# Patient Record
Sex: Male | Born: 1935 | Race: White | Hispanic: No | Marital: Married | Smoking: Former smoker
Health system: Southern US, Community
[De-identification: ages and names within clinical notes are randomized; demographics above are authoritative.]

## PROBLEM LIST (undated history)

## (undated) DIAGNOSIS — M1990 Unspecified osteoarthritis, unspecified site: Secondary | ICD-10-CM

## (undated) DIAGNOSIS — R0602 Shortness of breath: Secondary | ICD-10-CM

## (undated) DIAGNOSIS — I4510 Unspecified right bundle-branch block: Secondary | ICD-10-CM

## (undated) DIAGNOSIS — T7840XA Allergy, unspecified, initial encounter: Secondary | ICD-10-CM

## (undated) DIAGNOSIS — K589 Irritable bowel syndrome without diarrhea: Secondary | ICD-10-CM

## (undated) DIAGNOSIS — N400 Enlarged prostate without lower urinary tract symptoms: Secondary | ICD-10-CM

## (undated) DIAGNOSIS — J45909 Unspecified asthma, uncomplicated: Secondary | ICD-10-CM

## (undated) DIAGNOSIS — N509 Disorder of male genital organs, unspecified: Secondary | ICD-10-CM

## (undated) DIAGNOSIS — K219 Gastro-esophageal reflux disease without esophagitis: Secondary | ICD-10-CM

## (undated) DIAGNOSIS — J449 Chronic obstructive pulmonary disease, unspecified: Secondary | ICD-10-CM

## (undated) DIAGNOSIS — I10 Essential (primary) hypertension: Secondary | ICD-10-CM

## (undated) DIAGNOSIS — M069 Rheumatoid arthritis, unspecified: Secondary | ICD-10-CM

## (undated) DIAGNOSIS — Z87898 Personal history of other specified conditions: Secondary | ICD-10-CM

## (undated) HISTORY — DX: Allergy, unspecified, initial encounter: T78.40XA

## (undated) HISTORY — DX: Chronic obstructive pulmonary disease, unspecified: J44.9

## (undated) HISTORY — PX: TRANSURETHRAL RESECTION OF PROSTATE: SHX73

## (undated) HISTORY — DX: Irritable bowel syndrome, unspecified: K58.9

## (undated) HISTORY — PX: LUMBAR FUSION: SHX111

## (undated) HISTORY — DX: Essential (primary) hypertension: I10

## (undated) HISTORY — DX: Unspecified osteoarthritis, unspecified site: M19.90

## (undated) HISTORY — DX: Benign prostatic hyperplasia without lower urinary tract symptoms: N40.0

## (undated) HISTORY — PX: CARDIOVASCULAR STRESS TEST: SHX262

## (undated) HISTORY — DX: Unspecified asthma, uncomplicated: J45.909

## (undated) HISTORY — DX: Rheumatoid arthritis, unspecified: M06.9

## (undated) HISTORY — DX: Gastro-esophageal reflux disease without esophagitis: K21.9

---

## 2000-03-26 ENCOUNTER — Ambulatory Visit (HOSPITAL_COMMUNITY): Admission: RE | Admit: 2000-03-26 | Discharge: 2000-03-26 | Payer: Self-pay | Admitting: Internal Medicine

## 2000-03-26 ENCOUNTER — Encounter: Payer: Self-pay | Admitting: Internal Medicine

## 2004-08-09 ENCOUNTER — Ambulatory Visit (HOSPITAL_COMMUNITY): Admission: RE | Admit: 2004-08-09 | Discharge: 2004-08-09 | Payer: Self-pay | Admitting: *Deleted

## 2005-11-13 ENCOUNTER — Ambulatory Visit (HOSPITAL_COMMUNITY): Admission: RE | Admit: 2005-11-13 | Discharge: 2005-11-13 | Payer: Self-pay | Admitting: Internal Medicine

## 2008-07-08 ENCOUNTER — Ambulatory Visit (HOSPITAL_COMMUNITY): Admission: RE | Admit: 2008-07-08 | Discharge: 2008-07-08 | Payer: Self-pay | Admitting: Internal Medicine

## 2008-12-24 ENCOUNTER — Inpatient Hospital Stay (HOSPITAL_COMMUNITY): Admission: EM | Admit: 2008-12-24 | Discharge: 2008-12-27 | Payer: Self-pay | Admitting: Emergency Medicine

## 2009-09-28 LAB — HM COLONOSCOPY

## 2009-10-08 ENCOUNTER — Encounter: Admission: RE | Admit: 2009-10-08 | Discharge: 2009-10-08 | Payer: Self-pay | Admitting: Internal Medicine

## 2010-05-13 ENCOUNTER — Encounter
Admission: RE | Admit: 2010-05-13 | Discharge: 2010-05-13 | Payer: Self-pay | Source: Home / Self Care | Attending: Internal Medicine | Admitting: Internal Medicine

## 2010-08-21 LAB — BASIC METABOLIC PANEL
BUN: 15 mg/dL (ref 6–23)
CO2: 28 mEq/L (ref 19–32)
CO2: 29 mEq/L (ref 19–32)
Calcium: 8.5 mg/dL (ref 8.4–10.5)
Chloride: 105 mEq/L (ref 96–112)
Chloride: 107 mEq/L (ref 96–112)
Creatinine, Ser: 1.26 mg/dL (ref 0.4–1.5)
GFR calc Af Amer: >60 mL/min (ref >60)
GFR calc non Af Amer: 56 mL/min — ABNORMAL LOW (ref >60)
GFR calc non Af Amer: >60 mL/min (ref >60)
Glucose, Bld: 112 mg/dL — ABNORMAL HIGH (ref 70–99)
Glucose, Bld: 130 mg/dL — ABNORMAL HIGH (ref 70–99)
Glucose, Bld: 91 mg/dL (ref 70–99)
Potassium: 3.5 mEq/L (ref 3.5–5.1)
Potassium: 4.3 mEq/L (ref 3.5–5.1)
Sodium: 137 mEq/L (ref 135–145)

## 2010-08-21 LAB — URINALYSIS, ROUTINE W REFLEX MICROSCOPIC
Bilirubin Urine: NEGATIVE
Ketones, ur: NEGATIVE mg/dL
Nitrite: NEGATIVE
Nitrite: POSITIVE — AB
Protein, ur: >300 mg/dL — AB
Specific Gravity, Urine: 1.027 (ref 1.005–1.030)
Urobilinogen, UA: 0.2 mg/dL (ref 0.0–1.0)
Urobilinogen, UA: 1 mg/dL (ref 0.0–1.0)

## 2010-08-21 LAB — CBC
HCT: 32.2 % — ABNORMAL LOW (ref 39.0–52.0)
HCT: 33.2 % — ABNORMAL LOW (ref 39.0–52.0)
Hemoglobin: 11.5 g/dL — ABNORMAL LOW (ref 13.0–17.0)
MCHC: 34.4 g/dL (ref 30.0–36.0)
MCHC: 34.8 g/dL (ref 30.0–36.0)
MCV: 92.7 fL (ref 78.0–100.0)
MCV: 93.5 fL (ref 78.0–100.0)
Platelets: 197 10*3/uL (ref 150–400)
RDW: 14.6 % (ref 11.5–15.5)
RDW: 14.6 % (ref 11.5–15.5)
RDW: 14.8 % (ref 11.5–15.5)

## 2010-08-21 LAB — DIFFERENTIAL
Basophils Absolute: 0 10*3/uL (ref 0.0–0.1)
Basophils Absolute: 0.1 10*3/uL (ref 0.0–0.1)
Basophils Relative: 1 % (ref 0–1)
Eosinophils Relative: 0 % (ref 0–5)
Lymphocytes Relative: 13 % (ref 12–46)
Lymphocytes Relative: 7 % — ABNORMAL LOW (ref 12–46)
Monocytes Absolute: 0.8 10*3/uL (ref 0.1–1.0)
Neutro Abs: 15.1 10*3/uL — ABNORMAL HIGH (ref 1.7–7.7)
Neutrophils Relative %: 85 % — ABNORMAL HIGH (ref 43–77)

## 2010-08-21 LAB — IRON AND TIBC
Iron: 33 ug/dL — ABNORMAL LOW (ref 42–135)
TIBC: 221 ug/dL (ref 215–435)
UIBC: 188 ug/dL

## 2010-08-21 LAB — URINE CULTURE: Colony Count: >=100000

## 2010-08-21 LAB — CULTURE, BLOOD (ROUTINE X 2): Culture: NO GROWTH

## 2010-08-21 LAB — FERRITIN: Ferritin: 299 ng/mL (ref 22–322)

## 2010-08-21 LAB — URINE MICROSCOPIC-ADD ON

## 2010-08-21 LAB — RETICULOCYTES: RBC.: 3.37 MIL/uL — ABNORMAL LOW (ref 4.22–5.81)

## 2010-09-27 NOTE — Discharge Summary (Signed)
[name], [name]                 ACCOUNT NO.:  [id number]   MEDICAL RECORD NO.:  [id number]          PATIENT TYPE:  INP   LOCATION:  1512                         FACILITY:  [facility]   PHYSICIAN:  Ladell Pier, M.D.   DATE OF BIRTH:  1931-02-02   DATE OF ADMISSION:  12/24/2008  DATE OF DISCHARGE:  12/27/2008                               DISCHARGE SUMMARY   DISCHARGE DIAGNOSES:  1. Urinary tract infection.  2. Hypokalemia.  3. Hematuria.  4. Immunocompromised state on immunosuppressive therapy.  5. Hypertension.  6. Question of pneumonia.  7. Leukocytosis.  8. Anemia of chronic disease.  9. Rheumatoid arthritis.  10.Liver cysts.   DISCHARGE MEDICATIONS:  1. Levaquin 500 mg daily x6 days.  2. Methotrexate 10 mg every Monday.  3. Prednisone 10 mg daily.  4. Losartan 100 mg daily.  5. Terazosin 10 mg daily.  6. Folic acid 1 mg daily.  7. Vitamin D 50,000 international units Monday, Wednesday and Friday.  8. Finasteride 5 mg daily.   FOLLOWUP APPOINTMENTS:  The patient is to follow up with Dr. Oneta Rack in  1 week.   PROCEDURE:  None.   CONSULTANTS:  Dr. Aldean Ast, curbside consultant.   DIAGNOSTIC STUDIES:  CT scan of the abdomen and pelvis showed a region  of heterogenicity in the right lobe of the liver by ultrasound, showed  no abnormality on CT scan, findings presumably related to geographic  fatty infiltration, multiple cysts are present in the lateral segment of  the left lobe of the liver.  These are generally benign.  Given the  multitude of the cysts present as well as the size of the dominant cyst  it would be helpful to obtain at least a followup imaging study in 6-12  months.  Ultrasound of the kidneys showed normal renal ultrasound, no  hydronephrosis, nonspecific echogenic liver lesion could reflect focal  fat or hemangioma, neoplasm is not excluded.  Followup CT scan  recommended.  Chest x-ray done on August 12 showed interval development  of atelectasis or  infiltrate in the right middle lobe and the lingula.   HISTORY OF PRESENT ILLNESS:  The patient is a 77 year old white male  with a longstanding history of BPH who presented to the hospital with  fevers with temperatures up to 103 and chills for the past 2 days.  He  was noted to have some hematuria that morning.  The patient complains of  urinary frequency and decreased urine output for several days.  Please  see the admission note for the remainder of history done by Dr.  Ashley Royalty.  Past medical history, family history, social history, meds,  allergies, review of systems per admission H and P.   DISCHARGE PHYSICAL EXAMINATION:  VITAL SIGNS:  Temperature is 97.7,  pulse of 56, respirations 18, blood pressure 121/73, pulse ox 98% on  room air.  GENERAL:  The patient is sitting up in chair and does not seem to be in  any acute distress.  HEENT:  Head is normocephalic, atraumatic.  Pupils reactive to light.  Throat without erythema.  HEART:  Regular  rate and rhythm.  LUNGS:  Lungs are clear bilaterally.  No wheezes, rhonchi or rales.  ABDOMEN:  Soft, nontender and nondistended.  Positive bowel sounds.  EXTREMITIES:  Extremities without edema.   HOSPITAL COURSE:  1. Urinary tract infection.  The patient was admitted to the hospital.      He was started on IV Rocephin and Zithromax to cover both bladder      infection and pneumonia.  His fever resolved and he is at present      doing well.  He will follow up with Dr. Oneta Rack in the office at      some time after the clearing of the infection to repeat the      urinalysis.  His urine culture grew out Proteus mirabilis that is      sensitive to Levaquin and also sensitive to ceftriaxone with which      he was treated with.  2. Leukocytosis.  He did have leukocytosis on presentation that could      have been secondary to the prednisone and also the infection.      Leukocytosis is now resolved.  3. Anemia.  He does have anemia.  His lab  work showed that he does      have anemia of chronic disease.  4. Liver cysts.  He had some liver cysts that showed up on the      ultrasound and recommendation was for followup CT.  CT showed      multiple cysts and the recommendation is to follow up repeat CAT      scan in 6-12 months.  5. Hypertension.  He was continued on his home medications and his      blood pressure fairly well-controlled.  6. Hematuria.  He states that he does have a history of hematuria for      which he sees Dr. Aldean Ast.  He will follow up outpatient for the      hematuria at this time.  Since he does have the urinary tract      infection it is hard to tell whether the blood in the urine is      really because of bladder infection or from his chronic hematuria.  7. Pneumonia.  It does show possible pneumonia on his chest x-ray but      he does not presently have any respiratory symptoms but the      Levaquin also covers pneumonia and bladder infection.   DISCHARGE LABS:  UA too numerous to count white and red blood cells, a  large amount of blood and moderate leukocytes, sodium 141, potassium  3.5, chloride 107, CO2 28, glucose 91, BUN 15, creatinine 1.09, calcium  8.3, WBC 10.3, hemoglobin 11, platelets 197, MCV 93.5.  Anemia panel  retic count 30.3, iron 33, TIBC 221, percent sat 15.  Vitamin B12 level  462, folate greater than 20 and ferritin 299, blood cultures are  negative x2.      Ladell Pier, M.D.  Electronically Signed     NJ/MEDQ  D:  12/27/2008  T:  12/27/2008  Job:  [id number]   cc:   Lucky Cowboy, M.D.  Fax: [phone]

## 2010-09-27 NOTE — H&P (Signed)
NAMEJULES, [name]                 ACCOUNT NO.:  [id number]   MEDICAL RECORD NO.:  [id number]          PATIENT TYPE:  INP   LOCATION:  1512                         FACILITY:  [facility]   PHYSICIAN:  Altha Harm, MDDATE OF BIRTH:  26-Feb-1931   DATE OF ADMISSION:  12/24/2008  DATE OF DISCHARGE:                              HISTORY & PHYSICAL   CHIEF COMPLAINT:  Fever and chills x2 days.   HISTORY OF PRESENT ILLNESS:  This is a 77 year old gentleman with a  longstanding history of benign prostatic hypertrophy, who presents to  the hospital with fever, T-max of 103, and chills x2 days.  According to  the patient's family, he was also noted to have some hematuria this  morning.  The patient reports that he has been having frequency and  urgency and decreased urine output for several days.  He does not give a  history of decreased oral intake; however, his urine output has  decreased.  The patient denies any cough.  He denies any abdominal pain.  He denies any diarrhea, any constipation.  He denies any dizziness,  seizure activity, loss of consciousness, chest pain.  Of note, the  patient was recently started on methotrexate approximately 4 weeks ago  for treatment of his rheumatoid arthritis.  The patient has never had a  urinary tract infection in the past and this has been confirmed by his  urologist, Dr. Aldean Ast.   PAST MEDICAL HISTORY:  1. Significant for rheumatoid arthritis.  2. Benign prostatic hypertrophy, status post TURP.  3. Hypertension.   FAMILY HISTORY:  Significant for colon cancer in the father.   SOCIAL HISTORY:  The patient resides with his wife.  He is retired.  He  denies any tobacco, alcohol or drug use.   CURRENT MEDICATIONS:  1. Methotrexate 10 mg p.o. weekly on Mondays.  2. Prednisone 10 mg p.o. daily.  3. Losartan 100 mg p.o. daily.  4. Terazosin 10 mg p.o. daily.  5. Folic acid 1 mg p.o. daily.  6. Vitamin D 50,000 units Monday, Wednesday and  Friday.  7. Finasteride 5 mg p.o. daily.   ALLERGIES:  PENICILLIN.   PRIMARY CARE PHYSICIAN:  Dr. Oneta Rack.   UROLOGIST:  Dr. Aldean Ast.   RHEUMATOLOGIST:  Dr. Nickola Major.   REVIEW OF SYSTEMS:  Ten systems were reviewed.  All systems are negative  except as noted in the HPI.   Review of records:  I spoke with Dr. Aldean Ast, who reports that the  patient was seen in his office 2 weeks ago and had cytology done at that  time, which was negative.  He states that his hematuria is a  longstanding problem and has always been present.  He usually has about  10-15 rbc's in the urine.  He has not had a recent renal ultrasound  performed in the office.   Studies in the emergency room show the following:  White blood cell  count of 17.7, hemoglobin of 12.6, hematocrit of 36.6, platelet count of  219.  Sodium 137, potassium 3.4, chloride 104, bicarb 28, BUN 21,  creatinine 1.26.  Urinalysis shows positive nitrites, leukocytes, wbc's  too numerous to count, rbc's too numerous to count.   A chest x-ray 2-view shows interval development of atelectasis or  infiltrate in the right middle lobe and lingula.   PHYSICAL EXAMINATION:  On presentation to the emergency room, the  patient had a temperature of 99.2, a blood pressure of 98/61, a  respiratory rate of 18 and a heart rate of 68, O2 saturations of 99% on  room air.  Presently the vital signs are as follows.  Temperature of  98.5, heart rate 88, blood pressure 135/75, respiratory rate of 20, O2  saturations of 97% on room air.  GENERAL:  The patient is lying in bed.  He is a well-nourished, well-  developed gentleman in no acute distress.  HEENT EXAMINATION:  He is normocephalic, atraumatic.  Pupils are equally  round and reactive to light and accommodation.  Extraocular movements  are intact.  Oropharynx is moist.  No exudate, erythema or lesions are  noted.  NECK EXAMINATION:  Trachea is midline.  No masses, no thyromegaly, no  JVD, no  carotid bruit.  RESPIRATORY EXAMINATION:  The patient has a normal respiratory effort.  He has got equal excursion bilaterally.  No wheezing or rhonchi are  noted.  He is clear to auscultation.  No increased vocal fremitus.  He  is resonant to percussion.  CARDIOVASCULAR:  He has got a normal S1 and S2.  No murmurs, rubs or  gallops are noted.  PMI is nondisplaced.  No heaves or thrills on  palpation.  ABDOMINAL EXAMINATION:  The abdomen is obese, protuberant.  He has  normoactive bowel sounds.  Abdomen is nontender, nondistended.  No  masses, no hepatosplenomegaly noted.  He has got no CVA tenderness noted  LYMPH NODE SURGERY:  He has got no cervical, axillary or inguinal  lymphadenopathy noted.  MUSCULOSKELETAL:  He has got no warmth, swelling or erythema around the  joints.  NEUROLOGICAL:  He has got cranial nerves II-XII grossly intact.  No  focal neurological deficits noted.  PSYCHIATRIC:  He is alert and oriented x3.  He has got normal cognition,  good insight.  He has got good remote recall.  He has got very mild  impairment of recent recall.   ASSESSMENT/PLAN:  This is a patient who presents with:  1. A urinary tract infection with sepsis associated with the urinary      tract infection.  There is a questionable pneumonia versus      atelectasis.  Clinically the patient does not present as a      pneumonia; however, that will be evaluated as the patient continues      his current therapy.  2. Hypokalemia.  This will be replaced.  3. Hematuria.  As noted before in consultation with Dr. Aldean Ast, the      patient does have microscopic hematuria.  However, in the setting      of a urinary tract infection, it can certainly increase the amount      of red blood cells seen in the urine.  The patient has had a recent      cytology less than 2 weeks ago which is negative.  The plan in      light of the urinary tract infection and decreasing output and the      hematuria, I will go  ahead and get a renal ultrasound for this      patient.  We will evaluate for any  evidence of hydronephrosis or      if, in fact, his decreased urine output is secondary to decreased      volume status.  In terms of his urinary tract infection, the      patient is presently on antibiotics, Rocephin and azithromycin, to      also cover pneumonia.  The patient does have an allergy to      PENICILLIN, and there is some cross-reactivity between PENICILLIN      and Rocephin.  I will go ahead leave the patient on Rocephin as he      appears to be tolerating it without any sequelae.  If he does start      to develop any symptoms, it will be prudent to change him to an      alternate antibiotic, possibly ciprofloxacin.  In the meantime, we      will await the results of the cultures and proceed with tapering      antibiotics as appropriate.  Based upon the findings of the renal      ultrasound and based upon the patient's urine output, we will make      a determination as to whether or not a Foley needs to be placed.      However, at this time a Foley is not in place and I do not intend      to do one unless there is an indication to do so.  In terms of his      hypokalemia, his potassium will be replaced.  4. Immunocompromised state.  The patient was recently started on      methotrexate and likely put him in an immunocompromised state, thus      to predispose him to infection.  He has already received his      methotrexate for this week.  However, if he is in the hospital      beyond next Monday, it would be prudent to hold the methotrexate      until he is recovered from his illness and then restart it once he      is in a convalescent state.  In terms of the prednisone, that will      be continued while hospitalized.  5. Hypertension.  The patient be continued on his Cozaar.  His blood      pressure is normotensive at present.  I have discussed with Dr.      Aldean Ast and I have also  discussed it with the patient's daughter,      Dulcy Fanny, a nurse in the hospital.   TOTAL TIME FOR THIS PROCESS:  1 hour.      Altha Harm, MD  Electronically Signed     MAM/MEDQ  D:  12/24/2008  T:  12/24/2008  Job:  [id number]

## 2010-09-30 NOTE — Op Note (Signed)
[name], [name]                 ACCOUNT NO.:  [id number]   MEDICAL RECORD NO.:  [id number]          PATIENT TYPE:  AMB   LOCATION:  ENDO                         FACILITY:  [facility]   PHYSICIAN:  John C. Madilyn Fireman, M.D.    DATE OF BIRTH:  [birth year]   DATE OF PROCEDURE:  08/09/2004  DATE OF DISCHARGE:                                 OPERATIVE REPORT   PROCEDURE:  Esophagogastroduodenoscopy with esophageal dilatation.   INDICATIONS FOR PROCEDURE:  Intermittent solid food dysphagia in a patient  who is also undergoing colonoscopy due to the family history of colon cancer  in a first-degree relative.   PROCEDURE:  The patient was placed in the left lateral decubitus position  and placed on the pulse monitor with continuous low-flow oxygen delivered by  nasal cannula.  He was sedated with 50 mcg IV fentanyl and 7.5 mg IV Versed.  The Olympus video endoscope was advanced under direct vision into the  oropharynx and esophagus.  The esophagus was straight and of normal caliber  with the squamocolumnar line at 38 cm.  There was no visible hiatal hernia,  ring stricture or other abnormality of the GE junction.  The stomach was  entered without resistance and a small amount of liquid secretions were  suctioned from the fundus.  Retroflexed view of the cardia was unremarkable.  The fundus, body, antrum and pylorus all appeared normal.  The duodenum was  entered and both the bulb and second portion were well-inspected and  appeared be within normal limits.  The Savary guidewire was passed through  the endoscope channel and the scope withdrawn.  Under fluoroscopic  visualization an 18 mm Savary dilator was passed with minimal resistance and  no blood seen on withdrawal.  The dilator was removed together with wire and  the patient prepared for colonoscopy.  He tolerated the procedure well, and  there were no immediate complications.   IMPRESSION:  Normal endoscopy with no visible esophageal ring or  stricture,  status post empiric dilatation due to his complaints of dysphagia.   PLAN:  1.  Advance diet and observe response to dilatation.  2.  A proceed colonoscopy.      JCH/MEDQ  D:  08/09/2004  T:  08/09/2004  Job:  [id number]   cc:   Lucky Cowboy, M.D.  [address]  [place], [place] [address]  Fax: [phone]

## 2010-09-30 NOTE — Op Note (Signed)
NAMERANVIR, RENOVATO                 ACCOUNT NO.:  [id number]   MEDICAL RECORD NO.:  [id number]          PATIENT TYPE:  AMB   LOCATION:  ENDO                         FACILITY:  [facility]   PHYSICIAN:  John C. Madilyn Fireman, M.D.    DATE OF BIRTH:  May 28, 1930   DATE OF PROCEDURE:  08/09/2004  DATE OF DISCHARGE:                                 OPERATIVE REPORT   PROCEDURE:  Colonoscopy with polypectomy   INDICATIONS FOR PROCEDURE:  Family history of colon cancer in a first-degree  relative.   DESCRIPTION OF PROCEDURE:  The patient was placed in the left lateral  decubitus position and placed on the pulse monitor with continuous low-flow  oxygen delivered by nasal cannula. He was sedated with 50 mcg IV Fentanyl  and 7.5 mg IV Versed for previous EGD; and no further sedation was required  for this procedure.   The Olympus video colonoscope was inserted into the rectum and advanced to  cecum, confirmed by transillumination of McBurney's point and visualization  of ileocecal valve and appendiceal orifice. The prep was fairly good, but  suboptimal in some locations particularly the transverse and descending  colon. I could not rule out small lesions less than 1 cm in all areas;  otherwise, there was a 8-mm, pedunculated polyp in the base of the cecum  which was removed by snare.  I lost position during snaring the polyp and  the polyp was lost to visualization and subsequent retrieval, despite  vigorous lavage and suctioning of the fluid in the surrounding areas of the  lumen.  The polypectomy site was well inspected and appeared to be free of  any residual polyp.   The remainder of the cecum, ascending, transverse, descending sigmoid, and  rectum appeared normal with no further polyps, masses, diverticula, or other  mucosal abnormalities. The rectum likewise appeared normal.  On retroflexed  view the anus revealed no obvious internal hemorrhoids. The scope was then  withdrawn and the patient  returned to the recovery room in stable condition.  He tolerated the procedure well; and there were no immediate complications.   IMPRESSION:  Cecal polyp, otherwise normal study.   PLAN:  Will repeat colonoscopy in 5 years.      JCH/MEDQ  D:  08/09/2004  T:  08/09/2004  Job:  [id number]   cc:   Lucky Cowboy, M.D.  [address]  [place], [place] [address]  Fax: [phone]

## 2011-03-30 ENCOUNTER — Ambulatory Visit (HOSPITAL_COMMUNITY)
Admission: RE | Admit: 2011-03-30 | Discharge: 2011-03-30 | Disposition: A | Discharge status: Home / Self Care | Payer: Medicare Other | Source: Ambulatory Visit | Attending: Internal Medicine | Admitting: Internal Medicine

## 2011-03-30 ENCOUNTER — Other Ambulatory Visit (HOSPITAL_COMMUNITY): Payer: Self-pay | Admitting: Internal Medicine

## 2011-03-30 DIAGNOSIS — I10 Essential (primary) hypertension: Secondary | ICD-10-CM

## 2011-03-30 DIAGNOSIS — J449 Chronic obstructive pulmonary disease, unspecified: Secondary | ICD-10-CM

## 2011-03-30 DIAGNOSIS — J4489 Other specified chronic obstructive pulmonary disease: Secondary | ICD-10-CM | POA: Insufficient documentation

## 2011-03-30 DIAGNOSIS — R0602 Shortness of breath: Secondary | ICD-10-CM | POA: Insufficient documentation

## 2011-04-20 ENCOUNTER — Ambulatory Visit (INDEPENDENT_AMBULATORY_CARE_PROVIDER_SITE_OTHER): Payer: Medicare Other | Admitting: Cardiology

## 2011-04-20 ENCOUNTER — Encounter: Payer: Self-pay | Admitting: Cardiology

## 2011-04-20 VITALS — BP 118/68 | HR 88 | Ht 76.0 in | Wt 262.0 lb

## 2011-04-20 DIAGNOSIS — R002 Palpitations: Secondary | ICD-10-CM

## 2011-04-20 DIAGNOSIS — R0600 Dyspnea, unspecified: Secondary | ICD-10-CM

## 2011-04-20 DIAGNOSIS — R0609 Other forms of dyspnea: Secondary | ICD-10-CM

## 2011-04-20 DIAGNOSIS — R0989 Other specified symptoms and signs involving the circulatory and respiratory systems: Secondary | ICD-10-CM

## 2011-04-20 DIAGNOSIS — M069 Rheumatoid arthritis, unspecified: Secondary | ICD-10-CM

## 2011-04-20 DIAGNOSIS — I119 Hypertensive heart disease without heart failure: Secondary | ICD-10-CM

## 2011-04-20 NOTE — Assessment & Plan Note (Signed)
The blood pressure has been adequately controlled on his current regimen.

## 2011-04-20 NOTE — Assessment & Plan Note (Signed)
The patient has exertional dyspnea relieved by rest.  He is not having any significant cough or sputum production.  We checked his oxygen saturation at rest today and it was normal at 93% on room air.  He had a recent chest x-ray on 03/30/11 which showed stable changes of COPD but no new focal findings and the heart size was at the upper limits of normal.

## 2011-04-20 NOTE — Assessment & Plan Note (Signed)
Patient is aware of rapid forceful heart action which accompanies his exertional dyspnea and subsides with rest.

## 2011-04-20 NOTE — Patient Instructions (Signed)
Your physician recommends that you continue on your current medications as directed. Please refer to the Current Medication list given to you today.  Your physician has requested that you have an echocardiogram. Echocardiography is a painless test that uses sound waves to create images of your heart. It provides your doctor with information about the size and shape of your heart and how well your heart's chambers and valves are working. This procedure takes approximately one hour. There are no restrictions for this procedure.  Your physician has requested that you have en exercise stress myoview. For further information please visit [email or web address]. Please follow instruction sheet, as given.   

## 2011-04-20 NOTE — Progress Notes (Signed)
[name] Date of Birth:  [birth year] [facility] / [facility] [address]   [address] [place], [place]  [address] [phone]           Fax   [phone]  History of Present Illness: This pleasant 77 year old gentleman is seen at the request of Dr. Oneta Rack for evaluation of dyspnea.  He had seen the patient in more than 10 years ago in our office but those records are not able to be found today.  We both recalled that he had a treadmill stress test and he recalls being told that it was normal at that time.  Recently the patient has been more short of breath and has become concerned.  He has also been experiencing palpitations.  He's had lightheadedness at times.  He denies any chest pain.  His dyspnea has gradually worsened since his retirement.  Over that period of time he has gained about 15 pounds.  He is a nonsmoker having quit 30 years ago.  He does not consume any alcohol.  He has not been experiencing any symptoms of overt congestive heart failure such as orthopnea or paroxysmal nocturnal dyspnea.  The symptoms are mainly with exertion.  He estimates that he is able to climb 2 flights of stairs but would be quite short of breath at the top and his heart would be racing.  Likewise he would be able to walk a city block but would be tired at the end of one block.  His past medical history reveals that he had a history of high blood pressure and a history of rheumatoid arthritis.  Dr. Lendon Colonel is his rheumatologist. His past surgical history reveals that he has a past history of BPH and several years ago had a TURP by Dr. Aldean Ast.  His family history reveals that his father died at age 78 of heart trouble and his mother lived to be 44  Current Outpatient Prescriptions  Medication Sig Dispense Refill  . Cholecalciferol ([employer] VITAMIN D PO) Take by mouth. Taking 50,000 units 4 times a week       . finasteride (PROSCAR) 5 MG tablet Take 5 mg by mouth daily.        . folic acid  (FOLVITE) 1 MG tablet Take 1 mg by mouth daily.        Marland Kitchen losartan (COZAAR) 100 MG tablet Take 100 mg by mouth daily.        . methotrexate (RHEUMATREX) 2.5 MG tablet Take 2.5 mg by mouth once a week. Taking 6 tab weekly       . Multiple Vitamin (MULTIVITAMIN) tablet Take 1 tablet by mouth daily.        . predniSONE (DELTASONE) 5 MG tablet Take 5 mg by mouth daily.        Marland Kitchen terazosin (HYTRIN) 10 MG capsule Take 10 mg by mouth at bedtime.          Allergies  Allergen Reactions  . Penicillins     Patient Active Problem List  Diagnoses  . Dyspnea  . Benign hypertensive heart disease without heart failure  . Palpitation  . Rheumatoid arthritis    History  Smoking status  . Former Smoker  Smokeless tobacco  . Not on file    History  Alcohol Use: Not on file    No family history on file.  Review of Systems: Constitutional: no fever chills diaphoresis or fatigue or change in weight.  Head and neck: no hearing loss, no epistaxis,  no photophobia or visual disturbance. Respiratory: No cough, shortness of breath or wheezing. Cardiovascular: No chest pain peripheral edema, palpitations. Gastrointestinal: No abdominal distention, no abdominal pain, no change in bowel habits hematochezia or melena. Genitourinary: No dysuria, no frequency, no urgency, no nocturia. Musculoskeletal:No arthralgias, no back pain, no gait disturbance or myalgias. Neurological: No dizziness, no headaches, no numbness, no seizures, no syncope, no weakness, no tremors. Hematologic: No lymphadenopathy, no easy bruising. Psychiatric: No confusion, no hallucinations, no sleep disturbance.    Physical Exam: Filed Vitals:   04/20/11 1457  BP: 118/68  Pulse: 88   the general appearance reveals a large gentleman in no distress.Pupils equal and reactive.   Extraocular Movements are full.  There is no scleral icterus.  The mouth and pharynx are normal.  The neck is supple.  The carotids reveal no bruits.  The  jugular venous pressure is normal.  The thyroid is not enlarged.  There is no lymphadenopathy.  The chest is clear to percussion and auscultation. There are no rales or rhonchi. Expansion of the chest is symmetrical.  The precordium is quiet.  The first heart sound is normal.  The second heart sound is physiologically split.  There is no murmur gallop rub or click.  There is no abnormal lift or heave.  The abdomen is soft and nontender. Bowel sounds are normal. The liver and spleen are not enlarged. There Are no abdominal masses. There are no bruits.  The pedal pulses are good.  There is no phlebitis or edema.  There is no cyanosis or clubbing. Strength is normal and symmetrical in all extremities.  There is no lateralizing weakness.  There are no sensory deficits.  The skin is warm and dry.  There is no rash.    Assessment / Plan: We reviewed his recent electrocardiogram which shows normal sinus rhythm and a right bundle branch block pattern but no ischemic changes at rest. The etiology of his exertional dyspnea is not presently clear.  He does have rheumatoid arthritis and may have some cardiac involvement from that.  We will have him return for a two-dimensional echocardiogram.  We'll also have him return for a treadmill Myoview stress test to evaluate his exertional dyspnea further.  Many thanks for the opportunity to see this pleasant gentleman with you again  [name]

## 2011-04-21 ENCOUNTER — Encounter: Payer: Self-pay | Admitting: Cardiology

## 2011-04-21 ENCOUNTER — Encounter: Payer: Self-pay | Admitting: *Deleted

## 2011-04-26 ENCOUNTER — Ambulatory Visit (HOSPITAL_BASED_OUTPATIENT_CLINIC_OR_DEPARTMENT_OTHER): Payer: Medicare Other | Admitting: Radiology

## 2011-04-26 ENCOUNTER — Ambulatory Visit (HOSPITAL_COMMUNITY): Payer: Medicare Other | Attending: Internal Medicine

## 2011-04-26 DIAGNOSIS — I079 Rheumatic tricuspid valve disease, unspecified: Secondary | ICD-10-CM | POA: Insufficient documentation

## 2011-04-26 DIAGNOSIS — R0989 Other specified symptoms and signs involving the circulatory and respiratory systems: Secondary | ICD-10-CM | POA: Insufficient documentation

## 2011-04-26 DIAGNOSIS — M069 Rheumatoid arthritis, unspecified: Secondary | ICD-10-CM

## 2011-04-26 DIAGNOSIS — I119 Hypertensive heart disease without heart failure: Secondary | ICD-10-CM

## 2011-04-26 DIAGNOSIS — I4949 Other premature depolarization: Secondary | ICD-10-CM

## 2011-04-26 DIAGNOSIS — I4510 Unspecified right bundle-branch block: Secondary | ICD-10-CM

## 2011-04-26 DIAGNOSIS — I059 Rheumatic mitral valve disease, unspecified: Secondary | ICD-10-CM | POA: Insufficient documentation

## 2011-04-26 DIAGNOSIS — R002 Palpitations: Secondary | ICD-10-CM

## 2011-04-26 DIAGNOSIS — R0600 Dyspnea, unspecified: Secondary | ICD-10-CM

## 2011-04-26 DIAGNOSIS — R0609 Other forms of dyspnea: Secondary | ICD-10-CM | POA: Insufficient documentation

## 2011-04-26 HISTORY — PX: TRANSTHORACIC ECHOCARDIOGRAM: SHX275

## 2011-04-26 MED ORDER — TECHNETIUM TC 99M TETROFOSMIN IV KIT
10.0000 | PACK | Freq: Once | INTRAVENOUS | Status: AC | PRN
  Administered 2011-04-26: 10 via INTRAVENOUS

## 2011-04-26 MED ORDER — TECHNETIUM TC 99M TETROFOSMIN IV KIT
30.0000 | PACK | Freq: Once | INTRAVENOUS | Status: AC | PRN
  Administered 2011-04-26: 30 via INTRAVENOUS

## 2011-04-26 NOTE — Progress Notes (Signed)
[facility] SITE 3 NUCLEAR MED [address] [place] [address] [phone]  Cardiology Nuclear Med Study  [name] is a 77 y.o. male [id number] June 20, 1930   Nuclear Med Background Indication for Stress Test:  Evaluation for Ischemia History:  COPD and GXT -NL Cardiac Risk Factors: History of Smoking, Hypertension and RBBB  Symptoms:  Dizziness, DOE, Fatigue, Light-Headedness and Palpitations   Nuclear Pre-Procedure Caffeine/Decaff Intake:  None NPO After: 9:00pm   Lungs:  clear IV 0.9% NS with Angio Cath:  20g  IV Site: R Antecubital  IV Started by:  Bonnita Levan, RN  Chest Size (in):  48 Cup Size: n/a  Height: 6\' 4"  (1.93 m)  Weight:  264 lb (119.75 kg)  BMI:  Body mass index is 32.14 kg/(m^2). Tech Comments:  N/A    Nuclear Med Study 1 or 2 day study: 1 day  Stress Test Type:  Stress  Reading MD: Kristeen Miss, MD  Order Authorizing Provider:  [name], MD  Resting Radionuclide: Technetium 67m Tetrofosmin  Resting Radionuclide Dose: 11 mCi   Stress Radionuclide:  Technetium 60m Tetrofosmin  Stress Radionuclide Dose: 33 mCi           Stress Protocol Rest HR: 54 Stress HR: 133  Rest BP: 112/60 Stress BP: 161/56  Exercise Time (min): 5:45 mins METS: 7.00   Predicted Max HR: 142 bpm % Max HR: 93.66 bpm Rate Pressure Product: 21308   Dose of Adenosine (mg):  n/a Dose of Lexiscan: n/a mg  Dose of Atropine (mg): n/a Dose of Dobutamine: n/a mcg/kg/min (at max HR)  Stress Test Technologist: Frederick Peers, EMT-P  Nuclear Technologist:  Domenic Polite, CNMT     Rest Procedure:  Myocardial perfusion imaging was performed at rest 45 minutes following the intravenous administration of Technetium 87m Tetrofosmin. Rest ECG: NSR-RBBB  Stress Procedure:  The patient exercised for 5:24mins.  The patient stopped due to SOB and denied any chest pain.  There were non specific ST-T wave changes/PVCs.  Technetium 38m Tetrofosmin was injected at  peak exercise and myocardial perfusion imaging was performed after a brief delay. Stress ECG: No significant change from baseline ECG  QPS Raw Data Images:  Normal; no motion artifact; normal heart/lung ratio. Stress Images:  Mildly decreased uptake in the base of the inferior wall Rest Images:  Moderately decreased uptake in the base to mid inferior wall Subtraction (SDS):  There is decreased uptake in the inferior wall which is worse on the rest images and improves with stress. Suspect this is soft-tissue attenuation. No evidence ischemia.  Transient Ischemic Dilatation (Normal <1.22):  .87 Lung/Heart Ratio (Normal <0.45):  43  Quantitative Gated Spect Images QGS EDV:  105 ml QGS ESV:  40 ml QGS cine images:  NL LV Function; NL Wall Motion QGS EF: 62%  Impression Exercise Capacity:  Fair exercise capacity. BP Response:  Normal blood pressure response. Clinical Symptoms:  There is dyspnea. ECG Impression:  No significant ST segment change suggestive of ischemia. Comparison with Prior Nuclear Study: No images to compare  Overall Impression:  Low risk stress nuclear study. There is decreased uptake in the inferior wall which is worse on the rest images and improves with stress. Suspect this is soft-tissue attenuation. No evidence ischemia.   Truman Hayward 6:13 PM

## 2011-04-27 ENCOUNTER — Telehealth: Payer: Self-pay | Admitting: *Deleted

## 2011-04-27 NOTE — Telephone Encounter (Signed)
Advised of results

## 2011-04-27 NOTE — Telephone Encounter (Signed)
Message copied by Burnell Blanks on Thu Apr 27, 2011 10:16 AM ------      Message from: [name]      Created: Wed Apr 26, 2011  7:14 PM       Please report.  The echocardiogram was satisfactory.  The left ventricular function is good.  There is mild diastolic dysfunction and mild mitral regurgitation.  Continue same medication.

## 2011-04-27 NOTE — Telephone Encounter (Signed)
Message copied by Burnell Blanks on Thu Apr 27, 2011 10:15 AM ------      Message from: [name]      Created: Thu Apr 27, 2011  9:22 AM       Please report.  The stress test was normal.  No evidence of ischemia.  Left ventricular function is good.

## 2011-07-04 ENCOUNTER — Other Ambulatory Visit (HOSPITAL_COMMUNITY): Payer: Self-pay | Admitting: Internal Medicine

## 2011-07-04 DIAGNOSIS — J449 Chronic obstructive pulmonary disease, unspecified: Secondary | ICD-10-CM

## 2011-07-13 ENCOUNTER — Inpatient Hospital Stay (HOSPITAL_COMMUNITY)
Admission: RE | Admit: 2011-07-13 | Discharge: 2011-07-13 | Disposition: A | Discharge status: Home / Self Care | Payer: Medicare Other | Source: Ambulatory Visit | Attending: Internal Medicine | Admitting: Internal Medicine

## 2011-07-13 DIAGNOSIS — M069 Rheumatoid arthritis, unspecified: Secondary | ICD-10-CM | POA: Insufficient documentation

## 2011-07-13 DIAGNOSIS — Z880 Allergy status to penicillin: Secondary | ICD-10-CM | POA: Insufficient documentation

## 2011-07-13 DIAGNOSIS — R0989 Other specified symptoms and signs involving the circulatory and respiratory systems: Secondary | ICD-10-CM | POA: Insufficient documentation

## 2011-07-13 DIAGNOSIS — Z87891 Personal history of nicotine dependence: Secondary | ICD-10-CM | POA: Insufficient documentation

## 2011-07-13 DIAGNOSIS — R0609 Other forms of dyspnea: Secondary | ICD-10-CM | POA: Insufficient documentation

## 2011-07-13 DIAGNOSIS — R002 Palpitations: Secondary | ICD-10-CM | POA: Insufficient documentation

## 2011-07-13 DIAGNOSIS — J449 Chronic obstructive pulmonary disease, unspecified: Secondary | ICD-10-CM | POA: Insufficient documentation

## 2011-07-13 DIAGNOSIS — I119 Hypertensive heart disease without heart failure: Secondary | ICD-10-CM | POA: Insufficient documentation

## 2011-07-13 DIAGNOSIS — J4489 Other specified chronic obstructive pulmonary disease: Secondary | ICD-10-CM | POA: Insufficient documentation

## 2012-04-04 ENCOUNTER — Emergency Department (HOSPITAL_COMMUNITY): Payer: Medicare Other

## 2012-04-04 ENCOUNTER — Emergency Department (HOSPITAL_COMMUNITY)
Admission: EM | Admit: 2012-04-04 | Discharge: 2012-04-04 | Disposition: A | Discharge status: Home / Self Care | Payer: Medicare Other | Attending: Emergency Medicine | Admitting: Emergency Medicine

## 2012-04-04 ENCOUNTER — Encounter (HOSPITAL_COMMUNITY): Payer: Self-pay | Admitting: *Deleted

## 2012-04-04 DIAGNOSIS — S46909A Unspecified injury of unspecified muscle, fascia and tendon at shoulder and upper arm level, unspecified arm, initial encounter: Secondary | ICD-10-CM | POA: Insufficient documentation

## 2012-04-04 DIAGNOSIS — S0081XA Abrasion of other part of head, initial encounter: Secondary | ICD-10-CM

## 2012-04-04 DIAGNOSIS — Z79899 Other long term (current) drug therapy: Secondary | ICD-10-CM | POA: Insufficient documentation

## 2012-04-04 DIAGNOSIS — Y929 Unspecified place or not applicable: Secondary | ICD-10-CM | POA: Insufficient documentation

## 2012-04-04 DIAGNOSIS — S0990XA Unspecified injury of head, initial encounter: Secondary | ICD-10-CM

## 2012-04-04 DIAGNOSIS — Z87448 Personal history of other diseases of urinary system: Secondary | ICD-10-CM | POA: Insufficient documentation

## 2012-04-04 DIAGNOSIS — I10 Essential (primary) hypertension: Secondary | ICD-10-CM | POA: Insufficient documentation

## 2012-04-04 DIAGNOSIS — Z87891 Personal history of nicotine dependence: Secondary | ICD-10-CM | POA: Insufficient documentation

## 2012-04-04 DIAGNOSIS — S060X1A Concussion with loss of consciousness of 30 minutes or less, initial encounter: Secondary | ICD-10-CM | POA: Insufficient documentation

## 2012-04-04 DIAGNOSIS — IMO0002 Reserved for concepts with insufficient information to code with codable children: Secondary | ICD-10-CM | POA: Insufficient documentation

## 2012-04-04 DIAGNOSIS — Y9389 Activity, other specified: Secondary | ICD-10-CM | POA: Insufficient documentation

## 2012-04-04 DIAGNOSIS — S4980XA Other specified injuries of shoulder and upper arm, unspecified arm, initial encounter: Secondary | ICD-10-CM | POA: Insufficient documentation

## 2012-04-04 DIAGNOSIS — M069 Rheumatoid arthritis, unspecified: Secondary | ICD-10-CM | POA: Insufficient documentation

## 2012-04-04 DIAGNOSIS — W1801XA Striking against sports equipment with subsequent fall, initial encounter: Secondary | ICD-10-CM | POA: Insufficient documentation
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
## 2012-04-04 NOTE — ED Notes (Signed)
Patient ambulatory to CT

## 2012-04-04 NOTE — ED Notes (Signed)
Patient transported returned from  CT

## 2012-07-02 ENCOUNTER — Other Ambulatory Visit: Payer: Self-pay | Admitting: Urology

## 2012-07-03 ENCOUNTER — Other Ambulatory Visit (HOSPITAL_COMMUNITY): Payer: Self-pay | Admitting: Urology

## 2012-07-03 ENCOUNTER — Other Ambulatory Visit: Payer: Self-pay | Admitting: Urology

## 2012-07-03 DIAGNOSIS — L729 Follicular cyst of the skin and subcutaneous tissue, unspecified: Secondary | ICD-10-CM

## 2012-07-03 DIAGNOSIS — D4959 Neoplasm of unspecified behavior of other genitourinary organ: Secondary | ICD-10-CM

## 2012-07-05 ENCOUNTER — Ambulatory Visit (HOSPITAL_COMMUNITY)
Admission: RE | Admit: 2012-07-05 | Discharge: 2012-07-05 | Disposition: A | Discharge status: Home / Self Care | Payer: Medicare Other | Source: Ambulatory Visit | Attending: Urology | Admitting: Urology

## 2012-07-05 DIAGNOSIS — N433 Hydrocele, unspecified: Secondary | ICD-10-CM | POA: Insufficient documentation

## 2012-07-05 DIAGNOSIS — D4959 Neoplasm of unspecified behavior of other genitourinary organ: Secondary | ICD-10-CM

## 2012-07-05 DIAGNOSIS — N508 Other specified disorders of male genital organs: Secondary | ICD-10-CM | POA: Insufficient documentation

## 2012-07-05 DIAGNOSIS — L729 Follicular cyst of the skin and subcutaneous tissue, unspecified: Secondary | ICD-10-CM

## 2012-07-11 ENCOUNTER — Encounter (HOSPITAL_BASED_OUTPATIENT_CLINIC_OR_DEPARTMENT_OTHER): Payer: Self-pay | Admitting: *Deleted

## 2012-07-11 NOTE — Progress Notes (Signed)
NPO AFTER MN. ARRIVES AT 0830. NEEDS ISTAT AND EKG. WILL TAKE PREDNISONE AM OF SURG W/ SIP OF WATER.

## 2012-07-17 ENCOUNTER — Ambulatory Visit (HOSPITAL_BASED_OUTPATIENT_CLINIC_OR_DEPARTMENT_OTHER)
Admission: RE | Admit: 2012-07-17 | Discharge: 2012-07-17 | Disposition: A | Discharge status: Home / Self Care | Payer: Medicare Other | Source: Ambulatory Visit | Attending: Urology | Admitting: Urology

## 2012-07-17 ENCOUNTER — Encounter (HOSPITAL_BASED_OUTPATIENT_CLINIC_OR_DEPARTMENT_OTHER): Payer: Self-pay | Admitting: Certified Registered"

## 2012-07-17 ENCOUNTER — Encounter (HOSPITAL_BASED_OUTPATIENT_CLINIC_OR_DEPARTMENT_OTHER): Payer: Self-pay | Admitting: Anesthesiology

## 2012-07-17 ENCOUNTER — Encounter (HOSPITAL_BASED_OUTPATIENT_CLINIC_OR_DEPARTMENT_OTHER)
Admission: RE | Disposition: A | Discharge status: Home / Self Care | Payer: Self-pay | Source: Ambulatory Visit | Attending: Urology

## 2012-07-17 ENCOUNTER — Ambulatory Visit (HOSPITAL_BASED_OUTPATIENT_CLINIC_OR_DEPARTMENT_OTHER): Payer: Medicare Other | Admitting: Anesthesiology

## 2012-07-17 DIAGNOSIS — I10 Essential (primary) hypertension: Secondary | ICD-10-CM | POA: Insufficient documentation

## 2012-07-17 DIAGNOSIS — I4510 Unspecified right bundle-branch block: Secondary | ICD-10-CM | POA: Insufficient documentation

## 2012-07-17 DIAGNOSIS — N509 Disorder of male genital organs, unspecified: Secondary | ICD-10-CM

## 2012-07-17 DIAGNOSIS — M069 Rheumatoid arthritis, unspecified: Secondary | ICD-10-CM | POA: Insufficient documentation

## 2012-07-17 DIAGNOSIS — N400 Enlarged prostate without lower urinary tract symptoms: Secondary | ICD-10-CM | POA: Insufficient documentation

## 2012-07-17 DIAGNOSIS — C632 Malignant neoplasm of scrotum: Secondary | ICD-10-CM | POA: Insufficient documentation

## 2012-07-17 HISTORY — DX: Personal history of other specified conditions: Z87.898

## 2012-07-17 HISTORY — DX: Shortness of breath: R06.02

## 2012-07-17 HISTORY — DX: Unspecified right bundle-branch block: I45.10

## 2012-07-17 HISTORY — PX: SCROTAL EXPLORATION: SHX2386

## 2012-07-17 HISTORY — DX: Disorder of male genital organs, unspecified: N50.9

## 2012-07-17 LAB — POCT I-STAT 4, (NA,K, GLUC, HGB,HCT)
HCT: 39 % (ref 39.0–52.0)
Hemoglobin: 13.3 g/dL (ref 13.0–17.0)

## 2012-07-17 SURGERY — EXPLORATION, SCROTUM
Anesthesia: Monitor Anesthesia Care | Site: Scrotum | Laterality: Left | Wound class: Clean Contaminated

## 2012-07-17 MED ORDER — PROPOFOL 10 MG/ML IV EMUL
INTRAVENOUS | Status: DC | PRN
  Administered 2012-07-17: 75 ug/kg/min via INTRAVENOUS

## 2012-07-17 MED ORDER — BACITRACIN-NEOMYCIN-POLYMYXIN 400-5-5000 EX OINT: TOPICAL_OINTMENT | Freq: Three times a day (TID) | CUTANEOUS | Status: DC

## 2012-07-17 MED ORDER — BACITRACIN-NEOMYCIN-POLYMYXIN OINTMENT TUBE
TOPICAL_OINTMENT | CUTANEOUS | Status: DC | PRN
  Administered 2012-07-17: 1 via TOPICAL

## 2012-07-17 MED ORDER — HYDROCODONE-ACETAMINOPHEN 5-325 MG PO TABS: 1.0000 | ORAL_TABLET | ORAL | Status: DC | PRN

## 2012-07-17 MED ORDER — LACTATED RINGERS IV SOLN
INTRAVENOUS | Status: DC
  Administered 2012-07-17: 09:00:00 via INTRAVENOUS
  Filled 2012-07-17: qty 1000

## 2012-07-17 MED ORDER — SENNOSIDES-DOCUSATE SODIUM 8.6-50 MG PO TABS: 1.0000 | ORAL_TABLET | Freq: Two times a day (BID) | ORAL | Status: DC

## 2012-07-17 MED ORDER — CIPROFLOXACIN IN D5W 400 MG/200ML IV SOLN
400.0000 mg | Freq: Two times a day (BID) | INTRAVENOUS | Status: DC
Start: 2012-07-17 — End: 2012-07-17
  Administered 2012-07-17: 400 mg via INTRAVENOUS
  Filled 2012-07-17: qty 200

## 2012-07-17 MED ORDER — FENTANYL CITRATE 0.05 MG/ML IJ SOLN
INTRAMUSCULAR | Status: DC | PRN
  Administered 2012-07-17: 25 ug via INTRAVENOUS
  Administered 2012-07-17: 50 ug via INTRAVENOUS
  Administered 2012-07-17: 25 ug via INTRAVENOUS

## 2012-07-17 MED ORDER — BUPIVACAINE HCL (PF) 0.25 % IJ SOLN
INTRAMUSCULAR | Status: DC | PRN
  Administered 2012-07-17: 30 mL

## 2012-07-17 SURGICAL SUPPLY — 47 items
ADH SKN CLS APL DERMABOND .7 (GAUZE/BANDAGES/DRESSINGS) ×1
BANDAGE GAUZE ELAST BULKY 4 IN (GAUZE/BANDAGES/DRESSINGS) ×2 IMPLANT
BLADE HEX COATED 2.75 (ELECTRODE) ×2 IMPLANT
BLADE SURG 10 STRL SS (BLADE) IMPLANT
BLADE SURG 15 STRL LF DISP TIS (BLADE) ×1 IMPLANT
BLADE SURG 15 STRL SS (BLADE) ×2
CLOTH BEACON ORANGE TIMEOUT ST (SAFETY) ×2 IMPLANT
COVER MAYO STAND STRL (DRAPES) ×2 IMPLANT
COVER TABLE BACK 60X90 (DRAPES) ×2 IMPLANT
DERMABOND ADVANCED (GAUZE/BANDAGES/DRESSINGS) ×1
DERMABOND ADVANCED .7 DNX12 (GAUZE/BANDAGES/DRESSINGS) ×1 IMPLANT
DRAIN PENROSE 18X1/4 LTX STRL (WOUND CARE) IMPLANT
DRAPE PED LAPAROTOMY (DRAPES) ×2 IMPLANT
ELECT REM PT RETURN 9FT ADLT (ELECTROSURGICAL) ×2
ELECTRODE REM PT RTRN 9FT ADLT (ELECTROSURGICAL) ×1 IMPLANT
GAUZE SPONGE 4X4 12PLY STRL LF (GAUZE/BANDAGES/DRESSINGS) ×1 IMPLANT
GLOVE BIO SURGEON STRL SZ7.5 (GLOVE) ×1 IMPLANT
GLOVE BIOGEL PI IND STRL 7.0 (GLOVE) IMPLANT
GLOVE BIOGEL PI IND STRL 7.5 (GLOVE) ×1 IMPLANT
GLOVE BIOGEL PI INDICATOR 7.0 (GLOVE) ×1
GLOVE BIOGEL PI INDICATOR 7.5 (GLOVE) ×1
GLOVE ECLIPSE 7.0 STRL STRAW (GLOVE) ×1 IMPLANT
GLOVE ECLIPSE 7.5 STRL STRAW (GLOVE) ×2 IMPLANT
GOWN PREVENTION PLUS LG XLONG (DISPOSABLE) ×2 IMPLANT
GOWN PREVENTION PLUS XLARGE (GOWN DISPOSABLE) ×2 IMPLANT
NEEDLE HYPO 22GX1.5 SAFETY (NEEDLE) ×2 IMPLANT
NS IRRIG 500ML POUR BTL (IV SOLUTION) ×2 IMPLANT
PACK BASIN DAY SURGERY FS (CUSTOM PROCEDURE TRAY) ×2 IMPLANT
PENCIL BUTTON HOLSTER BLD 10FT (ELECTRODE) ×2 IMPLANT
SPONGE LAP 4X18 X RAY DECT (DISPOSABLE) IMPLANT
SUPPORT SCROTAL LG STRP (MISCELLANEOUS) ×2 IMPLANT
SUT CHROMIC 2 0 SH (SUTURE) IMPLANT
SUT CHROMIC 3 0 SH 27 (SUTURE) ×4 IMPLANT
SUT CHROMIC 4 0 SH 27 (SUTURE) ×2 IMPLANT
SUT MNCRL AB 4-0 PS2 18 (SUTURE) IMPLANT
SUT SILK 0 SH 30 (SUTURE) IMPLANT
SUT SILK 0 TIES 10X30 (SUTURE) IMPLANT
SUT VIC AB 2-0 SH 27 (SUTURE)
SUT VIC AB 2-0 SH 27XBRD (SUTURE) IMPLANT
SUT VIC AB 2-0 UR5 27 (SUTURE) IMPLANT
SUT VIC AB 3-0 SH 27 (SUTURE) ×2
SUT VIC AB 3-0 SH 27X BRD (SUTURE) IMPLANT
SUT VICRYL 0 TIES 12 18 (SUTURE) IMPLANT
SYR CONTROL 10ML LL (SYRINGE) IMPLANT
TOWEL OR 17X24 6PK STRL BLUE (TOWEL DISPOSABLE) ×4 IMPLANT
TRAY DSU PREP LF (CUSTOM PROCEDURE TRAY) ×2 IMPLANT
WATER STERILE IRR 500ML POUR (IV SOLUTION) IMPLANT

## 2012-07-17 NOTE — Transfer of Care (Signed)
Immediate Anesthesia Transfer of Care Note  Patient: [name]  Procedure(s) Performed: Procedure(s) (LRB):  EXCISION  OF LEFT SCROTAL SKIN LESION (Left)  Patient Location: PACU  Anesthesia Type: MAC  Level of Consciousness: awake, alert , oriented and patient cooperative  Airway & Oxygen Therapy: Patient Spontanous Breathing and Patient connected to face mask oxygen  Post-op Assessment: Report given to PACU RN and Post -op Vital signs reviewed and stable  Post vital signs: Reviewed and stable  Complications: No apparent anesthesia complications

## 2012-07-17 NOTE — H&P (Signed)
Urology History and Physical Exam  CC: Left scrotal neoplasm  HPI: 79 year old male presents today for excision of scrotal neoplasm. Ths has been present for 6-12 months. It is not associated with drainage. It is 1 cm in size. It is essentially unchanged from our office visit. It is located on the left lateral posterior scrotum. It is erythematous with heaped up edges. It is not tender to palpation. He was seen by her dermatologist, Dr. Londell Moh, who was concerned it appears possibly cancerous. It has not had any drainage.  We have discuss excision and her prefers to have sedation for the excision rather than do this in the clinic under local anesthetic. We have discussed the risks, benefits, alternatives, and likelihood of achieving goals.  PMH: Past Medical History  Diagnosis Date  . Rheumatoid arthritis     DR. HAWKES  . BPH (benign prostatic hypertrophy)   . Hypertension   . Scrotal lesion   . Short of breath on exertion   . RBBB   . History of urinary retention     PSH: Past Surgical History  Procedure Laterality Date  . Transurethral resection of prostate  SEVERAL YRS AGO  . Transthoracic echocardiogram  04-26-2011      MILD LVH/ LVSF NORMAL/ EF 60-65%/ GRADE I DIASTOLIC DYSFUNCTION/ MILD MITRAL REGURG.  . Cardiovascular stress test  04-26-2011  DR BRACKBILL    LOW RISK STRESS NUCLEAR STUDY/ NO EVIDENCE ISCHEMIA  . Lumbar fusion  X3  LAST ONE 1996    Allergies: Allergies  Allergen Reactions  . Penicillins Hives    Medications: No prescriptions prior to admission     Social History: History   Social History  . Marital Status: Married    Spouse Name: N/A    Number of Children: N/A  . Years of Education: N/A   Occupational History  . RETIRED    Social History Main Topics  . Smoking status: Former Smoker    Quit date: 07/11/1980  . Smokeless tobacco: Never Used  . Alcohol Use: No  . Drug Use: No  . Sexually Active: Not on file   Other Topics Concern  .  Not on file   Social History Narrative  . No narrative on file    Family History: Family History  Problem Relation Age of Onset  . Heart disease Father   . Colon cancer Father     Review of Systems: Positive: None. Negative: Fever, chest pain, or SOB.  A further 10 point review of systems was negative except what is listed in the HPI.  Physical Exam: Filed Vitals:   07/17/12 0806  BP: 140/82  Pulse: 55  Temp: 96.8 F (36 C)  Resp: 20  . General: No acute distress.  Awake. Head:  Normocephalic.  Atraumatic. ENT:  EOMI.  Mucous membranes moist Neck:  Supple.  No lymphadenopathy. CV:  S1 present. S2 present. Regular rate. Pulmonary: Equal effort bilaterally.  Clear to auscultation bilaterally. Abdomen: Soft.  Non- tender to palpation. Skin:  Normal turgor.  No visible rash. Extremity: No gross deformity of bilateral upper extremities.  No gross deformity of    bilateral lower extremities. Neurologic: Alert. Appropriate mood.  Genitourinary: Penis negative for lesions. Scrotum with 10-40mm erythematous lesion on posterior lateral left scrotum which is soft and mobile. Negative lymphadenopathy of the inguinal & femoral lymph nodes. .  Studies:  No results found for this basename: HGB, WBC, PLT,  in the last 72 hours  No results found for  this basename: NA, K, CL, CO2, BUN, CREATININE, CALCIUM, MAGNESIUM, GFRNONAA, GFRAA,  in the last 72 hours   No results found for this basename: PT, INR, APTT,  in the last 72 hours   No components found with this basename: ABG,     Assessment:  Left Scrotal neoplasm  Plan: Excision of scrotal neoplasm

## 2012-07-17 NOTE — Anesthesia Preprocedure Evaluation (Signed)
Anesthesia Evaluation  Patient identified by MRN, date of birth, ID band Patient awake    Reviewed: Allergy & Precautions, H&P , NPO status , Patient's Chart, lab work & pertinent test results  Airway Mallampati: II TM Distance: >3 FB Neck ROM: Full    Dental no notable dental hx.    Pulmonary shortness of breath,  breath sounds clear to auscultation  Pulmonary exam normal       Cardiovascular hypertension, Pt. on medications + dysrhythmias Rhythm:Regular Rate:Normal  Low risk stress test 2012.  RBBB   Neuro/Psych negative neurological ROS  negative psych ROS   GI/Hepatic negative GI ROS, Neg liver ROS,   Endo/Other  negative endocrine ROS  Renal/GU negative Renal ROS  negative genitourinary   Musculoskeletal  (+) Arthritis -,   Abdominal   Peds negative pediatric ROS (+)  Hematology negative hematology ROS (+)   Anesthesia Other Findings   Reproductive/Obstetrics negative OB ROS                           Anesthesia Physical Anesthesia Plan  ASA: III  Anesthesia Plan: MAC   Post-op Pain Management:    Induction: Intravenous  Airway Management Planned:   Additional Equipment:   Intra-op Plan:   Post-operative Plan:   Informed Consent: I have reviewed the patients History and Physical, chart, labs and discussed the procedure including the risks, benefits and alternatives for the proposed anesthesia with the patient or authorized representative who has indicated his/her understanding and acceptance.   Dental advisory given  Plan Discussed with: CRNA  Anesthesia Plan Comments:         Anesthesia Quick Evaluation

## 2012-07-17 NOTE — Op Note (Signed)
Urology Operative Report  Date of Procedure: 07/17/12  Surgeon: Natalia Leatherwood, MD Assistant: None  Preoperative Diagnosis: Left scrotal lesion Postoperative Diagnosis:  Same  Procedure(s): Excision of left scrotal lesion (1.1 cm)  Estimated blood loss: Minimal  Specimen: Left scrotal lesion with margin sent for pathology  Drains: None  Complications: None  Findings: Left scrotal lesion: 1.1 cm in size. Not fixed to deep tissue.  History of present illness: 79 year old male presents today with a left scrotal lesion. This was noted by his dermatologist who was concerned that it could possibly be cancerous. I am also concerned. Patient presents today for excision of this lesion.   Procedure in detail: After informed consent was obtained, the patient was taken to the operating room. They were placed in the supine position. SCDs were turned on and in place. IV antibiotics were infused, and IV sedation anesthesia was induced. A timeout was performed in which the correct patient, surgical site, and procedure were identified and agreed upon by the team.  The patient was placed in a supine position, making sure to pad all pertinent neurovascular pressure points. The hair was removed from his genitals which were prepped and draped in the usual sterile fashion.  I identified the lesion which was located on the posterior lateral portion of the scrotum. It does not feel to be adherent to deeper tissue. I measured this lesion with a ruler and measured 1.1 cm in size. I then marked margins around the lesion of 1 cm surrounding the lesion. I injected the skin around the lesion and along the planned incision sites with quarter percent Marcaine. I then made an elliptical incision with a 15 blade scalpel. A needle point Bovie electrocautery was then used to dissect out the lesion. Once underneath the lesion, it did not feel that it was adherent to any tissue. I did remove some dartos tissue with the  excision. After this, meticulous hemostasis was carried out with Bovie electrocautery. The specimen was sent to pathology. I irrigated the incision with copious amounts of sterile normal saline. I then closed the chart does in 2 layers. The first layer I used a running locking 3-0 Vicryl suture. The second layer of dartos I used an interrupted figure-of-eight 3-0 Vicryl suture.  The skin was closed with interrupted horizontal mattress sutures of 4-0 Monocryl. I then injected the remainder of the quarter percent Marcaine for total of 30 cc injected. Bacitracin ointment was placed over the incision and then sterile 4 x 4's and fluff gauze were placed. I again inspected his femoral and inguinal lymph nodes under sedation and these were negative to palpation for enlargement or firmness. The sedation was reversed and the patient was taken to the PACU in a stable condition.   I discussed the surgical findings and the course of surgery with the patient and his family.  All counts were correct.

## 2012-07-18 ENCOUNTER — Encounter (HOSPITAL_BASED_OUTPATIENT_CLINIC_OR_DEPARTMENT_OTHER): Payer: Self-pay | Admitting: Urology

## 2012-07-18 NOTE — Anesthesia Postprocedure Evaluation (Signed)
  Anesthesia Post-op Note  Patient: [name]  Procedure(s) Performed: Procedure(s) (LRB):  EXCISION  OF LEFT SCROTAL SKIN LESION (Left)  Patient Location: PACU  Anesthesia Type: MAC  Level of Consciousness: awake and alert   Airway and Oxygen Therapy: Patient Spontanous Breathing  Post-op Pain: mild  Post-op Assessment: Post-op Vital signs reviewed, Patient's Cardiovascular Status Stable, Respiratory Function Stable, Patent Airway and No signs of Nausea or vomiting  Last Vitals:  Filed Vitals:   07/17/12 1148  BP: 149/80  Pulse: 49  Temp: 36.1 C  Resp: 16    Post-op Vital Signs: stable   Complications: No apparent anesthesia complications

## 2012-09-10 ENCOUNTER — Other Ambulatory Visit: Payer: Self-pay | Admitting: Dermatology

## 2012-12-25 ENCOUNTER — Other Ambulatory Visit (HOSPITAL_COMMUNITY): Payer: Self-pay | Admitting: Urology

## 2012-12-25 DIAGNOSIS — D4959 Neoplasm of unspecified behavior of other genitourinary organ: Secondary | ICD-10-CM

## 2013-01-07 ENCOUNTER — Other Ambulatory Visit: Payer: Self-pay | Admitting: Dermatology

## 2013-01-23 ENCOUNTER — Ambulatory Visit (HOSPITAL_COMMUNITY)
Admission: RE | Admit: 2013-01-23 | Discharge: 2013-01-23 | Disposition: A | Discharge status: Home / Self Care | Payer: Medicare Other | Source: Ambulatory Visit | Attending: Urology | Admitting: Urology

## 2013-01-23 DIAGNOSIS — D4959 Neoplasm of unspecified behavior of other genitourinary organ: Secondary | ICD-10-CM

## 2013-01-23 DIAGNOSIS — N433 Hydrocele, unspecified: Secondary | ICD-10-CM | POA: Insufficient documentation

## 2013-01-23 DIAGNOSIS — N508 Other specified disorders of male genital organs: Secondary | ICD-10-CM | POA: Insufficient documentation

## 2013-01-23 DIAGNOSIS — I861 Scrotal varices: Secondary | ICD-10-CM | POA: Insufficient documentation

## 2013-04-04 ENCOUNTER — Encounter: Payer: Self-pay | Admitting: Internal Medicine

## 2013-04-04 DIAGNOSIS — J449 Chronic obstructive pulmonary disease, unspecified: Secondary | ICD-10-CM | POA: Insufficient documentation

## 2013-04-04 DIAGNOSIS — K219 Gastro-esophageal reflux disease without esophagitis: Secondary | ICD-10-CM | POA: Insufficient documentation

## 2013-04-04 DIAGNOSIS — J45909 Unspecified asthma, uncomplicated: Secondary | ICD-10-CM | POA: Insufficient documentation

## 2013-04-04 DIAGNOSIS — M1990 Unspecified osteoarthritis, unspecified site: Secondary | ICD-10-CM | POA: Insufficient documentation

## 2013-04-04 DIAGNOSIS — N400 Enlarged prostate without lower urinary tract symptoms: Secondary | ICD-10-CM | POA: Insufficient documentation

## 2013-04-04 DIAGNOSIS — K589 Irritable bowel syndrome without diarrhea: Secondary | ICD-10-CM | POA: Insufficient documentation

## 2013-04-07 ENCOUNTER — Encounter: Payer: Self-pay | Admitting: Internal Medicine

## 2013-04-07 NOTE — Progress Notes (Signed)
Patient ID: [name], male   DOB: 09-14-1930, 79 y.o.   MRN: [id number]  Annual Screening Comprehensive Examination  This very nice 79 yo MWM presents for complete physical.  Patient has been followed for HTN,  Prediabetes, Hyperlipidemia, COPD/asthma and vitamin D Deficiency.   Patient's BP has been controlled at home. Today's BP is 140/64.  He had neg. Stress Myoview in January 2013 (Dr Patty Sermons) and PFT's showed mild COPD consistant with his c/o chronic dyspnea. Patient denies any cardiac symptoms as chest pain, palpitations,  dizziness or ankle swelling. He does report some chronic DOE which has been felt in the past due to age, weight and deconditioning.   Patient's hyperlipidemia is controlled with diet. Cholesterol last visit was  144, elevated triglycerides 238 , HDL 37 and LDL 59 at goal. Patient alleges low fat/carb diet, but admittedly does little exercise.     Patient has prediabetes with last A1c 5.7 %  and elevated insulin 44  in October 2013 . Patient denies reactive hypoglycemic symptoms, visual blurring, diabetic polys, or paresthesias.    Finally, patient has history of Vitamin D Deficiency with last vitamin D 55 low in October 2013.     Medication Sig Dispense Refill  . finasteride (PROSCAR) 5 MG tablet Take 5 mg by mouth daily.       Marland Kitchen HYDROcodone-acetaminophen (NORCO/VICODIN) 5-325 MG per tablet Take 1-2 tablets by mouth every 4 (four) hours as needed for pain.  30 tablet  0  . losartan (COZAAR) 100 MG tablet Take 100 mg by mouth every morning.       . Multiple Vitamin (MULTIVITAMIN) tablet Take 1 tablet by mouth daily.       Marland Kitchen neomycin-bacitracin-polymyxin (NEOSPORIN) ointment Apply topically 3 (three) times daily. apply to scrotal incision.  15 g  3  . predniSONE (DELTASONE) 5 MG tablet Take 5 mg by mouth every morning. Mondays, wednesdays, fridays, and saturdays      . senna-docusate (SENOKOT S) 8.6-50 MG per tablet Take 1 tablet by mouth 2 (two) times daily.  60  tablet  0  . terazosin (HYTRIN) 10 MG capsule Take 10 mg by mouth every morning.       . Vitamin D, Ergocalciferol, (DRISDOL) 50000 UNITS CAPS Take 50,000 Units by mouth 4 (four) times a week.       No current facility-administered medications on file prior to visit.    Allergies  Allergen Reactions  . Ace Inhibitors   . Asa [Aspirin]     High Dose asprin  . Penicillins Hives  . Vasotec [Enalapril]     Cough    Past Medical History  Diagnosis Date  . Rheumatoid arthritis(714.0)     DR. HAWKES  . BPH (benign prostatic hypertrophy)   . Scrotal lesion   . Short of breath on exertion   . RBBB   . History of urinary retention   . Hypertension   . GERD (gastroesophageal reflux disease)   . DJD (degenerative joint disease)   . IBS (irritable bowel syndrome)   . Allergy   . COPD (chronic obstructive pulmonary disease)   . BPH (benign prostatic hypertrophy)   . Asthma     Past Surgical History  Procedure Laterality Date  . Transurethral resection of prostate  SEVERAL YRS AGO  . Transthoracic echocardiogram  04-26-2011      MILD LVH/ LVSF NORMAL/ EF 60-65%/ GRADE I DIASTOLIC DYSFUNCTION/ MILD MITRAL REGURG.  . Cardiovascular stress test  04-26-2011  DR [facility]  LOW RISK STRESS NUCLEAR STUDY/ NO EVIDENCE ISCHEMIA  . Lumbar fusion  X3  LAST ONE 1996  . Scrotal exploration Left 07/17/2012    Procedure:  EXCISION  OF LEFT SCROTAL SKIN LESION;  Surgeon: Milford Cage, MD;  Location: [facility];  Service: Urology;  Laterality: Left;    Family History  Problem Relation Age of Onset  . Heart disease Father   . Colon cancer Father   . Hypertension Father   . Diabetes Sister   . Heart disease Brother     History   Social History  . Marital Status: Married    Spouse Name: N/A    Number of Children: N/A  . Years of Education: N/A   Occupational History  . RETIRED    Social History Main Topics  . Smoking status: Former Smoker    Quit date:  07/11/1980  . Smokeless tobacco: Never Used  . Alcohol Use: No  . Drug Use: No  . Sexual Activity: Not on file   Other Topics Concern  . Not on file   Social History Narrative  . No narrative on file    ROS Constitutional: Denies fever, chills, weight loss/gain, headaches, insomnia, fatigue, night sweats, and change in appetite. Eyes: Denies redness, blurred vision, diplopia, discharge, itchy, watery eyes.  ENT: Denies discharge, congestion, post nasal drip, epistaxis, sore throat, earache, hearing loss, dental pain, Tinnitus, Vertigo, Sinus pain, snoring.  Cardio: Denies chest pain, palpitations, irregular heartbeat, syncope, dyspnea, diaphoresis, orthopnea, PND, claudication, edema Respiratory: denies cough, dyspnea, DOE, pleurisy, hoarseness, laryngitis, wheezing.  Gastrointestinal: Denies dysphagia, heartburn, reflux, water brash, pain, cramps, nausea, vomiting, bloating, diarrhea, constipation, hematemesis, melena, hematochezia, jaundice, hemorrhoids Genitourinary: Denies dysuria, frequency, urgency, nocturia, hesitancy, discharge, hematuria, flank pain Musculoskeletal: Denies arthralgia, myalgia, stiffness, Jt. Swelling, pain, limp, and strain/sprain. Skin: Denies puritis, rash, hives, warts, acne, eczema, changing in skin lesion Neuro: Weakness, tremor, incoordination, spasms, paresthesia, pain Psychiatric: Denies confusion, memory loss, sensory loss Endocrine: Denies change in weight, skin, hair change, nocturia, and paresthesia, diabetic polys, visual blurring, hyper /hypo glycemic episodes.  Heme/Lymph: No excessive bleeding, bruising, enlarged lymph nodes.  There were no vitals filed for this visit.  Estimated body mass index is 32.55 kg/(m^2) as calculated from the following:   Height as of 07/11/12: 6\' 4"  (1.93 m).   Weight as of 07/17/12: 267 lb 5 oz (121.252 kg).  Physical Exam General Appearance: Well nourished, in no apparent distress. Eyes: PERRLA, EOMs, conjunctiva  no swelling or erythema, normal fundi and vessels. Sinuses: No frontal/maxillary tenderness ENT/Mouth: EACs patent / TMs  nl. Nares clear without erythema, swelling, mucoid exudates. Oral hygiene is good. No erythema, swelling, or exudate. Tongue normal, non-obstructing. Tonsils not swollen or erythematous. Hearing normal.  Neck: Supple, thyroid normal. No bruits, nodes or JVD. Respiratory: Respiratory effort normal.  BS equal and clear bilateral without rales, rhonci, wheezing or stridor. Cardio: Heart sounds are normal with regular rate and rhythm and no murmurs, rubs or gallops. Peripheral pulses are normal and equal bilaterally without edema. No aortic or femoral bruits. Chest: symmetric with normal excursions and percussion.  Abdomen: Flat, soft, with bowl sounds. Nontender, no guarding, rebound, hernias, masses, or organomegaly.  Lymphatics: Non tender without lymphadenopathy.  Genitourinary: No hernias.Testes nl. DRE - deferred done recently by Dr Margarita Grizzle. Musculoskeletal: Full ROM all peripheral extremities, joint stability, 5/5 strength, and normal gait. Skin: Warm and dry without rashes, lesions, cyanosis, clubbing or  ecchymosis.  Neuro: Cranial nerves intact, reflexes equal  bilaterally. Normal muscle tone, no cerebellar symptoms. Sensation intact.  Pysch: Awake and oriented X 3, normal affect, Insight and Judgment appropriate.   Assessment and Plan  1. Annual Screening Examination 2. Hypertension  3. Hyperlipidemia 4. Pre Diabetes 5. Vitamin D Deficiency  Continue prudent diet as discussed, weight control, regular exercise, and medications. Routine screening labs and tests as requested with regular follow-up as recommended.

## 2013-04-08 ENCOUNTER — Ambulatory Visit: Payer: Medicare Other | Admitting: Internal Medicine

## 2013-04-08 ENCOUNTER — Encounter: Payer: Self-pay | Admitting: Internal Medicine

## 2013-04-08 VITALS — BP 140/64 | HR 72 | Temp 98.2°F | Resp 18 | Ht 76.5 in | Wt 273.2 lb

## 2013-04-08 DIAGNOSIS — I10 Essential (primary) hypertension: Secondary | ICD-10-CM

## 2013-04-08 DIAGNOSIS — Z1212 Encounter for screening for malignant neoplasm of rectum: Secondary | ICD-10-CM

## 2013-04-08 DIAGNOSIS — R7309 Other abnormal glucose: Secondary | ICD-10-CM | POA: Insufficient documentation

## 2013-04-08 DIAGNOSIS — E782 Mixed hyperlipidemia: Secondary | ICD-10-CM | POA: Insufficient documentation

## 2013-04-08 DIAGNOSIS — E559 Vitamin D deficiency, unspecified: Secondary | ICD-10-CM

## 2013-04-08 DIAGNOSIS — Z79899 Other long term (current) drug therapy: Secondary | ICD-10-CM

## 2013-04-08 DIAGNOSIS — Z125 Encounter for screening for malignant neoplasm of prostate: Secondary | ICD-10-CM

## 2013-04-08 DIAGNOSIS — Z0000 Encounter for general adult medical examination without abnormal findings: Secondary | ICD-10-CM

## 2013-04-08 LAB — CBC WITH DIFFERENTIAL/PLATELET
Basophils Absolute: 0 10*3/uL (ref 0.0–0.1)
Eosinophils Absolute: 0.2 10*3/uL (ref 0.0–0.7)
Lymphocytes Relative: 26 % (ref 12–46)
Lymphs Abs: 2.1 10*3/uL (ref 0.7–4.0)
MCH: 32.2 pg (ref 26.0–34.0)
Neutrophils Relative %: 62 % (ref 43–77)
Platelets: 244 10*3/uL (ref 150–400)
RBC: 4.38 MIL/uL (ref 4.22–5.81)
RDW: 13.8 % (ref 11.5–15.5)
WBC: 8.2 10*3/uL (ref 4.0–10.5)

## 2013-04-08 LAB — HEMOGLOBIN A1C: Hgb A1c MFr Bld: 5.6 % (ref <5.7)

## 2013-04-08 LAB — LIPID PANEL
Cholesterol: 170 mg/dL (ref 0–200)
LDL Cholesterol: 75 mg/dL (ref 0–99)
Total CHOL/HDL Ratio: 5 Ratio
VLDL: 61 mg/dL — ABNORMAL HIGH (ref 0–40)

## 2013-04-08 LAB — BASIC METABOLIC PANEL WITH GFR
CO2: 28 mEq/L (ref 19–32)
Calcium: 9.1 mg/dL (ref 8.4–10.5)
GFR, Est Non African American: 52 mL/min — ABNORMAL LOW
Sodium: 139 mEq/L (ref 135–145)

## 2013-04-08 LAB — HEPATIC FUNCTION PANEL
ALT: 26 U/L (ref 0–53)
AST: 22 U/L (ref 0–37)
Alkaline Phosphatase: 83 U/L (ref 39–117)
Bilirubin, Direct: 0.1 mg/dL (ref 0.0–0.3)
Indirect Bilirubin: 0.7 mg/dL (ref 0.0–0.9)
Total Bilirubin: 0.8 mg/dL (ref 0.3–1.2)
Total Protein: 6.9 g/dL (ref 6.0–8.3)

## 2013-04-08 LAB — PSA: PSA: 0.39 ng/mL (ref <=4.00)

## 2013-04-08 LAB — TSH: TSH: 1.471 u[IU]/mL (ref 0.350–4.500)

## 2013-04-08 NOTE — Patient Instructions (Signed)

## 2013-04-09 LAB — URINALYSIS, MICROSCOPIC ONLY: Squamous Epithelial / LPF: NONE SEEN

## 2013-04-09 LAB — MICROALBUMIN / CREATININE URINE RATIO
Creatinine, Urine: 279.8 mg/dL
Microalb, Ur: 12.11 mg/dL — ABNORMAL HIGH (ref 0.00–1.89)

## 2013-04-23 ENCOUNTER — Other Ambulatory Visit: Payer: Medicare Other | Admitting: Internal Medicine

## 2013-04-23 DIAGNOSIS — Z1212 Encounter for screening for malignant neoplasm of rectum: Secondary | ICD-10-CM

## 2013-04-23 LAB — POC HEMOCCULT BLD/STL (HOME/3-CARD/SCREEN)
Card #3 Fecal Occult Blood, POC: NEGATIVE
Fecal Occult Blood, POC: NEGATIVE

## 2013-05-13 ENCOUNTER — Telehealth: Payer: Self-pay | Admitting: *Deleted

## 2013-05-13 MED ORDER — AZITHROMYCIN 250 MG PO TABS: ORAL_TABLET | ORAL | Status: AC

## 2013-05-13 NOTE — Telephone Encounter (Signed)
Patient called. Has chest congestion with wheezing and tightness in chest but no fever. Per DR Oneta Rack, call in rx for Z-pak and advised use Dayquil and Nyquil.

## 2013-05-19 ENCOUNTER — Encounter: Payer: Self-pay | Admitting: Physician Assistant

## 2013-05-19 ENCOUNTER — Ambulatory Visit (INDEPENDENT_AMBULATORY_CARE_PROVIDER_SITE_OTHER): Payer: Medicare Other | Admitting: Physician Assistant

## 2013-05-19 ENCOUNTER — Ambulatory Visit (HOSPITAL_COMMUNITY)
Admission: RE | Admit: 2013-05-19 | Discharge: 2013-05-19 | Disposition: A | Discharge status: Home / Self Care | Payer: Medicare Other | Source: Ambulatory Visit | Attending: Physician Assistant | Admitting: Physician Assistant

## 2013-05-19 VITALS — BP 138/68 | HR 88 | Temp 98.1°F | Resp 16 | Ht 76.5 in | Wt 274.0 lb

## 2013-05-19 DIAGNOSIS — R059 Cough, unspecified: Secondary | ICD-10-CM | POA: Insufficient documentation

## 2013-05-19 DIAGNOSIS — J209 Acute bronchitis, unspecified: Secondary | ICD-10-CM | POA: Insufficient documentation

## 2013-05-19 DIAGNOSIS — R062 Wheezing: Secondary | ICD-10-CM | POA: Insufficient documentation

## 2013-05-19 DIAGNOSIS — I10 Essential (primary) hypertension: Secondary | ICD-10-CM | POA: Insufficient documentation

## 2013-05-19 DIAGNOSIS — R0602 Shortness of breath: Secondary | ICD-10-CM | POA: Insufficient documentation

## 2013-05-19 DIAGNOSIS — J440 Chronic obstructive pulmonary disease with acute lower respiratory infection: Secondary | ICD-10-CM | POA: Insufficient documentation

## 2013-05-19 DIAGNOSIS — R05 Cough: Secondary | ICD-10-CM | POA: Insufficient documentation

## 2013-05-19 MED ORDER — BENZONATATE 100 MG PO CAPS: 100.0000 mg | ORAL_CAPSULE | Freq: Four times a day (QID) | ORAL | Status: DC | PRN

## 2013-05-19 MED ORDER — LEVOFLOXACIN 500 MG PO TABS: 500.0000 mg | ORAL_TABLET | Freq: Every day | ORAL | Status: AC

## 2013-05-19 MED ORDER — PREDNISONE 20 MG PO TABS: ORAL_TABLET | ORAL | Status: DC

## 2013-05-19 NOTE — Progress Notes (Signed)
Subjective:    Patient ID: [name], male    DOB: [birth year], 80 y.o.   MRN: [id number]  Cough This is a new problem. Episode onset: 2 weeks. The cough is productive of purulent sputum. Associated symptoms include shortness of breath and wheezing. Pertinent negatives include no chest pain, chills, ear congestion, ear pain, fever, headaches, heartburn, hemoptysis, myalgias, nasal congestion, postnasal drip, rash, rhinorrhea, sore throat, sweats or weight loss. Nothing aggravates the symptoms. Treatments tried: zpak. The treatment provided no relief. His past medical history is significant for COPD.   Current Outpatient Prescriptions on File Prior to Visit  Medication Sig Dispense Refill  . finasteride (PROSCAR) 5 MG tablet Take 5 mg by mouth daily.       Marland Kitchen HYDROcodone-acetaminophen (NORCO/VICODIN) 5-325 MG per tablet Take 1-2 tablets by mouth every 4 (four) hours as needed for pain.  30 tablet  0  . losartan (COZAAR) 100 MG tablet Take 100 mg by mouth every morning.       . Multiple Vitamin (MULTIVITAMIN) tablet Take 1 tablet by mouth daily.       . predniSONE (DELTASONE) 5 MG tablet Take 5 mg by mouth every morning. Mondays, wednesdays, fridays, and saturdays      . terazosin (HYTRIN) 10 MG capsule Take 10 mg by mouth every morning.       . Vitamin D, Ergocalciferol, (DRISDOL) 50000 UNITS CAPS Take 50,000 Units by mouth 4 (four) times a week.       No current facility-administered medications on file prior to visit.   Past Medical History  Diagnosis Date  . Rheumatoid arthritis(714.0)     DR. HAWKES  . BPH (benign prostatic hypertrophy)   . Scrotal lesion   . Short of breath on exertion   . RBBB   . History of urinary retention   . Hypertension   . GERD (gastroesophageal reflux disease)   . DJD (degenerative joint disease)   . IBS (irritable bowel syndrome)   . Allergy   . COPD (chronic obstructive pulmonary disease)   . BPH (benign prostatic hypertrophy)   . Asthma       Review of Systems  Constitutional: Positive for fatigue. Negative for fever, chills and weight loss.  HENT: Negative for congestion, ear pain, postnasal drip, rhinorrhea and sore throat.   Eyes: Negative.   Respiratory: Positive for cough, shortness of breath and wheezing. Negative for hemoptysis and chest tightness.   Cardiovascular: Negative.  Negative for chest pain.  Gastrointestinal: Negative.  Negative for heartburn.  Genitourinary: Negative.   Musculoskeletal: Negative.  Negative for myalgias.  Skin: Negative for rash.  Neurological: Negative.  Negative for headaches.       Objective:   Physical Exam  Constitutional: He is oriented to person, place, and time. He appears well-developed and well-nourished.  HENT:  Head: Normocephalic and atraumatic.  Right Ear: External ear normal.  Left Ear: External ear normal.  Nose: Nose normal.  Mouth/Throat: Oropharynx is clear and moist.  Eyes: Conjunctivae are normal. Pupils are equal, round, and reactive to light.  Neck: Normal range of motion. Neck supple.  Cardiovascular: Normal rate, regular rhythm and normal heart sounds.   No murmur heard. Pulmonary/Chest: Effort normal. No respiratory distress. He has wheezes. He has no rales. He exhibits no tenderness.  Abdominal: Soft. Bowel sounds are normal.  Lymphadenopathy:    He has no cervical adenopathy.  Neurological: He is alert and oriented to person, place, and time.  Skin: Skin is warm  and dry.       Assessment & Plan:  Acute bronchitis - Plan: levofloxacin (LEVAQUIN) 500 MG tablet, predniSONE (DELTASONE) 20 MG tablet, benzonatate (TESSALON PERLES) 100 MG capsule, DG Chest 2 View  He has not had a chest xray since 2012, will send for CXR.

## 2013-05-19 NOTE — Patient Instructions (Signed)
Oxygen saturation is 98% RA, normal vitals, lungs sounds are diffuse wheezing no focal breath sounds. No need for CXR at this time, still treating the same.  Please take the antibiotic that is good for pneumonia and bronchitis and the prednisone given.   The majority of colds are caused by viruses and do not require antibiotics. Please read the rest of this hand out to learn more about the common cold and what you can do to help yourself as well as help prevent the over use of antibiotics.   COMMON COLD SIGNS AND SYMPTOMS - The common cold usually causes nasal congestion, runny nose, and sneezing. A sore throat may be present on the first day but usually resolves quickly. If a cough occurs, it generally develops on about the fourth or fifth day of symptoms, typically when congestion and runny nose are resolving  COMMON COLD COMPLICATIONS - In most cases, colds do not cause serious illness or complications. Most colds last for three to seven days, although many people continue to have symptoms (coughing, sneezing, congestion) for up to two weeks.  One of the more common complications is sinusitis, which is usually caused by viruses and rarely (about 2 percent of the time) by bacteria. Having thick or yellow to green-colored nasal discharge does not mean that bacterial sinusitis has developed; discolored nasal discharge is a normal phase of the common cold.  Lower respiratory infections, such as pneumonia or bronchitis, may develop following a cold.  Infection of the middle ear, or otitis media, can accompany or follow a cold.  COMMON COLD TREATMENT - There is no specific treatment for the viruses that cause the common cold. Most treatments are aimed at relieving some of the symptoms of the cold, but do not shorten or cure the cold. Antibiotics are not useful for treating the common cold; antibiotics are only used to treat illnesses caused by bacteria, not viruses. Unnecessary use of antibiotics for the  treatment of the common cold can cause allergic reactions, diarrhea, or other gastrointestinal symptoms in some patients.  The symptoms of a cold will resolve over time, even without any treatment. People with underlying medical conditions and those who use other over-the-counter or prescription medications should speak with their healthcare provider or pharmacist to ensure that it is safe to use these treatments. The following are treatments that may reduce the symptoms caused by the common cold.  Nasal congestion - Decongestants are good for nasal congestion- if you feel very stuffy but no mucus is coming out, this is the medication that will help you the most.  Pseudoephedrine is a decongestant that can improve nasal congestion. Although a prescription is not required, drugstores in the [place] keep pseudoephedrine behind the counter, so it must be requested from a pharmacist. If you have a heart condition or high blood pressure please use Coricidin BPH instead.   Runny nose - Antihistamines such as diphenhydramine (Benadryl), certazine (Zyrtec) which are best taking at night because they can make you tired OR loratadine (Claritin),  fexafinadine (Allegra) help with a runny nose.   Nasal sprays such an oxymetazoline (Afrin and others) may also give temporary relief of nasal congestion. However, these sprays should never be used for more than two to three days; use for more than three days use can worsen congestion.  Nasocort is now over the counter and can help decrease a runny nose. Please stop the medication if you have blurry vision or nose bleeds.   Sore throat and  headache - Sore throat and headache are best treated with a mild pain reliever such as acetaminophen (Tylenol) or a non-steroidal anti-inflammatory agent such as ibuprofen or naproxen (Motrin or Aleve). These medications should be taken with food to prevent stomach problems. As well as gargling with warm water and salt.   Cough -  Common cough medicine ingredients include guaifenesin and dextromethorphan; these are often combined with other medications in over-the-counter cold formulas. Often a cough is worse at night or first in the morning due to post nasal drip from you nose. You can try to sleep at an angle to decrease a cough.   Alternative treatments - Heated, humidified air can improve symptoms of nasal congestion and runny nose, and causes few to no side effects. A number of alternative products, including vitamin C, doubling up on your vitamin D and herbal products such as echinacea, may help. Certain products, such as nasal gels that contain zinc (eg, Zicam), have been associated with a permanent loss of smell.  Antibiotics - Antibiotics should not be used to treat an uncomplicated common cold. As noted above, colds are caused by viruses. Antibiotics treat bacterial, not viral infections. Some viruses that cause the common cold can also depress the immune system or cause swelling in the lining of the nose or airways; this can, in turn, lead to a bacterial infection. Often you need to give your body 7 days to fight off a common cold while treating the symptoms with the medications listed above. If after 7 days your symptoms are not improving, you are getting worse, you have shortness of breath, chest pain, a fever of over 103 you should seek medical help immediately.   PREVENTION IS THE BEST MEDICINE - Hand washing is an essential and highly effective way to prevent the spread of infection.  Alcohol-based hand rubs are a good alternative for disinfecting hands if a sink is not available.  Hands should be washed before preparing food and eating and after coughing, blowing the nose, or sneezing. While it is not always possible to limit contact with people who may be infected with a cold, touching the eyes, nose, or mouth after direct contact should be avoided when possible. Sneezing/coughing into the sleeve of one's clothing  (at the inner elbow) is another means of containing sprays of saliva and secretions and does not contaminate the hands.

## 2013-07-05 ENCOUNTER — Other Ambulatory Visit: Payer: Self-pay | Admitting: Internal Medicine

## 2013-07-08 ENCOUNTER — Other Ambulatory Visit: Payer: Self-pay | Admitting: Dermatology

## 2013-08-05 ENCOUNTER — Ambulatory Visit (INDEPENDENT_AMBULATORY_CARE_PROVIDER_SITE_OTHER): Payer: Medicare Other | Admitting: Internal Medicine

## 2013-08-05 ENCOUNTER — Encounter: Payer: Self-pay | Admitting: Internal Medicine

## 2013-08-05 VITALS — BP 146/72 | HR 76 | Temp 98.1°F | Resp 18 | Ht 76.0 in | Wt 276.2 lb

## 2013-08-05 DIAGNOSIS — L500 Allergic urticaria: Secondary | ICD-10-CM

## 2013-08-05 DIAGNOSIS — T50905A Adverse effect of unspecified drugs, medicaments and biological substances, initial encounter: Secondary | ICD-10-CM

## 2013-08-05 MED ORDER — PREDNISONE 20 MG PO TABS: 20.0000 mg | ORAL_TABLET | ORAL | Status: DC

## 2013-08-05 NOTE — Patient Instructions (Signed)
  Allergic to Cleocin  (Clindamycin)   Hives Hives are itchy, red, swollen areas of the skin. They can vary in size and location on your body. Hives can come and go for hours or several days (acute hives) or for several weeks (chronic hives). Hives do not spread from person to person (noncontagious). They may get worse with scratching, exercise, and emotional stress. CAUSES   Allergic reaction to food, additives, or drugs.  Infections, including the common cold.  Illness, such as vasculitis, lupus, or thyroid disease.  Exposure to sunlight, heat, or cold.  Exercise.  Stress.  Contact with chemicals. SYMPTOMS   Red or white swollen patches on the skin. The patches may change size, shape, and location quickly and repeatedly.  Itching.  Swelling of the hands, feet, and face. This may occur if hives develop deeper in the skin. DIAGNOSIS  Your caregiver can usually tell what is wrong by performing a physical exam. Skin or blood tests may also be done to determine the cause of your hives. In some cases, the cause cannot be determined. TREATMENT  Mild cases usually get better with medicines such as antihistamines. Severe cases may require an emergency epinephrine injection. If the cause of your hives is known, treatment includes avoiding that trigger.  HOME CARE INSTRUCTIONS   Avoid causes that trigger your hives.  Take antihistamines as directed by your caregiver to reduce the severity of your hives. Non-sedating or low-sedating antihistamines are usually recommended. Do not drive while taking an antihistamine.  Take any other medicines prescribed for itching as directed by your caregiver.  Wear loose-fitting clothing.  Keep all follow-up appointments as directed by your caregiver. SEEK MEDICAL CARE IF:   You have persistent or severe itching that is not relieved with medicine.  You have painful or swollen joints. SEEK IMMEDIATE MEDICAL CARE IF:   You have a fever.  Your  tongue or lips are swollen.  You have trouble breathing or swallowing.  You feel tightness in the throat or chest.  You have abdominal pain. These problems may be the first sign of a life-threatening allergic reaction. Call your local emergency services (911 in [place] MAKE SURE YOU:   Understand these instructions.  Will watch your condition.  Will get help right away if you are not doing well or get worse. Document Released: 05/01/2005 Document Revised: 10/31/2011 Document Reviewed: 07/25/2011 [facility] Patient Information 2014 [place].

## 2013-08-05 NOTE — Progress Notes (Signed)
   Subjective:    Patient ID: [name], male    DOB: January 31, 1931, 80 y.o.   MRN: [id number]  HPI Patient was recently prescribed Cleocin after oral surgery and after completion developed a migratory hive like rash over the trunk and extremities. Current Outpatient Prescriptions on File Prior to Visit  Medication Sig Dispense Refill  . finasteride (PROSCAR) 5 MG tablet Take 5 mg by mouth daily.       Marland Kitchen losartan (COZAAR) 100 MG tablet Take 100 mg by mouth every morning.       . Multiple Vitamin (MULTIVITAMIN) tablet Take 1 tablet by mouth daily.       . predniSONE (DELTASONE) 5 MG tablet Take 5 mg by mouth every morning. Mondays, wednesdays, fridays, and saturdays      . terazosin (HYTRIN) 10 MG capsule Take 10 mg by mouth every morning.       . Vitamin D, Ergocalciferol, (DRISDOL) 50000 UNITS CAPS capsule TAKE 1 CAPSULE BY MOUTH DAILY OR AS DIRECTED  30 capsule  3   No current facility-administered medications on file prior to visit.   Allergies  Allergen Reactions  . Ace Inhibitors   . Asa [Aspirin]     High Dose asprin  . Penicillins Hives  . Vasotec [Enalapril]     Cough  . Clindamycin/Lincomycin     Hives    Past Medical History  Diagnosis Date  . Rheumatoid arthritis(714.0)     DR. HAWKES  . BPH (benign prostatic hypertrophy)   . Scrotal lesion   . Short of breath on exertion   . RBBB   . History of urinary retention   . Hypertension   . GERD (gastroesophageal reflux disease)   . DJD (degenerative joint disease)   . IBS (irritable bowel syndrome)   . Allergy   . COPD (chronic obstructive pulmonary disease)   . BPH (benign prostatic hypertrophy)   . Asthma        Review of Systems Negative except as above      Objective:   Physical Exam   BP 146/72  Pulse 76  Temp(Src) 98.1 F (36.7 C) (Temporal)  Resp 18  Ht 6\' 4"  (1.93 m)  Wt 276 lb 3.2 oz (125.283 kg)  BMI 33.63 kg/m2  Exam focused on skin shows a generalized urticarial type rash over the  trunk and extremities  Assessment & Plan:   1. Allergic urticaria  2. Adverse drug reaction (Clindamycin)  Rx Prednisone 20 mg # 20 pulse taper over 10 days (hold maintenance 5 mg)

## 2013-10-10 ENCOUNTER — Encounter: Payer: Self-pay | Admitting: Internal Medicine

## 2013-10-10 NOTE — Progress Notes (Signed)
Patient ID: [name], male   DOB: [birth year], 80 y.o.   MRN: [id number]          E R R O R

## 2013-11-11 ENCOUNTER — Other Ambulatory Visit: Payer: Self-pay | Admitting: Dermatology

## 2013-12-18 ENCOUNTER — Other Ambulatory Visit: Payer: Self-pay | Admitting: *Deleted

## 2013-12-18 MED ORDER — LOSARTAN POTASSIUM 100 MG PO TABS
100.0000 mg | ORAL_TABLET | Freq: Every morning | ORAL | Status: DC
Start: 2013-12-18 — End: 2014-05-12

## 2013-12-23 ENCOUNTER — Other Ambulatory Visit: Payer: Self-pay | Admitting: Internal Medicine

## 2013-12-23 MED ORDER — TERAZOSIN HCL 10 MG PO CAPS: 10.0000 mg | ORAL_CAPSULE | Freq: Every morning | ORAL | Status: DC

## 2014-02-11 ENCOUNTER — Other Ambulatory Visit: Payer: Self-pay | Admitting: Dermatology

## 2014-03-02 ENCOUNTER — Other Ambulatory Visit: Payer: Self-pay | Admitting: Emergency Medicine

## 2014-04-15 ENCOUNTER — Encounter: Payer: Self-pay | Admitting: Internal Medicine

## 2014-04-15 ENCOUNTER — Ambulatory Visit (INDEPENDENT_AMBULATORY_CARE_PROVIDER_SITE_OTHER): Payer: Medicare Other | Admitting: Internal Medicine

## 2014-04-15 VITALS — BP 136/74 | HR 80 | Temp 97.9°F | Resp 18 | Ht 76.5 in | Wt 276.0 lb

## 2014-04-15 DIAGNOSIS — E559 Vitamin D deficiency, unspecified: Secondary | ICD-10-CM | POA: Insufficient documentation

## 2014-04-15 DIAGNOSIS — R7303 Prediabetes: Secondary | ICD-10-CM

## 2014-04-15 DIAGNOSIS — Z125 Encounter for screening for malignant neoplasm of prostate: Secondary | ICD-10-CM

## 2014-04-15 DIAGNOSIS — I10 Essential (primary) hypertension: Secondary | ICD-10-CM

## 2014-04-15 DIAGNOSIS — Z79899 Other long term (current) drug therapy: Secondary | ICD-10-CM

## 2014-04-15 DIAGNOSIS — R6889 Other general symptoms and signs: Secondary | ICD-10-CM

## 2014-04-15 DIAGNOSIS — Z23 Encounter for immunization: Secondary | ICD-10-CM

## 2014-04-15 DIAGNOSIS — J42 Unspecified chronic bronchitis: Secondary | ICD-10-CM

## 2014-04-15 DIAGNOSIS — Z1212 Encounter for screening for malignant neoplasm of rectum: Secondary | ICD-10-CM

## 2014-04-15 DIAGNOSIS — E782 Mixed hyperlipidemia: Secondary | ICD-10-CM

## 2014-04-15 DIAGNOSIS — E538 Deficiency of other specified B group vitamins: Secondary | ICD-10-CM

## 2014-04-15 DIAGNOSIS — Z0001 Encounter for general adult medical examination with abnormal findings: Secondary | ICD-10-CM

## 2014-04-15 LAB — CBC WITH DIFFERENTIAL/PLATELET
BASOS PCT: 0 % (ref 0–1)
Basophils Absolute: 0 10*3/uL (ref 0.0–0.1)
Eosinophils Absolute: 0.2 10*3/uL (ref 0.0–0.7)
Eosinophils Relative: 2 % (ref 0–5)
HEMATOCRIT: 40.7 % (ref 39.0–52.0)
Hemoglobin: 13.8 g/dL (ref 13.0–17.0)
LYMPHS ABS: 2.4 10*3/uL (ref 0.7–4.0)
Lymphocytes Relative: 26 % (ref 12–46)
MCH: 30.8 pg (ref 26.0–34.0)
MCHC: 33.9 g/dL (ref 30.0–36.0)
MCV: 90.8 fL (ref 78.0–100.0)
MONOS PCT: 8 % (ref 3–12)
MPV: 9.6 fL (ref 9.4–12.4)
Monocytes Absolute: 0.7 10*3/uL (ref 0.1–1.0)
NEUTROS ABS: 5.9 10*3/uL (ref 1.7–7.7)
Neutrophils Relative %: 64 % (ref 43–77)
Platelets: 243 10*3/uL (ref 150–400)
RBC: 4.48 MIL/uL (ref 4.22–5.81)
RDW: 13.6 % (ref 11.5–15.5)
WBC: 9.2 10*3/uL (ref 4.0–10.5)

## 2014-04-15 NOTE — Progress Notes (Signed)
Patient ID: [name], male   DOB: Mar 10, 1931, 80 y.o.   MRN: [id number]  [facility] VISIT AND CPE  Assessment:   1. Encounter for general adult medical examination with abnormal findings   2. Essential hypertension  - Microalbumin / creatinine urine ratio - EKG 12-Lead - [place], RETROPERITNL ABD,  LTD - TSH  3. Mixed hyperlipidemia  - Lipid panel  4. Prediabetes   5. Vitamin D deficiency  - Hemoglobin A1c - Insulin, fasting  6. Chronic bronchitis, unspecified chronic bronchitis type  - Vit D  25 hydroxy (rtn osteoporosis monitoring)  7. Prostate cancer screening  - PSA  8. Screening for rectal cancer  - POC Hemoccult Bld/Stl (3-Cd Home Screen); Future  9. Medication management  - Urine Microscopic - CBC with Differential - BASIC METABOLIC PANEL WITH GFR - Hepatic function panel - Magnesium  10. B12 deficiency  - Vitamin B12  11. Need for prophylactic vaccination against Streptococcus pneumoniae (pneumococcus)  - Pneumococcal conjugate vaccine 13-valent  12. Need for prophylactic vaccination and inoculation against influenza  - Flu vaccine HIGH DOSE PF (Fluzone Tri High dose)  13. Need for prophylactic vaccination with tetanus-diphtheria (TD)  - DT Vaccine   Plan:   During the course of the visit the patient was educated and counseled about appropriate screening and preventive services including:    Pneumococcal vaccine   Influenza vaccine  Td vaccine  Screening electrocardiogram  Bone densitometry screening  Colorectal cancer screening  Diabetes screening  Glaucoma screening  Nutrition counseling   Advanced directives: requested  Screening recommendations, referrals: Vaccinations: DT vaccine 2002 & 04/15/2014 Influenza vaccine HH 04/15/2014 Pneumococcal vaccine 03/30/2011 Prevnar vaccine 04/15/2014 Shingles vaccine 04/01/2012 Hep B vaccine not indicated  Nutrition assessed and recommended  Colonoscopy  09/2009 Recommended yearly ophthalmology/optometry visit for glaucoma screening and checkup Recommended yearly dental visit for hygiene and checkup Advanced directives - no - offered forms  Conditions/risks identified: BMI: Discussed weight loss, diet, and increase physical activity.  Increase physical activity: AHA recommends 150 minutes of physical activity a week.  Medications reviewed PreDiabetes is at goal, ACE/ARB therapy: Not Indicated RA(May 2010) - and controlled with low dose alternate day prednisone & followed by Dr Trudie Reed  Urinary Incontinence is not an issue: discussed non pharmacology and pharmacology options.  Fall risk: low- discussed PT, home fall assessment, medications.   Subjective:  [name] is a 80 y.o. MWM who presents for Medicare Annual Wellness Visit and complete physical.  Date of last medicare wellness visit is unknown.  He has had elevated blood pressure since 1999. His blood pressure has been controlled at home, today their BP is BP: 136/74 mmHg. Neg Stress Myoview Jan 2013.  He does have COPD and admits DOE with exertion as walking briskly or up stairs. Denies chronic cough or sputum. PFT's 07/2011 showed mild COPD.    He does not workout. He denies chest pain, shortness of breath, dizziness.  He is not on cholesterol medication and denies myalgias. His cholesterol is at goal.  His Trig are elevated. The cholesterol last visit was:  Lab Results  Component Value Date   CHOL 170 04/08/2013   HDL 34* 04/08/2013   LDLCALC 75 04/08/2013   TRIG 303* 04/08/2013   CHOLHDL 5.0 04/08/2013   He has had pre diabetes for 2 years since Oct 2013 with A1c 5.7% and elevated insulin 42.   He has not been working on diet and exercise for prediabetes, and denies foot ulcerations, hyperglycemia,  paresthesia of the feet, polydipsia, polyuria and visual disturbances. Last A1C in the office was:  Lab Results  Component Value Date   HGBA1C 5.6 04/08/2013   Patient is on  Vitamin D supplement.   Lab Results  Component Value Date   VD25OH 79 04/08/2013     Names of Other Physician/Practitioners you currently use: 1. [facility] here for primary care 2. Dr Katy Fitch, eye doctor, last visit 2014 3. Dr Olena Heckle, dentist, last visit Sept 2014 4. Dr Page Spiro, Rheumatology 5. Dr Jasmine December, Urology 6. Dr Pablo Ledger, Dermatology  Patient Care Team: Unk Pinto, MD as PCP - General (Internal Medicine)  Medication Review: Medication Sig  . finasteride (PROSCAR) 5 MG tablet Take 5 mg by mouth daily.   Marland Kitchen losartan (COZAAR) 100 MG tablet Take 1 tablet (100 mg total) by mouth every morning.  . Multiple Vitamin (MULTIVITAMIN) tablet Take 1 tablet by mouth daily.   . predniSONE (DELTASONE) 20 MG tablet Take 1 tablet (20 mg total) by mouth See admin instructions. 1 tab 3 x day for 3 days, then 1 tab 2 x day for 3 days, then 1 tab 1 x day for 5 days  . predniSONE (DELTASONE) 5 MG tablet Take 5 mg by mouth every morning. Mondays, wednesdays, fridays, and saturdays  . terazosin (HYTRIN) 10 MG capsule Take 1 capsule (10 mg total) by mouth every morning.  . Vitamin D, Ergocalciferol, (DRISDOL) 50000 UNITS CAPS capsule TAKE 1 CAPSULE BY MOUTH DAILY OR AS DIRECTED   Current Problems (verified) Patient Active Problem List   Diagnosis Date Noted  . Vitamin D deficiency 04/15/2014  . Medication management 04/15/2014  . Prostate cancer screening 04/15/2014  . Screening for rectal cancer 04/15/2014  . Essential hypertension 04/08/2013  . Mixed hyperlipidemia 04/08/2013  . Prediabetes 04/08/2013  . GERD (gastroesophageal reflux disease)   . DJD (degenerative joint disease)   . IBS (irritable bowel syndrome)   . COPD (chronic obstructive pulmonary disease)   . BPH (benign prostatic hypertrophy)   . Asthma   . Rheumatoid arthritis 04/20/2011    Screening Tests Health Maintenance  Topic Date Due  . COLONOSCOPY  03/24/1983  .  TETANUS/TDAP  05/15/2012  . INFLUENZA VACCINE  12/13/2013  . PNEUMOCOCCAL POLYSACCHARIDE VACCINE AGE 27 AND OVER  Completed  . ZOSTAVAX  Completed    Immunization History  Administered Date(s) Administered  . DT 04/15/2014  . Influenza, High Dose Seasonal PF 04/15/2014  . Influenza-Unspecified 03/10/2013  . Pneumococcal Conjugate-13 04/15/2014  . Pneumococcal Polysaccharide-23 05/15/2001, 03/30/2011  . Td 05/15/2002  . Zoster 04/01/2012    Preventative care: Last colonoscopy: May 2011  Prior vaccinations: TD : 04/15/2014  Influenza: HD 04/15/2014  Pneumococcal: 03/30/2011 Prevnar: 04/15/2014 Shingles/Zostavax: 04/01/2012  History reviewed: allergies, current medications, past family history, past medical history, past social history, past surgical history and problem list  Risk Factors: Tobacco History  Substance Use Topics  . Smoking status: Former Smoker    Quit date: 07/11/1980  . Smokeless tobacco: Former Systems developer    Types: Chew  . Alcohol Use: No   He does not smoke.  Patient is a former smoker. Are there smokers in your home (other than you)?  No  Alcohol Current alcohol use: none  Caffeine Current caffeine use: coffee 2 /day and caffeinated soft drinks 2 /day  Exercise Current exercise: walking and yard work  Nutrition/Diet Current diet: in general, an "unhealthy" diet  Cardiac risk factors: advanced age (older than 38 for  men, 62 for women), dyslipidemia, hypertension, male gender, obesity (BMI = 33.16) and sedentary lifestyle.  Depression Screen (Note: if answer to either of the following is "Yes", a more complete depression screening is indicated)   Q1: Over the past two weeks, have you felt down, depressed or hopeless? No  Q2: Over the past two weeks, have you felt little interest or pleasure in doing things? No  Have you lost interest or pleasure in daily life? No  Do you often feel hopeless? No  Do you cry easily over simple problems?  No  Activities of Daily Living In your present state of health, do you have any difficulty performing the following activities?:  Driving? No Managing money?  No Feeding yourself? No Getting from bed to chair? No Climbing a flight of stairs? No Preparing food and eating?: No Bathing or showering? No Getting dressed: No Getting to the toilet? No Using the toilet:No Moving around from place to place: No In the past year have you fallen or had a near fall?:No   Are you sexually active?  Yes  Do you have more than one partner?  No  Vision Difficulties: No  Hearing Difficulties: No Do you often ask people to speak up or repeat themselves? No Do you experience ringing or noises in your ears? No Do you have difficulty understanding soft or whispered voices? No  Cognition  Do you feel that you have a problem with memory?No  Do you often misplace items? No  Do you feel safe at home?  Yes  Advanced directives Does patient have a Declo? No - offered forms Does patient have a Living Will? No  In addition to the HPI above,  No Fever-chills,  No Headache, No changes with Vision or hearing,  No problems swallowing food or Liquids,  No Chest pain or productive Cough,  No Abdominal pain, No Nausea or Vomitting, Bowel movements are regular,  No Blood in stool or Urine,  No dysuria,  No new skin rashes or bruises,  No new joints pains-aches,  No new weakness, tingling, numbness in any extremity,  No recent weight loss,  No polyuria, polydypsia or polyphagia,  No significant Mental Stressors.  A full 10 point Review of Systems was done, except as stated above, all other Review of Systems were negative    Objective:   BP 136/74,      pulse 80,       temp 97.9 F ,       resp 18,        ht 6' 4.5" ,       wt 276 lb       BMI   33.16   General appearance: alert, no distress, WD/WN, male Cognitive Testing  Alert? Yes  Normal Appearance? Yes  Oriented to  person? Yes  Place? Yes   Time? Yes  Recall of three objects?  Yes  Can perform simple calculations? Yes  Displays appropriate judgment? Yes  Can read the correct time from a watch/clock? Yes  HEENT: normocephalic, sclerae anicteric, TMs pearly, nares patent, no discharge or erythema, pharynx normal Oral cavity: MMM, no lesions Neck: supple, no lymphadenopathy, no thyromegaly, no masses Heart: RRR, normal S1, S2, no murmurs Lungs: CTA bilaterally, no wheezes, rhonchi, or rales Abdomen: +bs, soft, non tender, non distended, no masses, no hepatomegaly, no splenomegaly GU: deferred to Dr Jasmine December -upcoming appt.  Musculoskeletal: nontender, no swelling, no obvious deformity Extremities: no edema, no cyanosis, no  clubbing Pulses: 2+ symmetric, upper and lower extremities, normal cap refill Neurological: alert, oriented x 3, CN2-12 intact, strength normal upper extremities and lower extremities, sensation normal throughout, DTRs 2+ throughout, no cerebellar signs, gait normal Psychiatric: normal affect, behavior normal, pleasant   Medicare Attestation I have personally reviewed: The patient's medical and social history Their use of alcohol, tobacco or illicit drugs Their current medications and supplements The patient's functional ability including ADLs,fall risks, home safety risks, cognitive, and hearing and visual impairment Diet and physical activities Evidence for depression or mood disorders  The patient's weight, height, BMI, and visual acuity have been recorded in the chart.  I have made referrals, counseling, and provided education to the patient based on review of the above and I have provided the patient with a written personalized care plan for preventive services.    Obadiah Dennard DAVID, MD   04/15/2014

## 2014-04-15 NOTE — Patient Instructions (Signed)

## 2014-04-16 LAB — LIPID PANEL
CHOL/HDL RATIO: 5.4 ratio
CHOLESTEROL: 152 mg/dL (ref 0–200)
HDL: 28 mg/dL — AB (ref >39)
LDL Cholesterol: 85 mg/dL (ref 0–99)
Triglycerides: 197 mg/dL — ABNORMAL HIGH (ref <150)
VLDL: 39 mg/dL (ref 0–40)

## 2014-04-16 LAB — BASIC METABOLIC PANEL WITH GFR
BUN: 18 mg/dL (ref 6–23)
CO2: 27 meq/L (ref 19–32)
Calcium: 8.9 mg/dL (ref 8.4–10.5)
Chloride: 107 mEq/L (ref 96–112)
Creat: 1.27 mg/dL (ref 0.50–1.35)
GFR, EST AFRICAN AMERICAN: 61 mL/min
GFR, Est Non African American: 53 mL/min — ABNORMAL LOW
GLUCOSE: 96 mg/dL (ref 70–99)
POTASSIUM: 4 meq/L (ref 3.5–5.3)
SODIUM: 142 meq/L (ref 135–145)

## 2014-04-16 LAB — VITAMIN B12: Vitamin B-12: 548 pg/mL (ref 211–911)

## 2014-04-16 LAB — URINALYSIS, MICROSCOPIC ONLY
Bacteria, UA: NONE SEEN
CASTS: NONE SEEN
SQUAMOUS EPITHELIAL / LPF: NONE SEEN

## 2014-04-16 LAB — MICROALBUMIN / CREATININE URINE RATIO
Creatinine, Urine: 378.1 mg/dL
Microalb Creat Ratio: 30.4 mg/g — ABNORMAL HIGH (ref 0.0–30.0)
Microalb, Ur: 11.5 mg/dL — ABNORMAL HIGH (ref <2.0)

## 2014-04-16 LAB — MAGNESIUM: MAGNESIUM: 1.8 mg/dL (ref 1.5–2.5)

## 2014-04-16 LAB — HEPATIC FUNCTION PANEL
ALT: 24 U/L (ref 0–53)
AST: 22 U/L (ref 0–37)
Albumin: 3.6 g/dL (ref 3.5–5.2)
Alkaline Phosphatase: 73 U/L (ref 39–117)
BILIRUBIN DIRECT: 0.1 mg/dL (ref 0.0–0.3)
Indirect Bilirubin: 0.6 mg/dL (ref 0.2–1.2)
Total Bilirubin: 0.7 mg/dL (ref 0.2–1.2)
Total Protein: 6.5 g/dL (ref 6.0–8.3)

## 2014-04-16 LAB — PSA: PSA: 0.38 ng/mL (ref <=4.00)

## 2014-04-16 LAB — INSULIN, FASTING: Insulin fasting, serum: 29 u[IU]/mL — ABNORMAL HIGH (ref 2.0–19.6)

## 2014-04-16 LAB — HEMOGLOBIN A1C
Hgb A1c MFr Bld: 5.9 % — ABNORMAL HIGH (ref <5.7)
Mean Plasma Glucose: 123 mg/dL — ABNORMAL HIGH (ref <117)

## 2014-04-16 LAB — VITAMIN D 25 HYDROXY (VIT D DEFICIENCY, FRACTURES): VIT D 25 HYDROXY: 70 ng/mL (ref 30–100)

## 2014-04-16 LAB — TSH: TSH: 1.69 u[IU]/mL (ref 0.350–4.500)

## 2014-05-11 ENCOUNTER — Other Ambulatory Visit (INDEPENDENT_AMBULATORY_CARE_PROVIDER_SITE_OTHER): Payer: Medicare Other

## 2014-05-11 DIAGNOSIS — Z1212 Encounter for screening for malignant neoplasm of rectum: Secondary | ICD-10-CM

## 2014-05-11 LAB — POC HEMOCCULT BLD/STL (HOME/3-CARD/SCREEN)
Card #2 Fecal Occult Blod, POC: NEGATIVE
Card #3 Fecal Occult Blood, POC: NEGATIVE
Fecal Occult Blood, POC: NEGATIVE

## 2014-05-12 ENCOUNTER — Other Ambulatory Visit: Payer: Self-pay | Admitting: Internal Medicine

## 2014-05-20 ENCOUNTER — Other Ambulatory Visit: Payer: Self-pay | Admitting: Dermatology

## 2014-07-15 ENCOUNTER — Ambulatory Visit: Payer: Self-pay | Admitting: Physician Assistant

## 2014-08-03 ENCOUNTER — Other Ambulatory Visit: Payer: Self-pay | Admitting: Internal Medicine

## 2014-08-06 ENCOUNTER — Other Ambulatory Visit: Payer: Self-pay | Admitting: Internal Medicine

## 2014-08-21 ENCOUNTER — Other Ambulatory Visit: Payer: Self-pay | Admitting: Dermatology

## 2014-09-15 ENCOUNTER — Other Ambulatory Visit: Payer: Self-pay | Admitting: Internal Medicine

## 2014-10-15 ENCOUNTER — Encounter: Payer: Self-pay | Admitting: Internal Medicine

## 2014-10-15 ENCOUNTER — Ambulatory Visit (INDEPENDENT_AMBULATORY_CARE_PROVIDER_SITE_OTHER): Payer: Medicare Other | Admitting: Internal Medicine

## 2014-10-15 VITALS — BP 136/68 | HR 72 | Temp 97.5°F | Resp 16 | Ht 76.0 in | Wt 278.4 lb

## 2014-10-15 DIAGNOSIS — R7309 Other abnormal glucose: Secondary | ICD-10-CM

## 2014-10-15 DIAGNOSIS — Z79899 Other long term (current) drug therapy: Secondary | ICD-10-CM

## 2014-10-15 DIAGNOSIS — R7303 Prediabetes: Secondary | ICD-10-CM

## 2014-10-15 DIAGNOSIS — I10 Essential (primary) hypertension: Secondary | ICD-10-CM

## 2014-10-15 DIAGNOSIS — E559 Vitamin D deficiency, unspecified: Secondary | ICD-10-CM

## 2014-10-15 DIAGNOSIS — J4520 Mild intermittent asthma, uncomplicated: Secondary | ICD-10-CM

## 2014-10-15 DIAGNOSIS — E782 Mixed hyperlipidemia: Secondary | ICD-10-CM

## 2014-10-15 DIAGNOSIS — J410 Simple chronic bronchitis: Secondary | ICD-10-CM

## 2014-10-15 LAB — HEPATIC FUNCTION PANEL
ALT: 25 U/L (ref 0–53)
AST: 24 U/L (ref 0–37)
Albumin: 4 g/dL (ref 3.5–5.2)
Alkaline Phosphatase: 77 U/L (ref 39–117)
BILIRUBIN INDIRECT: 0.6 mg/dL (ref 0.2–1.2)
Bilirubin, Direct: 0.1 mg/dL (ref 0.0–0.3)
Total Bilirubin: 0.7 mg/dL (ref 0.2–1.2)
Total Protein: 6.8 g/dL (ref 6.0–8.3)

## 2014-10-15 LAB — BASIC METABOLIC PANEL WITH GFR
BUN: 18 mg/dL (ref 6–23)
CHLORIDE: 104 meq/L (ref 96–112)
CO2: 29 mEq/L (ref 19–32)
CREATININE: 1.23 mg/dL (ref 0.50–1.35)
Calcium: 8.9 mg/dL (ref 8.4–10.5)
GFR, Est African American: 63 mL/min
GFR, Est Non African American: 55 mL/min — ABNORMAL LOW
GLUCOSE: 87 mg/dL (ref 70–99)
POTASSIUM: 4.2 meq/L (ref 3.5–5.3)
Sodium: 140 mEq/L (ref 135–145)

## 2014-10-15 LAB — LIPID PANEL
CHOLESTEROL: 168 mg/dL (ref 0–200)
HDL: 28 mg/dL — AB (ref >=40)
LDL Cholesterol: 73 mg/dL (ref 0–99)
TRIGLYCERIDES: 333 mg/dL — AB (ref <150)
Total CHOL/HDL Ratio: 6 Ratio
VLDL: 67 mg/dL — AB (ref 0–40)

## 2014-10-15 LAB — CBC WITH DIFFERENTIAL/PLATELET
BASOS PCT: 0 % (ref 0–1)
Basophils Absolute: 0 10*3/uL (ref 0.0–0.1)
EOS ABS: 0.2 10*3/uL (ref 0.0–0.7)
Eosinophils Relative: 2 % (ref 0–5)
HEMATOCRIT: 42.6 % (ref 39.0–52.0)
Hemoglobin: 14.4 g/dL (ref 13.0–17.0)
LYMPHS PCT: 29 % (ref 12–46)
Lymphs Abs: 2.4 10*3/uL (ref 0.7–4.0)
MCH: 30.9 pg (ref 26.0–34.0)
MCHC: 33.8 g/dL (ref 30.0–36.0)
MCV: 91.4 fL (ref 78.0–100.0)
MONO ABS: 0.9 10*3/uL (ref 0.1–1.0)
MONOS PCT: 11 % (ref 3–12)
MPV: 9.9 fL (ref 8.6–12.4)
NEUTROS ABS: 4.8 10*3/uL (ref 1.7–7.7)
NEUTROS PCT: 58 % (ref 43–77)
Platelets: 253 10*3/uL (ref 150–400)
RBC: 4.66 MIL/uL (ref 4.22–5.81)
RDW: 13.8 % (ref 11.5–15.5)
WBC: 8.2 10*3/uL (ref 4.0–10.5)

## 2014-10-15 LAB — MAGNESIUM: MAGNESIUM: 2 mg/dL (ref 1.5–2.5)

## 2014-10-15 LAB — TSH: TSH: 1.778 u[IU]/mL (ref 0.350–4.500)

## 2014-10-15 NOTE — Patient Instructions (Addendum)

## 2014-10-15 NOTE — Progress Notes (Signed)
Patient ID: [name], male   DOB: 1930/08/27, 81 y.o.   MRN: [id number]   Annual Comprehensive Examination  This very nice 81 y.o.  MWM presents for complete physical.  Patient has been followed for HTN, Prediabetes, Hyperlipidemia, and Vitamin D Deficiency. Patient was dx'd with Rh Arthritis  in May 2010 by Dr Gavin Pound & seems to be doing well on low dose alternate day prednisone.     HTN predates since 1999. Patient's BP has been controlled at home. Today's BP: 136/68 mmHg. Patient denies any cardiac symptoms as chest pain, palpitations, shortness of breath, dizziness or ankle swelling. Patient has mild COPD as demonstrated by CXR and continues to report DOE with minimal activities altho his resting O2 sat is 94-96% on Rm air today. He reports he has lost his rescue inhaler . He reports noting occasional mild wheezing at night and dry cough, but denies Orthopnea or PND type sx's.    Patient's hyperlipidemia is controlled with diet and medications. Patient denies myalgias or other medication SE's. Last lipids were at goal - Total  Chol152; HDL 28; LDL 85; Triglycerides 197 on 04/15/2014   Patient has prediabetes & Insulin Resistance since Oct 2013 with A1c 5.7% and elevated Insulin 42 (Nl<20)  and patient denies reactive hypoglycemic symptoms, visual blurring, diabetic polys or paresthesias. Last A1c was 5.9% on 04/15/2014.   Finally, patient has history of Vitamin D Deficiency of 25 in 2008 and last vitamin D was 70 on 04/15/2014.    Medication Sig  . finasteride (PROSCAR) 5 MG tablet Take 5 mg by mouth daily.   Marland Kitchen losartan (COZAAR) 100 MG tablet TAKE 1 BY MOUTH EVERY MORNING  . Multiple Vitamin (MULTIVITAMIN) tablet Take 1 tablet by mouth daily.   . predniSONE (DELTASONE) 5 MG tablet Take 5 mg by mouth every morning. Mondays, wednesdays, fridays, and saturdays  . terazosin (HYTRIN) 10 MG capsule TAKE 1 BY MOUTH EVERY MORNING  . predniSONE (DELTASONE) 20 MG tablet Take 1 tablet (20 mg  total) by mouth See admin instructions. 1 tab 3 x day for 3 days, then 1 tab 2 x day for 3 days, then 1 tab 1 x day for 5 days  . Vitamin D  50,000 UNITS CAPS TAKE ONE CAPSULE DAILY OR AS DIRECTED   Allergies  Allergen Reactions  . Ace Inhibitors   . Asa [Aspirin]     High Dose asprin  . Penicillins Hives  . Vasotec [Enalapril]     Cough  . Clindamycin/Lincomycin     Hives    Past Medical History  Diagnosis Date  . Rheumatoid arthritis(714.0)     DR. HAWKES  . BPH (benign prostatic hypertrophy)   . Scrotal lesion   . Short of breath on exertion   . RBBB   . History of urinary retention   . Hypertension   . GERD (gastroesophageal reflux disease)   . DJD (degenerative joint disease)   . IBS (irritable bowel syndrome)   . Allergy   . COPD (chronic obstructive pulmonary disease)   . BPH (benign prostatic hypertrophy)   . Asthma    Health Maintenance  Topic Date Due  . COLONOSCOPY  03/24/1983  . TETANUS/TDAP  05/15/2012  . INFLUENZA VACCINE  12/14/2014  . ZOSTAVAX  Completed  . PNA vac Low Risk Adult  Completed   Immunization History  Administered Date(s) Administered  . DT 04/15/2014  . Influenza, High Dose Seasonal PF 04/15/2014  . Influenza-Unspecified 03/10/2013  . Pneumococcal Conjugate-13  04/15/2014  . Pneumococcal Polysaccharide-23 05/15/2001, 03/30/2011  . Td 05/15/2002  . Zoster 04/01/2012   Past Surgical History  Procedure Laterality Date  . Transurethral resection of prostate  SEVERAL YRS AGO  . Transthoracic echocardiogram  04-26-2011      MILD LVH/ LVSF NORMAL/ EF 99-35%/ GRADE I DIASTOLIC DYSFUNCTION/ MILD MITRAL REGURG.  . Cardiovascular stress test  04-26-2011  DR BRACKBILL    LOW RISK STRESS NUCLEAR STUDY/ NO EVIDENCE ISCHEMIA  . Lumbar fusion  X3  LAST ONE 1996  . Scrotal exploration Left 07/17/2012    Procedure:  EXCISION  OF LEFT SCROTAL SKIN LESION;  Surgeon: Molli Hazard, MD;  Location[facility];  Service: Urology;   Laterality: Left;   Family History  Problem Relation Age of Onset  . Heart disease Father   . Colon cancer Father   . Hypertension Father   . Diabetes Sister   . Heart disease Brother    History   Social History  . Marital Status: Married    Spouse Name: N/A  . Number of Children: N/A  . Years of Education: N/A   Occupational History  . RETIRED    Social History Main Topics  . Smoking status: Former Smoker    Quit date: 07/11/1980  . Smokeless tobacco: Former Systems developer    Types: Chew  . Alcohol Use: No  . Drug Use: No  . Sexual Activity: Not on file    ROS Constitutional: Denies fever, chills, weight loss/gain, headaches, insomnia,  night sweats or change in appetite. Does c/o fatigue. Eyes: Denies redness, blurred vision, diplopia, discharge, itchy or watery eyes.  ENT: Denies discharge, congestion, post nasal drip, epistaxis, sore throat, earache, hearing loss, dental pain, Tinnitus, Vertigo, Sinus pain or snoring.  Cardio: Denies chest pain, palpitations, irregular heartbeat, syncope, dyspnea, diaphoresis, orthopnea, PND, claudication or edema Respiratory: das above.  Gastrointestinal: Denies dysphagia, heartburn, reflux, water brash, pain, cramps, nausea, vomiting, bloating, diarrhea, constipation, hematemesis, melena, hematochezia, jaundice or hemorrhoids Genitourinary: Denies dysuria, frequency, urgency, nocturia, hesitancy, discharge, hematuria or flank pain Musculoskeletal: Denies arthralgia, myalgia, stiffness, Jt. Swelling, pain, limp or strain/sprain. Denies Falls. Skin: Denies puritis, rash, hives, warts, acne, eczema or change in skin lesion Neuro: No weakness, tremor, incoordination, spasms, paresthesia or pain Psychiatric: Denies confusion, memory loss or sensory loss. Denies Depression. Endocrine: Denies change in weight, skin, hair change, nocturia, and paresthesia, diabetic polys, visual blurring or hyper / hypo glycemic episodes.  Heme/Lymph: No excessive  bleeding, bruising or enlarged lymph nodes.  Physical Exam  BP 136/68   Pulse 72  Temp 97.5 F   Resp 16  Ht 6\' 4"    Wt 278 lb 6.4 oz    BMI 33.90  General Appearance: Well nourished, in no apparent distress. Eyes: PERRLA, EOMs, conjunctiva no swelling or erythema, normal fundi and vessels. Sinuses: No frontal/maxillary tenderness ENT/Mouth: EACs patent / TMs  nl. Nares clear without erythema, swelling, mucoid exudates. Oral hygiene is good. No erythema, swelling, or exudate. Tongue normal, non-obstructing. Tonsils not swollen or erythematous. Hearing normal.  Neck: Supple, thyroid normal. No bruits, nodes or JVD. Respiratory: Respiratory effort normal.  BS equal and clear bilateral without rales, rhonci, wheezing or stridor. Cardio: Heart sounds are normal with regular rate and rhythm and no murmurs, rubs or gallops. Peripheral pulses are normal and equal bilaterally without edema. No aortic or femoral bruits. Chest: symmetric with normal excursions and percussion.  Abdomen: Protuberant &  soft with bowel sounds active. Nontender, no guarding,  rebound, hernias, masses, or organomegaly.  Lymphatics: Non tender without lymphadenopathy.  Genitourinary: No hernias.Testes nl. DRE - prostate nl for age - smooth & firm w/o nodules. Musculoskeletal: Full ROM all peripheral extremities, joint stability, 5/5 strength, and normal gait. Skin: Warm and dry without rashes, lesions, cyanosis, clubbing or  ecchymosis.  Neuro: Cranial nerves intact, reflexes equal bilaterally. Normal muscle tone, no cerebellar symptoms. Sensation intact.  Pysch: Awake and oriented X 3 with normal affect, insight and judgment appropriate.   Assessment and Plan  1. Essential hypertension  - Microalbumin / creatinine urine ratio - EKG 12-Lead - [place], RETROPERITNL ABD,  LTD - TSH  2. Mixed hyperlipidemia  - Lipid panel  3. Prediabetes  - Hemoglobin A1c - Insulin, random  4. Vitamin D deficiency  - Vit D  25  hydroxy (rtn osteoporosis monitoring)  5. Screening for rectal cancer  - POC Hemoccult Bld/Stl (3-Cd Home Screen); Future  6. Prostate cancer screening  - PSA  7. BPH (benign prostatic hypertrophy)   8. Primary osteoarthritis involving multiple joints   9. IBS (irritable bowel syndrome)   10. Simple chronic bronchitis   11. Medication management  - Urine Microscopic - CBC with Differential/Platelet - BASIC METABOLIC PANEL WITH GFR - Hepatic function panel - Magnesium  12. Asthma   - Instruction in use of Spacer w/ MDI  - Given # 2 sx Dulera-200 with #60 doses each and to begin 1 inhalation bid (=2 month supply)    Continue prudent diet as discussed, weight control, BP monitoring, regular exercise, and medications as discussed.  Discussed med effects and SE's. Routine screening labs and tests as requested with regular follow-up as recommended.  Over 40 minutes of exam, counseling &  chart review was performed

## 2014-10-16 LAB — INSULIN, RANDOM: Insulin: 21.3 u[IU]/mL — ABNORMAL HIGH (ref 2.0–19.6)

## 2014-10-16 LAB — HEMOGLOBIN A1C
Hgb A1c MFr Bld: 5.8 % — ABNORMAL HIGH (ref <5.7)
MEAN PLASMA GLUCOSE: 120 mg/dL — AB (ref <117)

## 2014-10-16 LAB — VITAMIN D 25 HYDROXY (VIT D DEFICIENCY, FRACTURES): VIT D 25 HYDROXY: 61 ng/mL (ref 30–100)

## 2015-01-20 ENCOUNTER — Ambulatory Visit: Payer: Self-pay | Admitting: Internal Medicine

## 2015-01-26 ENCOUNTER — Other Ambulatory Visit: Payer: Self-pay | Admitting: Internal Medicine

## 2015-01-28 ENCOUNTER — Other Ambulatory Visit: Payer: Self-pay | Admitting: *Deleted

## 2015-01-28 MED ORDER — VITAMIN D (ERGOCALCIFEROL) 1.25 MG (50000 UNIT) PO CAPS: ORAL_CAPSULE | ORAL | Status: DC

## 2015-02-16 ENCOUNTER — Ambulatory Visit (INDEPENDENT_AMBULATORY_CARE_PROVIDER_SITE_OTHER): Payer: Medicare Other | Admitting: Physician Assistant

## 2015-02-16 ENCOUNTER — Encounter: Payer: Self-pay | Admitting: Physician Assistant

## 2015-02-16 VITALS — BP 140/58 | HR 79 | Temp 97.5°F | Resp 14 | Ht 76.0 in | Wt 277.0 lb

## 2015-02-16 DIAGNOSIS — J209 Acute bronchitis, unspecified: Secondary | ICD-10-CM | POA: Diagnosis not present

## 2015-02-16 DIAGNOSIS — J410 Simple chronic bronchitis: Secondary | ICD-10-CM | POA: Diagnosis not present

## 2015-02-16 DIAGNOSIS — Z23 Encounter for immunization: Secondary | ICD-10-CM

## 2015-02-16 MED ORDER — AZITHROMYCIN 250 MG PO TABS
ORAL_TABLET | ORAL | Status: AC
Start: 2015-02-16 — End: 2015-02-21

## 2015-02-16 MED ORDER — UMECLIDINIUM-VILANTEROL 62.5-25 MCG/INH IN AEPB: INHALATION_SPRAY | RESPIRATORY_TRACT | Status: DC

## 2015-02-16 MED ORDER — PREDNISONE 20 MG PO TABS: ORAL_TABLET | ORAL | Status: DC

## 2015-02-16 MED ORDER — PROMETHAZINE-CODEINE 6.25-10 MG/5ML PO SYRP: 5.0000 mL | ORAL_SOLUTION | Freq: Four times a day (QID) | ORAL | Status: DC | PRN

## 2015-02-16 NOTE — Progress Notes (Signed)
Subjective:    Patient ID: [name], male    DOB: 01/27/1931, 81 y.o.   MRN: [id number]  HPI 81 y.o. WM with history of COPD, HTN, chol presents with cough x 5 days. Has not tried anything over the counter.  Cough with yellow mucus, SOB, some chest discomfort with cough only.  Denies fever, chills, sinus symptoms.   Blood pressure 140/58, pulse 79, temperature 97.5 F (36.4 C), temperature source Temporal, resp. rate 14, height 6\' 4"  (1.93 m), weight 277 lb (125.646 kg), SpO2 96 %.   Wt Readings from Last 3 Encounters:  02/16/15 277 lb (125.646 kg)  10/15/14 278 lb 6.4 oz (126.281 kg)  04/15/14 276 lb (125.193 kg)    Current Outpatient Prescriptions on File Prior to Visit  Medication Sig Dispense Refill  . finasteride (PROSCAR) 5 MG tablet Take 5 mg by mouth daily.     Marland Kitchen losartan (COZAAR) 100 MG tablet TAKE 1 BY MOUTH EVERY MORNING 90 tablet 0  . Multiple Vitamin (MULTIVITAMIN) tablet Take 1 tablet by mouth daily.     . predniSONE (DELTASONE) 5 MG tablet Take 5 mg by mouth every morning. Mondays, wednesdays, fridays, and saturdays    . terazosin (HYTRIN) 10 MG capsule TAKE 1 BY MOUTH EVERY MORNING 90 capsule 0  . Vitamin D, Ergocalciferol, (DRISDOL) 50000 UNITS CAPS capsule TAKE ONE CAPSULE DAILY OR AS DIRECTED 90 capsule 3   No current facility-administered medications on file prior to visit.    Past Medical History  Diagnosis Date  . Rheumatoid arthritis(714.0)     DR. HAWKES  . BPH (benign prostatic hypertrophy)   . Scrotal lesion   . Short of breath on exertion   . RBBB   . History of urinary retention   . Hypertension   . GERD (gastroesophageal reflux disease)   . DJD (degenerative joint disease)   . IBS (irritable bowel syndrome)   . Allergy   . COPD (chronic obstructive pulmonary disease) ([place])   . BPH (benign prostatic hypertrophy)   . Asthma     Review of Systems  Constitutional: Negative for fever, chills and diaphoresis.  HENT: Negative for  congestion, ear pain, postnasal drip, rhinorrhea, sinus pressure, sneezing, sore throat, trouble swallowing and voice change.   Eyes: Negative.   Respiratory: Positive for cough and shortness of breath. Negative for chest tightness and wheezing.   Cardiovascular: Positive for chest pain (with coughing). Negative for palpitations and leg swelling.  Gastrointestinal: Negative.   Genitourinary: Negative.   Musculoskeletal: Negative.  Negative for neck pain.  Neurological: Negative.  Negative for headaches.       Objective:   Physical Exam  Constitutional: He is oriented to person, place, and time. He appears well-developed and well-nourished.  HENT:  Head: Normocephalic and atraumatic.  Right Ear: External ear normal.  Left Ear: External ear normal.  Nose: Nose normal.  Mouth/Throat: Oropharynx is clear and moist.  Eyes: Conjunctivae are normal. Pupils are equal, round, and reactive to light.  Neck: Normal range of motion. Neck supple.  Cardiovascular: Normal rate, regular rhythm and normal heart sounds.   No murmur heard. Pulmonary/Chest: Effort normal. No respiratory distress. He has wheezes (LLL). He has no rales. He exhibits no tenderness.  Abdominal: Soft. Bowel sounds are normal.  Lymphadenopathy:    He has no cervical adenopathy.  Neurological: He is alert and oriented to person, place, and time.  Skin: Skin is warm and dry.      Assessment & Plan:  1. Acute bronchitis, unspecified organism [J20.9] Go to the ER if any CP, SOB, nausea, dizziness, severe HA, changes vision/speech - azithromycin (ZITHROMAX) 250 MG tablet; Take 2 tablets (500 mg) on  Day 1,  followed by 1 tablet (250 mg) once daily on Days 2 through 5.  Dispense: 6 each; Refill: 1 - predniSONE (DELTASONE) 20 MG tablet; 2 tablets daily for 3 days, 1 tablet daily for 4 days.  Dispense: 10 tablet; Refill: 0 - promethazine-codeine (PHENERGAN WITH CODEINE) 6.25-10 MG/5ML syrup; Take 5 mLs by mouth every 6 (six) hours  as needed for cough. Max: 1mL per day  Dispense: 240 mL; Refill: 0 -Anoro samples given with 30 days supply  2. Need for prophylactic vaccination and inoculation against influenza - Flu vaccine HIGH DOSE PF (Fluzone High dose)    Future Appointments Date Time Provider [place]  05/19/2015 11:15 AM Unk Pinto, MD [facility] None

## 2015-02-16 NOTE — Patient Instructions (Signed)
I will give you a prescription for an antibiotic, but please only take it if you are not feeling better in 7-10 days.  Bronchitis is mostly caused by viruses and the antibiotic will do nothing.  PLEASE TRY TO DO OVER THE COUNTER TREATMENT AND/OR PREDNISONE FOR 5-7 DAYS AND IF YOU ARE NOT GETTING BETTER OR GETTING WORSE THEN YOU CAN START ON AN ANTIBIOTIC GIVEN.  Can take the prednisone AT NIGHT WITH DINNER, it take 8-12 hours to start working so it will NOT affect your sleeping if you take it at night with your food!! Take two pills the first night and 1 or two pill the second night and then 1 pill the other nights.    Rest and stay hydrated.  Make sure you drink plenty of fluids to make sure urine is clear when you urinate.  Water will help thin out mucous. - Take Mucinex DM- Maximum Strength over the counter to thin out and cough up the thick mucous.  Please follow directions on box. -Take Albuterol if prescribed.  Risk of antibiotic use: About 1 in 4 people who take antibiotics have side effects including stomach problems, dizziness, or rashes. Those problems clear up soon after stopping the drugs, but in rare cases antibiotics can cause severe allergic reaction. Over use of antibiotics also encourages the growth of bacteria that can't be controlled easily with drugs. That makes you more vunerable to antibiotic-resistant infections and undermines the benefits of antibiotics for others.   Waste of Money: Antibiotics often aren't very expensive, but any money spent on unnecessary drugs is money down the drain.   When are antibiotics needed? Only when symptoms last longer than a week.  Start to improve but then worsen again  Please call the office or message through My Chart if you have any questions.   Acute Bronchitis Bronchitis is when the airways that extend from the windpipe into the lungs get red, puffy, and painful (inflamed). Bronchitis often causes thick spit (mucus) to develop. This  leads to a cough. A cough is the most common symptom of bronchitis. In acute bronchitis, the condition usually begins suddenly and goes away over time (usually in 2 weeks). Smoking, allergies, and asthma can make bronchitis worse. Repeated episodes of bronchitis may cause more lung problems.  Most common cause of Bronchitis is viruses (rhinovirus, coronavirus, RSV).  Therefore, not requiring an antibiotic; as antibiotics only treat bacterial infections.  HOME CARE  Rest.  Drink enough fluids to keep your pee (urine) clear or pale yellow (unless you need to limit fluids as told by your doctor).  Only take over-the-counter or prescription medicines as told by your doctor.  Avoid smoking and secondhand smoke. These can make bronchitis worse. If you are a smoker, think about using nicotine gum or skin patches. Quitting smoking will help your lungs heal faster.  Reduce the chance of getting bronchitis again by:  Washing your hands often.  Avoiding people with cold symptoms.  Trying not to touch your hands to your mouth, nose, or eyes.  Follow up with your doctor as told. GET HELP IF: Your symptoms do not improve after 1 week of treatment. Symptoms include:  Cough.  Fever.  Coughing up thick spit.  Body aches.  Chest congestion.  Chills.  Shortness of breath.  Sore throat. GET HELP RIGHT AWAY IF:   You have an increased fever.  You have chills.  You have severe shortness of breath.  You have bloody thick spit (sputum).    You throw up (vomit) often.  You lose too much body fluid (dehydration).  You have a severe headache.  You faint. MAKE SURE YOU:   Understand these instructions.  Will watch your condition.  Will get help right away if you are not doing well or get worse. Document Released: 10/18/2007 Document Revised: 01/01/2013 Document Reviewed: 10/22/2012 [facility] Patient Information 2015 [place], [place]. This information is not intended to replace  advice given to you by your health care provider. Make sure you discuss any questions you have with your health care provider.   Chronic Obstructive Pulmonary Disease Chronic obstructive pulmonary disease (COPD) is a common lung condition in which airflow from the lungs is limited. COPD is a general term that can be used to describe many different lung problems that limit airflow, including both chronic bronchitis and emphysema. If you have COPD, your lung function will probably never return to normal, but there are measures you can take to improve lung function and make yourself feel better.  CAUSES   Smoking (common).   Exposure to secondhand smoke.   Genetic problems.  Chronic inflammatory lung diseases or recurrent infections. SYMPTOMS   Shortness of breath, especially with physical activity.   Deep, persistent (chronic) cough with a large amount of thick mucus.   Wheezing.   Rapid breaths (tachypnea).   Gray or bluish discoloration (cyanosis) of the skin, especially in fingers, toes, or lips.   Fatigue.   Weight loss.   Frequent infections or episodes when breathing symptoms become much worse (exacerbations).   Chest tightness. DIAGNOSIS  Your health care provider will take a medical history and perform a physical examination to make the initial diagnosis. Additional tests for COPD may include:   Lung (pulmonary) function tests.  Chest X-ray.  CT scan.  Blood tests. TREATMENT  Treatment available to help you feel better when you have COPD includes:   Inhaler and nebulizer medicines. These help manage the symptoms of COPD and make your breathing more comfortable.  Supplemental oxygen. Supplemental oxygen is only helpful if you have a low oxygen level in your blood.   Exercise and physical activity. These are beneficial for nearly all people with COPD. Some people may also benefit from a pulmonary rehabilitation program. HOME CARE INSTRUCTIONS    Take all medicines (inhaled or pills) as directed by your health care provider.  Avoid over-the-counter medicines or cough syrups that dry up your airway (such as antihistamines) and slow down the elimination of secretions unless instructed otherwise by your health care provider.   If you are a smoker, the most important thing that you can do is stop smoking. Continuing to smoke will cause further lung damage and breathing trouble. Ask your health care provider for help with quitting smoking. He or she can direct you to community resources or hospitals that provide support.  Avoid exposure to irritants such as smoke, chemicals, and fumes that aggravate your breathing.  Use oxygen therapy and pulmonary rehabilitation if directed by your health care provider. If you require home oxygen therapy, ask your health care provider whether you should purchase a pulse oximeter to measure your oxygen level at home.   Avoid contact with individuals who have a contagious illness.  Avoid extreme temperature and humidity changes.  Eat healthy foods. Eating smaller, more frequent meals and resting before meals may help you maintain your strength.  Stay active, but balance activity with periods of rest. Exercise and physical activity will help you maintain your ability to do  things you want to do.  Preventing infection and hospitalization is very important when you have COPD. Make sure to receive all the vaccines your health care provider recommends, especially the pneumococcal and influenza vaccines. Ask your health care provider whether you need a pneumonia vaccine.  Learn and use relaxation techniques to manage stress.  Learn and use controlled breathing techniques as directed by your health care provider. Controlled breathing techniques include:   Pursed lip breathing. Start by breathing in (inhaling) through your nose for 1 second. Then, purse your lips as if you were going to whistle and breathe  out (exhale) through the pursed lips for 2 seconds.   Diaphragmatic breathing. Start by putting one hand on your abdomen just above your waist. Inhale slowly through your nose. The hand on your abdomen should move out. Then purse your lips and exhale slowly. You should be able to feel the hand on your abdomen moving in as you exhale.   Learn and use controlled coughing to clear mucus from your lungs. Controlled coughing is a series of short, progressive coughs. The steps of controlled coughing are:  1. Lean your head slightly forward.  2. Breathe in deeply using diaphragmatic breathing.  3. Try to hold your breath for 3 seconds.  4. Keep your mouth slightly open while coughing twice.  5. Spit any mucus out into a tissue.  6. Rest and repeat the steps once or twice as needed. SEEK MEDICAL CARE IF:   You are coughing up more mucus than usual.   There is a change in the color or thickness of your mucus.   Your breathing is more labored than usual.   Your breathing is faster than usual.  SEEK IMMEDIATE MEDICAL CARE IF:   You have shortness of breath while you are resting.   You have shortness of breath that prevents you from:  Being able to talk.   Performing your usual physical activities.   You have chest pain lasting longer than 5 minutes.   Your skin color is more cyanotic than usual.  You measure low oxygen saturations for longer than 5 minutes with a pulse oximeter. MAKE SURE YOU:   Understand these instructions.  Will watch your condition.  Will get help right away if you are not doing well or get worse. Document Released: 02/08/2005 Document Revised: 09/15/2013 Document Reviewed: 12/26/2012 [facility] Patient Information 2015 [place], [place]. This information is not intended to replace advice given to you by your health care provider. Make sure you discuss any questions you have with your health care provider.

## 2015-02-23 ENCOUNTER — Encounter: Payer: Self-pay | Admitting: Physician Assistant

## 2015-02-23 ENCOUNTER — Other Ambulatory Visit (HOSPITAL_COMMUNITY): Payer: Self-pay

## 2015-02-23 ENCOUNTER — Ambulatory Visit (INDEPENDENT_AMBULATORY_CARE_PROVIDER_SITE_OTHER): Payer: Medicare Other | Admitting: Physician Assistant

## 2015-02-23 ENCOUNTER — Ambulatory Visit (HOSPITAL_COMMUNITY)
Admission: RE | Admit: 2015-02-23 | Discharge: 2015-02-23 | Disposition: A | Discharge status: Home / Self Care | Payer: Medicare Other | Source: Ambulatory Visit | Attending: Physician Assistant | Admitting: Physician Assistant

## 2015-02-23 VITALS — BP 126/58 | HR 73 | Temp 97.7°F | Resp 14 | Ht 76.0 in | Wt 278.0 lb

## 2015-02-23 DIAGNOSIS — E669 Obesity, unspecified: Secondary | ICD-10-CM

## 2015-02-23 DIAGNOSIS — Z1389 Encounter for screening for other disorder: Secondary | ICD-10-CM

## 2015-02-23 DIAGNOSIS — Z79899 Other long term (current) drug therapy: Secondary | ICD-10-CM

## 2015-02-23 DIAGNOSIS — E782 Mixed hyperlipidemia: Secondary | ICD-10-CM | POA: Diagnosis not present

## 2015-02-23 DIAGNOSIS — E559 Vitamin D deficiency, unspecified: Secondary | ICD-10-CM

## 2015-02-23 DIAGNOSIS — E8881 Metabolic syndrome: Secondary | ICD-10-CM

## 2015-02-23 DIAGNOSIS — R05 Cough: Secondary | ICD-10-CM | POA: Insufficient documentation

## 2015-02-23 DIAGNOSIS — I10 Essential (primary) hypertension: Secondary | ICD-10-CM | POA: Diagnosis not present

## 2015-02-23 DIAGNOSIS — R0989 Other specified symptoms and signs involving the circulatory and respiratory systems: Secondary | ICD-10-CM | POA: Insufficient documentation

## 2015-02-23 DIAGNOSIS — J410 Simple chronic bronchitis: Secondary | ICD-10-CM | POA: Diagnosis not present

## 2015-02-23 DIAGNOSIS — R7303 Prediabetes: Secondary | ICD-10-CM | POA: Diagnosis not present

## 2015-02-23 DIAGNOSIS — Z1331 Encounter for screening for depression: Secondary | ICD-10-CM

## 2015-02-23 LAB — LIPID PANEL
CHOL/HDL RATIO: 4.7 ratio (ref <=5.0)
Cholesterol: 145 mg/dL (ref 125–200)
HDL: 31 mg/dL — ABNORMAL LOW (ref >=40)
LDL CALC: 66 mg/dL (ref <130)
Triglycerides: 241 mg/dL — ABNORMAL HIGH (ref <150)
VLDL: 48 mg/dL — ABNORMAL HIGH (ref <30)

## 2015-02-23 LAB — TSH: TSH: 1.453 u[IU]/mL (ref 0.350–4.500)

## 2015-02-23 LAB — HEPATIC FUNCTION PANEL
ALBUMIN: 3.5 g/dL — AB (ref 3.6–5.1)
ALT: 22 U/L (ref 9–46)
AST: 20 U/L (ref 10–35)
Alkaline Phosphatase: 72 U/L (ref 40–115)
Bilirubin, Direct: 0.1 mg/dL (ref <=0.2)
Indirect Bilirubin: 0.5 mg/dL (ref 0.2–1.2)
Total Bilirubin: 0.6 mg/dL (ref 0.2–1.2)
Total Protein: 5.9 g/dL — ABNORMAL LOW (ref 6.1–8.1)

## 2015-02-23 LAB — BASIC METABOLIC PANEL WITH GFR
BUN: 18 mg/dL (ref 7–25)
CHLORIDE: 102 mmol/L (ref 98–110)
CO2: 29 mmol/L (ref 20–31)
CREATININE: 1.24 mg/dL — AB (ref 0.70–1.11)
Calcium: 8.7 mg/dL (ref 8.6–10.3)
GFR, Est African American: 63 mL/min (ref >=60)
GFR, Est Non African American: 54 mL/min — ABNORMAL LOW (ref >=60)
Glucose, Bld: 93 mg/dL (ref 65–99)
Potassium: 3.6 mmol/L (ref 3.5–5.3)
Sodium: 140 mmol/L (ref 135–146)

## 2015-02-23 LAB — MAGNESIUM: Magnesium: 1.9 mg/dL (ref 1.5–2.5)

## 2015-02-23 MED ORDER — BENZONATATE 100 MG PO CAPS: 100.0000 mg | ORAL_CAPSULE | Freq: Three times a day (TID) | ORAL | Status: DC | PRN

## 2015-02-23 MED ORDER — LEVOFLOXACIN 500 MG PO TABS: 500.0000 mg | ORAL_TABLET | Freq: Every day | ORAL | Status: DC

## 2015-02-23 NOTE — Progress Notes (Signed)
Assessment and Plan:  1. Hypertension -Continue medication, monitor blood pressure at home. Continue DASH diet.  Reminder to go to the ER if any CP, SOB, nausea, dizziness, severe HA, changes vision/speech, left arm numbness and tingling and jaw pain.  2. Cholesterol -Continue diet and exercise. Check cholesterol.   3. Prediabetes  -Continue diet and exercise. Check A1C  4. Vitamin D Def - check level and continue medications.   5. Obesity with co morbidities - long discussion about weight loss, diet, and exercise  6. COPD exacerbation Get CXR, will switch to levaquin  Continue diet and meds as discussed. Further disposition pending results of labs. Over 30 minutes of exam, counseling, chart review, and critical decision making was performed Future Appointments Date Time Provider [place]  05/19/2015 11:15 AM Unk Pinto, MD [facility] None    HPI 81 y.o. male  presents for 3 month follow up on hypertension, cholesterol, prediabetes, and vitamin D deficiency.   His blood pressure has been controlled at home, today their BP is BP: (!) 126/58 mmHg  He does not workout. He denies chest pain, shortness of breath, dizziness.  He is not on cholesterol medication and denies myalgias. His cholesterol is at goal. The cholesterol last visit was:   Lab Results  Component Value Date   CHOL 168 10/15/2014   HDL 28* 10/15/2014   LDLCALC 73 10/15/2014   TRIG 333* 10/15/2014   CHOLHDL 6.0 10/15/2014    He has been working on diet and exercise for prediabetes, and denies paresthesia of the feet, polydipsia, polyuria and visual disturbances. Last A1C in the office was:  Lab Results  Component Value Date   HGBA1C 5.8* 10/15/2014   Patient is on Vitamin D supplement.   Lab Results  Component Value Date   VD25OH 61 10/15/2014     BMI is Body mass index is 33.85 kg/(m^2)., he is working on diet and exercise. Wt Readings from Last 3 Encounters:  02/23/15 278 lb (126.1 kg)   02/16/15 277 lb (125.646 kg)  10/15/14 278 lb 6.4 oz (126.281 kg)   He has COPD, treated for bronchitis last visit, has been pack since Saturday, has one more. He continues to have cough. Last CXR 05/2013.   Current Medications:  Current Outpatient Prescriptions on File Prior to Visit  Medication Sig Dispense Refill  . finasteride (PROSCAR) 5 MG tablet Take 5 mg by mouth daily.     Marland Kitchen losartan (COZAAR) 100 MG tablet TAKE 1 BY MOUTH EVERY MORNING 90 tablet 0  . Multiple Vitamin (MULTIVITAMIN) tablet Take 1 tablet by mouth daily.     . predniSONE (DELTASONE) 20 MG tablet 2 tablets daily for 3 days, 1 tablet daily for 4 days. 10 tablet 0  . promethazine-codeine (PHENERGAN WITH CODEINE) 6.25-10 MG/5ML syrup Take 5 mLs by mouth every 6 (six) hours as needed for cough. Max: 23mL per day 240 mL 0  . terazosin (HYTRIN) 10 MG capsule TAKE 1 BY MOUTH EVERY MORNING 90 capsule 0  . Umeclidinium-Vilanterol (ANORO ELLIPTA) 62.5-25 MCG/INH AEPB 2 puffs daily 60 each 2  . Vitamin D, Ergocalciferol, (DRISDOL) 50000 UNITS CAPS capsule TAKE ONE CAPSULE DAILY OR AS DIRECTED 90 capsule 3   No current facility-administered medications on file prior to visit.   Medical History:  Past Medical History  Diagnosis Date  . Rheumatoid arthritis(714.0)     DR. HAWKES  . BPH (benign prostatic hypertrophy)   . Scrotal lesion   . Short of breath on exertion   .  RBBB   . History of urinary retention   . Hypertension   . GERD (gastroesophageal reflux disease)   . DJD (degenerative joint disease)   . IBS (irritable bowel syndrome)   . Allergy   . COPD (chronic obstructive pulmonary disease) ([place])   . BPH (benign prostatic hypertrophy)   . Asthma    Allergies:  Allergies  Allergen Reactions  . Ace Inhibitors   . Asa [Aspirin]     High Dose asprin  . Penicillins Hives  . Vasotec [Enalapril]     Cough  . Clindamycin/Lincomycin     Hives     Review of Systems:  Review of Systems  Constitutional:  Negative.  Negative for fever, chills and diaphoresis.  HENT: Negative.  Negative for congestion, ear pain and sore throat.   Eyes: Negative.   Respiratory: Positive for cough and shortness of breath. Negative for wheezing.   Cardiovascular: Positive for chest pain (with coughing). Negative for palpitations and leg swelling.  Gastrointestinal: Negative.   Genitourinary: Negative.   Musculoskeletal: Negative.  Negative for falls and neck pain.  Neurological: Negative.  Negative for headaches.  Psychiatric/Behavioral: Negative.  Negative for depression.    Family history- Review and unchanged Social history- Review and unchanged Physical Exam: BP 126/58 mmHg  Pulse 73  Temp(Src) 97.7 F (36.5 C) (Temporal)  Resp 14  Ht 6\' 4"  (1.93 m)  Wt 278 lb (126.1 kg)  BMI 33.85 kg/m2  SpO2 97% Wt Readings from Last 3 Encounters:  02/23/15 278 lb (126.1 kg)  02/16/15 277 lb (125.646 kg)  10/15/14 278 lb 6.4 oz (126.281 kg)   General Appearance: Well nourished, in no apparent distress. Eyes: PERRLA, EOMs, conjunctiva no swelling or erythema Sinuses: No Frontal/maxillary tenderness ENT/Mouth: Ext aud canals clear, TMs without erythema, bulging. No erythema, swelling, or exudate on post pharynx.  Tonsils not swollen or erythematous. Hearing normal.  Neck: Supple, thyroid normal.  Respiratory: Respiratory effort normal, BS equal bilaterally with wheezing, worse at bases, without rales, rhonchi, or stridor.  Cardio: RRR with no MRGs. Brisk peripheral pulses without edema.  Abdomen: Soft, + BS,  Non tender, no guarding, rebound, hernias, masses. Lymphatics: Non tender without lymphadenopathy.  Musculoskeletal: Full ROM, 5/5 strength, Normal gait Skin: Warm, dry without rashes, lesions, ecchymosis.  Neuro: Cranial nerves intact. Normal muscle tone, no cerebellar symptoms. Psych: Awake and oriented X 3, normal affect, Insight and Judgment appropriate.    Vicie Mutters, PA-C 11:37  AM [facility]

## 2015-02-24 LAB — CBC WITH DIFFERENTIAL/PLATELET
Basophils Absolute: 0 10*3/uL (ref 0.0–0.1)
Basophils Relative: 0 % (ref 0–1)
Eosinophils Absolute: 0.1 10*3/uL (ref 0.0–0.7)
Eosinophils Relative: 1 % (ref 0–5)
HEMATOCRIT: 37.7 % — AB (ref 39.0–52.0)
HEMOGLOBIN: 13 g/dL (ref 13.0–17.0)
LYMPHS ABS: 1.6 10*3/uL (ref 0.7–4.0)
Lymphocytes Relative: 15 % (ref 12–46)
MCH: 31.1 pg (ref 26.0–34.0)
MCHC: 34.5 g/dL (ref 30.0–36.0)
MCV: 90.2 fL (ref 78.0–100.0)
MONOS PCT: 8 % (ref 3–12)
MPV: 9.1 fL (ref 8.6–12.4)
Monocytes Absolute: 0.8 10*3/uL (ref 0.1–1.0)
NEUTROS ABS: 8.1 10*3/uL — AB (ref 1.7–7.7)
NEUTROS PCT: 76 % (ref 43–77)
Platelets: 254 10*3/uL (ref 150–400)
RBC: 4.18 MIL/uL — ABNORMAL LOW (ref 4.22–5.81)
RDW: 13.8 % (ref 11.5–15.5)
WBC: 10.6 10*3/uL — ABNORMAL HIGH (ref 4.0–10.5)

## 2015-02-24 LAB — VITAMIN D 25 HYDROXY (VIT D DEFICIENCY, FRACTURES): VIT D 25 HYDROXY: 78 ng/mL (ref 30–100)

## 2015-02-24 LAB — HEMOGLOBIN A1C
HEMOGLOBIN A1C: 5.9 % — AB (ref <5.7)
MEAN PLASMA GLUCOSE: 123 mg/dL — AB (ref <117)

## 2015-05-19 ENCOUNTER — Other Ambulatory Visit: Payer: Self-pay | Admitting: *Deleted

## 2015-05-19 ENCOUNTER — Encounter: Payer: Self-pay | Admitting: Internal Medicine

## 2015-05-19 ENCOUNTER — Ambulatory Visit (INDEPENDENT_AMBULATORY_CARE_PROVIDER_SITE_OTHER): Payer: PPO | Admitting: Internal Medicine

## 2015-05-19 VITALS — BP 140/82 | HR 80 | Temp 97.3°F | Resp 16 | Ht 76.75 in | Wt 281.0 lb

## 2015-05-19 DIAGNOSIS — Z0000 Encounter for general adult medical examination without abnormal findings: Secondary | ICD-10-CM

## 2015-05-19 DIAGNOSIS — E559 Vitamin D deficiency, unspecified: Secondary | ICD-10-CM

## 2015-05-19 DIAGNOSIS — J449 Chronic obstructive pulmonary disease, unspecified: Secondary | ICD-10-CM

## 2015-05-19 DIAGNOSIS — R7303 Prediabetes: Secondary | ICD-10-CM | POA: Diagnosis not present

## 2015-05-19 DIAGNOSIS — Z0001 Encounter for general adult medical examination with abnormal findings: Secondary | ICD-10-CM | POA: Diagnosis not present

## 2015-05-19 DIAGNOSIS — M0579 Rheumatoid arthritis with rheumatoid factor of multiple sites without organ or systems involvement: Secondary | ICD-10-CM

## 2015-05-19 DIAGNOSIS — R6889 Other general symptoms and signs: Secondary | ICD-10-CM | POA: Diagnosis not present

## 2015-05-19 DIAGNOSIS — I10 Essential (primary) hypertension: Secondary | ICD-10-CM | POA: Diagnosis not present

## 2015-05-19 DIAGNOSIS — Z79899 Other long term (current) drug therapy: Secondary | ICD-10-CM

## 2015-05-19 DIAGNOSIS — E782 Mixed hyperlipidemia: Secondary | ICD-10-CM

## 2015-05-19 DIAGNOSIS — Z125 Encounter for screening for malignant neoplasm of prostate: Secondary | ICD-10-CM

## 2015-05-19 DIAGNOSIS — E669 Obesity, unspecified: Secondary | ICD-10-CM

## 2015-05-19 DIAGNOSIS — J4520 Mild intermittent asthma, uncomplicated: Secondary | ICD-10-CM

## 2015-05-19 DIAGNOSIS — Z1212 Encounter for screening for malignant neoplasm of rectum: Secondary | ICD-10-CM

## 2015-05-19 DIAGNOSIS — K219 Gastro-esophageal reflux disease without esophagitis: Secondary | ICD-10-CM

## 2015-05-19 DIAGNOSIS — N400 Enlarged prostate without lower urinary tract symptoms: Secondary | ICD-10-CM

## 2015-05-19 LAB — BASIC METABOLIC PANEL WITH GFR
BUN: 14 mg/dL (ref 7–25)
CHLORIDE: 104 mmol/L (ref 98–110)
CO2: 25 mmol/L (ref 20–31)
Calcium: 8.9 mg/dL (ref 8.6–10.3)
Creat: 1.29 mg/dL — ABNORMAL HIGH (ref 0.70–1.11)
GFR, EST NON AFRICAN AMERICAN: 51 mL/min — AB (ref >=60)
GFR, Est African American: 59 mL/min — ABNORMAL LOW (ref >=60)
Glucose, Bld: 96 mg/dL (ref 65–99)
Potassium: 4.1 mmol/L (ref 3.5–5.3)
SODIUM: 140 mmol/L (ref 135–146)

## 2015-05-19 LAB — CBC WITH DIFFERENTIAL/PLATELET
Basophils Absolute: 0 10*3/uL (ref 0.0–0.1)
Basophils Relative: 0 % (ref 0–1)
Eosinophils Absolute: 0.1 10*3/uL (ref 0.0–0.7)
Eosinophils Relative: 1 % (ref 0–5)
HEMATOCRIT: 38.8 % — AB (ref 39.0–52.0)
HEMOGLOBIN: 13.4 g/dL (ref 13.0–17.0)
LYMPHS PCT: 13 % (ref 12–46)
Lymphs Abs: 1.2 10*3/uL (ref 0.7–4.0)
MCH: 31.3 pg (ref 26.0–34.0)
MCHC: 34.5 g/dL (ref 30.0–36.0)
MCV: 90.7 fL (ref 78.0–100.0)
MONO ABS: 0.8 10*3/uL (ref 0.1–1.0)
MONOS PCT: 8 % (ref 3–12)
MPV: 9.2 fL (ref 8.6–12.4)
NEUTROS ABS: 7.5 10*3/uL (ref 1.7–7.7)
Neutrophils Relative %: 78 % — ABNORMAL HIGH (ref 43–77)
PLATELETS: 248 10*3/uL (ref 150–400)
RBC: 4.28 MIL/uL (ref 4.22–5.81)
RDW: 14 % (ref 11.5–15.5)
WBC: 9.6 10*3/uL (ref 4.0–10.5)

## 2015-05-19 LAB — LIPID PANEL
CHOL/HDL RATIO: 5.9 ratio — AB (ref <=5.0)
CHOLESTEROL: 148 mg/dL (ref 125–200)
HDL: 25 mg/dL — AB (ref >=40)
LDL Cholesterol: 64 mg/dL (ref <130)
Triglycerides: 293 mg/dL — ABNORMAL HIGH (ref <150)
VLDL: 59 mg/dL — AB (ref <30)

## 2015-05-19 LAB — HEPATIC FUNCTION PANEL
ALT: 23 U/L (ref 9–46)
AST: 21 U/L (ref 10–35)
Albumin: 3.6 g/dL (ref 3.6–5.1)
Alkaline Phosphatase: 83 U/L (ref 40–115)
BILIRUBIN DIRECT: 0.1 mg/dL (ref <=0.2)
BILIRUBIN INDIRECT: 0.5 mg/dL (ref 0.2–1.2)
BILIRUBIN TOTAL: 0.6 mg/dL (ref 0.2–1.2)
Total Protein: 6.2 g/dL (ref 6.1–8.1)

## 2015-05-19 LAB — TSH: TSH: 1.727 u[IU]/mL (ref 0.350–4.500)

## 2015-05-19 LAB — MAGNESIUM: MAGNESIUM: 1.9 mg/dL (ref 1.5–2.5)

## 2015-05-19 MED ORDER — TERAZOSIN HCL 10 MG PO CAPS: ORAL_CAPSULE | ORAL | Status: DC

## 2015-05-19 MED ORDER — VITAMIN D (ERGOCALCIFEROL) 1.25 MG (50000 UNIT) PO CAPS: ORAL_CAPSULE | ORAL | Status: DC

## 2015-05-19 MED ORDER — FINASTERIDE 5 MG PO TABS: 5.0000 mg | ORAL_TABLET | Freq: Every day | ORAL | Status: DC

## 2015-05-19 MED ORDER — LOSARTAN POTASSIUM 100 MG PO TABS: ORAL_TABLET | ORAL | Status: DC

## 2015-05-19 NOTE — Patient Instructions (Signed)

## 2015-05-19 NOTE — Progress Notes (Signed)
Patient ID: [name], male   DOB: 01-31-1931, 82 y.o.   MRN: [id number]  Annual  Screening/Preventative Visit And Comprehensive Evaluation & Examination  This very nice 82 y.o. MWM presents for presents for a Wellness/Preventative Visit & comprehensive evaluation and management of multiple medical co-morbidities.  Patient has been followed for HTN, Prediabetes, Hyperlipidemia and Vitamin D Deficiency.   HTN predates since 1999. Patient's BP has been controlled at home.Today's BP: 140/82 mmHg. Patient denies any cardiac symptoms as chest pain, palpitations, shortness of breath, dizziness or ankle swelling.   Patient's hyperlipidemia is controlled with diet and medications. Patient denies myalgias or other medication SE's. Last lipids were at goal with Cholesterol 145; HDL 31*; LDL 66; and elevated Triglycerides 241 on 02/23/2015.   Patient has Morbid Obesity  (BMI 33+) and consequent prediabetes since 2013 with A1c 5.7% and elevated insulin 42 and patient denies reactive hypoglycemic symptoms, visual blurring, diabetic polys or paresthesias. Last A1c was 5.9% on 02/23/2015.   Finally, patient has history of Vitamin D Deficiency of 25 in 2008 and last vitamin D was 78 on 02/23/2015.   Medication Sig  . finasteride  5 MG tablet Take 1 tablet (5 mg total) by mouth daily.   Prednisone 5 mg Takes 1 tablet daily per Dr Trudie Reed.  Marland Kitchen losartan  100 MG tablet TAKE 1 BY MOUTH EVERY MORNING  . Multiple Vitamin  Take 1 tablet by mouth daily.   . Finasteride 5 MG Take 5 mg by mouth daily.   Marland Kitchen losartan 100 MG  TAKE 1 BY MOUTH EVERY MORNING  . terazosin  10 MG  TAKE 1 BY MOUTH EVERY MORNING  . Vitamin D 50000 UNITS  TAKE ONE CAPSULE DAILY OR AS DIRECTED   Allergies  Allergen Reactions  . Ace Inhibitors   . Asa [Aspirin]     High Dose asprin  . Penicillins Hives  . Vasotec [Enalapril]     Cough  . Clindamycin/Lincomycin Hives   Past Medical History  Diagnosis Date  . Rheumatoid arthritis(714.0)      DR. HAWKES  . BPH (benign prostatic hypertrophy)   . Scrotal lesion   . Short of breath on exertion   . RBBB   . History of urinary retention   . Hypertension   . GERD (gastroesophageal reflux disease)   . DJD (degenerative joint disease)   . IBS (irritable bowel syndrome)   . Allergy   . COPD (chronic obstructive pulmonary disease) ([place])   . BPH (benign prostatic hypertrophy)   . Asthma    Health Maintenance  Topic Date Due  . INFLUENZA VACCINE  12/14/2015  . TETANUS/TDAP  04/15/2024  . ZOSTAVAX  Completed  . PNA vac Low Risk Adult  Completed   Immunization History  Administered Date(s) Administered  . DT 04/15/2014  . Influenza, High Dose Seasonal PF 04/15/2014, 02/16/2015  . Influenza-Unspecified 03/10/2013  . Pneumococcal Conjugate-13 04/15/2014  . Pneumococcal Polysaccharide-23 05/15/2001, 03/30/2011  . Td 05/15/2002  . Zoster 04/01/2012   Past Surgical History  Procedure Laterality Date  . Transurethral resection of prostate  SEVERAL YRS AGO  . Transthoracic echocardiogram  04-26-2011      MILD LVH/ LVSF NORMAL/ EF [id number] GRADE I DIASTOLIC DYSFUNCTION/ MILD MITRAL REGURG.  . Cardiovascular stress test  04-26-2011  DR BRACKBILL    LOW RISK STRESS NUCLEAR STUDY/ NO EVIDENCE ISCHEMIA  . Lumbar fusion  X3  LAST ONE 1996  . Scrotal exploration Left 07/17/2012    Procedure:  EXCISION  OF LEFT SCROTAL SKIN LESION;  Surgeon: Molli Hazard, MD;  Location: [facility];  Service: Urology;  Laterality: Left;   Family History  Problem Relation Age of Onset  . Heart disease Father   . Colon cancer Father   . Hypertension Father   . Diabetes Sister   . Heart disease Brother    Social History   Social History  . Marital Status: Married    Spouse Name: N/A  . Number of Children: N/A  . Years of Education: N/A   Occupational History  . RETIRED    Social History Main Topics  . Smoking status: Former Smoker    Quit date: 07/11/1980  .  Smokeless tobacco: Former Systems developer    Types: Chew  . Alcohol Use: No  . Drug Use: No  . Sexual Activity: Not on file   ROS Constitutional: Denies fever, chills, weight loss/gain, headaches, insomnia,  night sweats or change in appetite. Does c/o fatigue. Eyes: Denies redness, blurred vision, diplopia, discharge, itchy or watery eyes.  ENT: Denies discharge, congestion, post nasal drip, epistaxis, sore throat, earache, hearing loss, dental pain, Tinnitus, Vertigo, Sinus pain or snoring.  Cardio: Denies chest pain, palpitations, irregular heartbeat, syncope, dyspnea, diaphoresis, orthopnea, PND, claudication or edema Respiratory: denies cough, dyspnea, DOE, pleurisy, hoarseness, laryngitis or wheezing.  Gastrointestinal: Denies dysphagia, heartburn, reflux, water brash, pain, cramps, nausea, vomiting, bloating, diarrhea, constipation, hematemesis, melena, hematochezia, jaundice or hemorrhoids Genitourinary: Denies dysuria, frequency, urgency, nocturia, hesitancy, discharge, hematuria or flank pain Musculoskeletal: Denies arthralgia, myalgia, stiffness, Jt. Swelling, pain, limp or strain/sprain. Denies Falls. Skin: Denies puritis, rash, hives, warts, acne, eczema or change in skin lesion Neuro: No weakness, tremor, incoordination, spasms, paresthesia or pain Psychiatric: Denies confusion, memory loss or sensory loss. Denies Depression. Endocrine: Denies change in weight, skin, hair change, nocturia, and paresthesia, diabetic polys, visual blurring or hyper / hypo glycemic episodes.  Heme/Lymph: No excessive bleeding, bruising or enlarged lymph nodes.  Physical Exam  BP 140/82  Pulse 80  Temp 97.3 F   Resp 16  Ht 6' 4.75"   Wt 281 lb    BMI 33.55   General Appearance: Well nourished, in no apparent distress. Eyes: PERRLA, EOMs, conjunctiva no swelling or erythema, normal fundi and vessels. Sinuses: No frontal/maxillary tenderness ENT/Mouth: EACs patent / TMs  nl. Nares clear without  erythema, swelling, mucoid exudates. Oral hygiene is good. No erythema, swelling, or exudate. Tongue normal, non-obstructing. Tonsils not swollen or erythematous. Hearing normal.  Neck: Supple, thyroid normal. No bruits, nodes or JVD. Respiratory: Respiratory effort normal.  BS equal and clear bilateral without rales, rhonci, wheezing or stridor. Cardio: Heart sounds are normal with regular rate and rhythm and no murmurs, rubs or gallops. Peripheral pulses are normal and equal bilaterally without edema. No aortic or femoral bruits. Chest: symmetric with normal excursions and percussion.  Abdomen: Soft, with Nl bowel sounds. Nontender, no guarding, rebound, hernias, masses, or organomegaly.  Lymphatics: Non tender without lymphadenopathy.  Genitourinary: No hernias.Testes nl. DRE - prostate nl for age - smooth & firm w/o nodules. Musculoskeletal: Full ROM all peripheral extremities, joint stability, 5/5 strength, and normal gait. Skin: Warm and dry without rashes, lesions, cyanosis, clubbing or  ecchymosis.  Neuro: Cranial nerves intact, reflexes equal bilaterally. Normal muscle tone, no cerebellar symptoms. Sensation intact.  Pysch: Alert and oriented X 3 with normal affect, insight and judgment appropriate.   Assessment and Plan  1. Annual Preventative/Screening Exam   - Microalbumin / creatinine urine  ratio - EKG 12-Lead - [place], RETROPERITNL ABD,  LTD - POC Hemoccult Bld/Stl  - Urinalysis, Routine w reflex microscopic  - PSA - CBC with Differential/Platelet - BASIC METABOLIC PANEL WITH GFR - Hepatic function panel - Magnesium - Lipid panel - TSH - Hemoglobin A1c - Insulin, random - VITAMIN D 25 Hydroxy   2. Essential hypertension  - Microalbumin / creatinine urine ratio - EKG 12-Lead - [place], RETROPERITNL ABD,  LTD - TSH  3. Mixed hyperlipidemia  - Lipid panel - TSH  4. Prediabetes  - Hemoglobin A1c - Insulin, random  5. Vitamin D deficiency  - VITAMIN D 25 Hydroxy    6. Chronic obstructive pulmonary disease ([place])   7. Asthma, mild intermittent, uncomplicated   8. Obesity   9. Gastroesophageal reflux disease   10. Rheumatoid arthritis involving multiple sites with positive rheumatoid factor ([place])   11. BPH    12. Screening for rectal cancer  - POC Hemoccult Bld/Stl   13. Prostate cancer screening  - PSA  14. Medication management  - Urinalysis, Routine w reflex microscopic  - CBC with Differential/Platelet - BASIC METABOLIC PANEL WITH GFR - Hepatic function panel - Magnesium   Continue prudent diet as discussed, weight control, BP monitoring, regular exercise, and medications as discussed.  Discussed med effects and SE's. Routine screening labs and tests as requested with regular follow-up as recommended. Over 40 minutes of exam, counseling, chart review and high complex critical decision making was performed

## 2015-05-20 DIAGNOSIS — L853 Xerosis cutis: Secondary | ICD-10-CM | POA: Diagnosis not present

## 2015-05-20 DIAGNOSIS — L820 Inflamed seborrheic keratosis: Secondary | ICD-10-CM | POA: Diagnosis not present

## 2015-05-20 DIAGNOSIS — Z85828 Personal history of other malignant neoplasm of skin: Secondary | ICD-10-CM | POA: Diagnosis not present

## 2015-05-20 DIAGNOSIS — L814 Other melanin hyperpigmentation: Secondary | ICD-10-CM | POA: Diagnosis not present

## 2015-05-20 DIAGNOSIS — D225 Melanocytic nevi of trunk: Secondary | ICD-10-CM | POA: Diagnosis not present

## 2015-05-20 DIAGNOSIS — C44319 Basal cell carcinoma of skin of other parts of face: Secondary | ICD-10-CM | POA: Diagnosis not present

## 2015-05-20 DIAGNOSIS — L821 Other seborrheic keratosis: Secondary | ICD-10-CM | POA: Diagnosis not present

## 2015-05-20 DIAGNOSIS — L720 Epidermal cyst: Secondary | ICD-10-CM | POA: Diagnosis not present

## 2015-05-20 DIAGNOSIS — L570 Actinic keratosis: Secondary | ICD-10-CM | POA: Diagnosis not present

## 2015-05-20 DIAGNOSIS — D485 Neoplasm of uncertain behavior of skin: Secondary | ICD-10-CM | POA: Diagnosis not present

## 2015-05-20 LAB — URINALYSIS, ROUTINE W REFLEX MICROSCOPIC
BILIRUBIN URINE: NEGATIVE
Glucose, UA: NEGATIVE
Hgb urine dipstick: NEGATIVE
KETONES UR: NEGATIVE
Leukocytes, UA: NEGATIVE
Nitrite: POSITIVE — AB
PH: 7.5 (ref 5.0–8.0)
SPECIFIC GRAVITY, URINE: 1.02 (ref 1.001–1.035)

## 2015-05-20 LAB — VITAMIN D 25 HYDROXY (VIT D DEFICIENCY, FRACTURES): VIT D 25 HYDROXY: 106 ng/mL — AB (ref 30–100)

## 2015-05-20 LAB — URINALYSIS, MICROSCOPIC ONLY
CASTS: NONE SEEN [LPF]
Squamous Epithelial / LPF: NONE SEEN [HPF] (ref <=5)
YEAST: NONE SEEN [HPF]

## 2015-05-20 LAB — MICROALBUMIN / CREATININE URINE RATIO
Creatinine, Urine: 273 mg/dL (ref 20–370)
Microalb Creat Ratio: 38 mcg/mg creat — ABNORMAL HIGH (ref <30)
Microalb, Ur: 10.5 mg/dL

## 2015-05-20 LAB — PSA: PSA: 0.13 ng/mL (ref <=4.00)

## 2015-05-20 LAB — HEMOGLOBIN A1C
HEMOGLOBIN A1C: 5.8 % — AB (ref <5.7)
Mean Plasma Glucose: 120 mg/dL — ABNORMAL HIGH (ref <117)

## 2015-05-20 LAB — INSULIN, RANDOM: Insulin: 27.8 u[IU]/mL — ABNORMAL HIGH (ref 2.0–19.6)

## 2015-06-16 DIAGNOSIS — C44319 Basal cell carcinoma of skin of other parts of face: Secondary | ICD-10-CM | POA: Diagnosis not present

## 2015-06-16 DIAGNOSIS — Z85828 Personal history of other malignant neoplasm of skin: Secondary | ICD-10-CM | POA: Diagnosis not present

## 2015-06-28 ENCOUNTER — Other Ambulatory Visit: Payer: Self-pay | Admitting: *Deleted

## 2015-06-28 DIAGNOSIS — Z1212 Encounter for screening for malignant neoplasm of rectum: Secondary | ICD-10-CM

## 2015-06-28 DIAGNOSIS — Z0001 Encounter for general adult medical examination with abnormal findings: Secondary | ICD-10-CM

## 2015-06-28 LAB — POC HEMOCCULT BLD/STL (HOME/3-CARD/SCREEN)
Card #2 Fecal Occult Blod, POC: NEGATIVE
FECAL OCCULT BLD: NEGATIVE
Fecal Occult Blood, POC: NEGATIVE

## 2015-08-10 DIAGNOSIS — M8580 Other specified disorders of bone density and structure, unspecified site: Secondary | ICD-10-CM | POA: Diagnosis not present

## 2015-08-10 DIAGNOSIS — Z7952 Long term (current) use of systemic steroids: Secondary | ICD-10-CM | POA: Diagnosis not present

## 2015-08-10 DIAGNOSIS — M0579 Rheumatoid arthritis with rheumatoid factor of multiple sites without organ or systems involvement: Secondary | ICD-10-CM | POA: Diagnosis not present

## 2015-08-11 ENCOUNTER — Other Ambulatory Visit: Payer: Self-pay | Admitting: Rheumatology

## 2015-08-11 DIAGNOSIS — R5381 Other malaise: Secondary | ICD-10-CM

## 2015-08-12 ENCOUNTER — Other Ambulatory Visit: Payer: Self-pay | Admitting: Rheumatology

## 2015-08-12 DIAGNOSIS — M8580 Other specified disorders of bone density and structure, unspecified site: Secondary | ICD-10-CM

## 2015-08-18 DIAGNOSIS — L821 Other seborrheic keratosis: Secondary | ICD-10-CM | POA: Diagnosis not present

## 2015-08-18 DIAGNOSIS — L570 Actinic keratosis: Secondary | ICD-10-CM | POA: Diagnosis not present

## 2015-08-18 DIAGNOSIS — Z85828 Personal history of other malignant neoplasm of skin: Secondary | ICD-10-CM | POA: Diagnosis not present

## 2015-08-18 DIAGNOSIS — L814 Other melanin hyperpigmentation: Secondary | ICD-10-CM | POA: Diagnosis not present

## 2015-08-18 DIAGNOSIS — L820 Inflamed seborrheic keratosis: Secondary | ICD-10-CM | POA: Diagnosis not present

## 2015-08-18 DIAGNOSIS — D1801 Hemangioma of skin and subcutaneous tissue: Secondary | ICD-10-CM | POA: Diagnosis not present

## 2015-08-20 ENCOUNTER — Ambulatory Visit: Payer: Self-pay | Admitting: Internal Medicine

## 2015-09-20 ENCOUNTER — Ambulatory Visit
Admission: RE | Admit: 2015-09-20 | Discharge: 2015-09-20 | Disposition: A | Discharge status: Home / Self Care | Payer: PPO | Source: Ambulatory Visit | Attending: Rheumatology | Admitting: Rheumatology

## 2015-09-20 DIAGNOSIS — M8580 Other specified disorders of bone density and structure, unspecified site: Secondary | ICD-10-CM

## 2015-09-20 DIAGNOSIS — M85851 Other specified disorders of bone density and structure, right thigh: Secondary | ICD-10-CM | POA: Diagnosis not present

## 2015-11-30 ENCOUNTER — Ambulatory Visit (INDEPENDENT_AMBULATORY_CARE_PROVIDER_SITE_OTHER): Payer: PPO | Admitting: Internal Medicine

## 2015-11-30 ENCOUNTER — Encounter: Payer: Self-pay | Admitting: Internal Medicine

## 2015-11-30 VITALS — BP 122/60 | HR 80 | Temp 97.3°F | Resp 16 | Ht 76.75 in | Wt 280.2 lb

## 2015-11-30 DIAGNOSIS — M0579 Rheumatoid arthritis with rheumatoid factor of multiple sites without organ or systems involvement: Secondary | ICD-10-CM

## 2015-11-30 DIAGNOSIS — I10 Essential (primary) hypertension: Secondary | ICD-10-CM

## 2015-11-30 DIAGNOSIS — E559 Vitamin D deficiency, unspecified: Secondary | ICD-10-CM | POA: Diagnosis not present

## 2015-11-30 DIAGNOSIS — E782 Mixed hyperlipidemia: Secondary | ICD-10-CM

## 2015-11-30 DIAGNOSIS — Z79899 Other long term (current) drug therapy: Secondary | ICD-10-CM | POA: Diagnosis not present

## 2015-11-30 DIAGNOSIS — R7303 Prediabetes: Secondary | ICD-10-CM | POA: Diagnosis not present

## 2015-11-30 DIAGNOSIS — J449 Chronic obstructive pulmonary disease, unspecified: Secondary | ICD-10-CM | POA: Diagnosis not present

## 2015-11-30 LAB — LIPID PANEL
CHOL/HDL RATIO: 5 ratio (ref <=5.0)
Cholesterol: 150 mg/dL (ref 125–200)
HDL: 30 mg/dL — AB (ref >=40)
LDL CALC: 72 mg/dL (ref <130)
TRIGLYCERIDES: 241 mg/dL — AB (ref <150)
VLDL: 48 mg/dL — AB (ref <30)

## 2015-11-30 LAB — HEPATIC FUNCTION PANEL
ALBUMIN: 3.6 g/dL (ref 3.6–5.1)
ALK PHOS: 75 U/L (ref 40–115)
ALT: 21 U/L (ref 9–46)
AST: 20 U/L (ref 10–35)
BILIRUBIN DIRECT: 0.1 mg/dL (ref <=0.2)
BILIRUBIN INDIRECT: 0.5 mg/dL (ref 0.2–1.2)
BILIRUBIN TOTAL: 0.6 mg/dL (ref 0.2–1.2)
Total Protein: 6.3 g/dL (ref 6.1–8.1)

## 2015-11-30 LAB — BASIC METABOLIC PANEL WITH GFR
BUN: 15 mg/dL (ref 7–25)
CO2: 27 mmol/L (ref 20–31)
Calcium: 8.6 mg/dL (ref 8.6–10.3)
Chloride: 105 mmol/L (ref 98–110)
Creat: 1.33 mg/dL — ABNORMAL HIGH (ref 0.70–1.11)
GFR, EST AFRICAN AMERICAN: 57 mL/min — AB (ref >=60)
GFR, EST NON AFRICAN AMERICAN: 49 mL/min — AB (ref >=60)
GLUCOSE: 106 mg/dL — AB (ref 65–99)
POTASSIUM: 3.9 mmol/L (ref 3.5–5.3)
SODIUM: 140 mmol/L (ref 135–146)

## 2015-11-30 LAB — CBC WITH DIFFERENTIAL/PLATELET
Basophils Absolute: 0 {cells}/uL (ref 0–200)
Basophils Relative: 0 %
Eosinophils Absolute: 82 {cells}/uL (ref 15–500)
Eosinophils Relative: 1 %
HCT: 39.8 % (ref 38.5–50.0)
Hemoglobin: 13.2 g/dL (ref 13.2–17.1)
Lymphocytes Relative: 19 %
Lymphs Abs: 1558 {cells}/uL (ref 850–3900)
MCH: 30.5 pg (ref 27.0–33.0)
MCHC: 33.2 g/dL (ref 32.0–36.0)
MCV: 91.9 fL (ref 80.0–100.0)
MPV: 9.3 fL (ref 7.5–12.5)
Monocytes Absolute: 738 {cells}/uL (ref 200–950)
Monocytes Relative: 9 %
Neutro Abs: 5822 {cells}/uL (ref 1500–7800)
Neutrophils Relative %: 71 %
Platelets: 225 10*3/uL (ref 140–400)
RBC: 4.33 MIL/uL (ref 4.20–5.80)
RDW: 13.8 % (ref 11.0–15.0)
WBC: 8.2 10*3/uL (ref 3.8–10.8)

## 2015-11-30 LAB — MAGNESIUM: Magnesium: 1.8 mg/dL (ref 1.5–2.5)

## 2015-11-30 LAB — TSH: TSH: 2 m[IU]/L (ref 0.40–4.50)

## 2015-11-30 NOTE — Patient Instructions (Signed)

## 2015-11-30 NOTE — Progress Notes (Signed)
Patient ID: [name], male   DOB: 1930/06/11, 82 y.o.   MRN: [id number]  [facility]                       Unk Pinto, M.D.        Uvaldo Bristle. Silverio Lay, P.A.-C       Starlyn Skeans, P.A.-C   [facility]                [address] [place], [place] [id number] Telephone [phone] Telefax [phone] ______________________________________________________________________     This very nice 82 y.o. MWM presents for 3 month follow up with Hypertension, Hyperlipidemia, Pre-Diabetes and Vitamin D Deficiency. Patient also has Rheumatoid Arthritis in 2010 and followed by Dr Berna Bue with good control of his arthralgias of he shoulders, knees & legs on low dose prednisone.     Patient is treated for HTN circa 1999 & BP has been controlled at home. Today's BP: 122/60 mmHg. Patient has had no complaints of any cardiac type chest pain, palpitations, dyspnea/orthopnea/PND, dizziness, claudication, or dependent edema.     Hyperlipidemia is controlled with diet & meds. Patient denies myalgias or other med SE's. Last Lipids were at goal with Cholesterol 148; HDL 25*; LDL 64; & elevated Triglycerides 293 on 05/19/2015.     Also, the patient has history of PreDiabetes since 02/2012 with A1c 5.7% and has had no symptoms of reactive hypoglycemia, diabetic polys, paresthesias or visual blurring.  Last A1c was 5.8% on 05/19/2015.      Further, the patient also has history of Vitamin D Deficiency of "25" in 2008 and supplements vitamin D without any suspected side-effects. Last vitamin D was 106 on 05/19/2015.  Medication Sig  . finasteride 5 MG tablet Take 1 tablet (5 mg total) by mouth daily.  Marland Kitchen losartan  100 MG tablet TAKE 1 BY MOUTH EVERY MORNING  . Multiple Vitamin  Take 1 tablet by mouth daily.   . predniSONE 5 MG tablet Take 5 mg by mouth daily with breakfast.  . terazosin  10 MG capsule TAKE 1 BY MOUTH EVERY  MORNING  . Vitamin D 50,000 units  TAKE ONE CAPSULE DAILY OR AS DIRECTED - takes 4 x/ week    Allergies  Allergen Reactions  . Ace Inhibitors   . Asa [Aspirin]     High Dose asprin  . Penicillins Hives  . Vasotec [Enalapril]     Cough  . Clindamycin/Lincomycin     Hives    PMHx:   Past Medical History  Diagnosis Date  . Rheumatoid arthritis(714.0)     DR. HAWKES  . BPH (benign prostatic hypertrophy)   . Scrotal lesion   . Short of breath on exertion   . RBBB   . History of urinary retention   . Hypertension   . GERD (gastroesophageal reflux disease)   . DJD (degenerative joint disease)   . IBS (irritable bowel syndrome)   . Allergy   . COPD (chronic obstructive pulmonary disease) ([place])   . BPH (benign prostatic hypertrophy)   . Asthma    Immunization History  Administered Date(s) Administered  . DT 04/15/2014  . Influenza, High Dose Seasonal PF 04/15/2014, 02/16/2015  . Influenza-Unspecified 03/10/2013  . Pneumococcal Conjugate-13 04/15/2014  . Pneumococcal Polysaccharide-23 05/15/2001, 03/30/2011  . Td 05/15/2002  . Zoster 04/01/2012   Past  Surgical History  Procedure Laterality Date  . Transurethral resection of prostate  SEVERAL YRS AGO  . Transthoracic echocardiogram  04-26-2011      MILD LVH/ LVSF NORMAL/ EF [id number] GRADE I DIASTOLIC DYSFUNCTION/ MILD MITRAL REGURG.  . Cardiovascular stress test  04-26-2011  DR BRACKBILL    LOW RISK STRESS NUCLEAR STUDY/ NO EVIDENCE ISCHEMIA  . Lumbar fusion  X3  LAST ONE 1996  . Scrotal exploration Left 07/17/2012    Procedure:  EXCISION  OF LEFT SCROTAL SKIN LESION;  Surgeon: Molli Hazard, MD;  Location: [facility];  Service: Urology;  Laterality: Left;   FHx:    Reviewed / unchanged  SHx:    Reviewed / unchanged  Systems Review:  Constitutional: Denies fever, chills, wt changes, headaches, insomnia, fatigue, night sweats, change in appetite. Eyes: Denies redness, blurred vision, diplopia,  discharge, itchy, watery eyes.  ENT: Denies discharge, congestion, post nasal drip, epistaxis, sore throat, earache, hearing loss, dental pain, tinnitus, vertigo, sinus pain, snoring.  CV: Denies chest pain, palpitations, irregular heartbeat, syncope, dyspnea, diaphoresis, orthopnea, PND, claudication or edema. Respiratory: denies cough, dyspnea, DOE, pleurisy, hoarseness, laryngitis, wheezing.  Gastrointestinal: Denies dysphagia, odynophagia, heartburn, reflux, water brash, abdominal pain or cramps, nausea, vomiting, bloating, diarrhea, constipation, hematemesis, melena, hematochezia  or hemorrhoids. Genitourinary: Denies dysuria, frequency, urgency, nocturia, hesitancy, discharge, hematuria or flank pain. Musculoskeletal: Denies arthralgias, myalgias, stiffness, jt. swelling, pain, limping or strain/sprain.  Skin: Denies pruritus, rash, hives, warts, acne, eczema or change in skin lesion(s). Neuro: No weakness, tremor, incoordination, spasms, paresthesia or pain. Psychiatric: Denies confusion, memory loss or sensory loss. Endo: Denies change in weight, skin or hair change.  Heme/Lymph: No excessive bleeding, bruising or enlarged lymph nodes.  Physical Exam  BP 122/60  Pulse 80  Temp 97.3 F   Resp 16  Ht 6' 4.75"   Wt 280 lb     BMI 33.46   Appears well nourished and in no distress. Eyes: PERRLA, EOMs, conjunctiva no swelling or erythema. Sinuses: No frontal/maxillary tenderness ENT/Mouth: EAC's clear, TM's nl w/o erythema, bulging. Nares clear w/o erythema, swelling, exudates. Oropharynx clear without erythema or exudates. Oral hygiene is good. Tongue normal, non obstructing. Hearing intact.  Neck: Supple. Thyroid nl. Car 2+/2+ without bruits, nodes or JVD. Chest: Respirations nl with BS clear & equal w/o rales, rhonchi, wheezing or stridor.  Cor: Heart sounds normal w/ regular rate and rhythm without sig. murmurs, gallops, clicks, or rubs. Peripheral pulses normal and equal  without  edema.  Abdomen: Soft & bowel sounds normal. Non-tender w/o guarding, rebound, hernias, masses, or organomegaly.  Lymphatics: Unremarkable.  Musculoskeletal: Full ROM all peripheral extremities, joint stability, 5/5 strength, and normal gait.  Skin: Warm, dry without exposed rashes, lesions or ecchymosis apparent.  Neuro: Cranial nerves intact, reflexes equal bilaterally. Sensory-motor testing grossly intact. Tendon reflexes grossly intact.  Pysch: Alert & oriented x 3.  Insight and judgement nl & appropriate. No ideations.  Assessment and Plan:   1. Essential hypertension  - Suggested change Terazosin dosing to HS due to some daytime  postural dizziness. - Continue medication, monitor blood pressure at home. Continue DASH diet. Reminder to go to the ER if any CP, SOB, nausea, dizziness, severe HA, changes vision/speech, left arm numbness and tingling and jaw pain. - TSH  2. Mixed hyperlipidemia  - Continue diet/meds, exercise,& lifestyle modifications. Continue monitor periodic cholesterol/liver & renal functions  - Lipid panel - TSH  3. Prediabetes  - Continue diet, exercise, lifestyle  modifications. Monitor appropriate labs. - Hemoglobin A1c - Insulin, random  4. Vitamin D deficiency  - Continue supplementation. - VITAMIN D 25 Hydroxy  5. Chronic obstructive pulmonary disease ([place])   6. Rheumatoid arthritis  ([place])   7. Medication management  - CBC with Differential/Platelet - BASIC METABOLIC PANEL WITH GFR - Hepatic function panel - Magnesium   Recommended regular exercise, BP monitoring, weight control, and discussed med and SE's. Recommended labs to assess and monitor clinical status. Further disposition pending results of labs. Over 30 minutes of exam, counseling, chart review was performed

## 2015-12-01 LAB — INSULIN, RANDOM: INSULIN: 42.1 u[IU]/mL — AB (ref 2.0–19.6)

## 2015-12-01 LAB — HEMOGLOBIN A1C
Hgb A1c MFr Bld: 5.6 % (ref <5.7)
Mean Plasma Glucose: 114 mg/dL

## 2015-12-01 LAB — VITAMIN D 25 HYDROXY (VIT D DEFICIENCY, FRACTURES): Vit D, 25-Hydroxy: 84 ng/mL (ref 30–100)

## 2015-12-21 ENCOUNTER — Encounter: Payer: Self-pay | Admitting: Internal Medicine

## 2015-12-21 ENCOUNTER — Ambulatory Visit (HOSPITAL_COMMUNITY)
Admission: RE | Admit: 2015-12-21 | Discharge: 2015-12-21 | Disposition: A | Discharge status: Home / Self Care | Payer: PPO | Source: Ambulatory Visit | Attending: Internal Medicine | Admitting: Internal Medicine

## 2015-12-21 ENCOUNTER — Ambulatory Visit (INDEPENDENT_AMBULATORY_CARE_PROVIDER_SITE_OTHER): Payer: PPO | Admitting: Internal Medicine

## 2015-12-21 VITALS — BP 126/60 | HR 84 | Temp 98.0°F | Resp 16 | Ht 76.5 in | Wt 276.0 lb

## 2015-12-21 DIAGNOSIS — J449 Chronic obstructive pulmonary disease, unspecified: Secondary | ICD-10-CM | POA: Insufficient documentation

## 2015-12-21 DIAGNOSIS — R0602 Shortness of breath: Secondary | ICD-10-CM | POA: Diagnosis not present

## 2015-12-21 LAB — BASIC METABOLIC PANEL
BUN: 18 mg/dL (ref 7–25)
CALCIUM: 9 mg/dL (ref 8.6–10.3)
CO2: 25 mmol/L (ref 20–31)
Chloride: 103 mmol/L (ref 98–110)
Creat: 1.63 mg/dL — ABNORMAL HIGH (ref 0.70–1.11)
Glucose, Bld: 110 mg/dL — ABNORMAL HIGH (ref 65–99)
POTASSIUM: 4.2 mmol/L (ref 3.5–5.3)
SODIUM: 140 mmol/L (ref 135–146)

## 2015-12-21 NOTE — Patient Instructions (Signed)
Please take zyrtec 1 tablet per day at bedtime.  Please use bevespi inhaler 2 puffs morning and evening.  Please use albuterol inhaler every 6-8 hours as needed for shortness of breath.    Please drink plenty of water.   Please take mucinex in the mornings and the evenings to thin out chest congestion.   [facility]

## 2015-12-21 NOTE — Progress Notes (Signed)
HPI  Patient presents to the office for evaluation of cough.  It has been going on for 5 days.  Patient reports all the time, dry, barky, with nighttime wheezing.  They also endorse shortness of breath, wheezing and rhinorrhea.  No sore throat, ear congestion, sinus congestion, no headaches.  .  They have tried dayquil and nyquil.  They report that nothing has worked.  They denies other sick contacts.  He feels like he can't get the air out of his lungs.  He can't walk to his car without getting short of breath.  He reports that this has been going on for the last year.  He was a former smoker.  He quit 25 years ago.  He smoked for 25 years, He was smoking a pack and a half of cigarrettes daily.  He is still taking prednisone 5 mg tablets.  He has tried inhalers in the past without much relief.    Review of Systems  Constitutional: Positive for malaise/fatigue. Negative for chills and fever.  HENT: Positive for congestion. Negative for ear pain and sore throat.   Respiratory: Positive for cough, shortness of breath and wheezing. Negative for sputum production.   Cardiovascular: Negative for chest pain, orthopnea and PND.  Neurological: Negative for headaches.    PE:  General:  Alert and non-toxic, WDWN, NAD HEENT: NCAT, PERLA, EOM normal, no occular discharge or erythema.  Nasal mucosal edema with sinus tenderness to palpation.  Oropharynx clear with minimal oropharyngeal edema and erythema.  Mucous membranes moist and pink. Neck:  Cervical adenopathy Chest:  RRR no MRGs.  Lungs clear to auscultation A&P with no wheezes rhonchi or rales.   Abdomen: +BS x 4 quadrants, soft, non-tender, no guarding, rigidity, or rebound. Skin: warm and dry no rash Neuro: A&Ox4, CN II-XII grossly intact  Assessment and Plan:   1. Chronic obstructive pulmonary disease, unspecified COPD type (HCC) -zpak -prednisone taper and then return to 5 mg  -albuterol prn -bevespi sample given - DG Chest 2 View;  Future  2. Shortness of breath  - Brain natriuretic peptide - Basic metabolic panel

## 2015-12-22 ENCOUNTER — Other Ambulatory Visit: Payer: Self-pay | Admitting: Internal Medicine

## 2015-12-22 LAB — BRAIN NATRIURETIC PEPTIDE: Brain Natriuretic Peptide: 35 pg/mL (ref <100)

## 2015-12-22 MED ORDER — ALBUTEROL SULFATE HFA 108 (90 BASE) MCG/ACT IN AERS: 2.0000 | INHALATION_SPRAY | Freq: Four times a day (QID) | RESPIRATORY_TRACT | 0 refills | Status: DC | PRN

## 2015-12-22 MED ORDER — PREDNISONE 20 MG PO TABS: ORAL_TABLET | ORAL | 0 refills | Status: DC

## 2015-12-22 MED ORDER — AZITHROMYCIN 250 MG PO TABS: ORAL_TABLET | ORAL | 0 refills | Status: DC

## 2016-01-11 ENCOUNTER — Other Ambulatory Visit: Payer: Self-pay | Admitting: Internal Medicine

## 2016-01-14 DIAGNOSIS — C44519 Basal cell carcinoma of skin of other part of trunk: Secondary | ICD-10-CM | POA: Diagnosis not present

## 2016-01-14 DIAGNOSIS — C44311 Basal cell carcinoma of skin of nose: Secondary | ICD-10-CM | POA: Diagnosis not present

## 2016-01-14 DIAGNOSIS — D225 Melanocytic nevi of trunk: Secondary | ICD-10-CM | POA: Diagnosis not present

## 2016-01-14 DIAGNOSIS — Z85828 Personal history of other malignant neoplasm of skin: Secondary | ICD-10-CM | POA: Diagnosis not present

## 2016-01-14 DIAGNOSIS — L570 Actinic keratosis: Secondary | ICD-10-CM | POA: Diagnosis not present

## 2016-01-14 DIAGNOSIS — L821 Other seborrheic keratosis: Secondary | ICD-10-CM | POA: Diagnosis not present

## 2016-01-25 ENCOUNTER — Other Ambulatory Visit: Payer: Self-pay | Admitting: Internal Medicine

## 2016-02-09 DIAGNOSIS — Z85828 Personal history of other malignant neoplasm of skin: Secondary | ICD-10-CM | POA: Diagnosis not present

## 2016-02-09 DIAGNOSIS — C44311 Basal cell carcinoma of skin of nose: Secondary | ICD-10-CM | POA: Diagnosis not present

## 2016-02-18 ENCOUNTER — Ambulatory Visit (INDEPENDENT_AMBULATORY_CARE_PROVIDER_SITE_OTHER): Payer: PPO | Admitting: Internal Medicine

## 2016-02-18 ENCOUNTER — Encounter: Payer: Self-pay | Admitting: Internal Medicine

## 2016-02-18 VITALS — BP 144/72 | HR 76 | Temp 97.7°F | Resp 16 | Ht 76.75 in | Wt 280.0 lb

## 2016-02-18 DIAGNOSIS — Z23 Encounter for immunization: Secondary | ICD-10-CM

## 2016-02-18 DIAGNOSIS — K1120 Sialoadenitis, unspecified: Secondary | ICD-10-CM

## 2016-02-18 MED ORDER — DOXYCYCLINE HYCLATE 100 MG PO CAPS: ORAL_CAPSULE | ORAL | 0 refills | Status: AC

## 2016-02-18 NOTE — Progress Notes (Signed)
  Subjective:    Patient ID: [name], male    DOB: 04-Jan-1931, 82 y.o.   MRN: [id number]  HPI  Patient presents with sub-acute painless swelling of his Left cheek over the last 2 days. Denies injury. No fever, chills or rash.   Medication Sig  . albuterol  HFA inhaler Inhale 2 puffs every 6  hrs as needed   . finasteride  5 MG tablet Take 1 tablet (5 mg total) by mouth daily.  Marland Kitchen losartan  100 MG tablet TAKE 1 BY MOUTH EVERY MORNING  . Multiple Vitamin tablet Take 1 tablet by mouth daily.   . predniSONE  5 MG tablet Take 5 mg by mouth daily with breakfast.  . terazosin (HYTRIN) 10 MG capsule TAKE 1 BY MOUTH EVERY MORNING  . Vitamin D 50,000 units  TAKE ONE CAPSULE DAILY OR AS DIRECTED   Allergen  . Ace Inhibitors  . Asa [Aspirin]  . Penicillins  . Vasotec   . Clindamycin/Lincomycin   Past Medical History:  Diagnosis Date  . Allergy   . Asthma   . BPH (benign prostatic hypertrophy)   . BPH (benign prostatic hypertrophy)   . COPD (chronic obstructive pulmonary disease) ([place])   . DJD (degenerative joint disease)   . GERD (gastroesophageal reflux disease)   . History of urinary retention   . Hypertension   . IBS (irritable bowel syndrome)   . RBBB   . Rheumatoid arthritis(714.0)    DR. HAWKES  . Scrotal lesion   . Short of breath on exertion    Review of Systems  10 point systems review negative except as above.    Objective:   Physical Exam  BP (!) 144/72   Pulse 76   Temp 97.7 F (36.5 C)   Resp 16   Ht 6' 4.75" (1.949 m)   Wt 280 lb (127 kg)   BMI 33.42 kg/m   HEENT - Eac's patent. TM's Nl. EOM's full. PERRLA. NasoOroPharynx clear. Non-tender swelling of the Left Parotid.  Neck - supple. Nl Thyroid. Carotids 2+ & No bruits, nodes, JVD Chest - Clear equal BS w/o Rales, rhonchi, wheezes. Cor - Nl HS. RRR w/o sig MGR. PP 1(+). No edema. Abd - No palpable organomegaly, masses or tenderness. BS nl. MS- FROM w/o deformities. Muscle power, tone and bulk Nl. Gait  Nl. Neuro - No obvious Cr N abnormalities. Sensory, motor and Cerebellar functions appear Nl w/o focal abnormalities.    Assessment & Plan:   1. Parotiditis - ? Retained duct stone  - Doxycycline #30 - 2 stat, then 1 bid - Discussed meds & SE's. - ROV 7 days to recheck - if no better may order U/S  2. Need for prophylactic vaccination and inoculation against influenza  - Flu vaccine HIGH DOSE PF (Fluzone High dose)

## 2016-02-24 ENCOUNTER — Encounter: Payer: Self-pay | Admitting: Internal Medicine

## 2016-02-24 ENCOUNTER — Ambulatory Visit (INDEPENDENT_AMBULATORY_CARE_PROVIDER_SITE_OTHER): Payer: PPO | Admitting: Internal Medicine

## 2016-02-24 VITALS — BP 122/68 | HR 72 | Temp 97.3°F | Resp 16 | Ht 76.75 in | Wt 281.8 lb

## 2016-02-24 DIAGNOSIS — K1120 Sialoadenitis, unspecified: Secondary | ICD-10-CM

## 2016-02-24 MED ORDER — PREDNISONE 20 MG PO TABS: ORAL_TABLET | ORAL | 0 refills | Status: DC

## 2016-02-24 NOTE — Progress Notes (Signed)
  Subjective:    Patient ID: [name], male    DOB: [birth year], 82 y.o.   MRN: [id number]  HPI   Patient presents for recheck. On 02/18/2016. Patient presented with a 2 day hx/o non-tender STS about the Rt parotid and was empirically started on Doxycycline bid. He reports the mass is smaller.  Medication Sig  . albuterol HFA inhaler Inhale 2 puffs every 6  hrs as needed  . doxycycline  100 MG capsule Take 2 caps now then 1 capsule 2 x/day on a full stomach for infection  . finasteride (PROSCAR) 5 MG tablet Take 1 tab daily.  Marland Kitchen losartan (COZAAR) 100 MG tablet TAKE 1  EVERY MORNING  . Multiple Vitamin (MULTIVITAMIN) tablet Take 1 tab daily.   . predniSONE  5 MG tablet Take  daily with breakfast.  . terazosin  10 MG capsule TAKE 1 BY MOUTH EVERY MORNING  . Vitamin D 50,000 units CAPS TAKE ONE CAPSULE DAILY OR AS DIRECTED   Allergies  Allergen Reactions  . Ace Inhibitors   . Asa [Aspirin]     High Dose asprin  . Penicillins Hives  . Vasotec [Enalapril]     Cough  . Clindamycin/Lincomycin     Hives    Past Medical History:  Diagnosis Date  . Allergy   . Asthma   . BPH (benign prostatic hypertrophy)   . BPH (benign prostatic hypertrophy)   . COPD (chronic obstructive pulmonary disease) ([place])   . DJD (degenerative joint disease)   . GERD (gastroesophageal reflux disease)   . History of urinary retention   . Hypertension   . IBS (irritable bowel syndrome)   . RBBB   . Rheumatoid arthritis(714.0)    DR. HAWKES  . Scrotal lesion   . Short of breath on exertion    Review of Systems  10 point systems review negative except as above.     Objective:   Physical Exam  BP 122/68   Pulse 72   Temp 97.3 F (36.3 C)   Resp 16   Ht 6' 4.75" (1.949 m)   Wt 281 lb 12.8 oz (127.8 kg)   BMI 33.63 kg/m   Swelling of the Left cheek, pre-auricular appears less than previous.  HEENT - EAC's & TM's - Nl. N/O/P - Clear.  Non tender mobile encapsulated area about the L Parotid  seeming about 40% smaller than previous visit 1 week ago. No palpable LN's. NECK- Supple . Thyroid not palpable and no palpable Cx LN's. Normal      Assessment & Plan:   1. Parotiditis  - continue Doxycycline  - predniSONE (DELTASONE) 20 MG tablet; 1 tab 3 x day for 3 days, then 1 tab 2 x day for 3 days, then 1 tab 1 x day for 5 days  Dispense: 20 tablet; Refill: 0

## 2016-02-29 DIAGNOSIS — M0579 Rheumatoid arthritis with rheumatoid factor of multiple sites without organ or systems involvement: Secondary | ICD-10-CM | POA: Diagnosis not present

## 2016-02-29 DIAGNOSIS — M8580 Other specified disorders of bone density and structure, unspecified site: Secondary | ICD-10-CM | POA: Diagnosis not present

## 2016-02-29 DIAGNOSIS — Z7952 Long term (current) use of systemic steroids: Secondary | ICD-10-CM | POA: Diagnosis not present

## 2016-03-02 ENCOUNTER — Ambulatory Visit: Payer: Self-pay | Admitting: Physician Assistant

## 2016-03-02 NOTE — Progress Notes (Deleted)
MEDICARE ANNUAL WELLNESS VISIT AND FOLLOW UP  Assessment:    Over 30 minutes of exam, counseling, chart review, and critical decision making was performed  Future Appointments Date Time Provider [place]  03/02/2016 11:30 AM Vicie Mutters, PA-C [facility] None  06/07/2016 10:00 AM Unk Pinto, MD [facility] None    Plan:   During the course of the visit the patient was educated and counseled about appropriate screening and preventive services including:    Pneumococcal vaccine   Influenza vaccine  Td vaccine  Prevnar 13  Screening electrocardiogram  Screening mammography  Bone densitometry screening  Colorectal cancer screening  Diabetes screening  Glaucoma screening  Nutrition counseling   Advanced directives: given info/requested copies   Subjective:   [name] is a 82 y.o. male who presents for Medicare Annual Wellness Visit and 3 month follow up on hypertension, prediabetes, hyperlipidemia, vitamin D def.   His blood pressure has been controlled at home, today their BP is    He does not workout. He denies chest pain, shortness of breath, dizziness. He has history of COPD, last CXR was.   He is not on cholesterol medication and denies myalgias. His cholesterol is at goal. The cholesterol last visit was:   Lab Results  Component Value Date   CHOL 150 11/30/2015   HDL 30 (L) 11/30/2015   LDLCALC 72 11/30/2015   TRIG 241 (H) 11/30/2015   CHOLHDL 5.0 11/30/2015    He has been working on diet and exercise for prediabetes, and denies paresthesia of the feet, polydipsia, polyuria and visual disturbances. Last A1C in the office was:  Lab Results  Component Value Date   HGBA1C 5.6 11/30/2015   Patient is on Vitamin D supplement.   Lab Results  Component Value Date   VD25OH 84 11/30/2015     Lab Results  Component Value Date   GFRNONAA 63 (L) 11/30/2015   BMI is There is no height or weight on file to calculate BMI., he is working  on diet and exercise. Wt Readings from Last 3 Encounters:  02/24/16 281 lb 12.8 oz (127.8 kg)  02/18/16 280 lb (127 kg)  12/21/15 276 lb (125.2 kg)    Medication Review Current Outpatient Prescriptions on File Prior to Visit  Medication Sig Dispense Refill  . albuterol (PROVENTIL HFA;VENTOLIN HFA) 108 (90 Base) MCG/ACT inhaler Inhale 2 puffs into the lungs every 6 (six) hours as needed for wheezing or shortness of breath (cough). 1 Inhaler 0  . doxycycline (VIBRAMYCIN) 100 MG capsule Take 2 caps now then 1 capsule 2 x/day on a full stomach for infection 30 capsule 0  . finasteride (PROSCAR) 5 MG tablet Take 1 tablet (5 mg total) by mouth daily. 90 tablet 3  . losartan (COZAAR) 100 MG tablet TAKE 1 BY MOUTH EVERY MORNING 90 tablet 3  . Multiple Vitamin (MULTIVITAMIN) tablet Take 1 tablet by mouth daily.     . predniSONE (DELTASONE) 20 MG tablet 1 tab 3 x day for 3 days, then 1 tab 2 x day for 3 days, then 1 tab 1 x day for 5 days 20 tablet 0  . predniSONE (DELTASONE) 5 MG tablet Take 5 mg by mouth daily with breakfast.    . terazosin (HYTRIN) 10 MG capsule TAKE 1 BY MOUTH EVERY MORNING 90 capsule 3  . Vitamin D, Ergocalciferol, (DRISDOL) 50000 units CAPS capsule TAKE ONE CAPSULE DAILY OR AS DIRECTED 90 capsule 3   No current facility-administered medications on file prior to  visit.     Allergies: Allergies  Allergen Reactions  . Ace Inhibitors   . Asa [Aspirin]     High Dose asprin  . Penicillins Hives  . Vasotec [Enalapril]     Cough  . Clindamycin/Lincomycin     Hives     Current Problems (verified) has Rheumatoid arthritis (HCC); GERD (gastroesophageal reflux disease); DJD (degenerative joint disease); IBS (irritable bowel syndrome); COPD (chronic obstructive pulmonary disease) ([place]); Benign prostatic hyperplasia; Asthma; Essential hypertension; Mixed hyperlipidemia; Prediabetes; Vitamin D deficiency; Medication management; Prostate cancer screening; Screening for rectal  cancer; Encounter for Medicare annual wellness exam; Obesity; and Abdominal obesity-metabolic syndrome type 3 on his problem list.  Screening Tests Immunization History  Administered Date(s) Administered  . DT 04/15/2014  . Influenza, High Dose Seasonal PF 04/15/2014, 02/16/2015, 02/18/2016  . Influenza-Unspecified 03/10/2013  . Pneumococcal Conjugate-13 04/15/2014  . Pneumococcal Polysaccharide-23 05/15/2001, 03/30/2011  . Td 05/15/2002  . Zoster 04/01/2012    Preventative care: Last colonoscopy: 2011 CXR 2017 DEXA 2017 Stress test 2012 Echo 2012  Prior vaccinations: TD or Tdap: 2015  Influenza: 2017 Pneumococcal: 2012 Prevnar13: 2015 Shingles/Zostavax: 2013  Names of Other Physician/Practitioners you currently use: 1. [facility]- here for primary care 2. Dr. Katy Fitch, eye doctor, last visit  3. Dr. Olena Heckle, dentist, last visit  Patient Care Team: Unk Pinto, MD as PCP - General (Internal Medicine)  Surgical: He  has a past surgical history that includes Transurethral resection of prostate (SEVERAL YRS AGO); transthoracic echocardiogram (04-26-2011  ); Cardiovascular stress test (04-26-2011  DR BRACKBILL); Lumbar fusion (X3  LAST ONE 1996); and Scrotal exploration (Left, 07/17/2012). Family His family history includes Colon cancer in his father; Diabetes in his sister; Heart disease in his brother and father; Hypertension in his father. Social history  He reports that he quit smoking about 35 years ago. He has quit using smokeless tobacco. His smokeless tobacco use included Chew. He reports that he does not drink alcohol or use drugs.  MEDICARE WELLNESS OBJECTIVES: Physical activity:   Cardiac risk factors:   Depression/mood screen:   Depression screen [facility] 2/9 11/30/2015  Decreased Interest 0  Down, Depressed, Hopeless 0  PHQ - 2 Score 0    ADLs:  In your present state of health, do you have any difficulty performing the  following activities: 11/30/2015 05/19/2015  Hearing? N N  Vision? N N  Difficulty concentrating or making decisions? N N  Walking or climbing stairs? N N  Dressing or bathing? N N  Doing errands, shopping? N N  Some recent data might be hidden    Cognitive Testing  Alert? Yes  Normal Appearance?Yes  Oriented to person? Yes  Place? Yes   Time? Yes  Recall of three objects?  Yes  Can perform simple calculations? Yes  Displays appropriate judgment?Yes  Can read the correct time from a watch face?Yes  EOL planning:     Objective:   There were no vitals filed for this visit. There is no height or weight on file to calculate BMI.  General appearance: alert, no distress, WD/WN,  male HEENT: normocephalic, sclerae anicteric, TMs pearly, nares patent, no discharge or erythema, pharynx normal Oral cavity: MMM, no lesions Neck: supple, no lymphadenopathy, no thyromegaly, no masses Heart: RRR, normal S1, S2, no murmurs Lungs: CTA bilaterally, no wheezes, rhonchi, or rales Abdomen: +bs, soft, non tender, non distended, no masses, no hepatomegaly, no splenomegaly Musculoskeletal: nontender, no swelling, no obvious deformity Extremities: no edema, no cyanosis,  no clubbing Pulses: 2+ symmetric, upper and lower extremities, normal cap refill Neurological: alert, oriented x 3, CN2-12 intact, strength normal upper extremities and lower extremities, sensation normal throughout, DTRs 2+ throughout, no cerebellar signs, gait normal Psychiatric: normal affect, behavior normal, pleasant  Breast: defer Gyn: defer Rectal: defer   Medicare Attestation I have personally reviewed: The patient's medical and social history Their use of alcohol, tobacco or illicit drugs Their current medications and supplements The patient's functional ability including ADLs,fall risks, home safety risks, cognitive, and hearing and visual impairment Diet and physical activities Evidence for depression or mood  disorders  The patient's weight, height, BMI, and visual acuity have been recorded in the chart.  I have made referrals, counseling, and provided education to the patient based on review of the above and I have provided the patient with a written personalized care plan for preventive services.     Vicie Mutters, PA-C   03/02/2016

## 2016-03-06 ENCOUNTER — Ambulatory Visit: Payer: Self-pay | Admitting: Physician Assistant

## 2016-03-06 NOTE — Progress Notes (Deleted)
MEDICARE ANNUAL WELLNESS VISIT AND FOLLOW UP  Assessment:    Over 30 minutes of exam, counseling, chart review, and critical decision making was performed  Future Appointments Date Time Provider [place]  03/06/2016 10:30 AM Vicie Mutters, PA-C [facility] None  06/07/2016 10:00 AM Unk Pinto, MD [facility] None    Plan:   During the course of the visit the patient was educated and counseled about appropriate screening and preventive services including:    Pneumococcal vaccine   Influenza vaccine  Td vaccine  Prevnar 13  Screening electrocardiogram  Screening mammography  Bone densitometry screening  Colorectal cancer screening  Diabetes screening  Glaucoma screening  Nutrition counseling   Advanced directives: given info/requested copies   Subjective:   [name] is a 82 y.o. male who presents for Medicare Annual Wellness Visit and 3 month follow up on hypertension, prediabetes, hyperlipidemia, vitamin D def.   His blood pressure has been controlled at home, today their BP is    He does not workout. He denies chest pain, shortness of breath, dizziness. He has history of COPD, last CXR was 12/2015. He has RA and follows with Dr. Trudie Reed, not on a DMARD.   He is not on cholesterol medication and denies myalgias. His cholesterol is at goal. The cholesterol last visit was:   Lab Results  Component Value Date   CHOL 150 11/30/2015   HDL 30 (L) 11/30/2015   LDLCALC 72 11/30/2015   TRIG 241 (H) 11/30/2015   CHOLHDL 5.0 11/30/2015    He has been working on diet and exercise for prediabetes, and denies paresthesia of the feet, polydipsia, polyuria and visual disturbances. Last A1C in the office was:  Lab Results  Component Value Date   HGBA1C 5.6 11/30/2015   Patient is on Vitamin D supplement.   Lab Results  Component Value Date   VD25OH 84 11/30/2015     Lab Results  Component Value Date   GFRNONAA 79 (L) 11/30/2015   BMI is There  is no height or weight on file to calculate BMI., he is working on diet and exercise. Wt Readings from Last 3 Encounters:  02/24/16 281 lb 12.8 oz (127.8 kg)  02/18/16 280 lb (127 kg)  12/21/15 276 lb (125.2 kg)    Medication Review Current Outpatient Prescriptions on File Prior to Visit  Medication Sig Dispense Refill  . albuterol (PROVENTIL HFA;VENTOLIN HFA) 108 (90 Base) MCG/ACT inhaler Inhale 2 puffs into the lungs every 6 (six) hours as needed for wheezing or shortness of breath (cough). 1 Inhaler 0  . finasteride (PROSCAR) 5 MG tablet Take 1 tablet (5 mg total) by mouth daily. 90 tablet 3  . losartan (COZAAR) 100 MG tablet TAKE 1 BY MOUTH EVERY MORNING 90 tablet 3  . Multiple Vitamin (MULTIVITAMIN) tablet Take 1 tablet by mouth daily.     . predniSONE (DELTASONE) 20 MG tablet 1 tab 3 x day for 3 days, then 1 tab 2 x day for 3 days, then 1 tab 1 x day for 5 days 20 tablet 0  . predniSONE (DELTASONE) 5 MG tablet Take 5 mg by mouth daily with breakfast.    . terazosin (HYTRIN) 10 MG capsule TAKE 1 BY MOUTH EVERY MORNING 90 capsule 3  . Vitamin D, Ergocalciferol, (DRISDOL) 50000 units CAPS capsule TAKE ONE CAPSULE DAILY OR AS DIRECTED 90 capsule 3   No current facility-administered medications on file prior to visit.     Allergies: Allergies  Allergen Reactions  .  Ace Inhibitors   . Asa [Aspirin]     High Dose asprin  . Penicillins Hives  . Vasotec [Enalapril]     Cough  . Clindamycin/Lincomycin     Hives     Current Problems (verified) has Rheumatoid arthritis (HCC); GERD (gastroesophageal reflux disease); DJD (degenerative joint disease); IBS (irritable bowel syndrome); COPD (chronic obstructive pulmonary disease) ([place]); Benign prostatic hyperplasia; Asthma; Essential hypertension; Mixed hyperlipidemia; Prediabetes; Vitamin D deficiency; Medication management; Prostate cancer screening; Screening for rectal cancer; Encounter for Medicare annual wellness exam; Obesity; and  Abdominal obesity-metabolic syndrome type 3 on his problem list.  Screening Tests Immunization History  Administered Date(s) Administered  . DT 04/15/2014  . Influenza, High Dose Seasonal PF 04/15/2014, 02/16/2015, 02/18/2016  . Influenza-Unspecified 03/10/2013  . Pneumococcal Conjugate-13 04/15/2014  . Pneumococcal Polysaccharide-23 05/15/2001, 03/30/2011  . Td 05/15/2002  . Zoster 04/01/2012    Preventative care: Last colonoscopy: 2011 CXR 2017 DEXA 2017 Stress test 2012 Echo 2012  Prior vaccinations: TD or Tdap: 2015  Influenza: 2017 Pneumococcal: 2012 Prevnar13: 2015 Shingles/Zostavax: 2013  Names of Other Physician/Practitioners you currently use: 1. [facility]- here for primary care 2. Dr. Katy Fitch, eye doctor, last visit  3. Dr. Olena Heckle, dentist, last visit  Patient Care Team: Unk Pinto, MD as PCP - General (Internal Medicine)  Surgical: He  has a past surgical history that includes Transurethral resection of prostate (SEVERAL YRS AGO); transthoracic echocardiogram (04-26-2011  ); Cardiovascular stress test (04-26-2011  DR BRACKBILL); Lumbar fusion (X3  LAST ONE 1996); and Scrotal exploration (Left, 07/17/2012). Family His family history includes Colon cancer in his father; Diabetes in his sister; Heart disease in his brother and father; Hypertension in his father. Social history  He reports that he quit smoking about 35 years ago. He has quit using smokeless tobacco. His smokeless tobacco use included Chew. He reports that he does not drink alcohol or use drugs.  MEDICARE WELLNESS OBJECTIVES: Physical activity:   Cardiac risk factors:   Depression/mood screen:   Depression screen [facility] 2/9 11/30/2015  Decreased Interest 0  Down, Depressed, Hopeless 0  PHQ - 2 Score 0    ADLs:  In your present state of health, do you have any difficulty performing the following activities: 11/30/2015 05/19/2015  Hearing? N N  Vision? N N   Difficulty concentrating or making decisions? N N  Walking or climbing stairs? N N  Dressing or bathing? N N  Doing errands, shopping? N N  Some recent data might be hidden    Cognitive Testing  Alert? Yes  Normal Appearance?Yes  Oriented to person? Yes  Place? Yes   Time? Yes  Recall of three objects?  Yes  Can perform simple calculations? Yes  Displays appropriate judgment?Yes  Can read the correct time from a watch face?Yes  EOL planning:     Objective:   There were no vitals filed for this visit. There is no height or weight on file to calculate BMI.  General appearance: alert, no distress, WD/WN,  male HEENT: normocephalic, sclerae anicteric, TMs pearly, nares patent, no discharge or erythema, pharynx normal Oral cavity: MMM, no lesions Neck: supple, no lymphadenopathy, no thyromegaly, no masses Heart: RRR, normal S1, S2, no murmurs Lungs: CTA bilaterally, no wheezes, rhonchi, or rales Abdomen: +bs, soft, non tender, non distended, no masses, no hepatomegaly, no splenomegaly Musculoskeletal: nontender, no swelling, no obvious deformity Extremities: no edema, no cyanosis, no clubbing Pulses: 2+ symmetric, upper and lower extremities, normal cap refill  Neurological: alert, oriented x 3, CN2-12 intact, strength normal upper extremities and lower extremities, sensation normal throughout, DTRs 2+ throughout, no cerebellar signs, gait normal Psychiatric: normal affect, behavior normal, pleasant  Breast: defer Gyn: defer Rectal: defer   Medicare Attestation I have personally reviewed: The patient's medical and social history Their use of alcohol, tobacco or illicit drugs Their current medications and supplements The patient's functional ability including ADLs,fall risks, home safety risks, cognitive, and hearing and visual impairment Diet and physical activities Evidence for depression or mood disorders  The patient's weight, height, BMI, and visual acuity have been  recorded in the chart.  I have made referrals, counseling, and provided education to the patient based on review of the above and I have provided the patient with a written personalized care plan for preventive services.     Vicie Mutters, PA-C   03/06/2016

## 2016-05-17 ENCOUNTER — Other Ambulatory Visit: Payer: Self-pay | Admitting: *Deleted

## 2016-05-17 DIAGNOSIS — C44619 Basal cell carcinoma of skin of left upper limb, including shoulder: Secondary | ICD-10-CM | POA: Diagnosis not present

## 2016-05-17 DIAGNOSIS — D1801 Hemangioma of skin and subcutaneous tissue: Secondary | ICD-10-CM | POA: Diagnosis not present

## 2016-05-17 DIAGNOSIS — L814 Other melanin hyperpigmentation: Secondary | ICD-10-CM | POA: Diagnosis not present

## 2016-05-17 DIAGNOSIS — C4441 Basal cell carcinoma of skin of scalp and neck: Secondary | ICD-10-CM | POA: Diagnosis not present

## 2016-05-17 DIAGNOSIS — L821 Other seborrheic keratosis: Secondary | ICD-10-CM | POA: Diagnosis not present

## 2016-05-17 DIAGNOSIS — Z85828 Personal history of other malignant neoplasm of skin: Secondary | ICD-10-CM | POA: Diagnosis not present

## 2016-05-17 DIAGNOSIS — D0439 Carcinoma in situ of skin of other parts of face: Secondary | ICD-10-CM | POA: Diagnosis not present

## 2016-05-17 MED ORDER — TERAZOSIN HCL 10 MG PO CAPS: ORAL_CAPSULE | ORAL | 0 refills | Status: DC

## 2016-05-17 MED ORDER — FINASTERIDE 5 MG PO TABS: 5.0000 mg | ORAL_TABLET | Freq: Every day | ORAL | 0 refills | Status: DC

## 2016-05-19 ENCOUNTER — Other Ambulatory Visit: Payer: Self-pay | Admitting: *Deleted

## 2016-05-19 MED ORDER — LOSARTAN POTASSIUM 100 MG PO TABS: ORAL_TABLET | ORAL | 0 refills | Status: DC

## 2016-06-06 NOTE — Progress Notes (Signed)
[place] [facility] [facility]   Unk Pinto, M.D.    Uvaldo Bristle. Silverio Lay, P.A.-C      Starlyn Skeans, P.A.-C  [facility]                [address] [place] [id number] Telephone [phone] Telefax [phone] Annual  Screening/Preventative Visit  & Comprehensive Evaluation & Examination     This very nice 83 y.o. MWM presents for a Screening/Preventative Visit & comprehensive evaluation and management of multiple medical co-morbidities.  Patient has been followed for HTN, Prediabetes, Hyperlipidemia and Vitamin D Deficiency. Patient was dx'd with Rheumatoid Arthritis in 2010 and is followed by Dr Berna Bue on low dose daily prednisone. Patient denies any significant arthralgias since on prednisone.      HTN predates since 1999. Patient's BP has been controlled at home.  Today's BP ss 142/64. Patient denies any cardiac symptoms as chest pain, palpitations,  dizziness or ankle swelling. He does report shortness of breath walking stairs or hills, but not on a level ground and this haas been an ongoing complaint for 30+ years. .     Patient's hyperlipidemia is controlled with diet and medications. Patient denies myalgias or other medication SE's. Last lipids were at goal albeit elevated Trig's:  Lab Results  Component Value Date   CHOL 150 11/30/2015   HDL 30 (L) 11/30/2015   LDLCALC 72 11/30/2015   TRIG 241 (H) 11/30/2015   CHOLHDL 5.0 11/30/2015      Patient has prediabetes with Morbid Obesity (BMI 33+) and consequent A1c 5.7%  since Oct 2013 and patient denies reactive hypoglycemic symptoms, visual blurring, diabetic polys or paresthesias. Last A1c was at goal: Lab Results  Component Value Date   HGBA1C 5.6 11/30/2015       Finally, patient has history of Vitamin D Deficiency in 2008 of "25"  and last vitamin D was at goal: Lab Results  Component Value Date   VD25OH 84 11/30/2015   Current  Outpatient Prescriptions on File Prior to Visit  Medication Sig  . albuterol  HFA inhaler 2 puffs  every 6 (six) hours as needed  . finasteride 5 MG Take 1 tab daily.  Marland Kitchen losartan 100 MG  TAKE 1  EVERY MORNING  . Multiple Vitamin Take 1 tabdaily.   . predniSONE  5 MG  Take  daily with breakfast.  . terazosin  10 MG  TAKE 1 Tab EVERY MORNING  . Vitamin D 5,000 units  TAKE ONE CAP DAILY OR AS DIRECTED   Allergies  Allergen Reactions  . Ace Inhibitors   . Asa [Aspirin]     High Dose asprin  . Penicillins Hives  . Vasotec [Enalapril]     Cough  . Clindamycin/Lincomycin     Hives    Past Medical History:  Diagnosis Date  . Allergy   . Asthma   . BPH (benign prostatic hypertrophy)   . BPH (benign prostatic hypertrophy)   . COPD (chronic obstructive pulmonary disease) ([place])   . DJD (degenerative joint disease)   . GERD (gastroesophageal reflux disease)   . History of urinary retention   . Hypertension   . IBS (irritable bowel syndrome)   . RBBB   . Rheumatoid arthritis(714.0)    DR. HAWKES  . Scrotal lesion   . Short of breath on exertion    Health Maintenance  Topic Date  Due  . TETANUS/TDAP  04/15/2024  . INFLUENZA VACCINE  Completed  . ZOSTAVAX  Completed  . PNA vac Low Risk Adult  Completed   Immunization History  Administered Date(s) Administered  . DT 04/15/2014  . Influenza, High Dose Seasonal PF 04/15/2014, 02/16/2015, 02/18/2016  . Influenza-Unspecified 03/10/2013  . Pneumococcal Conjugate-13 04/15/2014  . Pneumococcal Polysaccharide-23 05/15/2001, 03/30/2011  . Td 05/15/2002  . Zoster 04/01/2012   Past Surgical History:  Procedure Laterality Date  . CARDIOVASCULAR STRESS TEST  04-26-2011  DR BRACKBILL   LOW RISK STRESS NUCLEAR STUDY/ NO EVIDENCE ISCHEMIA  . LUMBAR FUSION  X3  LAST ONE 1996  . SCROTAL EXPLORATION Left 07/17/2012   Procedure:  EXCISION  OF LEFT SCROTAL SKIN LESION;  Surgeon: Molli Hazard, MD;  Location: [facility];   Service: Urology;  Laterality: Left;  . TRANSTHORACIC ECHOCARDIOGRAM  04-26-2011     MILD LVH/ LVSF NORMAL/ EF [id number] GRADE I DIASTOLIC DYSFUNCTION/ MILD MITRAL REGURG.  . TRANSURETHRAL RESECTION OF PROSTATE  SEVERAL YRS AGO   Family History  Problem Relation Age of Onset  . Heart disease Father   . Colon cancer Father   . Hypertension Father   . Diabetes Sister   . Heart disease Brother    Social History   Social History  . Marital status: Married    Spouse name: N/A  . Number of children: 2 daughters & 5 GC  . Years of education: N/A   Occupational History  . 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 Smoking status: Former Smoker    Quit date: 07/11/1980  . Smokeless tobacco: Former Systems developer    Types: Chew  . Alcohol use No  . Drug use: No  . Sexual activity: Not on file    ROS Constitutional: Denies fever, chills, weight loss/gain, headaches, insomnia,  night sweats or change in appetite. Does c/o fatigue. Eyes: Denies redness, blurred vision, diplopia, discharge, itchy or watery eyes.  ENT: Denies discharge, congestion, post nasal drip, epistaxis, sore throat, earache, hearing loss, dental pain, Tinnitus, Vertigo, Sinus pain or snoring.  Cardio: Denies chest pain, palpitations, irregular heartbeat, syncope, dyspnea, diaphoresis, orthopnea, PND, claudication or edema Respiratory: denies cough,  pleurisy, hoarseness, laryngitis or wheezing. Has DOE as above. Gastrointestinal: Denies dysphagia, heartburn, reflux, water brash, pain, cramps, nausea, vomiting, bloating, diarrhea, constipation, hematemesis, melena, hematochezia, jaundice or hemorrhoids Genitourinary: Denies dysuria, frequency, urgency, hesitancy, discharge, hematuria or flank pain. Has nocturia 2-3 x.,  Musculoskeletal: Denies arthralgia, myalgia, stiffness, Jt. Swelling, pain, limp or strain/sprain. Denies Falls. Skin: Denies puritis, rash, hives, warts, acne, eczema or change in skin  lesion Neuro: No weakness, tremor, incoordination, spasms, paresthesia or pain Psychiatric: Denies confusion, memory loss or sensory loss. Denies Depression. Endocrine: Denies change in weight, skin, hair change, nocturia, and paresthesia, diabetic polys, visual blurring or hyper / hypo glycemic episodes.  Heme/Lymph: No excessive bleeding, bruising or enlarged lymph nodes.  Physical Exam  BP (!) 142/60   Pulse 92   Temp 97.8 F (36.6 C)   Resp 16   Ht 6' 4.75" (1.949 m)   Wt 281 lb (127.5 kg)   BMI 33.54 kg/m   General Appearance: Well nourished, in no apparent distress.  Eyes: PERRLA, EOMs, conjunctiva no swelling or erythema, normal fundi and vessels. Sinuses: No frontal/maxillary tenderness ENT/Mouth: EACs patent / TMs  nl. Nares clear without erythema, swelling, mucoid exudates. Oral hygiene is good. No erythema, swelling, or exudate. Tongue normal, non-obstructing.  Tonsils not swollen or erythematous. Hearing normal.  Neck: Supple, thyroid normal. No bruits, nodes or JVD. Respiratory: Respiratory effort normal.  BS equal and clear bilateral without rales, rhonci, wheezing or stridor. Cardio: Heart sounds are normal with regular rate and rhythm and no murmurs, rubs or gallops. Peripheral pulses are normal and equal bilaterally without edema. No aortic or femoral bruits. Chest: symmetric with normal excursions and percussion.  Abdomen: Soft, obese with Nl bowel sounds. Nontender, no guarding, rebound, hernias, masses, or organomegaly.  Lymphatics: Non tender without lymphadenopathy.  Genitourinary: No hernias.Testes nl. DRE - prostate nl for age - smooth & firm w/o nodules. Musculoskeletal: Full ROM all peripheral extremities, joint stability, 5/5 strength, and normal gait. Skin: Warm and dry without rashes, lesions, cyanosis, clubbing or  ecchymosis.  Neuro: Cranial nerves intact, reflexes equal bilaterally. Normal muscle tone, no cerebellar symptoms. Sensation intact.  Pysch:  Alert and oriented X 3 with normal affect, insight and judgment appropriate.   Assessment and Plan  1. Annual Preventative/Screening Exam    2. Essential hypertension  - Microalbumin / creatinine urine ratio - EKG 12-Lead - [place], RETROPERITNL ABD,  LTD - Urinalysis, Routine w reflex microscopic - CBC with Differential/Platelet - BASIC METABOLIC PANEL WITH GFR - TSH  3. Mixed hyperlipidemia  - EKG 12-Lead - [place], RETROPERITNL ABD,  LTD - Lipid panel - TSH  4. Prediabetes  - EKG 12-Lead - [place], RETROPERITNL ABD,  LTD - Hemoglobin A1c - Insulin, random  5. Vitamin D deficiency  - VITAMIN D 25 Hydroxy   6. Screening for ischemic heart disease  - EKG 12-Lead  7. Screening for AAA (aortic abdominal aneurysm)  - [place], RETROPERITNL ABD,  LTD  8. Screening for rectal cancer  - POC Hemoccult Bld/Stl   9. Prostate cancer screening  - PSA  10. Medication management  - Urinalysis, Routine w reflex microscopic - CBC with Differential/Platelet - BASIC METABOLIC PANEL WITH GFR - Hepatic function panel - Magnesium       Continue prudent diet as discussed, weight control, BP monitoring, regular exercise, and medications as discussed.  Discussed med effects and SE's. Routine screening labs and tests as requested with regular follow-up as recommended. Over 40 minutes of exam, counseling, chart review and high complex critical decision making was performed

## 2016-06-07 ENCOUNTER — Encounter: Payer: Self-pay | Admitting: Internal Medicine

## 2016-06-07 ENCOUNTER — Ambulatory Visit (INDEPENDENT_AMBULATORY_CARE_PROVIDER_SITE_OTHER): Payer: PPO | Admitting: Internal Medicine

## 2016-06-07 ENCOUNTER — Other Ambulatory Visit: Payer: Self-pay | Admitting: Internal Medicine

## 2016-06-07 VITALS — BP 142/60 | HR 92 | Temp 97.8°F | Resp 16 | Ht 76.75 in | Wt 281.0 lb

## 2016-06-07 DIAGNOSIS — Z136 Encounter for screening for cardiovascular disorders: Secondary | ICD-10-CM

## 2016-06-07 DIAGNOSIS — Z125 Encounter for screening for malignant neoplasm of prostate: Secondary | ICD-10-CM

## 2016-06-07 DIAGNOSIS — R6889 Other general symptoms and signs: Secondary | ICD-10-CM

## 2016-06-07 DIAGNOSIS — E559 Vitamin D deficiency, unspecified: Secondary | ICD-10-CM | POA: Diagnosis not present

## 2016-06-07 DIAGNOSIS — I10 Essential (primary) hypertension: Secondary | ICD-10-CM | POA: Diagnosis not present

## 2016-06-07 DIAGNOSIS — R7303 Prediabetes: Secondary | ICD-10-CM

## 2016-06-07 DIAGNOSIS — Z0001 Encounter for general adult medical examination with abnormal findings: Secondary | ICD-10-CM

## 2016-06-07 DIAGNOSIS — N390 Urinary tract infection, site not specified: Secondary | ICD-10-CM | POA: Diagnosis not present

## 2016-06-07 DIAGNOSIS — E782 Mixed hyperlipidemia: Secondary | ICD-10-CM

## 2016-06-07 DIAGNOSIS — Z79899 Other long term (current) drug therapy: Secondary | ICD-10-CM

## 2016-06-07 DIAGNOSIS — N320 Bladder-neck obstruction: Secondary | ICD-10-CM | POA: Diagnosis not present

## 2016-06-07 DIAGNOSIS — Z1212 Encounter for screening for malignant neoplasm of rectum: Secondary | ICD-10-CM

## 2016-06-07 LAB — CBC WITH DIFFERENTIAL/PLATELET
BASOS ABS: 0 {cells}/uL (ref 0–200)
BASOS PCT: 0 %
EOS PCT: 1 %
Eosinophils Absolute: 91 cells/uL (ref 15–500)
HCT: 42.3 % (ref 38.5–50.0)
HEMOGLOBIN: 13.9 g/dL (ref 13.2–17.1)
LYMPHS ABS: 2457 {cells}/uL (ref 850–3900)
Lymphocytes Relative: 27 %
MCH: 30.5 pg (ref 27.0–33.0)
MCHC: 32.9 g/dL (ref 32.0–36.0)
MCV: 93 fL (ref 80.0–100.0)
MPV: 9.5 fL (ref 7.5–12.5)
Monocytes Absolute: 910 cells/uL (ref 200–950)
Monocytes Relative: 10 %
NEUTROS ABS: 5642 {cells}/uL (ref 1500–7800)
Neutrophils Relative %: 62 %
Platelets: 255 10*3/uL (ref 140–400)
RBC: 4.55 MIL/uL (ref 4.20–5.80)
RDW: 13.7 % (ref 11.0–15.0)
WBC: 9.1 10*3/uL (ref 3.8–10.8)

## 2016-06-07 LAB — PSA: PSA: 0.4 ng/mL (ref <=4.0)

## 2016-06-07 LAB — TSH: TSH: 3.58 m[IU]/L (ref 0.40–4.50)

## 2016-06-07 NOTE — Patient Instructions (Signed)

## 2016-06-08 ENCOUNTER — Encounter: Payer: Self-pay | Admitting: *Deleted

## 2016-06-08 LAB — URINALYSIS, MICROSCOPIC ONLY
Bacteria, UA: NONE SEEN [HPF]
Casts: NONE SEEN [LPF]
Yeast: NONE SEEN [HPF]

## 2016-06-08 LAB — URINALYSIS, ROUTINE W REFLEX MICROSCOPIC
BILIRUBIN URINE: NEGATIVE
Glucose, UA: NEGATIVE
Hgb urine dipstick: NEGATIVE
LEUKOCYTES UA: NEGATIVE
Nitrite: POSITIVE — AB
SPECIFIC GRAVITY, URINE: 1.025 (ref 1.001–1.035)
pH: 5.5 (ref 5.0–8.0)

## 2016-06-08 LAB — VITAMIN D 25 HYDROXY (VIT D DEFICIENCY, FRACTURES): VIT D 25 HYDROXY: 76 ng/mL (ref 30–100)

## 2016-06-08 LAB — BASIC METABOLIC PANEL WITH GFR
BUN: 15 mg/dL (ref 7–25)
CHLORIDE: 105 mmol/L (ref 98–110)
CO2: 26 mmol/L (ref 20–31)
Calcium: 9.1 mg/dL (ref 8.6–10.3)
Creat: 1.31 mg/dL — ABNORMAL HIGH (ref 0.70–1.11)
GFR, EST NON AFRICAN AMERICAN: 50 mL/min — AB (ref >=60)
GFR, Est African American: 58 mL/min — ABNORMAL LOW (ref >=60)
Glucose, Bld: 93 mg/dL (ref 65–99)
POTASSIUM: 3.9 mmol/L (ref 3.5–5.3)
SODIUM: 144 mmol/L (ref 135–146)

## 2016-06-08 LAB — MICROALBUMIN / CREATININE URINE RATIO
Creatinine, Urine: 424 mg/dL — ABNORMAL HIGH (ref 20–370)
Microalb Creat Ratio: 35 mcg/mg creat — ABNORMAL HIGH (ref <30)
Microalb, Ur: 14.7 mg/dL

## 2016-06-08 LAB — HEMOGLOBIN A1C
HEMOGLOBIN A1C: 5.4 % (ref <5.7)
Mean Plasma Glucose: 108 mg/dL

## 2016-06-08 LAB — MAGNESIUM: MAGNESIUM: 1.9 mg/dL (ref 1.5–2.5)

## 2016-06-08 LAB — HEPATIC FUNCTION PANEL
ALK PHOS: 81 U/L (ref 40–115)
ALT: 18 U/L (ref 9–46)
AST: 17 U/L (ref 10–35)
Albumin: 3.9 g/dL (ref 3.6–5.1)
BILIRUBIN DIRECT: 0.1 mg/dL (ref <=0.2)
BILIRUBIN INDIRECT: 0.5 mg/dL (ref 0.2–1.2)
BILIRUBIN TOTAL: 0.6 mg/dL (ref 0.2–1.2)
Total Protein: 6.6 g/dL (ref 6.1–8.1)

## 2016-06-08 LAB — LIPID PANEL
CHOL/HDL RATIO: 5.4 ratio — AB (ref <5.0)
Cholesterol: 174 mg/dL (ref <200)
HDL: 32 mg/dL — AB (ref >40)
LDL Cholesterol: 97 mg/dL (ref <100)
Triglycerides: 225 mg/dL — ABNORMAL HIGH (ref <150)
VLDL: 45 mg/dL — AB (ref <30)

## 2016-06-08 LAB — INSULIN, RANDOM: Insulin: 33.3 u[IU]/mL — ABNORMAL HIGH (ref 2.0–19.6)

## 2016-06-09 LAB — URINE CULTURE

## 2016-06-14 ENCOUNTER — Other Ambulatory Visit: Payer: Self-pay | Admitting: Internal Medicine

## 2016-06-16 ENCOUNTER — Other Ambulatory Visit: Payer: Self-pay

## 2016-06-16 DIAGNOSIS — Z1212 Encounter for screening for malignant neoplasm of rectum: Secondary | ICD-10-CM

## 2016-06-16 LAB — POC HEMOCCULT BLD/STL (HOME/3-CARD/SCREEN)
Card #3 Fecal Occult Blood, POC: NEGATIVE
FECAL OCCULT BLD: NEGATIVE
FECAL OCCULT BLD: NEGATIVE

## 2016-08-15 ENCOUNTER — Other Ambulatory Visit: Payer: Self-pay | Admitting: *Deleted

## 2016-08-15 MED ORDER — FINASTERIDE 5 MG PO TABS: 5.0000 mg | ORAL_TABLET | Freq: Every day | ORAL | 1 refills | Status: DC

## 2016-08-15 MED ORDER — LOSARTAN POTASSIUM 100 MG PO TABS: ORAL_TABLET | ORAL | 1 refills | Status: DC

## 2016-08-15 MED ORDER — TERAZOSIN HCL 10 MG PO CAPS: ORAL_CAPSULE | ORAL | 1 refills | Status: DC

## 2016-09-08 ENCOUNTER — Ambulatory Visit: Payer: Self-pay | Admitting: Internal Medicine

## 2016-10-30 ENCOUNTER — Other Ambulatory Visit: Payer: Self-pay | Admitting: Internal Medicine

## 2016-10-30 MED ORDER — PREDNISONE 10 MG PO TABS: ORAL_TABLET | ORAL | 0 refills | Status: DC

## 2016-10-30 MED ORDER — AZITHROMYCIN 250 MG PO TABS
ORAL_TABLET | ORAL | 0 refills | Status: DC
Start: 2016-10-30 — End: 2017-02-19

## 2016-11-24 DIAGNOSIS — D2312 Other benign neoplasm of skin of left eyelid, including canthus: Secondary | ICD-10-CM | POA: Diagnosis not present

## 2016-11-24 DIAGNOSIS — H40033 Anatomical narrow angle, bilateral: Secondary | ICD-10-CM | POA: Diagnosis not present

## 2016-11-24 DIAGNOSIS — H53002 Unspecified amblyopia, left eye: Secondary | ICD-10-CM | POA: Diagnosis not present

## 2016-11-24 DIAGNOSIS — H11123 Conjunctival concretions, bilateral: Secondary | ICD-10-CM | POA: Diagnosis not present

## 2016-11-24 DIAGNOSIS — H10413 Chronic giant papillary conjunctivitis, bilateral: Secondary | ICD-10-CM | POA: Diagnosis not present

## 2016-11-24 DIAGNOSIS — H04123 Dry eye syndrome of bilateral lacrimal glands: Secondary | ICD-10-CM | POA: Diagnosis not present

## 2016-11-24 DIAGNOSIS — H2513 Age-related nuclear cataract, bilateral: Secondary | ICD-10-CM | POA: Diagnosis not present

## 2016-12-11 ENCOUNTER — Ambulatory Visit: Payer: Self-pay | Admitting: Internal Medicine

## 2017-01-08 DIAGNOSIS — L570 Actinic keratosis: Secondary | ICD-10-CM | POA: Diagnosis not present

## 2017-01-08 DIAGNOSIS — L820 Inflamed seborrheic keratosis: Secondary | ICD-10-CM | POA: Diagnosis not present

## 2017-01-08 DIAGNOSIS — D2262 Melanocytic nevi of left upper limb, including shoulder: Secondary | ICD-10-CM | POA: Diagnosis not present

## 2017-01-08 DIAGNOSIS — C44619 Basal cell carcinoma of skin of left upper limb, including shoulder: Secondary | ICD-10-CM | POA: Diagnosis not present

## 2017-01-08 DIAGNOSIS — D0439 Carcinoma in situ of skin of other parts of face: Secondary | ICD-10-CM | POA: Diagnosis not present

## 2017-01-08 DIAGNOSIS — D692 Other nonthrombocytopenic purpura: Secondary | ICD-10-CM | POA: Diagnosis not present

## 2017-01-08 DIAGNOSIS — D2261 Melanocytic nevi of right upper limb, including shoulder: Secondary | ICD-10-CM | POA: Diagnosis not present

## 2017-01-08 DIAGNOSIS — D1801 Hemangioma of skin and subcutaneous tissue: Secondary | ICD-10-CM | POA: Diagnosis not present

## 2017-01-08 DIAGNOSIS — D225 Melanocytic nevi of trunk: Secondary | ICD-10-CM | POA: Diagnosis not present

## 2017-01-08 DIAGNOSIS — Z85828 Personal history of other malignant neoplasm of skin: Secondary | ICD-10-CM | POA: Diagnosis not present

## 2017-01-08 DIAGNOSIS — L821 Other seborrheic keratosis: Secondary | ICD-10-CM | POA: Diagnosis not present

## 2017-02-19 ENCOUNTER — Other Ambulatory Visit: Payer: Self-pay | Admitting: Internal Medicine

## 2017-02-19 MED ORDER — AZITHROMYCIN 250 MG PO TABS: ORAL_TABLET | ORAL | 0 refills | Status: DC

## 2017-03-20 ENCOUNTER — Other Ambulatory Visit: Payer: Self-pay | Admitting: Internal Medicine

## 2017-04-25 DIAGNOSIS — E669 Obesity, unspecified: Secondary | ICD-10-CM | POA: Diagnosis not present

## 2017-04-25 DIAGNOSIS — M0579 Rheumatoid arthritis with rheumatoid factor of multiple sites without organ or systems involvement: Secondary | ICD-10-CM | POA: Diagnosis not present

## 2017-04-25 DIAGNOSIS — Z7952 Long term (current) use of systemic steroids: Secondary | ICD-10-CM | POA: Diagnosis not present

## 2017-04-25 DIAGNOSIS — M25552 Pain in left hip: Secondary | ICD-10-CM | POA: Diagnosis not present

## 2017-04-25 DIAGNOSIS — M25551 Pain in right hip: Secondary | ICD-10-CM | POA: Diagnosis not present

## 2017-04-25 DIAGNOSIS — Z6836 Body mass index (BMI) 36.0-36.9, adult: Secondary | ICD-10-CM | POA: Diagnosis not present

## 2017-05-14 DIAGNOSIS — C44319 Basal cell carcinoma of skin of other parts of face: Secondary | ICD-10-CM | POA: Diagnosis not present

## 2017-05-14 DIAGNOSIS — Z85828 Personal history of other malignant neoplasm of skin: Secondary | ICD-10-CM | POA: Diagnosis not present

## 2017-05-14 DIAGNOSIS — L821 Other seborrheic keratosis: Secondary | ICD-10-CM | POA: Diagnosis not present

## 2017-05-14 DIAGNOSIS — D1801 Hemangioma of skin and subcutaneous tissue: Secondary | ICD-10-CM | POA: Diagnosis not present

## 2017-05-14 DIAGNOSIS — L820 Inflamed seborrheic keratosis: Secondary | ICD-10-CM | POA: Diagnosis not present

## 2017-05-14 DIAGNOSIS — L814 Other melanin hyperpigmentation: Secondary | ICD-10-CM | POA: Diagnosis not present

## 2017-05-14 DIAGNOSIS — L570 Actinic keratosis: Secondary | ICD-10-CM | POA: Diagnosis not present

## 2017-05-28 ENCOUNTER — Encounter: Payer: Self-pay | Admitting: Adult Health

## 2017-05-28 ENCOUNTER — Ambulatory Visit (INDEPENDENT_AMBULATORY_CARE_PROVIDER_SITE_OTHER): Payer: PPO | Admitting: Adult Health

## 2017-05-28 VITALS — BP 138/70 | HR 95 | Temp 98.2°F | Ht 76.75 in | Wt 282.0 lb

## 2017-05-28 DIAGNOSIS — J449 Chronic obstructive pulmonary disease, unspecified: Secondary | ICD-10-CM

## 2017-05-28 DIAGNOSIS — J209 Acute bronchitis, unspecified: Secondary | ICD-10-CM | POA: Diagnosis not present

## 2017-05-28 DIAGNOSIS — R0602 Shortness of breath: Secondary | ICD-10-CM

## 2017-05-28 MED ORDER — LEVOFLOXACIN 750 MG PO TABS: 750.0000 mg | ORAL_TABLET | Freq: Every day | ORAL | 0 refills | Status: AC

## 2017-05-28 MED ORDER — PREDNISONE 20 MG PO TABS: ORAL_TABLET | ORAL | 0 refills | Status: DC

## 2017-05-28 MED ORDER — BUDESONIDE-FORMOTEROL FUMARATE 160-4.5 MCG/ACT IN AERO: 2.0000 | INHALATION_SPRAY | Freq: Two times a day (BID) | RESPIRATORY_TRACT | 12 refills | Status: DC

## 2017-05-28 NOTE — Patient Instructions (Signed)
Take 1 puff of breo inhaler daily - call back if this is helpful and we can send in prescription to continue.    HOW TO TREAT VIRAL COUGH AND COLD SYMPTOMS:  -Symptoms usually last at least 1 week with the worst symptoms being around day 4.  - colds usually start with a sore throat and end with a cough, and the cough can take 2 weeks to get better.  -No antibiotics are needed for colds, flu, sore throats, cough, bronchitis UNLESS symptoms are longer than 7 days OR if you are getting better then get drastically worse.  -There are a lot of combination medications (Dayquil, Nyquil, Vicks 44, tyelnol cold and sinus, ETC). Please look at the ingredients on the back so that you are treating the correct symptoms and not doubling up on medications/ingredients.    Medicines you can use  Nasal congestion  - pseudoephedrine (Sudafed)- behind the counter, do not use if you have high blood pressure, medicine that have -D in them.  - phenylephrine (Sudafed PE) -Dextormethorphan + chlorpheniramine (Coridcidin HBP)- okay if you have high blood pressure -Oxymetazoline (Afrin) nasal spray- LIMIT to 3 days -Saline nasal spray -Neti pot (used distilled or bottled water)  Ear pain/congestion  -pseudoephedrine (sudafed) - Nasonex/flonase nasal spray  Fever  -Acetaminophen (Tyelnol) -Ibuprofen (Advil, motrin, aleve)  Sore Throat  -Acetaminophen (Tyelnol) -Ibuprofen (Advil, motrin, aleve) -Drink a lot of water -Gargle with salt water - Rest your voice (don't talk) -Throat sprays -Cough drops  Body Aches  -Acetaminophen (Tyelnol) -Ibuprofen (Advil, motrin, aleve)  Headache  -Acetaminophen (Tyelnol) -Ibuprofen (Advil, motrin, aleve) - Exedrin, Exedrin Migraine  Allergy symptoms (cough, sneeze, runny nose, itchy eyes) -Claritin or loratadine cheapest but likely the weakest  -Zyrtec or certizine at night because it can make you sleepy -The strongest is allegra or fexafinadine  Cheapest at  [employer], [employer], [employer]  Cough  -Dextromethorphan (Delsym)- medicine that has DM in it -Guafenesin (Mucinex/Robitussin) - cough drops - drink lots of water  Chest Congestion  -Guafenesin (Mucinex/Robitussin)  Red Itchy Eyes  - Naphcon-A  Upset Stomach  - Bland diet (nothing spicy, greasy, fried, and high acid foods like tomatoes, oranges, berries) -OKAY- cereal, bread, soup, crackers, rice -Eat smaller more frequent meals -reduce caffeine, no alcohol -Loperamide (Imodium-AD) if diarrhea -Prevacid for heart burn  General health when sick  -Hydration -wash your hands frequently -keep surfaces clean -change pillow cases and sheets often -Get fresh air but do not exercise strenuously -Vitamin D, double up on it - Vitamin C -Zinc

## 2017-05-28 NOTE — Progress Notes (Signed)
Assessment and Plan:  [name] was seen today for cough, fatigue and wheezing.  Diagnoses and all orders for this visit:  Acute bronchitis, unspecified organism ? Pneumonia in bilateral bases, COPD hx, ongoing dyspnea -     predniSONE (DELTASONE) 20 MG tablet; 2 tablets daily for 3 days, 1 tablet daily for 4 days. Then resume maintenance dose for RA.  -     levofloxacin (LEVAQUIN) 750 MG tablet; Take 1 tablet (750 mg total) by mouth daily for 5 days.  Shortness of breath -     DG Chest 2 View; Future  Chronic obstructive pulmonary disease, unspecified COPD type ([place])       Breo sample provided -     budesonide-formoterol (SYMBICORT) 160-4.5 MCG/ACT inhaler; Inhale 2 puffs into the lungs 2 (two) times daily.       Discussed if he continues to have dyspnea despite new inhaler will obtain pulmonary function tests and consider cardiology referral for follow up ECHO.   Go to the ER if any chest pain, shortness of breath, nausea, dizziness, severe HA, changes vision/speech  Further disposition pending results of labs. Discussed med's effects and SE's.   Over 15 minutes of exam, counseling, chart review, and critical decision making was performed.   Future Appointments  Date Time Provider [place]  07/09/2017 10:00 AM Unk Pinto, MD [facility] None    ------------------------------------------------------------------------------------------------------------------   HPI BP 138/70   Pulse 95   Temp 98.2 F (36.8 C)   Ht 6' 4.75" (1.949 m)   Wt 282 lb (127.9 kg)   SpO2 98%   BMI 33.66 kg/m   84 y.o.male presents for sore throat, running nose, wheezing, "feeling run out" - fatigue/malaise x 1 day. He reports he has had intermittent cough x 1 month. He denies fever/chills, headaches, dizziness, weakness, facial pain/pressure, rash. He reports baseline dyspnea with exertion prior to this episode, feels it is somewhat worse.   He has dx of COPD due to hyperinflation on  lung XR; has not been treated by inhalers thus far. ECHO from 04/26/2011 reviewed, unremarkable at that time. He denies CP, palpitations, dizziness, n/v, extremity/neck pain, weakness.   Past Medical History:  Diagnosis Date  . Allergy   . Asthma   . BPH (benign prostatic hypertrophy)   . BPH (benign prostatic hypertrophy)   . COPD (chronic obstructive pulmonary disease) ([place])   . DJD (degenerative joint disease)   . GERD (gastroesophageal reflux disease)   . History of urinary retention   . Hypertension   . IBS (irritable bowel syndrome)   . RBBB   . Rheumatoid arthritis(714.0)    DR. HAWKES  . Scrotal lesion   . Short of breath on exertion      Allergies  Allergen Reactions  . Ace Inhibitors   . Asa [Aspirin]     High Dose asprin  . Penicillins Hives  . Vasotec [Enalapril]     Cough  . Clindamycin/Lincomycin     Hives     Current Outpatient Medications on File Prior to Visit  Medication Sig  . finasteride (PROSCAR) 5 MG tablet Take 1 tablet by mouth every day  . losartan (COZAAR) 100 MG tablet Take 1 tablet by mouth every morning  . Multiple Vitamin (MULTIVITAMIN) tablet Take 1 tablet by mouth daily.   . predniSONE (DELTASONE) 5 MG tablet Take 5 mg by mouth daily with breakfast.  . terazosin (HYTRIN) 10 MG capsule Take 1 capsule by mouth every morning  . Vitamin D, Ergocalciferol, (  DRISDOL) 50000 units CAPS capsule Take 1 capsule by mouth every day or as directed  . albuterol (PROVENTIL HFA;VENTOLIN HFA) 108 (90 Base) MCG/ACT inhaler Inhale 2 puffs into the lungs every 6 (six) hours as needed for wheezing or shortness of breath (cough). (Patient not taking: Reported on 05/28/2017)  . azithromycin (ZITHROMAX) 250 MG tablet Take 2 tablets (500 mg) on  Day 1,  followed by 1 tablet (250 mg) once daily on Days 2 through 5. (Patient not taking: Reported on 05/28/2017)  . predniSONE (DELTASONE) 10 MG tablet 131 tab 3 x day for 2 days, then 1 tab 2 x day for 2 days, then 1 tab 1 x  day for 3 days (Patient not taking: Reported on 05/28/2017)   No current facility-administered medications on file prior to visit.     ROS: all negative except above.   Physical Exam:  BP 138/70   Pulse 95   Temp 98.2 F (36.8 C)   Ht 6' 4.75" (1.949 m)   Wt 282 lb (127.9 kg)   SpO2 98%   BMI 33.66 kg/m   General Appearance: Well nourished, in no acute distress, appears fatigued.  Eyes: PERRLA, EOMs, conjunctiva no swelling or erythema Sinuses: No Frontal/maxillary tenderness ENT/Mouth: Ext aud canals clear, TMs without erythema, bulging. No erythema, swelling, or exudate on post pharynx.  Tonsils not swollen or erythematous. Hearing normal.  Neck: Supple, thyroid normal.  Respiratory: Respiratory effort normal, audible fine wheezes with deep breaths and expiration. On auscultation, BS equal bilaterally with coarse rales through bilteral bases, improve but do not clear with cough, without wheezing or stridor.  Cardio: RRR with no MRGs. Brisk peripheral pulses without edema.  Abdomen: Soft, + BS.  Non tender, no guarding, rebound, hernias, masses. Lymphatics: Non tender without lymphadenopathy.  Musculoskeletal: Full ROM, 5/5 strength, normal gait.  Skin: Warm, dry without rashes, lesions, ecchymosis.  Neuro: Cranial nerves intact. Normal muscle tone, no cerebellar symptoms. Sensation intact.  Psych: Awake and oriented X 3, normal affect, Insight and Judgment appropriate.     Izora Ribas, NP 3:45 PM [facility]

## 2017-05-29 ENCOUNTER — Ambulatory Visit (HOSPITAL_COMMUNITY)
Admission: RE | Admit: 2017-05-29 | Discharge: 2017-05-29 | Disposition: A | Discharge status: Home / Self Care | Payer: PPO | Source: Ambulatory Visit | Attending: Adult Health | Admitting: Adult Health

## 2017-05-29 DIAGNOSIS — J449 Chronic obstructive pulmonary disease, unspecified: Secondary | ICD-10-CM | POA: Diagnosis not present

## 2017-05-29 DIAGNOSIS — R0602 Shortness of breath: Secondary | ICD-10-CM | POA: Insufficient documentation

## 2017-05-29 DIAGNOSIS — R05 Cough: Secondary | ICD-10-CM | POA: Diagnosis not present

## 2017-05-30 ENCOUNTER — Other Ambulatory Visit: Payer: Self-pay | Admitting: Internal Medicine

## 2017-05-30 MED ORDER — BENZONATATE 200 MG PO CAPS: ORAL_CAPSULE | ORAL | 1 refills | Status: DC

## 2017-05-30 MED ORDER — PROMETHAZINE-DM 6.25-15 MG/5ML PO SYRP: ORAL_SOLUTION | ORAL | 1 refills | Status: DC

## 2017-05-30 MED ORDER — PREDNISONE 20 MG PO TABS
ORAL_TABLET | ORAL | 0 refills | Status: DC
Start: 2017-05-30 — End: 2017-06-08

## 2017-05-31 ENCOUNTER — Ambulatory Visit (INDEPENDENT_AMBULATORY_CARE_PROVIDER_SITE_OTHER): Payer: PPO | Admitting: Internal Medicine

## 2017-05-31 VITALS — BP 144/70 | HR 92 | Temp 97.5°F | Resp 18 | Ht 76.75 in | Wt 284.6 lb

## 2017-05-31 DIAGNOSIS — J4521 Mild intermittent asthma with (acute) exacerbation: Secondary | ICD-10-CM | POA: Diagnosis not present

## 2017-06-03 ENCOUNTER — Encounter: Payer: Self-pay | Admitting: Internal Medicine

## 2017-06-03 NOTE — Progress Notes (Addendum)
Subjective:    Patient ID: [name], male    DOB: 1930-11-29, 84 y.o.   MRN: [id number]  HPI  This very nice 84 yo MWM with COPD/Asthmatic Bronchitis 3 days previous for Acute Bronchitis  And treated with Levaquin , Prednisone and given Breo sx's. Patient reports improvement over the last 24 hours with some intermittent wheezing and sputum is clearing.   Medication Sig  . albuterol (PROVENTIL HFA;VENTOLIN HFA) 108 (90 Base) MCG/ACT inhaler Inhale 2 puffs into the lungs every 6 (six) hours as needed for wheezing or shortness of breath (cough).  . benzonatate (TESSALON) 200 MG capsule Take 1 perle 3 x / day to prevent cough  . budesonide-formoterol (SYMBICORT) 160-4.5 MCG/ACT inhaler Inhale 2 puffs into the lungs 2 (two) times daily.  . finasteride (PROSCAR) 5 MG tablet Take 1 tablet by mouth every day  . LEVAQUIN 750 MG tablet Take 1 tablet (750 mg total) by mouth daily for 5 days.  Marland Kitchen losartan  100 MG tablet Take 1 tablet by mouth every morning  . Multiple Vitamin  Take 1 tablet by mouth daily.   . predniSONE 20 MG tablet 1 tab 3 x day for 3 days, then 1 tab 2 x day for 3 days, then 1 tab 1 x day for 5 days  . PROMETHAZINE-DM 6.25-15 MG/5ML syrup Take 1 to 2 tsp enery 4 hours if needed for cough  . terazosin ( 10 MG capsule Take 1 capsule by mouth every morning  . Vitamin D 50000 units CAPS Take 1 capsule by mouth every day or as directed  . predniSONE  5 MG tablet Take 5 mg by mouth daily with breakfast.   Allergies  Allergen Reactions  . Ace Inhibitors   . Asa [Aspirin]     High Dose asprin  . Penicillins Hives  . Vasotec [Enalapril]     Cough  . Clindamycin/Lincomycin     Hives    Past Medical History:  Diagnosis Date  . Allergy   . Asthma   . BPH (benign prostatic hypertrophy)   . BPH (benign prostatic hypertrophy)   . COPD (chronic obstructive pulmonary disease) ([place])   . DJD (degenerative joint disease)   . GERD (gastroesophageal reflux disease)   . History of  urinary retention   . Hypertension   . IBS (irritable bowel syndrome)   . RBBB   . Rheumatoid arthritis(714.0)    DR. HAWKES  . Scrotal lesion   . Short of breath on exertion    Past Surgical History:  Procedure Laterality Date  . CARDIOVASCULAR STRESS TEST  04-26-2011  DR BRACKBILL   LOW RISK STRESS NUCLEAR STUDY/ NO EVIDENCE ISCHEMIA  . LUMBAR FUSION  X3  LAST ONE 1996  . SCROTAL EXPLORATION Left 07/17/2012   Procedure:  EXCISION  OF LEFT SCROTAL SKIN LESION;  Surgeon: Molli Hazard, MD;  Location: [facility];  Service: Urology;  Laterality: Left;  . TRANSTHORACIC ECHOCARDIOGRAM  04-26-2011     MILD LVH/ LVSF NORMAL/ EF 32-35%/ GRADE I DIASTOLIC DYSFUNCTION/ MILD MITRAL REGURG.  . TRANSURETHRAL RESECTION OF PROSTATE  SEVERAL YRS AGO    Review of Systems     Objective:   Physical Exam  BP (!) 144/70   Pulse 92   Temp (!) 97.5 F (36.4 C)   Resp 18   Ht 6' 4.75" (1.949 m)   Wt 284 lb 9.6 oz (129.1 kg)   SpO2 93%   BMI 33.97 kg/m  No Stridor. Dry hacking cough. No Rash, cyanosis.   HEENT - WNL. Neck - supple.  Chest - Scattered rales & few end expiratory post-tussive wheezes clearing w/cough Cor - Nl HS. RRR w/o sig m. PP 1(+). No edema. MS- FROM w/o deformities.  Gait Nl. Neuro -  Nl w/o focal abnormalities.    Assessment & Plan:   1. Mild intermittent asthmatic bronchitis with acute exacerbation  - Advised to complete current meds and also given sx's Bevespi 2 puffs bid and given a spacer & instructed in use.

## 2017-06-08 ENCOUNTER — Ambulatory Visit (INDEPENDENT_AMBULATORY_CARE_PROVIDER_SITE_OTHER): Payer: PPO | Admitting: Physician Assistant

## 2017-06-08 ENCOUNTER — Encounter: Payer: Self-pay | Admitting: Physician Assistant

## 2017-06-08 VITALS — BP 138/70 | HR 100 | Temp 97.9°F | Resp 16 | Ht 76.75 in | Wt 276.8 lb

## 2017-06-08 DIAGNOSIS — Z79899 Other long term (current) drug therapy: Secondary | ICD-10-CM

## 2017-06-08 DIAGNOSIS — N401 Enlarged prostate with lower urinary tract symptoms: Secondary | ICD-10-CM

## 2017-06-08 DIAGNOSIS — M150 Primary generalized (osteo)arthritis: Secondary | ICD-10-CM | POA: Diagnosis not present

## 2017-06-08 DIAGNOSIS — Z0000 Encounter for general adult medical examination without abnormal findings: Secondary | ICD-10-CM

## 2017-06-08 DIAGNOSIS — E669 Obesity, unspecified: Secondary | ICD-10-CM | POA: Diagnosis not present

## 2017-06-08 DIAGNOSIS — E782 Mixed hyperlipidemia: Secondary | ICD-10-CM

## 2017-06-08 DIAGNOSIS — K589 Irritable bowel syndrome without diarrhea: Secondary | ICD-10-CM

## 2017-06-08 DIAGNOSIS — J45909 Unspecified asthma, uncomplicated: Secondary | ICD-10-CM | POA: Diagnosis not present

## 2017-06-08 DIAGNOSIS — R6889 Other general symptoms and signs: Secondary | ICD-10-CM | POA: Diagnosis not present

## 2017-06-08 DIAGNOSIS — K219 Gastro-esophageal reflux disease without esophagitis: Secondary | ICD-10-CM

## 2017-06-08 DIAGNOSIS — I10 Essential (primary) hypertension: Secondary | ICD-10-CM | POA: Diagnosis not present

## 2017-06-08 DIAGNOSIS — M0579 Rheumatoid arthritis with rheumatoid factor of multiple sites without organ or systems involvement: Secondary | ICD-10-CM

## 2017-06-08 DIAGNOSIS — J449 Chronic obstructive pulmonary disease, unspecified: Secondary | ICD-10-CM

## 2017-06-08 DIAGNOSIS — M159 Polyosteoarthritis, unspecified: Secondary | ICD-10-CM

## 2017-06-08 DIAGNOSIS — Z0001 Encounter for general adult medical examination with abnormal findings: Secondary | ICD-10-CM | POA: Diagnosis not present

## 2017-06-08 DIAGNOSIS — E559 Vitamin D deficiency, unspecified: Secondary | ICD-10-CM | POA: Diagnosis not present

## 2017-06-08 DIAGNOSIS — J441 Chronic obstructive pulmonary disease with (acute) exacerbation: Secondary | ICD-10-CM

## 2017-06-08 DIAGNOSIS — E66811 Obesity, class 1: Secondary | ICD-10-CM

## 2017-06-08 MED ORDER — BENZONATATE 200 MG PO CAPS
ORAL_CAPSULE | ORAL | 1 refills | Status: DC
Start: 2017-06-08 — End: 2017-07-09

## 2017-06-08 MED ORDER — ALBUTEROL SULFATE 108 (90 BASE) MCG/ACT IN AEPB: 108.0000 ug | INHALATION_SPRAY | Freq: Four times a day (QID) | RESPIRATORY_TRACT | 3 refills | Status: DC | PRN

## 2017-06-08 MED ORDER — PREDNISONE 20 MG PO TABS: ORAL_TABLET | ORAL | 0 refills | Status: AC

## 2017-06-08 MED ORDER — IPRATROPIUM-ALBUTEROL 0.5-2.5 (3) MG/3ML IN SOLN
3.0000 mL | Freq: Once | RESPIRATORY_TRACT | Status: AC
  Administered 2017-06-08: 3 mL via RESPIRATORY_TRACT

## 2017-06-08 MED ORDER — FLUTICASONE FUROATE-VILANTEROL 100-25 MCG/INH IN AEPB: 1.0000 | INHALATION_SPRAY | Freq: Every day | RESPIRATORY_TRACT | 0 refills | Status: DC

## 2017-06-08 MED ORDER — AZITHROMYCIN 250 MG PO TABS: ORAL_TABLET | ORAL | 1 refills | Status: AC

## 2017-06-08 NOTE — Progress Notes (Signed)
MEDICARE ANNUAL WELLNESS VISIT AND FOLLOW UP Assessment:   COPD exacerbation ([place]) Will do prolonged prednisone, trilogy samples given, zpak IF NOT BETTER WILL GET CT LUNG Go to the ER if any chest pain, shortness of breath, nausea, dizziness, severe HA, changes vision/speech -     azithromycin (ZITHROMAX) 250 MG tablet; Take 2 tablets (500 mg) on  Day 1,  followed by 1 tablet (250 mg) once daily on Days 2 through 5. -     fluticasone furoate-vilanterol (BREO ELLIPTA) 100-25 MCG/INH AEPB; Inhale 1 puff into the lungs daily. Rinse mouth with water after each use -     ipratropium-albuterol (DUONEB) 0.5-2.5 (3) MG/3ML nebulizer solution 3 mL -     predniSONE (DELTASONE) 20 MG tablet; 1 pill 3 x a day for 3 days, 1 pill 2 x a day x 3 days, 1 pill a day x 5 days with food -     Albuterol Sulfate (PROAIR RESPICLICK) 956 (90 Base) MCG/ACT AEPB; Inhale 108 mcg into the lungs every 6 (six) hours as needed (wheezing/cough/shortness of breath). -     benzonatate (TESSALON) 200 MG capsule; Take 1 perle 3 x / day to prevent cough  Essential hypertension - continue medications, DASH diet, exercise and monitor at home. Call if greater than 130/80.   Chronic obstructive pulmonary disease, unspecified COPD type ([place]) Will likely need daily inhaler  Rheumatoid arthritis involving multiple sites with positive rheumatoid factor ([place]) Continue follow up  Obesity (BMI 30.0-34.9) - follow up 3 months for progress monitoring - increase veggies, decrease carbs - long discussion about weight loss, diet, and exercise  Mixed hyperlipidemia -continue medications, check lipids, decrease fatty foods, increase activity.   Medication management Monitor  Vitamin D deficiency Continue supplement  Benign prostatic hyperplasia with lower urinary tract symptoms, symptom details unspecified Monitor symptoms  Primary osteoarthritis involving multiple joints Increase walking/exercies.   Irritable bowel syndrome,  unspecified type Increase fiber  Gastroesophageal reflux disease, esophagitis presence not specified Continue PPI/H2 blocker, diet discussed  Uncomplicated asthma, unspecified asthma severity, unspecified whether persistent Will start on daily inhaler  Encounter for Medicare annual wellness exam Declines colonoscopy 1 year follow up  Over 30 minutes of exam, counseling, chart review, and critical decision making was performed  Future Appointments  Date Time Provider [place]  07/09/2017 10:00 AM Unk Pinto, MD [facility] None     Plan:   During the course of the visit the patient was educated and counseled about appropriate screening and preventive services including:    Pneumococcal vaccine   Influenza vaccine  Prevnar 13  Td vaccine  Screening electrocardiogram  Colorectal cancer screening  Diabetes screening  Glaucoma screening  Nutrition counseling    Subjective:  [name] is a 84 y.o. male who presents for Medicare Annual Wellness Visit and acute visit for COPD.   He has had cough x 2 weeks, has had wheezing, SOB, fatigue, fast heart beats. Has had mucinex, tessalon, was treated 05/28/2017 with prednisone and levaquin. No fever, chills, chest pain, no diaphoresis, swelling in his legs. His weight is down.  BMI is Body mass index is 33.04 kg/m., he is working on diet and exercise. Wt Readings from Last 3 Encounters:  06/08/17 276 lb 12.8 oz (125.6 kg)  05/31/17 284 lb 9.6 oz (129.1 kg)  05/28/17 282 lb (127.9 kg)    His blood pressure has been controlled at home, today their BP is BP: 138/70 He does not workout. He denies chest pain, shortness  of breath, dizziness.  He is on cholesterol medication and denies myalgias. His cholesterol is at goal. The cholesterol last visit was:   Lab Results  Component Value Date   CHOL 174 06/07/2016   HDL 32 (L) 06/07/2016   LDLCALC 97 06/07/2016   TRIG 225 (H) 06/07/2016   CHOLHDL 5.4 (H)  06/07/2016   . Last A1C in the office was:  Lab Results  Component Value Date   HGBA1C 5.4 06/07/2016   Last GFR Lab Results  Component Value Date   GFRNONAA 50 (L) 06/07/2016   Patient is on Vitamin D supplement.   Lab Results  Component Value Date   VD25OH 76 06/07/2016      Medication Review: Current Outpatient Medications on File Prior to Visit  Medication Sig Dispense Refill  . budesonide-formoterol (SYMBICORT) 160-4.5 MCG/ACT inhaler Inhale 2 puffs into the lungs 2 (two) times daily. 1 Inhaler 12  . finasteride (PROSCAR) 5 MG tablet Take 1 tablet by mouth every day 90 tablet 1  . losartan (COZAAR) 100 MG tablet Take 1 tablet by mouth every morning 90 tablet 1  . Multiple Vitamin (MULTIVITAMIN) tablet Take 1 tablet by mouth daily.     Marland Kitchen terazosin (HYTRIN) 10 MG capsule Take 1 capsule by mouth every morning 90 capsule 1  . Vitamin D, Ergocalciferol, (DRISDOL) 50000 units CAPS capsule Take 1 capsule by mouth every day or as directed 90 capsule 1   No current facility-administered medications on file prior to visit.     Allergies: Allergies  Allergen Reactions  . Ace Inhibitors   . Asa [Aspirin]     High Dose asprin  . Penicillins Hives  . Vasotec [Enalapril]     Cough  . Clindamycin/Lincomycin     Hives     Current Problems (verified) has Rheumatoid arthritis (HCC); GERD (gastroesophageal reflux disease); DJD (degenerative joint disease); IBS (irritable bowel syndrome); COPD (chronic obstructive pulmonary disease) ([place]); Benign prostatic hyperplasia; Asthma; Essential hypertension; Mixed hyperlipidemia; Vitamin D deficiency; Medication management; and Obesity (BMI 30.0-34.9) on their problem list.  Screening Tests Immunization History  Administered Date(s) Administered  . DT 04/15/2014  . Influenza, High Dose Seasonal PF 04/15/2014, 02/16/2015, 02/18/2016  . Influenza-Unspecified 03/10/2013, 02/07/2017  . Pneumococcal Conjugate-13 04/15/2014  . Pneumococcal  Polysaccharide-23 05/15/2001, 03/30/2011  . Td 05/15/2002  . Zoster 04/01/2012    Preventative care: Last colonoscopy: unknown declines another Echo 2012 Stress test 2012 CT AB 2011 CT head 2013 CXR 05/29/2017 COPD with left atelectasis  Prior vaccinations: TD or Tdap: 2015  Influenza: 2018 Pneumococcal: 2012 Prevnar13: 2015 Shingles/Zostavax: 2013  Names of Other Physician/Practitioners you currently use: 1. [facility] here for primary care 2. Dr. Katy Fitch, eye doctor, last visit 1 year ago, wears glasses 3. , dentist, last visit yearly Patient Care Team: Unk Pinto, MD as PCP - General (Internal Medicine)  Surgical: He  has a past surgical history that includes Transurethral resection of prostate (SEVERAL YRS AGO); transthoracic echocardiogram (04-26-2011  ); Cardiovascular stress test (04-26-2011  DR BRACKBILL); Lumbar fusion (X3  LAST ONE 1996); and Scrotal exploration (Left, 07/17/2012). Family His family history includes Colon cancer in his father; Diabetes in his sister; Heart disease in his brother and father; Hypertension in his father. Social history  He reports that he quit smoking about 36 years ago. He has quit using smokeless tobacco. His smokeless tobacco use included chew. He reports that he does not drink alcohol or use drugs.  MEDICARE WELLNESS OBJECTIVES:  Physical activity: Current Exercise Habits: The patient does not participate in regular exercise at present Cardiac risk factors: Cardiac Risk Factors include: advanced age (>37men, >11 women);dyslipidemia;hypertension;male gender;obesity (BMI >30kg/m2);sedentary lifestyle Depression/mood screen:   Depression screen [facility] 2/9 06/08/2017  Decreased Interest 0  Down, Depressed, Hopeless 0  PHQ - 2 Score 0    ADLs:  In your present state of health, do you have any difficulty performing the following activities: 06/08/2017 06/03/2017  Hearing? N N  Vision? N N   Difficulty concentrating or making decisions? N N  Walking or climbing stairs? N N  Dressing or bathing? N N  Doing errands, shopping? N N  Some recent data might be hidden     Cognitive Testing  Alert? Yes  Normal Appearance?Yes  Oriented to person? Yes  Place? Yes   Time? Yes  Recall of three objects?  Yes  Can perform simple calculations? Yes  Displays appropriate judgment?Yes  Can read the correct time from a watch face?Yes  EOL planning: Does Patient Have a Medical Advance Directive?: Yes Type of Advance Directive: Healthcare Power of Attorney, Living will   Objective:   Today's Vitals   06/08/17 1049  BP: 138/70  Pulse: 100  Resp: 16  Temp: 97.9 F (36.6 C)  SpO2: 95%  Weight: 276 lb 12.8 oz (125.6 kg)  Height: 6' 4.75" (1.949 m)   Body mass index is 33.04 kg/m.  General appearance: alert, no distress, WD/WN, male HEENT: normocephalic, sclerae anicteric, TMs pearly, nares patent, no discharge or erythema, pharynx normal Oral cavity: MMM, no lesions Neck: supple, no lymphadenopathy, no thyromegaly, no masses Heart: RRR, normal S1, S2, no murmurs Lungs: + diffuse wheezes without rhonchi or rales Abdomen: +bs, soft, obese, non tender, non distended, no masses, no hepatomegaly, no splenomegaly Musculoskeletal: nontender, no swelling, no obvious deformity Extremities: no edema, no cyanosis, no clubbing Pulses: 2+ symmetric, upper and lower extremities, normal cap refill Neurological: alert, oriented x 3, CN2-12 intact, strength normal upper extremities and lower extremities, sensation normal throughout, DTRs 2+ throughout, no cerebellar signs, gait normal Psychiatric: normal affect, behavior normal, pleasant   Medicare Attestation I have personally reviewed: The patient's medical and social history Their use of alcohol, tobacco or illicit drugs Their current medications and supplements The patient's functional ability including ADLs,fall risks, home safety  risks, cognitive, and hearing and visual impairment Diet and physical activities Evidence for depression or mood disorders  The patient's weight, height, BMI, and visual acuity have been recorded in the chart.  I have made referrals, counseling, and provided education to the patient based on review of the above and I have provided the patient with a written personalized care plan for preventive services.     Vicie Mutters, PA-C   06/08/2017

## 2017-06-08 NOTE — Patient Instructions (Signed)
Can do steroid inhaler, NEED TO DO DAILY, this is NOT a rescue inhaler so if you are acutely short of breath please use your albuterol or call 911.  Do 1 puff once a day.  Do before you brush your teeth OR wash your mouth afterwards.  IF YOU DO NOT [place] YOUR MOUTH OUT IT CAN CAUSE YEAST Can do 2 tsp vinegar with water and switch to help prevent yeast or help yeast in your mouth.   Go to the ER if any chest pain, shortness of breath, nausea, dizziness, severe HA, changes vision/speech  CAN TAKE PROAIR OR ALBUTEROL AS NEEDED FOR COUGHING/WHEEZING BUT DO IT TWICE A DAY FOR A WEEK AND THEN AS NEEDED   Chronic Obstructive Pulmonary Disease Exacerbation Chronic obstructive pulmonary disease (COPD) is a long-term (chronic) condition that affects the lungs. COPD is a general term that can be used to describe many different lung problems that cause lung swelling (inflammation) and limit airflow, including chronic bronchitis and emphysema. COPD exacerbations are episodes when breathing symptoms become much worse and require extra treatment. COPD exacerbations are usually caused by infections. Without treatment, COPD exacerbations can be severe and even life threatening. Frequent COPD exacerbations can cause further damage to the lungs. What are the causes? This condition may be caused by:  Respiratory infections, including viral and bacterial infections.  Exposure to smoke.  Exposure to air pollution, chemical fumes, or dust.  Things that give you an allergic reaction (allergens).  Not taking your usual COPD medicines as directed.  Underlying medical problems, such as congestive heart failure or infections not involving the lungs.  In many cases, the cause (trigger) of this condition is not known. What increases the risk? The following factors may make you more likely to develop this condition:  Smoking cigarettes.  Old age.  Frequent prior COPD exacerbations.  What are the signs or  symptoms? Symptoms of this condition include:  Increased coughing.  Increased production of mucus from your lungs (sputum).  Increased wheezing.  Increased shortness of breath.  Rapid or labored breathing.  Chest tightness.  Less energy than usual.  Sleep disruption from symptoms.  Confusion or increased sleepiness.  Often these symptoms happen or get worse even with the use of medicines. How is this diagnosed? This condition is diagnosed based on:  Your medical history.  A physical exam.  You may also have tests, including:  A chest X-ray.  Blood tests.  Lung (pulmonary) function tests.  How is this treated? Treatment for this condition depends on the severity and cause of the symptoms. You may need to be admitted to a hospital for treatment. Some of the treatments commonly used to treat COPD exacerbations are:  Antibiotic medicines. These may be used for severe exacerbations caused by a lung infection, such as pneumonia.  Bronchodilators. These are inhaled medicines that expand the air passages and allow increased airflow.  Steroid medicines. These act to reduce inflammation in the airways. They may be given with an inhaler, taken by mouth, or given through an IV tube inserted into one of your veins.  Supplemental oxygen therapy.  Airway clearing techniques, such as noninvasive ventilation (NIV) and positive expiratory pressure (PEP). These provide respiratory support through a mask or other noninvasive device. An example of this would be using a continuous positive airway pressure (CPAP) machine to improve delivery of oxygen into your lungs.  Follow these instructions at home: Medicines  Take over-the-counter and prescription medicines only as told by your health  care provider. It is important to use correct technique with inhaled medicines.  If you were prescribed an antibiotic medicine or oral steroid, take it as told by your health care provider. Do not  stop taking the medicine even if you start to feel better. Lifestyle  Eat a healthy diet.  Exercise regularly.  Get plenty of sleep.  Avoid exposure to all substances that irritate the airway, especially to tobacco smoke.  Wash your hands often with soap and water to reduce the risk of infection. If soap and water are not available, use hand sanitizer.  During flu season, avoid enclosed spaces that are crowded with people. General instructions  Drink enough fluid to keep your urine clear or pale yellow (unless you have a medical condition that requires fluid restriction).  Use a cool mist vaporizer. This humidifies the air and makes it easier for you to clear your chest when you cough.  If you have a home nebulizer and oxygen, continue to use them as told by your health care provider.  Keep all follow-up visits as told by your health care provider. This is important. How is this prevented?  Stay up-to-date on pneumococcal and influenza (flu) vaccines. A flu shot is recommended every year to help prevent exacerbations.  Do not use any products that contain nicotine or tobacco, such as cigarettes and e-cigarettes. Quitting smoking is very important in preventing COPD from getting worse and in preventing exacerbations from happening as often. If you need help quitting, ask your health care provider.  Follow all instructions for pulmonary rehabilitation after a recent exacerbation. This can help prevent future exacerbations.  Work with your health care provider to develop and follow an action plan. This tells you what steps to take when you experience certain symptoms. Contact a health care provider if:  You have a worsening of your regular COPD symptoms. Get help right away if:  You have worsening shortness of breath, even when resting.  You have trouble talking.  You have severe chest pain.  You cough up blood.  You have a fever.  You have weakness, vomit repeatedly, or  faint.  You feel confused.  You are not able to sleep because of your symptoms.  You have trouble doing daily activities. Summary  COPD exacerbations are episodes when breathing symptoms become much worse and require extra treatment above your normal treatment.  Exacerbations can be severe and even life threatening. Frequent COPD exacerbations can cause further damage to your lungs.  COPD exacerbations are usually triggered by infections such as the flu, colds, and even pneumonia.  Treatment for this condition depends on the severity and cause of the symptoms. You may need to be admitted to a hospital for treatment.  Quitting smoking is very important to prevent COPD from getting worse and to prevent exacerbations from happening as often. This information is not intended to replace advice given to you by your health care provider. Make sure you discuss any questions you have with your health care provider. Document Released: 02/26/2007 Document Revised: 06/05/2016 Document Reviewed: 06/05/2016 Elsevier Interactive Patient Education  [employer].

## 2017-07-09 ENCOUNTER — Ambulatory Visit (INDEPENDENT_AMBULATORY_CARE_PROVIDER_SITE_OTHER): Payer: PPO | Admitting: Internal Medicine

## 2017-07-09 ENCOUNTER — Encounter: Payer: Self-pay | Admitting: Internal Medicine

## 2017-07-09 VITALS — BP 136/80 | HR 96 | Temp 97.4°F | Resp 18 | Ht 76.25 in | Wt 277.8 lb

## 2017-07-09 DIAGNOSIS — Z0001 Encounter for general adult medical examination with abnormal findings: Secondary | ICD-10-CM

## 2017-07-09 DIAGNOSIS — R7303 Prediabetes: Secondary | ICD-10-CM | POA: Diagnosis not present

## 2017-07-09 DIAGNOSIS — E559 Vitamin D deficiency, unspecified: Secondary | ICD-10-CM

## 2017-07-09 DIAGNOSIS — R7309 Other abnormal glucose: Secondary | ICD-10-CM | POA: Diagnosis not present

## 2017-07-09 DIAGNOSIS — N401 Enlarged prostate with lower urinary tract symptoms: Secondary | ICD-10-CM

## 2017-07-09 DIAGNOSIS — I10 Essential (primary) hypertension: Secondary | ICD-10-CM

## 2017-07-09 DIAGNOSIS — E782 Mixed hyperlipidemia: Secondary | ICD-10-CM | POA: Diagnosis not present

## 2017-07-09 DIAGNOSIS — Z0000 Encounter for general adult medical examination without abnormal findings: Secondary | ICD-10-CM | POA: Diagnosis not present

## 2017-07-09 DIAGNOSIS — Z136 Encounter for screening for cardiovascular disorders: Secondary | ICD-10-CM | POA: Diagnosis not present

## 2017-07-09 DIAGNOSIS — Z87891 Personal history of nicotine dependence: Secondary | ICD-10-CM

## 2017-07-09 DIAGNOSIS — Z1212 Encounter for screening for malignant neoplasm of rectum: Secondary | ICD-10-CM

## 2017-07-09 DIAGNOSIS — Z8249 Family history of ischemic heart disease and other diseases of the circulatory system: Secondary | ICD-10-CM

## 2017-07-09 DIAGNOSIS — Z79899 Other long term (current) drug therapy: Secondary | ICD-10-CM

## 2017-07-09 DIAGNOSIS — M0579 Rheumatoid arthritis with rheumatoid factor of multiple sites without organ or systems involvement: Secondary | ICD-10-CM

## 2017-07-09 DIAGNOSIS — J449 Chronic obstructive pulmonary disease, unspecified: Secondary | ICD-10-CM

## 2017-07-09 DIAGNOSIS — Z1211 Encounter for screening for malignant neoplasm of colon: Secondary | ICD-10-CM

## 2017-07-09 MED ORDER — PREDNISONE 5 MG PO TABS: ORAL_TABLET | ORAL | 1 refills | Status: DC

## 2017-07-09 NOTE — Progress Notes (Signed)
[facility]   Unk Pinto, M.D.     Uvaldo Bristle. Silverio Lay, P.A.-C Liane Comber, [place]                [address] [place], [place] [address] Telephone [phone] Telefax [phone] Annual  Screening/Preventative Visit  & Comprehensive Evaluation & Examination     This very nice 84 y.o. MWM presents for a Screening/Preventative Visit & comprehensive evaluation and management of multiple medical co-morbidities.  Patient has been followed for HTN, HLD, Prediabetes and Vitamin D Deficiency. Patient has been followed for Rheumatoid Arthritis since 2010 by Dr Berna Bue. 2010. Patient also has long hx/o asthma for 30-35 + years.      HTN predates circa 1999. Patient's BP has been controlled at home.  Today's BP is at goal -  136/80. Patient denies any cardiac symptoms as chest pain, palpitations, shortness of breath, dizziness or ankle swelling.     Patient's hyperlipidemia is controlled with diet and medications. Patient denies myalgias or other medication SE's. Last lipids were at goal albeit elevated Trig's:  Lab Results  Component Value Date   CHOL 187 07/09/2017   HDL 32 (L) 07/09/2017   LDLCALC 97 06/07/2016   TRIG 214 (H) 07/09/2017   CHOLHDL 5.8 (H) 07/09/2017      Patient has Morbid Obesity (BMI 33+) and  prediabetes (A1c 5.7%/2013)  and patient denies reactive hypoglycemic symptoms, visual blurring, diabetic polys or paresthesias. Last A1c was Normal & at goal: Lab Results  Component Value Date   HGBA1C 5.4 06/07/2016       Finally, patient has history of Vitamin D Deficiency ("25"/2008) and last vitamin D was at goal ;   Lab Results  Component Value Date   VD25OH 76 06/07/2016   Current Outpatient Medications on File Prior to Visit  Medication Sig  . Albuterol Sulfate (PROAIR RESPICLICK) 952 (90 Base) MCG/ACT AEPB Inhale 108 mcg into the lungs every 6 (six) hours as needed  (wheezing/cough/shortness of breath).  . budesonide-formoterol (SYMBICORT) 160-4.5 MCG/ACT inhaler Inhale 2 puffs into the lungs 2 (two) times daily.  . finasteride (PROSCAR) 5 MG tablet Take 1 tablet by mouth every day  . fluticasone furoate-vilanterol (BREO ELLIPTA) 100-25 MCG/INH AEPB Inhale 1 puff into the lungs daily. Rinse mouth with water after each use  . losartan (COZAAR) 100 MG tablet Take 1 tablet by mouth every morning  . Multiple Vitamin (MULTIVITAMIN) tablet Take 1 tablet by mouth daily.   Marland Kitchen terazosin (HYTRIN) 10 MG capsule Take 1 capsule by mouth every morning  . Vitamin D, Ergocalciferol, (DRISDOL) 50000 units CAPS capsule Take 1 capsule by mouth every day or as directed   No current facility-administered medications on file prior to visit.    Allergies  Allergen Reactions  . Ace Inhibitors   . Asa [Aspirin]     High Dose asprin  . Penicillins Hives  . Vasotec [Enalapril]     Cough  . Clindamycin/Lincomycin     Hives    Past Medical History:  Diagnosis Date  . Allergy   . Asthma   . BPH (benign prostatic hypertrophy)   . BPH (benign prostatic hypertrophy)   . COPD (chronic obstructive pulmonary disease) ([place])   . DJD (degenerative joint disease)   . GERD (gastroesophageal reflux disease)   . History of urinary retention   . Hypertension   .  IBS (irritable bowel syndrome)   . RBBB   . Rheumatoid arthritis(714.0)    DR. HAWKES  . Scrotal lesion   . Short of breath on exertion    Health Maintenance  Topic Date Due  . TETANUS/TDAP  04/15/2024  . INFLUENZA VACCINE  Completed  . PNA vac Low Risk Adult  Completed   Immunization History  Administered Date(s) Administered  . DT 04/15/2014  . Influenza, High Dose Seasonal PF 04/15/2014, 02/16/2015, 02/18/2016  . Influenza-Unspecified 03/10/2013, 02/07/2017  . Pneumococcal Conjugate-13 04/15/2014  . Pneumococcal Polysaccharide-23 05/15/2001, 03/30/2011  . Td 05/15/2002  . Zoster 04/01/2012   Last Colon -   Past Surgical History:  Procedure Laterality Date  . CARDIOVASCULAR STRESS TEST  04-26-2011  DR BRACKBILL   LOW RISK STRESS NUCLEAR STUDY/ NO EVIDENCE ISCHEMIA  . LUMBAR FUSION  X3  LAST ONE 1996  . SCROTAL EXPLORATION Left 07/17/2012   Procedure:  EXCISION  OF LEFT SCROTAL SKIN LESION;  Surgeon: Molli Hazard, MD;  Location: [facility];  Service: Urology;  Laterality: Left;  . TRANSTHORACIC ECHOCARDIOGRAM  04-26-2011     MILD LVH/ LVSF NORMAL/ EF 09-81%/ GRADE I DIASTOLIC DYSFUNCTION/ MILD MITRAL REGURG.  . TRANSURETHRAL RESECTION OF PROSTATE  SEVERAL YRS AGO   Family History  Problem Relation Age of Onset  . Heart disease Father   . Colon cancer Father   . Hypertension Father   . Diabetes Sister   . Heart disease Brother    Social History   Socioeconomic History  . Marital status: Married    Spouse name: Remo Lipps  . Number of children: 2 daughters & 5 GC  Occupational History  . Occupation: RETIRED    Employer: RETIRED  Tobacco Use  . Smoking status: Former Smoker    Last attempt to quit: 07/11/1980    Years since quitting: 37.0  . Smokeless tobacco: Former Systems developer    Types: Chew  Substance and Sexual Activity  . Alcohol use: No  . Drug use: No  . Sexual activity: Not on file    ROS Constitutional: Denies fever, chills, weight loss/gain, headaches, insomnia,  night sweats or change in appetite. Does c/o fatigue. Eyes: Denies redness, blurred vision, diplopia, discharge, itchy or watery eyes.  ENT: Denies discharge, congestion, post nasal drip, epistaxis, sore throat, earache, hearing loss, dental pain, Tinnitus, Vertigo, Sinus pain or snoring.  Cardio: Denies chest pain, palpitations, irregular heartbeat, syncope, dyspnea, diaphoresis, orthopnea, PND, claudication or edema Respiratory: denies cough, dyspnea, DOE, pleurisy, hoarseness, laryngitis or wheezing.  Gastrointestinal: Denies dysphagia, heartburn, reflux, water brash, pain, cramps, nausea,  vomiting, bloating, diarrhea, constipation, hematemesis, melena, hematochezia, jaundice or hemorrhoids Genitourinary: Denies dysuria, frequency, urgency, nocturia, hesitancy, discharge, hematuria or flank pain Musculoskeletal: Denies arthralgia, myalgia, stiffness, Jt. Swelling, pain, limp or strain/sprain. Denies Falls. Skin: Denies puritis, rash, hives, warts, acne, eczema or change in skin lesion Neuro: No weakness, tremor, incoordination, spasms, paresthesia or pain Psychiatric: Denies confusion, memory loss or sensory loss. Denies Depression. Endocrine: Denies change in weight, skin, hair change, nocturia, and paresthesia, diabetic polys, visual blurring or hyper / hypo glycemic episodes.  Heme/Lymph: No excessive bleeding, bruising or enlarged lymph nodes.  Physical Exam  BP 136/80   Pulse 96   Temp (!) 97.4 F (36.3 C)   Resp 18   Ht 6' 4.25" (1.937 m)   Wt 277 lb 12.8 oz (126 kg)   BMI 33.59 kg/m   General Appearance:  Over nourished and well groomed and in no  apparent distress.  Eyes: PERRLA, EOMs, conjunctiva no swelling or erythema, normal fundi and vessels. Sinuses: No frontal/maxillary tenderness ENT/Mouth: EACs patent / TMs  nl. Nares clear without erythema, swelling, mucoid exudates. Oral hygiene is good. No erythema, swelling, or exudate. Tongue normal, non-obstructing. Tonsils not swollen or erythematous. Hearing normal.  Neck: Supple, thyroid not palpable. No bruits, nodes or JVD. Respiratory: Respiratory effort normal.  BS equal and clear bilateral without rales, rhonci, wheezing or stridor. Cardio: Heart sounds are normal with regular rate and rhythm and no murmurs, rubs or gallops. Peripheral pulses are normal and equal bilaterally without edema. No aortic or femoral bruits. Chest: symmetric with normal excursions and percussion.  Abdomen: Soft, with Nl bowel sounds. Nontender, no guarding, rebound, hernias, masses, or organomegaly.  Lymphatics: Non tender without  lymphadenopathy.  Genitourinary: No hernias.Testes nl. DRE - prostate nl for age - smooth & firm w/o nodules. Musculoskeletal: Full ROM all peripheral extremities, joint stability, 5/5 strength, and normal gait. Skin: Warm and dry without rashes, lesions, cyanosis, clubbing or  ecchymosis.  Neuro: Cranial nerves intact, reflexes equal bilaterally. Normal muscle tone, no cerebellar symptoms. Sensation intact.  Pysch: Alert and oriented X 3 with normal affect, insight and judgment appropriate.   Assessment and Plan  1. Annual Preventative/Screening Exam   2. Essential hypertension  - EKG 12-Lead - [place], RETROPERITNL ABD,  LTD - Urinalysis, Routine w reflex microscopic - Microalbumin / creatinine urine ratio - CBC with Differential/Platelet - BASIC METABOLIC PANEL WITH GFR - Magnesium - TSH  3. Hyperlipidemia, mixed  - EKG 12-Lead - [place], RETROPERITNL ABD,  LTD - Hepatic function panel - Lipid panel - TSH  4. Abnormal glucose  - EKG 12-Lead - [place], RETROPERITNL ABD,  LTD - Hemoglobin A1c - Insulin, random  5. Vitamin D deficiency  - VITAMIN D 25 Hydroxyl  6. Prediabetes  - EKG 12-Lead - [place], RETROPERITNL ABD,  LTD - Hemoglobin A1c - Insulin, random  7. Chronic obstructive pulmonary disease (HCC)  - predniSONE (DELTASONE) 5 MG tablet; Take 1 tablet 3 x / day or as directed for Asthma & Rheumatoid Arthritis  Dispense: 270 tablet; Refill: 1  8. Rheumatoid arthritis involving multiple sites with positive rheumatoid factor (HCC)  - predniSONE (DELTASONE) 5 MG tablet; Take 1 tablet 3 x / day or as directed for Asthma & Rheumatoid Arthritis  Dispense: 270 tablet; Refill: 1  9. Screening for colorectal cancer  - POC Hemoccult Bld/Stl   10. Benign localized prostatic hyperplasia with lower urinary tract symptoms (LUTS)  - PSA  11. Screening for ischemic heart disease  - EKG 12-Lead  12. Screening for AAA (aortic abdominal aneurysm)  - [place], RETROPERITNL ABD,   LTD  13. FHx: heart disease  - EKG 12-Lead - [place], RETROPERITNL ABD,  LTD  14. Former smoker  - EKG 12-Lead - [place], RETROPERITNL ABD,  LTD  15. Medication management  - Urinalysis, Routine w reflex microscopic - Microalbumin / creatinine urine ratio - CBC with Differential/Platelet - BASIC METABOLIC PANEL WITH GFR - Hepatic function panel - Magnesium - Lipid panel - TSH - Hemoglobin A1c - Insulin, random - VITAMIN D 25 Hydroxy        Patient was counseled in prudent diet, weight control to achieve/maintain BMI less than 25, BP monitoring, regular exercise and medications as discussed.  Discussed med effects and SE's. Routine screening labs and tests as requested with regular follow-up as recommended. Over 40 minutes of exam, counseling, chart review and high complex critical  decision making was performed

## 2017-07-09 NOTE — Patient Instructions (Addendum)
Preventive Care for Adults  A healthy lifestyle and preventive care can promote health and wellness. Preventive health guidelines for men include the following key practices:  A routine yearly physical is a good way to check with your health care provider about your health and preventative screening. It is a chance to share any concerns and updates on your health and to receive a thorough exam.  Visit your dentist for a routine exam and preventative care every 6 months. Brush your teeth twice a day and floss once a day. Good oral hygiene prevents tooth decay and gum disease.  The frequency of eye exams is based on your age, health, family medical history, use of contact lenses, and other factors. Follow your health care provider's recommendations for frequency of eye exams.  Eat a healthy diet. Foods such as vegetables, fruits, whole grains, low-fat dairy products, and lean protein foods contain the nutrients you need without too many calories. Decrease your intake of foods high in solid fats, added sugars, and salt. Eat the right amount of calories for you.Get information about a proper diet from your health care provider, if necessary.  Regular physical exercise is one of the most important things you can do for your health. Most adults should get at least 150 minutes of moderate-intensity exercise (any activity that increases your heart rate and causes you to sweat) each week. In addition, most adults need muscle-strengthening exercises on 2 or more days a week.  Maintain a healthy weight. The body mass index (BMI) is a screening tool to identify possible weight problems. It provides an estimate of body fat based on height and weight. Your health care provider can find your BMI and can help you achieve or maintain a healthy weight.For adults 20 years and older:  A BMI below 18.5 is considered underweight.  A BMI of 18.5 to 24.9 is normal.  A BMI of 25 to 29.9 is considered overweight.  A  BMI of 30 and above is considered obese.  Maintain normal blood lipids and cholesterol levels by exercising and minimizing your intake of saturated fat. Eat a balanced diet with plenty of fruit and vegetables. Blood tests for lipids and cholesterol should begin at age 20 and be repeated every 5 years. If your lipid or cholesterol levels are high, you are over 50, or you are at high risk for heart disease, you may need your cholesterol levels checked more frequently.Ongoing high lipid and cholesterol levels should be treated with medicines if diet and exercise are not working.  If you smoke, find out from your health care provider how to quit. If you do not use tobacco, do not start.  Lung cancer screening is recommended for adults aged 55-80 years who are at high risk for developing lung cancer because of a history of smoking. A yearly low-dose CT scan of the lungs is recommended for people who have at least a 30-pack-year history of smoking and are a current smoker or have quit within the past 15 years. A pack year of smoking is smoking an average of 1 pack of cigarettes a day for 1 year (for example: 1 pack a day for 30 years or 2 packs a day for 15 years). Yearly screening should continue until the smoker has stopped smoking for at least 15 years. Yearly screening should be stopped for people who develop a health problem that would prevent them from having lung cancer treatment.  If you choose to drink alcohol, do not have more   than 2 drinks per day. One drink is considered to be 12 ounces (355 mL) of beer, 5 ounces (148 mL) of wine, or 1.5 ounces (44 mL) of liquor.  Avoid use of street drugs. Do not share needles with anyone. Ask for help if you need support or instructions about stopping the use of drugs.  High blood pressure causes heart disease and increases the risk of stroke. Your blood pressure should be checked at least every 1-2 years. Ongoing high blood pressure should be treated with  medicines, if weight loss and exercise are not effective.  If you are 45-79 years old, ask your health care provider if you should take aspirin to prevent heart disease.  Diabetes screening involves taking a blood sample to check your fasting blood sugar level. Testing should be considered at a younger age or be carried out more frequently if you are overweight and have at least 1 risk factor for diabetes.  Colorectal cancer can be detected and often prevented. Most routine colorectal cancer screening begins at the age of 50 and continues through age 75. However, your health care provider may recommend screening at an earlier age if you have risk factors for colon cancer. On a yearly basis, your health care provider may provide home test kits to check for hidden blood in the stool. Use of a small camera at the end of a tube to directly examine the colon (sigmoidoscopy or colonoscopy) can detect the earliest forms of colorectal cancer. Talk to your health care provider about this at age 50, when routine screening begins. Direct exam of the colon should be repeated every 5-10 years through age 75, unless early forms of precancerous polyps or small growths are found.  Hepatitis C blood testing is recommended for all people born from 1945 through 1965 and any individual with known risks for hepatitis C.  Screening for abdominal aortic aneurysm (AAA)  by ultrasound is recommended for people who have history of high blood pressure or who are current or former smokers.  Healthy men should  receive prostate-specific antigen (PSA) blood tests as part of routine cancer screening. Talk with your health care provider about prostate cancer screening.  Testicular cancer screening is  recommended for adult males. Screening includes self-exam, a health care provider exam, and other screening tests. Consult with your health care provider about any symptoms you have or any concerns you have about testicular  cancer.  Use sunscreen. Apply sunscreen liberally and repeatedly throughout the day. You should seek shade when your shadow is shorter than you. Protect yourself by wearing long sleeves, pants, a wide-brimmed hat, and sunglasses year round, whenever you are outdoors.  Once a month, do a whole-body skin exam, using a mirror to look at the skin on your back. Tell your health care provider about new moles, moles that have irregular borders, moles that are larger than a pencil eraser, or moles that have changed in shape or color.  Stay current with required vaccines (immunizations).  Influenza vaccine. All adults should be immunized every year.  Tetanus, diphtheria, and acellular pertussis (Td, Tdap) vaccine. An adult who has not previously received Tdap or who does not know his vaccine status should receive 1 dose of Tdap. This initial dose should be followed by tetanus and diphtheria toxoids (Td) booster doses every 10 years. Adults with an unknown or incomplete history of completing a 3-dose immunization series with Td-containing vaccines should begin or complete a primary immunization series including a Tdap dose. Adults   should receive a Td booster every 10 years.  Zoster vaccine. One dose is recommended for adults aged 60 years or older unless certain conditions are present.    PREVNAR - Pneumococcal 13-valent conjugate (PCV13) vaccine. When indicated, a person who is uncertain of his immunization history and has no record of immunization should receive the PCV13 vaccine. An adult aged 19 years or older who has certain medical conditions and has not been previously immunized should receive 1 dose of PCV13 vaccine. This PCV13 should be followed with a dose of pneumococcal polysaccharide (PPSV23) vaccine. The PPSV23 vaccine dose should be obtained at least 8 weeks after the dose of PCV13 vaccine. An adult aged 19 years or older who has certain medical conditions and previously received 1 or more doses  of PPSV23 vaccine should receive 1 dose of PCV13. The PCV13 vaccine dose should be obtained 1 or more years after the last PPSV23 vaccine dose.    PNEUMOVAX - Pneumococcal polysaccharide (PPSV23) vaccine. When PCV13 is also indicated, PCV13 should be obtained first. All adults aged 65 years and older should be immunized. An adult younger than age 65 years who has certain medical conditions should be immunized. Any person who resides in a nursing home or long-term care facility should be immunized. An adult smoker should be immunized. People with an immunocompromised condition and certain other conditions should receive both PCV13 and PPSV23 vaccines. People with human immunodeficiency virus (HIV) infection should be immunized as soon as possible after diagnosis. Immunization during chemotherapy or radiation therapy should be avoided. Routine use of PPSV23 vaccine is not recommended for American Indians, [place] Natives, or people younger than 65 years unless there are medical conditions that require PPSV23 vaccine. When indicated, people who have unknown immunization and have no record of immunization should receive PPSV23 vaccine. One-time revaccination 5 years after the first dose of PPSV23 is recommended for people aged 19-64 years who have chronic kidney failure, nephrotic syndrome, asplenia, or immunocompromised conditions. People who received 1-2 doses of PPSV23 before age 65 years should receive another dose of PPSV23 vaccine at age 65 years or later if at least 5 years have passed since the previous dose. Doses of PPSV23 are not needed for people immunized with PPSV23 at or after age 65 years.    Hepatitis A vaccine. Adults who wish to be protected from this disease, have certain high-risk conditions, work with hepatitis A-infected animals, work in hepatitis A research labs, or travel to or work in countries with a high rate of hepatitis A should be immunized. Adults who were previously unvaccinated  and who anticipate close contact with an international adoptee during the first 60 days after arrival in the [place] from a country with a high rate of hepatitis A should be immunized.    Hepatitis B vaccine. Adults should be immunized if they wish to be protected from this disease, have certain high-risk conditions, may be exposed to blood or other infectious body fluids, are household contacts or sex partners of hepatitis B positive people, are clients or workers in certain care facilities, or travel to or work in countries with a high rate of hepatitis B.   Preventive Service / Frequency   Ages 65 and over  Blood pressure check.  Lipid and cholesterol check.  Lung cancer screening. / Every year if you are aged 55-80 years and have a 30-pack-year history of smoking and currently smoke or have quit within the past 15 years. Yearly screening is stopped once   you have quit smoking for at least 15 years or develop a health problem that would prevent you from having lung cancer treatment.  Fecal occult blood test (FOBT) of stool. You may not have to do this test if you get a colonoscopy every 10 years.  Flexible sigmoidoscopy** or colonoscopy.** / Every 5 years for a flexible sigmoidoscopy or every 10 years for a colonoscopy beginning at age 50 and continuing until age 75.  Hepatitis C blood test.** / For all people born from 1945 through 1965 and any individual with known risks for hepatitis C.  Abdominal aortic aneurysm (AAA) screening./ Screening current or former smokers or have Hypertension.  Skin self-exam. / Monthly.  Influenza vaccine. / Every year.  Tetanus, diphtheria, and acellular pertussis (Tdap/Td) vaccine.** / 1 dose of Td every 10 years.   Zoster vaccine.** / 1 dose for adults aged 60 years or older.         Pneumococcal 13-valent conjugate (PCV13) vaccine.    Pneumococcal polysaccharide (PPSV23) vaccine.     Hepatitis A vaccine.** / Consult your health  care provider.  Hepatitis B vaccine.** / Consult your health care provider. Screening for abdominal aortic aneurysm (AAA)  by ultrasound is recommended for people who have history of high blood pressure or who are current or former smokers. ++++++++++ Recommend Adult Low Dose Aspirin or  coated  Aspirin 81 mg daily  To reduce risk of Colon Cancer 20 %,  Skin Cancer 26 % ,  Melanoma 46%  and  Pancreatic cancer 60% ++++++++++++++++++++++ Vitamin D goal  is between 70-100.  Please make sure that you are taking your Vitamin D as directed.  It is very important as a natural anti-inflammatory  helping hair, skin, and nails, as well as reducing stroke and heart attack risk.  It helps your bones and helps with mood. It also decreases numerous cancer risks so please take it as directed.  Low Vit D is associated with a 200-300% higher risk for CANCER  and 200-300% higher risk for HEART   ATTACK  &  STROKE.   ...................................... It is also associated with higher death rate at younger ages,  autoimmune diseases like Rheumatoid arthritis, Lupus, Multiple Sclerosis.    Also many other serious conditions, like depression, Alzheimer's Dementia, infertility, muscle aches, fatigue, fibromyalgia - just to name a few. ++++++++++++++++++++++ Recommend the book "The END of DIETING" by Dr Joel Fuhrman  & the book "The END of DIABETES " by Dr Joel Fuhrman At [employer] - get book & Audio CD's    Being diabetic has a  300% increased risk for heart attack, stroke, cancer, and alzheimer- type vascular dementia. It is very important that you work harder with diet by avoiding all foods that are white. Avoid white rice (brown & wild rice is OK), white potatoes (sweetpotatoes in moderation is OK), White bread or wheat bread or anything made out of white flour like bagels, donuts, rolls, buns, biscuits, cakes, pastries, cookies, pizza crust, and pasta (made from white flour & egg whites) -  vegetarian pasta or spinach or wheat pasta is OK. Multigrain breads like Arnold's or [employer], or multigrain sandwich thins or flatbreads.  Diet, exercise and weight loss can reverse and cure diabetes in the early stages.  Diet, exercise and weight loss is very important in the control and prevention of complications of diabetes which affects every system in your body, ie. Brain - dementia/stroke, eyes - glaucoma/blindness, heart - heart attack/heart failure, kidneys - dialysis,   stomach - gastric paralysis, intestines - malabsorption, nerves - severe painful neuritis, circulation - gangrene & loss of a leg(s), and finally cancer and Alzheimers.    I recommend avoid fried & greasy foods,  sweets/candy, white rice (brown or wild rice or Quinoa is OK), white potatoes (sweet potatoes are OK) - anything made from white flour - bagels, doughnuts, rolls, buns, biscuits,white and wheat breads, pizza crust and traditional pasta made of white flour & egg white(vegetarian pasta or spinach or wheat pasta is OK).  Multi-grain bread is OK - like multi-grain flat bread or sandwich thins. Avoid alcohol in excess. Exercise is also important.    Eat all the vegetables you want - avoid meat, especially red meat and dairy - especially cheese.  Cheese is the most concentrated form of trans-fats which is the worst thing to clog up our arteries. Veggie cheese is OK which can be found in the fresh produce section at [employer] or [employer] or [employer]  ++++++++++++++++++++++ DASH Eating Plan  DASH stands for "Dietary Approaches to Stop Hypertension."   The DASH eating plan is a healthy eating plan that has been shown to reduce high blood pressure (hypertension). Additional health benefits may include reducing the risk of type 2 diabetes mellitus, heart disease, and stroke. The DASH eating plan may also help with weight loss. WHAT DO I NEED TO KNOW ABOUT THE DASH EATING PLAN? For the DASH eating plan, you will  follow these general guidelines:  Choose foods with a percent daily value for sodium of less than 5% (as listed on the food label).  Use salt-free seasonings or herbs instead of table salt or sea salt.  Check with your health care provider or pharmacist before using salt substitutes.  Eat lower-sodium products, often labeled as "lower sodium" or "no salt added."  Eat fresh foods.  Eat more vegetables, fruits, and low-fat dairy products.  Choose whole grains. Look for the word "whole" as the first word in the ingredient list.  Choose fish   Limit sweets, desserts, sugars, and sugary drinks.  Choose heart-healthy fats.  Eat veggie cheese   Eat more home-cooked food and less restaurant, buffet, and fast food.  Limit fried foods.  Cook foods using methods other than frying.  Limit canned vegetables. If you do use them, rinse them well to decrease the sodium.  When eating at a restaurant, ask that your food be prepared with less salt, or no salt if possible.                      WHAT FOODS CAN I EAT? Read Dr Joel Fuhrman's books on The End of Dieting & The End of Diabetes  Grains Whole grain or whole wheat bread. Brown rice. Whole grain or whole wheat pasta. Quinoa, bulgur, and whole grain cereals. Low-sodium cereals. Corn or whole wheat flour tortillas. Whole grain cornbread. Whole grain crackers. Low-sodium crackers.  Vegetables Fresh or frozen vegetables (raw, steamed, roasted, or grilled). Low-sodium or reduced-sodium tomato and vegetable juices. Low-sodium or reduced-sodium tomato sauce and paste. Low-sodium or reduced-sodium canned vegetables.   Fruits All fresh, canned (in natural juice), or frozen fruits.  Protein Products  All fish and seafood.  Dried beans, peas, or lentils. Unsalted nuts and seeds. Unsalted canned beans.  Dairy Low-fat dairy products, such as skim or 1% milk, 2% or reduced-fat cheeses, low-fat ricotta or cottage cheese, or plain low-fat yogurt.  Low-sodium or reduced-sodium cheeses.  Fats and Oils Tub margarines   without trans fats. Light or reduced-fat mayonnaise and salad dressings (reduced sodium). Avocado. Safflower, olive, or canola oils. Natural peanut or almond butter.  Other Unsalted popcorn and pretzels. The items listed above may not be a complete list of recommended foods or beverages. Contact your dietitian for more options.  ++++++++++++++++++++  WHAT FOODS ARE NOT RECOMMENDED? Grains/ White flour or wheat flour White bread. White pasta. White rice. Refined cornbread. Bagels and croissants. Crackers that contain trans fat.  Vegetables  Creamed or fried vegetables. Vegetables in a . Regular canned vegetables. Regular canned tomato sauce and paste. Regular tomato and vegetable juices.  Fruits Dried fruits. Canned fruit in light or heavy syrup. Fruit juice.  Meat and Other Protein Products Meat in general - RED meat & White meat.  Fatty cuts of meat. Ribs, chicken wings, all processed meats as bacon, sausage, bologna, salami, fatback, hot dogs, bratwurst and packaged luncheon meats.  Dairy Whole or 2% milk, cream, half-and-half, and cream cheese. Whole-fat or sweetened yogurt. Full-fat cheeses or blue cheese. Non-dairy creamers and whipped toppings. Processed cheese, cheese spreads, or cheese curds.  Condiments Onion and garlic salt, seasoned salt, table salt, and sea salt. Canned and packaged gravies. Worcestershire sauce. Tartar sauce. Barbecue sauce. Teriyaki sauce. Soy sauce, including reduced sodium. Steak sauce. Fish sauce. Oyster sauce. Cocktail sauce. Horseradish. Ketchup and mustard. Meat flavorings and tenderizers. Bouillon cubes. Hot sauce. Tabasco sauce. Marinades. Taco seasonings. Relishes.  Fats and Oils Butter, stick margarine, lard, shortening and bacon fat. Coconut, palm kernel, or palm oils. Regular salad dressings.  Pickles and olives. Salted popcorn and pretzels.  The items listed above may  not be a complete list of foods and beverages to avoid. ++++++++++++++++++++++++++++++  Rheumatoid Arthritis  Rheumatoid arthritis (RA) is a long-term (chronic) disease that causes inflammation in your joints. RA may start slowly. It usually affects the small joints of the hands and feet. Usually, the same joints are affected on both sides of your body. Inflammation from RA can also affect other parts of your body, including your heart, eyes, or lungs. RA is an autoimmune disease. That means that your body's defense system (immune system) mistakenly attacks healthy body tissues. There is no cure for RA, but medicines can help your symptoms and halt or slow down the progression of the disease. What are the causes? The exact cause of RA is not known. What increases the risk? This condition is more likely to develop in:  Women.  People who have a family history of RA or other autoimmune diseases.  What are the signs or symptoms? Symptoms of this condition vary from person to person. Symptoms usually start gradually. They are often worse in the morning. The first symptom may be morning stiffness that lasts longer than 30 minutes. As RA progresses, symptoms may include:  Pain, stiffness, swelling, warmth, and tenderness in joints on both sides of your body.  Loss of energy.  Loss of appetite.  Weight loss.  Low-grade fever.  Dry eyes and dry mouth.  Firm lumps (rheumatoid nodules) that grow beneath your skin in areas such as your forearm bones near your elbows and on your hands.  Changes in the appearance of joints (deformity) and loss of joint function.  Symptoms of RA often come and go. Sometimes, symptoms get worse for a period of time. These are called flares. How is this diagnosed? This condition is diagnosed based on your symptoms, medical history, and physical exam. You may have X-rays or MRI to check for  the type of joint changes that are caused by RA. You may also have blood  tests to look for:  Proteins (antibodies) that your immune system may make if you have RA. They include rheumatoid factor (RF) and anti-CCP. ? When blood tests show these proteins, you are said to have "seropositive RA." ? When blood tests do not show these proteins, you may have "seronegative RA."  Inflammation in your blood.  A low number of red blood cells (anemia).  How is this treated? The goals of treatment are to relieve pain, reduce inflammation, and slow down or stop joint damage and disability. Treatment may include:  Lifestyle changes. It is important to rest, eat a healthy diet, and exercise.  Medicines. Your health care provider may adjust your medicines every 3 months until treatment goals are reached. Common medicines include: ? Pain relievers (analgesics). ? Corticosteroids and NSAIDs to reduce inflammation. ? Disease-modifying antirheumatic drugs (DMARDs) to try to slow the course of the disease. ? Biologic response modifiers to reduce inflammation and damage.  Physical therapy and occupational therapy.  Surgery, if you have severe joint damage. Joint replacement or fusing of joints may be needed.  Your health care provider will work with you to identify the best treatment option for you based on assessment of the overall disease activity in your body. Follow these instructions at home:  Take over-the-counter and prescription medicines only as told by your health care provider.  Start an exercise program as told by your health care provider.  Rest when you are having a flare.  Return to your normal activities as told by your health care provider. Ask your health care provider what activities are safe for you.  Keep all follow-up visits as told by your health care provider. This is important. Where to find more information:  [employer]: www.rheumatology.[place]: www.arthritis.org Contact a health care provider  if:  You have a flare-up of RA symptoms.  You have a fever.  You have side effects from your medicines. Get help right away if:  You have chest pain.  You have trouble breathing.  You quickly develop a hot, painful joint that is more severe than your usual joint aches. This information is not intended to replace advice given to you by your health care provider. Make sure you discuss any questions you have with your health care provider. Document Released: 04/28/2000 Document Revised: 10/05/2015 Document Reviewed: 02/11/2015 Elsevier Interactive Patient Education  [employer].

## 2017-07-10 ENCOUNTER — Other Ambulatory Visit: Payer: Self-pay | Admitting: Internal Medicine

## 2017-07-10 DIAGNOSIS — N410 Acute prostatitis: Secondary | ICD-10-CM

## 2017-07-10 LAB — CBC WITH DIFFERENTIAL/PLATELET
BASOS PCT: 0.4 %
Basophils Absolute: 37 cells/uL (ref 0–200)
EOS PCT: 2.1 %
Eosinophils Absolute: 193 cells/uL (ref 15–500)
HEMATOCRIT: 39.6 % (ref 38.5–50.0)
HEMOGLOBIN: 13.5 g/dL (ref 13.2–17.1)
LYMPHS ABS: 2282 {cells}/uL (ref 850–3900)
MCH: 30.5 pg (ref 27.0–33.0)
MCHC: 34.1 g/dL (ref 32.0–36.0)
MCV: 89.4 fL (ref 80.0–100.0)
MPV: 9.5 fL (ref 7.5–12.5)
Monocytes Relative: 9.4 %
NEUTROS ABS: 5824 {cells}/uL (ref 1500–7800)
Neutrophils Relative %: 63.3 %
Platelets: 267 10*3/uL (ref 140–400)
RBC: 4.43 10*6/uL (ref 4.20–5.80)
RDW: 13.1 % (ref 11.0–15.0)
Total Lymphocyte: 24.8 %
WBC: 9.2 10*3/uL (ref 3.8–10.8)
WBCMIX: 865 {cells}/uL (ref 200–950)

## 2017-07-10 LAB — BASIC METABOLIC PANEL WITH GFR
BUN/Creatinine Ratio: 17 (calc) (ref 6–22)
BUN: 22 mg/dL (ref 7–25)
CALCIUM: 8.9 mg/dL (ref 8.6–10.3)
CHLORIDE: 106 mmol/L (ref 98–110)
CO2: 28 mmol/L (ref 20–32)
Creat: 1.3 mg/dL — ABNORMAL HIGH (ref 0.70–1.11)
GFR, EST AFRICAN AMERICAN: 58 mL/min/{1.73_m2} — AB (ref >=60)
GFR, EST NON AFRICAN AMERICAN: 50 mL/min/{1.73_m2} — AB (ref >=60)
Glucose, Bld: 103 mg/dL — ABNORMAL HIGH (ref 65–99)
POTASSIUM: 3.9 mmol/L (ref 3.5–5.3)
Sodium: 142 mmol/L (ref 135–146)

## 2017-07-10 LAB — LIPID PANEL
Cholesterol: 187 mg/dL (ref <200)
HDL: 32 mg/dL — AB (ref >40)
LDL CHOLESTEROL (CALC): 122 mg/dL — AB
Non-HDL Cholesterol (Calc): 155 mg/dL (calc) — ABNORMAL HIGH (ref <130)
TRIGLYCERIDES: 214 mg/dL — AB (ref <150)
Total CHOL/HDL Ratio: 5.8 (calc) — ABNORMAL HIGH (ref <5.0)

## 2017-07-10 LAB — URINALYSIS, ROUTINE W REFLEX MICROSCOPIC
BACTERIA UA: NONE SEEN /HPF
Bilirubin Urine: NEGATIVE
Glucose, UA: NEGATIVE
HGB URINE DIPSTICK: NEGATIVE
HYALINE CAST: NONE SEEN /LPF
Nitrite: POSITIVE — AB
PH: 5.5 (ref 5.0–8.0)
Specific Gravity, Urine: 1.031 (ref 1.001–1.03)

## 2017-07-10 LAB — HEPATIC FUNCTION PANEL
AG Ratio: 1.2 (calc) (ref 1.0–2.5)
ALBUMIN MSPROF: 3.6 g/dL (ref 3.6–5.1)
ALT: 22 U/L (ref 9–46)
AST: 15 U/L (ref 10–35)
Alkaline phosphatase (APISO): 77 U/L (ref 40–115)
BILIRUBIN DIRECT: 0.1 mg/dL (ref 0.0–0.2)
BILIRUBIN TOTAL: 0.6 mg/dL (ref 0.2–1.2)
GLOBULIN: 2.9 g/dL (ref 1.9–3.7)
Indirect Bilirubin: 0.5 mg/dL (calc) (ref 0.2–1.2)
Total Protein: 6.5 g/dL (ref 6.1–8.1)

## 2017-07-10 LAB — MICROALBUMIN / CREATININE URINE RATIO
CREATININE, URINE: 347 mg/dL — AB (ref 20–320)
MICROALB UR: 14.6 mg/dL
MICROALB/CREAT RATIO: 42 ug/mg{creat} — AB (ref <30)

## 2017-07-10 LAB — HEMOGLOBIN A1C
EAG (MMOL/L): 6.8 (calc)
Hgb A1c MFr Bld: 5.9 % of total Hgb — ABNORMAL HIGH (ref <5.7)
Mean Plasma Glucose: 123 (calc)

## 2017-07-10 LAB — INSULIN, RANDOM: INSULIN: 30.7 u[IU]/mL — AB (ref 2.0–19.6)

## 2017-07-10 LAB — TSH: TSH: 2.89 m[IU]/L (ref 0.40–4.50)

## 2017-07-10 LAB — VITAMIN D 25 HYDROXY (VIT D DEFICIENCY, FRACTURES): Vit D, 25-Hydroxy: 108 ng/mL — ABNORMAL HIGH (ref 30–100)

## 2017-07-10 LAB — MAGNESIUM: MAGNESIUM: 1.7 mg/dL (ref 1.5–2.5)

## 2017-07-10 LAB — PSA: PSA: 0.5 ng/mL (ref <=4.0)

## 2017-07-10 MED ORDER — CIPROFLOXACIN HCL 500 MG PO TABS
ORAL_TABLET | ORAL | 0 refills | Status: DC
Start: 2017-07-10 — End: 2018-02-04

## 2017-07-11 ENCOUNTER — Encounter: Payer: Self-pay | Admitting: *Deleted

## 2017-07-31 ENCOUNTER — Other Ambulatory Visit: Payer: Self-pay

## 2017-07-31 DIAGNOSIS — Z1211 Encounter for screening for malignant neoplasm of colon: Secondary | ICD-10-CM

## 2017-07-31 DIAGNOSIS — Z1212 Encounter for screening for malignant neoplasm of rectum: Principal | ICD-10-CM

## 2017-07-31 LAB — POC HEMOCCULT BLD/STL (HOME/3-CARD/SCREEN)
Card #2 Fecal Occult Blod, POC: NEGATIVE
FECAL OCCULT BLD: NEGATIVE
Fecal Occult Blood, POC: NEGATIVE

## 2017-08-21 ENCOUNTER — Ambulatory Visit (INDEPENDENT_AMBULATORY_CARE_PROVIDER_SITE_OTHER): Payer: PPO

## 2017-08-21 DIAGNOSIS — N410 Acute prostatitis: Secondary | ICD-10-CM

## 2017-08-21 NOTE — Progress Notes (Signed)
Pt reports for U/C & U/A. Pt reports he finished his ABX & has no other concerns or questions at this time.

## 2017-08-22 LAB — URINALYSIS, ROUTINE W REFLEX MICROSCOPIC
BILIRUBIN URINE: NEGATIVE
Bacteria, UA: NONE SEEN /HPF
GLUCOSE, UA: NEGATIVE
HYALINE CAST: NONE SEEN /LPF
Hgb urine dipstick: NEGATIVE
Ketones, ur: NEGATIVE
Nitrite: POSITIVE — AB
PROTEIN: NEGATIVE
RBC / HPF: NONE SEEN /HPF (ref 0–2)
SPECIFIC GRAVITY, URINE: 1.025 (ref 1.001–1.03)
pH: 6.5 (ref 5.0–8.0)

## 2017-08-22 LAB — URINE CULTURE
MICRO NUMBER:: 90437508
SPECIMEN QUALITY: ADEQUATE

## 2017-09-17 DIAGNOSIS — C4441 Basal cell carcinoma of skin of scalp and neck: Secondary | ICD-10-CM | POA: Diagnosis not present

## 2017-09-17 DIAGNOSIS — Z85828 Personal history of other malignant neoplasm of skin: Secondary | ICD-10-CM | POA: Diagnosis not present

## 2017-09-17 DIAGNOSIS — L820 Inflamed seborrheic keratosis: Secondary | ICD-10-CM | POA: Diagnosis not present

## 2017-09-17 DIAGNOSIS — D225 Melanocytic nevi of trunk: Secondary | ICD-10-CM | POA: Diagnosis not present

## 2017-09-17 DIAGNOSIS — C44319 Basal cell carcinoma of skin of other parts of face: Secondary | ICD-10-CM | POA: Diagnosis not present

## 2017-09-17 DIAGNOSIS — L570 Actinic keratosis: Secondary | ICD-10-CM | POA: Diagnosis not present

## 2017-09-17 DIAGNOSIS — D0462 Carcinoma in situ of skin of left upper limb, including shoulder: Secondary | ICD-10-CM | POA: Diagnosis not present

## 2017-09-17 DIAGNOSIS — D1801 Hemangioma of skin and subcutaneous tissue: Secondary | ICD-10-CM | POA: Diagnosis not present

## 2017-09-17 DIAGNOSIS — L821 Other seborrheic keratosis: Secondary | ICD-10-CM | POA: Diagnosis not present

## 2017-10-15 ENCOUNTER — Other Ambulatory Visit: Payer: Self-pay | Admitting: Internal Medicine

## 2017-10-24 DIAGNOSIS — M8580 Other specified disorders of bone density and structure, unspecified site: Secondary | ICD-10-CM | POA: Diagnosis not present

## 2017-10-24 DIAGNOSIS — Z6835 Body mass index (BMI) 35.0-35.9, adult: Secondary | ICD-10-CM | POA: Diagnosis not present

## 2017-10-24 DIAGNOSIS — M0579 Rheumatoid arthritis with rheumatoid factor of multiple sites without organ or systems involvement: Secondary | ICD-10-CM | POA: Diagnosis not present

## 2017-10-24 DIAGNOSIS — Z7952 Long term (current) use of systemic steroids: Secondary | ICD-10-CM | POA: Diagnosis not present

## 2017-10-24 DIAGNOSIS — M7072 Other bursitis of hip, left hip: Secondary | ICD-10-CM | POA: Diagnosis not present

## 2017-10-24 DIAGNOSIS — E669 Obesity, unspecified: Secondary | ICD-10-CM | POA: Diagnosis not present

## 2017-10-30 ENCOUNTER — Ambulatory Visit: Payer: Self-pay | Admitting: Adult Health

## 2018-01-18 DIAGNOSIS — C44319 Basal cell carcinoma of skin of other parts of face: Secondary | ICD-10-CM | POA: Diagnosis not present

## 2018-01-18 DIAGNOSIS — L821 Other seborrheic keratosis: Secondary | ICD-10-CM | POA: Diagnosis not present

## 2018-01-18 DIAGNOSIS — D1801 Hemangioma of skin and subcutaneous tissue: Secondary | ICD-10-CM | POA: Diagnosis not present

## 2018-01-18 DIAGNOSIS — L570 Actinic keratosis: Secondary | ICD-10-CM | POA: Diagnosis not present

## 2018-01-18 DIAGNOSIS — C4441 Basal cell carcinoma of skin of scalp and neck: Secondary | ICD-10-CM | POA: Diagnosis not present

## 2018-01-18 DIAGNOSIS — Z85828 Personal history of other malignant neoplasm of skin: Secondary | ICD-10-CM | POA: Diagnosis not present

## 2018-01-18 DIAGNOSIS — L820 Inflamed seborrheic keratosis: Secondary | ICD-10-CM | POA: Diagnosis not present

## 2018-01-18 DIAGNOSIS — D044 Carcinoma in situ of skin of scalp and neck: Secondary | ICD-10-CM | POA: Diagnosis not present

## 2018-01-18 DIAGNOSIS — D0462 Carcinoma in situ of skin of left upper limb, including shoulder: Secondary | ICD-10-CM | POA: Diagnosis not present

## 2018-02-04 ENCOUNTER — Encounter: Payer: Self-pay | Admitting: Internal Medicine

## 2018-02-04 ENCOUNTER — Ambulatory Visit (INDEPENDENT_AMBULATORY_CARE_PROVIDER_SITE_OTHER): Payer: PPO | Admitting: Internal Medicine

## 2018-02-04 VITALS — BP 126/64 | HR 68 | Temp 97.5°F | Ht 76.25 in | Wt 283.6 lb

## 2018-02-04 DIAGNOSIS — R7303 Prediabetes: Secondary | ICD-10-CM

## 2018-02-04 DIAGNOSIS — E559 Vitamin D deficiency, unspecified: Secondary | ICD-10-CM | POA: Diagnosis not present

## 2018-02-04 DIAGNOSIS — Z23 Encounter for immunization: Secondary | ICD-10-CM | POA: Diagnosis not present

## 2018-02-04 DIAGNOSIS — E782 Mixed hyperlipidemia: Secondary | ICD-10-CM | POA: Diagnosis not present

## 2018-02-04 DIAGNOSIS — I10 Essential (primary) hypertension: Secondary | ICD-10-CM | POA: Diagnosis not present

## 2018-02-04 DIAGNOSIS — J449 Chronic obstructive pulmonary disease, unspecified: Secondary | ICD-10-CM | POA: Diagnosis not present

## 2018-02-04 DIAGNOSIS — R7309 Other abnormal glucose: Secondary | ICD-10-CM

## 2018-02-04 DIAGNOSIS — M0579 Rheumatoid arthritis with rheumatoid factor of multiple sites without organ or systems involvement: Secondary | ICD-10-CM

## 2018-02-04 DIAGNOSIS — Z79899 Other long term (current) drug therapy: Secondary | ICD-10-CM

## 2018-02-04 NOTE — Progress Notes (Signed)
This very nice 84 y.o.MWM  presents for 6 month follow up with HTN, HLD, Pre-Diabetes and Vitamin D Deficiency.      Patient is treated for HTN & BP has been controlled at home. Today's  . Patient has had no complaints of any cardiac type chest pain, palpitations, dyspnea / orthopnea / PND, dizziness, claudication, or dependent edema.     Hyperlipidemia is controlled with diet & meds. Patient denies myalgias or other med SE's. Last Lipids were  Lab Results  Component Value Date   CHOL 187 07/09/2017   HDL 32 (L) 07/09/2017   LDLCALC 122 (H) 07/09/2017   TRIG 214 (H) 07/09/2017   CHOLHDL 5.8 (H) 07/09/2017      Also, the patient has history of T2_NIDDM PreDiabetes and has had no symptoms of reactive hypoglycemia, diabetic polys, paresthesias or visual blurring.  Last A1c was  Lab Results  Component Value Date   HGBA1C 5.9 (H) 07/09/2017      Further, the patient also has history of Vitamin D Deficiency and supplements vitamin D without any suspected side-effects. Last vitamin D was   Lab Results  Component Value Date   VD25OH 108 (H) 07/09/2017   Current Outpatient Medications on File Prior to Visit  Medication Sig  . Albuterol Sulfate (PROAIR RESPICLICK) 557 (90 Base) MCG/ACT AEPB Inhale 108 mcg into the lungs every 6 (six) hours as needed (wheezing/cough/shortness of breath).  . budesonide-formoterol (SYMBICORT) 160-4.5 MCG/ACT inhaler Inhale 2 puffs into the lungs 2 (two) times daily.  . ciprofloxacin (CIPRO) 500 MG tablet Take 1 tablet 2 x / day with food for 1 month for Prostate infection  . finasteride (PROSCAR) 5 MG tablet Take 1 tablet by mouth every day  . fluticasone furoate-vilanterol (BREO ELLIPTA) 100-25 MCG/INH AEPB Inhale 1 puff into the lungs daily. Rinse mouth with water after each use  . losartan (COZAAR) 100 MG tablet Take 1 tablet by mouth every morning  . Multiple Vitamin (MULTIVITAMIN) tablet Take 1 tablet by mouth daily.   . predniSONE (DELTASONE) 5 MG  tablet Take 1 tablet 3 x / day or as directed for Asthma & Rheumatoid Arthritis  . terazosin (HYTRIN) 10 MG capsule Take 1 capsule by mouth every morning  . Vitamin D, Ergocalciferol, (DRISDOL) 50000 units CAPS capsule Take 1 capsule by mouth every day or as directed   No current facility-administered medications on file prior to visit.    Allergies  Allergen Reactions  . Ace Inhibitors   . Asa [Aspirin]     High Dose asprin  . Penicillins Hives  . Vasotec [Enalapril]     Cough  . Clindamycin/Lincomycin     Hives    PMHx:   Past Medical History:  Diagnosis Date  . Allergy   . Asthma   . BPH (benign prostatic hypertrophy)   . BPH (benign prostatic hypertrophy)   . COPD (chronic obstructive pulmonary disease) ([place])   . DJD (degenerative joint disease)   . GERD (gastroesophageal reflux disease)   . History of urinary retention   . Hypertension   . IBS (irritable bowel syndrome)   . RBBB   . Rheumatoid arthritis(714.0)    DR. HAWKES  . Scrotal lesion   . Short of breath on exertion    Immunization History  Administered Date(s) Administered  . DT 04/15/2014  . Influenza, High Dose Seasonal PF 04/15/2014, 02/16/2015, 02/18/2016  . Influenza-Unspecified 03/10/2013, 02/07/2017  . Pneumococcal Conjugate-13 04/15/2014  . Pneumococcal Polysaccharide-23 05/15/2001,  03/30/2011  . Td 05/15/2002  . Zoster 04/01/2012   Past Surgical History:  Procedure Laterality Date  . CARDIOVASCULAR STRESS TEST  04-26-2011  DR BRACKBILL   LOW RISK STRESS NUCLEAR STUDY/ NO EVIDENCE ISCHEMIA  . LUMBAR FUSION  X3  LAST ONE 1996  . SCROTAL EXPLORATION Left 07/17/2012   Procedure:  EXCISION  OF LEFT SCROTAL SKIN LESION;  Surgeon: Molli Hazard, MD;  Location: [facility];  Service: Urology;  Laterality: Left;  . TRANSTHORACIC ECHOCARDIOGRAM  04-26-2011     MILD LVH/ LVSF NORMAL/ EF 28-41%/ GRADE I DIASTOLIC DYSFUNCTION/ MILD MITRAL REGURG.  . TRANSURETHRAL RESECTION OF  PROSTATE  SEVERAL YRS AGO   FHx:    Reviewed / unchanged  SHx:    Reviewed / unchanged   Systems Review:  Constitutional: Denies fever, chills, wt changes, headaches, insomnia, fatigue, night sweats, change in appetite. Eyes: Denies redness, blurred vision, diplopia, discharge, itchy, watery eyes.  ENT: Denies discharge, congestion, post nasal drip, epistaxis, sore throat, earache, hearing loss, dental pain, tinnitus, vertigo, sinus pain, snoring.  CV: Denies chest pain, palpitations, irregular heartbeat, syncope, dyspnea, diaphoresis, orthopnea, PND, claudication or edema. Respiratory: denies cough, dyspnea, DOE, pleurisy, hoarseness, laryngitis, wheezing.  Gastrointestinal: Denies dysphagia, odynophagia, heartburn, reflux, water brash, abdominal pain or cramps, nausea, vomiting, bloating, diarrhea, constipation, hematemesis, melena, hematochezia  or hemorrhoids. Genitourinary: Denies dysuria, frequency, urgency, nocturia, hesitancy, discharge, hematuria or flank pain. Musculoskeletal: Denies arthralgias, myalgias, stiffness, jt. swelling, pain, limping or strain/sprain.  Skin: Denies pruritus, rash, hives, warts, acne, eczema or change in skin lesion(s). Neuro: No weakness, tremor, incoordination, spasms, paresthesia or pain. Psychiatric: Denies confusion, memory loss or sensory loss. Endo: Denies change in weight, skin or hair change.  Heme/Lymph: No excessive bleeding, bruising or enlarged lymph nodes.  Physical Exam  There were no vitals taken for this visit.  Appears  well nourished, well groomed  and in no distress.  Eyes: PERRLA, EOMs, conjunctiva no swelling or erythema. Sinuses: No frontal/maxillary tenderness ENT/Mouth: EAC's clear, TM's nl w/o erythema, bulging. Nares clear w/o erythema, swelling, exudates. Oropharynx clear without erythema or exudates. Oral hygiene is good. Tongue normal, non obstructing. Hearing intact.  Neck: Supple. Thyroid not palpable. Car 2+/2+  without bruits, nodes or JVD. Chest: Respirations nl with BS clear & equal w/o rales, rhonchi, wheezing or stridor.  Cor: Heart sounds normal w/ regular rate and rhythm without sig. murmurs, gallops, clicks or rubs. Peripheral pulses normal and equal  without edema.  Abdomen: Soft & bowel sounds normal. Non-tender w/o guarding, rebound, hernias, masses or organomegaly.  Lymphatics: Unremarkable.  Musculoskeletal: Full ROM all peripheral extremities, joint stability, 5/5 strength and normal gait.  Skin: Warm, dry without exposed rashes, lesions or ecchymosis apparent.  Neuro: Cranial nerves intact, reflexes equal bilaterally. Sensory-motor testing grossly intact. Tendon reflexes grossly intact.  Pysch: Alert & oriented x 3.  Insight and judgement nl & appropriate. No ideations.  Assessment and Plan:  1. Essential hypertension  - Continue medication, monitor blood pressure at home.  - Continue DASH diet.  Reminder to go to the ER if any CP,  SOB, nausea, dizziness, severe HA, changes vision/speech.  - CBC with Differential/Platelet - COMPLETE METABOLIC PANEL WITH GFR - Magnesium - TSH  2. Hyperlipidemia, mixed  - Continue diet/meds, exercise,& lifestyle modifications.  - Continue monitor periodic cholesterol/liver & renal functions   - Lipid panel - TSH  3. Abnormal glucose  - Continue diet, exercise, lifestyle modifications.  - Monitor appropriate labs.  -  Hemoglobin A1c - Insulin, random  4. Vitamin D deficiency  - Continue supplementation.   - VITAMIN D 25 Hydroxyl  5. Prediabetes  - Hemoglobin A1c - Insulin, random  6. Chronic obstructive pulmonary disease, unspecified COPD type (HCC)  - COMPLETE METABOLIC PANEL WITH GFR  7. Rheumatoid arthritis involving multiple sites with positive rheumatoid factor ([place])   8. Medication management  - CBC with Differential/Platelet - COMPLETE METABOLIC PANEL WITH GFR - Magnesium - Lipid panel - TSH - Hemoglobin  A1c - Insulin, random - VITAMIN D 25 Hydroxyl  9. Need for prophylactic vaccination and inoculation against influenza  - Flu vaccine HIGH DOSE PF         Discussed  regular exercise, BP monitoring, weight control to achieve/maintain BMI less than 25 and discussed med and SE's. Recommended labs to assess and monitor clinical status with further disposition pending results of labs. Over 30 minutes of exam, counseling, chart review was performed.

## 2018-02-04 NOTE — Patient Instructions (Signed)

## 2018-02-05 LAB — CBC WITH DIFFERENTIAL/PLATELET
BASOS PCT: 0.6 %
Basophils Absolute: 44 cells/uL (ref 0–200)
EOS ABS: 44 {cells}/uL (ref 15–500)
Eosinophils Relative: 0.6 %
HEMATOCRIT: 41.7 % (ref 38.5–50.0)
Hemoglobin: 14.2 g/dL (ref 13.2–17.1)
LYMPHS ABS: 1358 {cells}/uL (ref 850–3900)
MCH: 30.4 pg (ref 27.0–33.0)
MCHC: 34.1 g/dL (ref 32.0–36.0)
MCV: 89.3 fL (ref 80.0–100.0)
MPV: 10 fL (ref 7.5–12.5)
Monocytes Relative: 5.4 %
NEUTROS ABS: 5460 {cells}/uL (ref 1500–7800)
NEUTROS PCT: 74.8 %
PLATELETS: 264 10*3/uL (ref 140–400)
RBC: 4.67 10*6/uL (ref 4.20–5.80)
RDW: 13.3 % (ref 11.0–15.0)
Total Lymphocyte: 18.6 %
WBC: 7.3 10*3/uL (ref 3.8–10.8)
WBCMIX: 394 {cells}/uL (ref 200–950)

## 2018-02-05 LAB — COMPLETE METABOLIC PANEL WITH GFR
AG RATIO: 1.4 (calc) (ref 1.0–2.5)
ALT: 17 U/L (ref 9–46)
AST: 16 U/L (ref 10–35)
Albumin: 3.7 g/dL (ref 3.6–5.1)
Alkaline phosphatase (APISO): 80 U/L (ref 40–115)
BILIRUBIN TOTAL: 0.5 mg/dL (ref 0.2–1.2)
BUN / CREAT RATIO: 14 (calc) (ref 6–22)
BUN: 18 mg/dL (ref 7–25)
CHLORIDE: 104 mmol/L (ref 98–110)
CO2: 27 mmol/L (ref 20–32)
Calcium: 9 mg/dL (ref 8.6–10.3)
Creat: 1.3 mg/dL — ABNORMAL HIGH (ref 0.70–1.11)
GFR, EST AFRICAN AMERICAN: 58 mL/min/{1.73_m2} — AB (ref >=60)
GFR, EST NON AFRICAN AMERICAN: 50 mL/min/{1.73_m2} — AB (ref >=60)
GLOBULIN: 2.7 g/dL (ref 1.9–3.7)
Glucose, Bld: 137 mg/dL — ABNORMAL HIGH (ref 65–99)
POTASSIUM: 4.1 mmol/L (ref 3.5–5.3)
SODIUM: 138 mmol/L (ref 135–146)
TOTAL PROTEIN: 6.4 g/dL (ref 6.1–8.1)

## 2018-02-05 LAB — LIPID PANEL
CHOLESTEROL: 182 mg/dL (ref <200)
HDL: 29 mg/dL — ABNORMAL LOW (ref >40)
LDL Cholesterol (Calc): 100 mg/dL (calc) — ABNORMAL HIGH
NON-HDL CHOLESTEROL (CALC): 153 mg/dL — AB (ref <130)
Total CHOL/HDL Ratio: 6.3 (calc) — ABNORMAL HIGH (ref <5.0)
Triglycerides: 394 mg/dL — ABNORMAL HIGH (ref <150)

## 2018-02-05 LAB — HEMOGLOBIN A1C
EAG (MMOL/L): 6.6 (calc)
Hgb A1c MFr Bld: 5.8 % of total Hgb — ABNORMAL HIGH (ref <5.7)
MEAN PLASMA GLUCOSE: 120 (calc)

## 2018-02-05 LAB — VITAMIN D 25 HYDROXY (VIT D DEFICIENCY, FRACTURES): Vit D, 25-Hydroxy: 75 ng/mL (ref 30–100)

## 2018-02-05 LAB — TSH: TSH: 2.57 mIU/L (ref 0.40–4.50)

## 2018-02-05 LAB — MAGNESIUM: Magnesium: 1.8 mg/dL (ref 1.5–2.5)

## 2018-02-05 LAB — INSULIN, RANDOM: Insulin: 82.1 u[IU]/mL — ABNORMAL HIGH (ref 2.0–19.6)

## 2018-02-06 ENCOUNTER — Encounter: Payer: Self-pay | Admitting: *Deleted

## 2018-03-04 ENCOUNTER — Other Ambulatory Visit: Payer: Self-pay | Admitting: Internal Medicine

## 2018-05-06 ENCOUNTER — Ambulatory Visit: Payer: Self-pay | Admitting: Adult Health

## 2018-05-29 ENCOUNTER — Other Ambulatory Visit: Payer: Self-pay | Admitting: *Deleted

## 2018-05-29 MED ORDER — VITAMIN D (ERGOCALCIFEROL) 1.25 MG (50000 UNIT) PO CAPS: ORAL_CAPSULE | ORAL | 0 refills | Status: DC

## 2018-05-29 MED ORDER — FINASTERIDE 5 MG PO TABS: 5.0000 mg | ORAL_TABLET | Freq: Every day | ORAL | 1 refills | Status: DC

## 2018-05-29 MED ORDER — TERAZOSIN HCL 10 MG PO CAPS: 10.0000 mg | ORAL_CAPSULE | Freq: Every morning | ORAL | 1 refills | Status: DC

## 2018-05-29 MED ORDER — LOSARTAN POTASSIUM 100 MG PO TABS: 100.0000 mg | ORAL_TABLET | Freq: Every morning | ORAL | 1 refills | Status: DC

## 2018-06-10 DIAGNOSIS — D225 Melanocytic nevi of trunk: Secondary | ICD-10-CM | POA: Diagnosis not present

## 2018-06-10 DIAGNOSIS — L821 Other seborrheic keratosis: Secondary | ICD-10-CM | POA: Diagnosis not present

## 2018-06-10 DIAGNOSIS — D0339 Melanoma in situ of other parts of face: Secondary | ICD-10-CM | POA: Diagnosis not present

## 2018-06-10 DIAGNOSIS — D044 Carcinoma in situ of skin of scalp and neck: Secondary | ICD-10-CM | POA: Diagnosis not present

## 2018-06-10 DIAGNOSIS — L570 Actinic keratosis: Secondary | ICD-10-CM | POA: Diagnosis not present

## 2018-06-10 DIAGNOSIS — C44329 Squamous cell carcinoma of skin of other parts of face: Secondary | ICD-10-CM | POA: Diagnosis not present

## 2018-06-10 DIAGNOSIS — Z85828 Personal history of other malignant neoplasm of skin: Secondary | ICD-10-CM | POA: Diagnosis not present

## 2018-06-10 DIAGNOSIS — D1801 Hemangioma of skin and subcutaneous tissue: Secondary | ICD-10-CM | POA: Diagnosis not present

## 2018-06-10 DIAGNOSIS — L814 Other melanin hyperpigmentation: Secondary | ICD-10-CM | POA: Diagnosis not present

## 2018-07-09 DIAGNOSIS — Z85828 Personal history of other malignant neoplasm of skin: Secondary | ICD-10-CM | POA: Diagnosis not present

## 2018-07-09 DIAGNOSIS — D0339 Melanoma in situ of other parts of face: Secondary | ICD-10-CM | POA: Diagnosis not present

## 2018-07-16 DIAGNOSIS — C44329 Squamous cell carcinoma of skin of other parts of face: Secondary | ICD-10-CM | POA: Diagnosis not present

## 2018-07-16 DIAGNOSIS — Z85828 Personal history of other malignant neoplasm of skin: Secondary | ICD-10-CM | POA: Diagnosis not present

## 2018-08-06 ENCOUNTER — Encounter: Payer: Self-pay | Admitting: Internal Medicine

## 2018-08-06 ENCOUNTER — Other Ambulatory Visit: Payer: Self-pay | Admitting: Internal Medicine

## 2018-08-06 DIAGNOSIS — J449 Chronic obstructive pulmonary disease, unspecified: Secondary | ICD-10-CM

## 2018-08-06 DIAGNOSIS — M0579 Rheumatoid arthritis with rheumatoid factor of multiple sites without organ or systems involvement: Secondary | ICD-10-CM

## 2018-08-06 MED ORDER — PREDNISONE 5 MG PO TABS: ORAL_TABLET | ORAL | 1 refills | Status: DC

## 2018-08-07 ENCOUNTER — Other Ambulatory Visit: Payer: Self-pay | Admitting: *Deleted

## 2018-08-07 DIAGNOSIS — J449 Chronic obstructive pulmonary disease, unspecified: Secondary | ICD-10-CM

## 2018-08-07 DIAGNOSIS — M0579 Rheumatoid arthritis with rheumatoid factor of multiple sites without organ or systems involvement: Secondary | ICD-10-CM

## 2018-08-07 MED ORDER — PREDNISONE 5 MG PO TABS: ORAL_TABLET | ORAL | 1 refills | Status: DC

## 2018-08-12 ENCOUNTER — Telehealth: Payer: Self-pay | Admitting: *Deleted

## 2018-08-12 NOTE — Telephone Encounter (Signed)
Per Dr Melford Aase, the Prednisone 5 mg dose increase to 3 times a day or as directed, was confirmed with [facility].

## 2018-08-20 ENCOUNTER — Other Ambulatory Visit: Payer: Self-pay | Admitting: *Deleted

## 2018-08-20 MED ORDER — FINASTERIDE 5 MG PO TABS: 5.0000 mg | ORAL_TABLET | Freq: Every day | ORAL | 1 refills | Status: DC

## 2018-08-20 MED ORDER — TERAZOSIN HCL 10 MG PO CAPS: 10.0000 mg | ORAL_CAPSULE | Freq: Every morning | ORAL | 1 refills | Status: DC

## 2018-08-20 MED ORDER — LOSARTAN POTASSIUM 100 MG PO TABS: 100.0000 mg | ORAL_TABLET | Freq: Every morning | ORAL | 1 refills | Status: DC

## 2018-08-30 ENCOUNTER — Ambulatory Visit: Payer: PPO | Admitting: Adult Health Nurse Practitioner

## 2018-08-30 ENCOUNTER — Encounter: Payer: Self-pay | Admitting: Adult Health Nurse Practitioner

## 2018-08-30 VITALS — Ht 76.25 in | Wt 283.0 lb

## 2018-08-30 DIAGNOSIS — B353 Tinea pedis: Secondary | ICD-10-CM

## 2018-08-30 DIAGNOSIS — M0579 Rheumatoid arthritis with rheumatoid factor of multiple sites without organ or systems involvement: Secondary | ICD-10-CM | POA: Diagnosis not present

## 2018-08-30 DIAGNOSIS — Z0001 Encounter for general adult medical examination with abnormal findings: Secondary | ICD-10-CM | POA: Diagnosis not present

## 2018-08-30 DIAGNOSIS — N401 Enlarged prostate with lower urinary tract symptoms: Secondary | ICD-10-CM

## 2018-08-30 DIAGNOSIS — K219 Gastro-esophageal reflux disease without esophagitis: Secondary | ICD-10-CM | POA: Diagnosis not present

## 2018-08-30 DIAGNOSIS — K589 Irritable bowel syndrome without diarrhea: Secondary | ICD-10-CM

## 2018-08-30 DIAGNOSIS — J449 Chronic obstructive pulmonary disease, unspecified: Secondary | ICD-10-CM

## 2018-08-30 DIAGNOSIS — I10 Essential (primary) hypertension: Secondary | ICD-10-CM

## 2018-08-30 DIAGNOSIS — R6889 Other general symptoms and signs: Secondary | ICD-10-CM

## 2018-08-30 DIAGNOSIS — Z0000 Encounter for general adult medical examination without abnormal findings: Secondary | ICD-10-CM

## 2018-08-30 NOTE — Progress Notes (Addendum)
Virtual Visit via Televideo Note  I connected with@ on 09/02/18 at 10:30 AM EDT by televideo and verified that I am speaking with the correct person using two identifiers.   I discussed the limitations, risks, security and privacy concerns of performing an evaluation and management service and the availability of in person appointments. I also discussed with the patient that there may be a patient responsible charge related to this service. The patient expressed understanding and agreed to proceed.   Assessment and Plan:  Diagnoses and all orders for this visit:  Essential hypertension Hypertension Continue medication: Monitor blood pressure at home; call if consistently over 130/80 Continue DASH diet.   Reminder to go to the ER if any CP, SOB, nausea, dizziness, severe HA, changes vision/speech, left arm numbness and tingling and jaw pain.  Chronic obstructive pulmonary disease, unspecified COPD type ([place]) No triggers, well controlled symptoms, continue to monitor Not using maintenance inhaler at this time. Using prednisone 5mg  three times a week Has current albuterol in haler, last use over three months ago.  Gastroesophageal reflux disease, esophagitis presence not specified Doing well, diet controlled Continue to monitor for triggers  Irritable bowel syndrome, unspecified type Doing well at this time Diet controlled  Rheumatoid arthritis involving multiple sites with positive rheumatoid factor (HCC) Prednisone 5mg  3 times a week Follows with Dr Lenna Gilford.  Benign prostatic hyperplasia with lower urinary tract symptoms, symptom details unspecified Taking Terazosin 10mg  and proscar 5mg  No change in nocturia, x1 History of prostatitis  Tinea pedis, right OTC Lamasil Treat surrounding area Keep shower and bath surfaces clean    Follow Up Instructions:    I discussed the assessment and treatment plan with the patient. The patient was provided an opportunity to ask  questions and all were answered. The patient agreed with the plan and demonstrated an understanding of the instructions.  Will mail patient AVS as he does not utilize MyChart.   The patient was advised to call back or seek an in-person evaluation if the symptoms worsen or if the condition fails to improve as anticipated.  I provided 30 minutes of non-face-to-face time during this encounter including counseling, chart review and critical decision making.  MEDICARE ANNUAL WELLNESS VISIT AND FOLLOW UP Assessment:    Future Appointments  Date Time Provider [place]  10/24/2018  2:00 PM Unk Pinto, MD [facility] None  09/09/2019 10:00 AM [name] Sierras, NP [facility] None     Plan:   During the course of the visit the patient was educated and counseled about appropriate screening and preventive services including:    Pneumococcal vaccine   Influenza vaccine  Prevnar 13  Td vaccine  Screening electrocardiogram, deferred telephone visit, [place].  Colorectal cancer screening  Diabetes screening  Glaucoma screening  Nutrition counseling    Subjective:  [name] is a 85 y.o. male who presents for Medicare Annual Wellness Visit and 3 month follow up for HTN, hyperlipidemia, abnormal glucose, and vitamin D Def.   His blood pressure has been controlled at home, today their BP is   He does not workout. He denies chest pain, shortness of breath, dizziness.  He is not on cholesterol medication and denies myalgias. His cholesterol is not at goal. The cholesterol last visit was:   Lab Results  Component Value Date   CHOL 182 02/04/2018   HDL 29 (L) 02/04/2018   LDLCALC 100 (H) 02/04/2018   TRIG 394 (H) 02/04/2018   CHOLHDL 6.3 (H) 02/04/2018   He has  not been working on diet and exercise for abnormal glucose, and denies hyperglycemia, hypoglycemia , increased appetite, nausea, paresthesia of the feet, polydipsia, polyuria, visual disturbances and vomiting.  Last A1C in the office was:  Lab Results  Component Value Date   HGBA1C 5.8 (H) 02/04/2018   Last GFR Lab Results  Component Value Date   GFRNONAA 50 (L) 02/04/2018     Lab Results  Component Value Date   GFRAA 58 (L) 02/04/2018   Patient is on Vitamin D supplement.   Lab Results  Component Value Date   VD25OH 75 02/04/2018      Medication Review:  Current Outpatient Medications (Endocrine & Metabolic):  .  predniSONE (DELTASONE) 5 MG tablet, Take 1 tablet 3 x / day or as directed for Asthma & Rheumatoid Arthritis  Current Outpatient Medications (Cardiovascular):  .  losartan (COZAAR) 100 MG tablet, Take 1 tablet (100 mg total) by mouth every morning. .  terazosin (HYTRIN) 10 MG capsule, Take 1 capsule (10 mg total) by mouth every morning.  Current Outpatient Medications (Respiratory):  Marland Kitchen  Albuterol Sulfate (PROAIR RESPICLICK) 536 (90 Base) MCG/ACT AEPB, Inhale 108 mcg into the lungs every 6 (six) hours as needed (wheezing/cough/shortness of breath).    Current Outpatient Medications (Other):  .  finasteride (PROSCAR) 5 MG tablet, Take 1 tablet (5 mg total) by mouth daily. .  Multiple Vitamin (MULTIVITAMIN) tablet, Take 1 tablet by mouth daily.  .  Vitamin D, Ergocalciferol, (DRISDOL) 1.25 MG (50000 UT) CAPS capsule, Take 1 capsule by mouth every day or as directed  Allergies: Allergies  Allergen Reactions  . Ace Inhibitors   . Asa [Aspirin]     High Dose asprin  . Penicillins Hives  . Vasotec [Enalapril]     Cough  . Clindamycin/Lincomycin     Hives     Current Problems (verified) has Rheumatoid arthritis (HCC); GERD (gastroesophageal reflux disease); DJD (degenerative joint disease); IBS (irritable bowel syndrome); COPD (chronic obstructive pulmonary disease) ([place]); Benign prostatic hyperplasia; Asthma; Essential hypertension; Mixed hyperlipidemia; Vitamin D deficiency; Medication management; and Obesity (BMI 30.0-34.9) on their problem list.  Screening  Tests Immunization History  Administered Date(s) Administered  . DT 04/15/2014  . Influenza, High Dose Seasonal PF 04/15/2014, 02/16/2015, 02/18/2016, 02/04/2018  . Influenza-Unspecified 03/10/2013, 02/07/2017  . Pneumococcal Conjugate-13 04/15/2014  . Pneumococcal Polysaccharide-23 05/15/2001, 03/30/2011  . Td 05/15/2002  . Zoster 04/01/2012    Preventative care: Last colonoscopy: 2011  Prior vaccinations: TD or Tdap: 2015  Influenza: 2019 Pneumococcal: 2012 Prevnar13: 2015 Shingles/Zostavax: 2013  Names of Other Physician/Practitioners you currently use: 1. [facility] here for primary care 2. Eye exam:  Due 2018 3. Dentist:  2020  Patient Care Team: Unk Pinto, MD as PCP - General (Internal Medicine)  Surgical: He  has a past surgical history that includes Transurethral resection of prostate (SEVERAL YRS AGO); transthoracic echocardiogram (04-26-2011  ); Cardiovascular stress test (04-26-2011  DR BRACKBILL); Lumbar fusion (X3  LAST ONE 1996); and Scrotal exploration (Left, 07/17/2012). Family His family history includes Colon cancer in his father; Diabetes in his sister; Heart disease in his brother and father; Hypertension in his father. Social history  He reports that he quit smoking about 38 years ago. He has quit using smokeless tobacco.  His smokeless tobacco use included chew. He reports that he does not drink alcohol or use drugs.  MEDICARE WELLNESS OBJECTIVES: Physical activity: Current Exercise Habits: The patient does not participate in regular exercise at present,  Exercise limited by: respiratory conditions(s) Cardiac risk factors: Cardiac Risk Factors include: advanced age (>60men, >57 women);male gender;hypertension;sedentary lifestyle Depression/mood screen:   Depression screen [facility] 2/9 08/30/2018  Decreased Interest 0  Down, Depressed, Hopeless 0  PHQ - 2 Score 0    ADLs:  In your present state of health, do you  have any difficulty performing the following activities: 08/30/2018 02/04/2018  Hearing? N N  Vision? N N  Difficulty concentrating or making decisions? N N  Walking or climbing stairs? N N  Dressing or bathing? N N  Doing errands, shopping? N N  Preparing Food and eating ? N -  Using the Toilet? N -  In the past six months, have you accidently leaked urine? N -  Do you have problems with loss of bowel control? N -  Managing your Medications? N -  Managing your Finances? N -  Housekeeping or managing your Housekeeping? N -  Some recent data might be hidden     Cognitive Testing  Alert? Yes  Normal Appearance?Yes  Oriented to person? Yes  Place? Yes   Time? Yes  Recall of three objects?  Yes  Can perform simple calculations? Yes  Displays appropriate judgment?Yes  Can read the correct time from a watch face?Yes  EOL planning: Does Patient Have a Medical Advance Directive?: Yes Type of Advance Directive: Healthcare Power of Attorney, Living will Does patient want to make changes to medical advance directive?: Yes (Inpatient - patient defers changing a medical advance directive at this time - Information given) Copy of [place] in Chart?: No - copy requested   Objective:   Today's Vitals   08/30/18 1126  Weight: 283 lb (128.4 kg)  Height: 6' 4.25" (1.937 m)   Body mass index is 34.22 kg/m. Patient does not have equipment to check vital signs for todays visit.  General : Well sounding patient in no apparent distress HEENT: no hoarseness, no cough for duration of visit Lungs: speaks in complete sentences, no audible wheezing, no apparent distress Neurological: alert, oriented x 3 Psychiatric: pleasant, judgement appropriate   Medicare Attestation I have personally reviewed: The patient's medical and social history Their use of alcohol, tobacco or illicit drugs Their current medications and supplements The patient's functional ability including  ADLs,fall risks, home safety risks, cognitive, and hearing and visual impairment Diet and physical activities Evidence for depression or mood disorders  The patient's weight, height, BMI, and visual acuity have been recorded in the chart.  I have made referrals, counseling, and provided education to the patient based on review of the above and I have provided the patient with a written personalized care plan for preventive services.     [name] Sierras, NP [facility] 09/02/2018  11:27 AM

## 2018-09-02 ENCOUNTER — Other Ambulatory Visit: Payer: Self-pay

## 2018-09-02 ENCOUNTER — Telehealth: Payer: Self-pay

## 2018-09-02 NOTE — Patient Instructions (Addendum)
08/30/18 you had a telephone visit with [name] Sierras, DNP.  Below is a summary of your visit.   [name] , Thank you for taking time to come for your Medicare Wellness Visit. I appreciate your ongoing commitment to your health goals. Please review the following plan we discussed and let me know if I can assist you in the future.   This is a list of the screening recommended for you and due dates:  Health Maintenance  Topic Date Due   Flu Shot  12/14/2018   Tetanus Vaccine  04/15/2024   Pneumonia vaccines  Completed   You are due for an eye exam Use Lamasil cream, as we discussed in the appointment, you can get this at any pharmacy.  Apply this to the affected area as well as surrounding area.   Athlete's Foot  Athlete's foot (tinea pedis) is a fungal infection of the skin on your feet. It often occurs on the skin that is between or underneath your toes. It can also occur on the soles of your feet. Symptoms include itchy or white and flaky areas on the skin. The infection can spread from person to person (is contagious). It can also spread when a person's bare feet come in contact with the fungus on shower floors or on items such as shoes. Follow these instructions at home: Medicines  Apply or take over-the-counter and prescription medicines only as told by your doctor.  Apply your antifungal medicine as told by your doctor. Do not stop using the medicine even if your feet start to get better. Foot care  Do not scratch your feet.  Keep your feet dry: ? Wear cotton or wool socks. Change your socks every day or if they become wet. ? Wear shoes that allow air to move around, such as sandals or canvas tennis shoes.  Wash and dry your feet: ? Every day or as told by your doctor. ? After exercising. ? Including the area between your toes. General instructions  Do not share any of these items that touch your feet: ? Towels. ? Shoes. ? Nail clippers. ? Other personal  items.  Protect your feet by wearing sandals in wet areas, such as locker rooms and shared showers.  Keep all follow-up visits as told by your doctor. This is important.  If you have diabetes, keep your blood sugar under control. Contact a doctor if:  You have a fever.  You have swelling, pain, warmth, or redness in your foot.  Your feet are not getting better with treatment.  Your symptoms get worse.  You have new symptoms. Summary  Athlete's foot is a fungal infection of the skin on your feet.  Symptoms include itchy or white and flaky areas on the skin.  Apply your antifungal medicine as told by your doctor.  Keep your feet clean and dry. This information is not intended to replace advice given to you by your health care provider. Make sure you discuss any questions you have with your health care provider. Document Released: 10/18/2007 Document Revised: 02/19/2017 Document Reviewed: 02/19/2017 Elsevier Interactive Patient Education  2019 [place] (COVID-19) Are you at risk?  Are you at risk for the Coronavirus (COVID-19)?  To be considered HIGH RISK for Coronavirus (COVID-19), you have to meet the following criteria:   Traveled to [place], [place], [place], [place] or [place]; or in the [place] to [place], [place], [place]   or [place]; and have fever,  cough, and shortness of breath within the last 2 weeks of travel OR  Been in close contact with a person diagnosed with COVID-19 within the last 2 weeks and have   fever, cough,and shortness of breath    IF YOU DO NOT MEET THESE CRITERIA, YOU ARE CONSIDERED LOW RISK FOR COVID-19.  What to do if you are HIGH RISK for COVID-19?   If you are having a medical emergency, call 911.  Seek medical care right away. Before you go to a doctors office, urgent care or emergency department,   call ahead and tell them about your recent travel, contact with someone diagnosed with COVID-19     and your symptoms.   You should receive instructions from your physicians office regarding next steps of care.   When you arrive at healthcare provider, tell the healthcare staff immediately you have returned from   visiting [place], [place], [place], [place] or [place]; or traveled in the [place] to [place], [place],   [place] or [place] in the last two weeks or you have been in close contact with a person diagnosed with   COVID-19 in the last 2 weeks.    Tell the health care staff about your symptoms: fever, cough and shortness of breath.  After you have been seen by a medical provider, you will be either: o Tested for (COVID-19) and discharged home on quarantine except to seek medical care if  o symptoms worsen, and asked to  - Stay home and avoid contact with others until you get your results (4-5 days)  - Avoid travel on public transportation if possible (such as bus, train, or airplane) or o Sent to the Emergency Department by EMS for evaluation, COVID-19 testing  and  o possible admission depending on your condition and test results.  What to do if you are LOW RISK for COVID-19?  Reduce your risk of any infection by using the same precautions used for avoiding the common cold or flu:   Wash your hands often with soap and warm water for at least 20 seconds.  If soap and water are not readily available,   use an alcohol-based hand sanitizer with at least 60% alcohol.   If coughing or sneezing, cover your mouth and nose by coughing or sneezing into the elbow areas of your shirt or coat,   into a tissue or into your sleeve (not your hands).  Avoid shaking hands with others and consider head nods or verbal greetings only.  Avoid touching your eyes, nose, or mouth with unwashed hands.   Avoid close contact with people who are sick.  Avoid places or events with large numbers of people in one location, like concerts or sporting events.  Carefully consider  travel plans you have or are making.  If you are planning any travel outside or inside the [place], visit the [facility] webpage for the latest health notices.  If you have some symptoms but not all symptoms, continue to monitor at home and seek medical attention   if your symptoms worsen.  If you are having a medical emergency, call 911. >>>>>>>>>>>>>>>>>>>>>>>>>>>>>>>>>>>>>>>>>>>>>>>>>>>>>>> We Do NOT Approve of  [facility], [employer]  Our Patients  To Do Home Visits  & We Do NOT Approve of [employer] > > > > > > > > > > > > > > > > > > > > > > > > > > > > > > > > > > >  > > > >  Preventive Care for Adults  A healthy lifestyle and preventive care can promote health and wellness. Preventive health guidelines for men include the following key practices:  A routine yearly physical is a good way to check with your health care provider about your health and preventative screening. It is a chance to share any concerns and updates on your health and to receive a thorough exam.  Visit your dentist for a routine exam and preventative care every 6 months. Brush your teeth twice a day and floss once a day. Good oral hygiene prevents tooth decay and gum disease.  The frequency of eye exams is based on your age, health, family medical history, use of contact lenses, and other factors. Follow your health care provider's recommendations for frequency of eye exams.  Eat a healthy diet. Foods such as vegetables, fruits, whole grains, low-fat dairy products, and lean protein foods contain the nutrients you need without too many calories. Decrease your intake of foods high in solid fats, added sugars, and salt. Eat the right amount of calories for you. Get information about a proper diet from your health care provider, if necessary.  Regular physical exercise is one of the most important things you can do for your health. Most adults should get at least 150 minutes  of moderate-intensity exercise (any activity that increases your heart rate and causes you to sweat) each week. In addition, most adults need muscle-strengthening exercises on 2 or more days a week.  Maintain a healthy weight. The body mass index (BMI) is a screening tool to identify possible weight problems. It provides an estimate of body fat based on height and weight. Your health care provider can find your BMI and can help you achieve or maintain a healthy weight. For adults 20 years and older:  A BMI below 18.5 is considered underweight.  A BMI of 18.5 to 24.9 is normal.  A BMI of 25 to 29.9 is considered overweight.  A BMI of 30 and above is considered obese.  Maintain normal blood lipids and cholesterol levels by exercising and minimizing your intake of saturated fat. Eat a balanced diet with plenty of fruit and vegetables. Blood tests for lipids and cholesterol should begin at age 45 and be repeated every 5 years. If your lipid or cholesterol levels are high, you are over 50, or you are at high risk for heart disease, you may need your cholesterol levels checked more frequently. Ongoing high lipid and cholesterol levels should be treated with medicines if diet and exercise are not working.  If you smoke, find out from your health care provider how to quit. If you do not use tobacco, do not start.  Lung cancer screening is recommended for adults aged 48-80 years who are at high risk for developing lung cancer because of a history of smoking. A yearly low-dose CT scan of the lungs is recommended for people who have at least a 30-pack-year history of smoking and are a current smoker or have quit within the past 15 years. A pack year of smoking is smoking an average of 1 pack of cigarettes a day for 1 year (for example: 1 pack a day for 30 years or 2 packs a day for 15 years). Yearly screening should continue until the smoker has stopped smoking for at least 15 years. Yearly screening should be  stopped for people who develop a health problem that would prevent them from having lung cancer treatment.  If you choose to drink alcohol,  do not have more than 2 drinks per day. One drink is considered to be 12 ounces (355 mL) of beer, 5 ounces (148 mL) of wine, or 1.5 ounces (44 mL) of liquor.  Avoid use of street drugs. Do not share needles with anyone. Ask for help if you need support or instructions about stopping the use of drugs.  High blood pressure causes heart disease and increases the risk of stroke. Your blood pressure should be checked at least every 1-2 years. Ongoing high blood pressure should be treated with medicines, if weight loss and exercise are not effective.  If you are 36-29 years old, ask your health care provider if you should take aspirin to prevent heart disease.  Diabetes screening involves taking a blood sample to check your fasting blood sugar level. Testing should be considered at a younger age or be carried out more frequently if you are overweight and have at least 1 risk factor for diabetes.  Colorectal cancer can be detected and often prevented. Most routine colorectal cancer screening begins at the age of 57 and continues through age 42. However, your health care provider may recommend screening at an earlier age if you have risk factors for colon cancer. On a yearly basis, your health care provider may provide home test kits to check for hidden blood in the stool. Use of a small camera at the end of a tube to directly examine the colon (sigmoidoscopy or colonoscopy) can detect the earliest forms of colorectal cancer. Talk to your health care provider about this at age 27, when routine screening begins. Direct exam of the colon should be repeated every 5-10 years through age 32, unless early forms of precancerous polyps or small growths are found.  Hepatitis C blood testing is recommended for all people born from [birth year] through 1963 and any individual with known  risks for hepatitis C.  Screening for abdominal aortic aneurysm (AAA)  by ultrasound is recommended for people who have history of high blood pressure or who are current or former smokers.  Healthy men should  receive prostate-specific antigen (PSA) blood tests as part of routine cancer screening. Talk with your health care provider about prostate cancer screening.  Testicular cancer screening is  recommended for adult males. Screening includes self-exam, a health care provider exam, and other screening tests. Consult with your health care provider about any symptoms you have or any concerns you have about testicular cancer.  Use sunscreen. Apply sunscreen liberally and repeatedly throughout the day. You should seek shade when your shadow is shorter than you. Protect yourself by wearing long sleeves, pants, a wide-brimmed hat, and sunglasses year round, whenever you are outdoors.  Once a month, do a whole-body skin exam, using a mirror to look at the skin on your back. Tell your health care provider about new moles, moles that have irregular borders, moles that are larger than a pencil eraser, or moles that have changed in shape or color.  Stay current with required vaccines (immunizations).  Influenza vaccine. All adults should be immunized every year.  Tetanus, diphtheria, and acellular pertussis (Td, Tdap) vaccine. An adult who has not previously received Tdap or who does not know his vaccine status should receive 1 dose of Tdap. This initial dose should be followed by tetanus and diphtheria toxoids (Td) booster doses every 10 years. Adults with an unknown or incomplete history of completing a 3-dose immunization series with Td-containing vaccines should begin or complete a primary immunization series including a  Tdap dose. Adults should receive a Td booster every 10 years.  Zoster vaccine. One dose is recommended for adults aged 40 years or older unless certain conditions are  present.    PREVNAR - Pneumococcal 13-valent conjugate (PCV13) vaccine. When indicated, a person who is uncertain of his immunization history and has no record of immunization should receive the PCV13 vaccine. An adult aged 49 years or older who has certain medical conditions and has not been previously immunized should receive 1 dose of PCV13 vaccine. This PCV13 should be followed with a dose of pneumococcal polysaccharide (PPSV23) vaccine. The PPSV23 vaccine dose should be obtained 1 or more year(s)after the dose of PCV13 vaccine. An adult aged 99 years or older who has certain medical conditions and previously received 1 or more doses of PPSV23 vaccine should receive 1 dose of PCV13. The PCV13 vaccine dose should be obtained 1 or more years after the last PPSV23 vaccine dose.    PNEUMOVAX - Pneumococcal polysaccharide (PPSV23) vaccine. When PCV13 is also indicated, PCV13 should be obtained first. All adults aged 34 years and older should be immunized. An adult younger than age 30 years who has certain medical conditions should be immunized. Any person who resides in a nursing home or long-term care facility should be immunized. An adult smoker should be immunized. People with an immunocompromised condition and certain other conditions should receive both PCV13 and PPSV23 vaccines. People with human immunodeficiency virus (HIV) infection should be immunized as soon as possible after diagnosis. Immunization during chemotherapy or radiation therapy should be avoided. Routine use of PPSV23 vaccine is not recommended for American Indians, [place], or people younger than 65 years unless there are medical conditions that require PPSV23 vaccine. When indicated, people who have unknown immunization and have no record of immunization should receive PPSV23 vaccine. One-time revaccination 5 years after the first dose of PPSV23 is recommended for people aged 19-64 years who have chronic kidney failure,  nephrotic syndrome, asplenia, or immunocompromised conditions. People who received 1-2 doses of PPSV23 before age 62 years should receive another dose of PPSV23 vaccine at age 13 years or later if at least 5 years have passed since the previous dose. Doses of PPSV23 are not needed for people immunized with PPSV23 at or after age 80 years.    Hepatitis A vaccine. Adults who wish to be protected from this disease, have certain high-risk conditions, work with hepatitis A-infected animals, work in hepatitis A research labs, or travel to or work in countries with a high rate of hepatitis A should be immunized. Adults who were previously unvaccinated and who anticipate close contact with an international adoptee during the first 60 days after arrival in the [place] from a country with a high rate of hepatitis A should be immunized.    Hepatitis B vaccine. Adults should be immunized if they wish to be protected from this disease, have certain high-risk conditions, may be exposed to blood or other infectious body fluids, are household contacts or sex partners of hepatitis B positive people, are clients or workers in certain care facilities, or travel to or work in countries with a high rate of hepatitis B.   Preventive Service / Frequency   Ages 75 and over  Blood pressure check.  Lipid and cholesterol check.  Lung cancer screening. / Every year if you are aged 69-80 years and have a 30-pack-year history of smoking and currently smoke or have quit within the past 15 years. Yearly screening is  stopped once you have quit smoking for at least 15 years or develop a health problem that would prevent you from having lung cancer treatment.  Fecal occult blood test (FOBT) of stool. You may not have to do this test if you get a colonoscopy every 10 years.  Flexible sigmoidoscopy** or colonoscopy.** / Every 5 years for a flexible sigmoidoscopy or every 10 years for a colonoscopy beginning at age 39 and  continuing until age 25.  Hepatitis C blood test.** / For all people born from [birth year] through 1963 and any individual with known risks for hepatitis C.  Abdominal aortic aneurysm (AAA) screening./ Screening current or former smokers or have Hypertension.  Skin self-exam. / Monthly.  Influenza vaccine. / Every year.  Tetanus, diphtheria, and acellular pertussis (Tdap/Td) vaccine.** / 1 dose of Td every 10 years.   Zoster vaccine.** / 1 dose for adults aged 51 years or older.         Pneumococcal 13-valent conjugate (PCV13) vaccine.    Pneumococcal polysaccharide (PPSV23) vaccine.     Hepatitis A vaccine.** / Consult your health care provider.  Hepatitis B vaccine.** / Consult your health care provider. Screening for abdominal aortic aneurysm (AAA)  by ultrasound is recommended for people who have history of high blood pressure or who are current or former smokers. ++++++++++ Recommend Adult Low Dose Aspirin or  coated  Aspirin 81 mg daily  To reduce risk of Colon Cancer 20 %,  Skin Cancer 26 % ,  Malignant Melanoma 46%  and  Pancreatic cancer 60% ++++++++++++++++++++++ Vitamin D goal  is between 70-100.  Please make sure that you are taking your Vitamin D as directed.  It is very important as a natural anti-inflammatory  helping hair, skin, and nails, as well as reducing stroke and heart attack risk.  It helps your bones and helps with mood. It also decreases numerous cancer risks so please take it as directed.  Low Vit D is associated with a 200-300% higher risk for CANCER  and 200-300% higher risk for HEART   ATTACK  &  STROKE.   .....................................Marland Kitchen It is also associated with higher death rate at younger ages,  autoimmune diseases like Rheumatoid arthritis, Lupus, Multiple Sclerosis.    Also many other serious conditions, like depression, Alzheimer's Dementia, infertility, muscle aches, fatigue, fibromyalgia - just to name a  few. ++++++++++++++++++++++ Recommend the book "The END of DIETING" by Dr Excell Seltzer  & the book "The END of DIABETES " by Dr Excell Seltzer At [email or web address] - get book & Audio CD's    Being diabetic has a  300% increased risk for heart attack, stroke, cancer, and alzheimer- type vascular dementia. It is very important that you work harder with diet by avoiding all foods that are white. Avoid white rice (brown & wild rice is OK), white potatoes (sweetpotatoes in moderation is OK), White bread or wheat bread or anything made out of white flour like bagels, donuts, rolls, buns, biscuits, cakes, pastries, cookies, pizza crust, and pasta (made from white flour & egg whites) - vegetarian pasta or spinach or wheat pasta is OK. Multigrain breads like Arnold's or Pepper[employer], or multigrain sandwich thins or flatbreads.  Diet, exercise and weight loss can reverse and cure diabetes in the early stages.  Diet, exercise and weight loss is very important in the control and prevention of complications of diabetes which affects every system in your body, ie. Brain - dementia/stroke, eyes - glaucoma/blindness, heart - heart attack/heart failure,  kidneys - dialysis, stomach - gastric paralysis, intestines - malabsorption, nerves - severe painful neuritis, circulation - gangrene & loss of a leg(s), and finally cancer and Alzheimers.    I recommend avoid fried & greasy foods,  sweets/candy, white rice (brown or wild rice or Quinoa is OK), white potatoes (sweet potatoes are OK) - anything made from white flour - bagels, doughnuts, rolls, buns, biscuits,white and wheat breads, pizza crust and traditional pasta made of white flour & egg white(vegetarian pasta or spinach or wheat pasta is OK).  Multi-grain bread is OK - like multi-grain flat bread or sandwich thins. Avoid alcohol in excess. Exercise is also important.    Eat all the vegetables you want - avoid meat, especially red meat and dairy - especially cheese.  Cheese is  the most concentrated form of trans-fats which is the worst thing to clog up our arteries. Veggie cheese is OK which can be found in the fresh produce section at [employer] or [employer] or [employer]  ++++++++++++++++++++++ DASH Eating Plan  DASH stands for "Dietary Approaches to Stop Hypertension."   The DASH eating plan is a healthy eating plan that has been shown to reduce high blood pressure (hypertension). Additional health benefits may include reducing the risk of type 2 diabetes mellitus, heart disease, and stroke. The DASH eating plan may also help with weight loss. WHAT DO I NEED TO KNOW ABOUT THE DASH EATING PLAN? For the DASH eating plan, you will follow these general guidelines:  Choose foods with a percent daily value for sodium of less than 5% (as listed on the food label).  Use salt-free seasonings or herbs instead of table salt or sea salt.  Check with your health care provider or pharmacist before using salt substitutes.  Eat lower-sodium products, often labeled as "lower sodium" or "no salt added."  Eat fresh foods.  Eat more vegetables, fruits, and low-fat dairy products.  Choose whole grains. Look for the word "whole" as the first word in the ingredient list.  Choose fish   Limit sweets, desserts, sugars, and sugary drinks.  Choose heart-healthy fats.  Eat veggie cheese   Eat more home-cooked food and less restaurant, buffet, and fast food.  Limit fried foods.  Cook foods using methods other than frying.  Limit canned vegetables. If you do use them, rinse them well to decrease the sodium.  When eating at a restaurant, ask that your food be prepared with less salt, or no salt if possible.                      WHAT FOODS CAN I EAT? Read Dr Fara Olden Fuhrman's books on The End of Dieting & The End of Diabetes  Grains Whole grain or whole wheat bread. Brown rice. Whole grain or whole wheat pasta. Quinoa, bulgur, and whole grain cereals. Low-sodium  cereals. Corn or whole wheat flour tortillas. Whole grain cornbread. Whole grain crackers. Low-sodium crackers.  Vegetables Fresh or frozen vegetables (raw, steamed, roasted, or grilled). Low-sodium or reduced-sodium tomato and vegetable juices. Low-sodium or reduced-sodium tomato sauce and paste. Low-sodium or reduced-sodium canned vegetables.   Fruits All fresh, canned (in natural juice), or frozen fruits.  Protein Products  All fish and seafood.  Dried beans, peas, or lentils. Unsalted nuts and seeds. Unsalted canned beans.  Dairy Low-fat dairy products, such as skim or 1% milk, 2% or reduced-fat cheeses, low-fat ricotta or cottage cheese, or plain low-fat yogurt. Low-sodium or reduced-sodium cheeses.  Fats and  Oils Tub margarines without trans fats. Light or reduced-fat mayonnaise and salad dressings (reduced sodium). Avocado. Safflower, olive, or canola oils. Natural peanut or almond butter.  Other Unsalted popcorn and pretzels. The items listed above may not be a complete list of recommended foods or beverages. Contact your dietitian for more options.  ++++++++++++++++++++  WHAT FOODS ARE NOT RECOMMENDED? Grains/ White flour or wheat flour White bread. White pasta. White rice. Refined cornbread. Bagels and croissants. Crackers that contain trans fat.  Vegetables  Creamed or fried vegetables. Vegetables in a . Regular canned vegetables. Regular canned tomato sauce and paste. Regular tomato and vegetable juices.  Fruits Dried fruits. Canned fruit in light or heavy syrup. Fruit juice.  Meat and Other Protein Products Meat in general - RED meat & White meat.  Fatty cuts of meat. Ribs, chicken wings, all processed meats as bacon, sausage, bologna, salami, fatback, hot dogs, bratwurst and packaged luncheon meats.  Dairy Whole or 2% milk, cream, half-and-half, and cream cheese. Whole-fat or sweetened yogurt. Full-fat cheeses or blue cheese. Non-dairy creamers and whipped  toppings. Processed cheese, cheese spreads, or cheese curds.  Condiments Onion and garlic salt, seasoned salt, table salt, and sea salt. Canned and packaged gravies. Worcestershire sauce. Tartar sauce. Barbecue sauce. Teriyaki sauce. Soy sauce, including reduced sodium. Steak sauce. Fish sauce. Oyster sauce. Cocktail sauce. Horseradish. Ketchup and mustard. Meat flavorings and tenderizers. Bouillon cubes. Hot sauce. Tabasco sauce. Marinades. Taco seasonings. Relishes.  Fats and Oils Butter, stick margarine, lard, shortening and bacon fat. Coconut, palm kernel, or palm oils. Regular salad dressings.  Pickles and olives. Salted popcorn and pretzels.  The items listed above may not be a complete list of foods and beverages to avoid.

## 2018-09-02 NOTE — Telephone Encounter (Signed)
AVS was printed & mailed as requested. April 20th 2020 at 11:45am by DD

## 2018-09-02 NOTE — Telephone Encounter (Signed)
-----   Message from Garnet Sierras, NP sent at 09/02/2018 10:54 AM EDT ----- Regarding: AVS Can you please print the after visit summary from 08/30/18 and mail to this patient.  Thank you,           Danton Sewer

## 2018-10-24 ENCOUNTER — Encounter: Payer: PPO | Admitting: Internal Medicine

## 2018-11-22 DIAGNOSIS — C44519 Basal cell carcinoma of skin of other part of trunk: Secondary | ICD-10-CM | POA: Diagnosis not present

## 2018-11-22 DIAGNOSIS — C44319 Basal cell carcinoma of skin of other parts of face: Secondary | ICD-10-CM | POA: Diagnosis not present

## 2018-11-22 DIAGNOSIS — D1801 Hemangioma of skin and subcutaneous tissue: Secondary | ICD-10-CM | POA: Diagnosis not present

## 2018-11-22 DIAGNOSIS — L820 Inflamed seborrheic keratosis: Secondary | ICD-10-CM | POA: Diagnosis not present

## 2018-11-22 DIAGNOSIS — D0462 Carcinoma in situ of skin of left upper limb, including shoulder: Secondary | ICD-10-CM | POA: Diagnosis not present

## 2018-11-22 DIAGNOSIS — L570 Actinic keratosis: Secondary | ICD-10-CM | POA: Diagnosis not present

## 2018-11-22 DIAGNOSIS — D045 Carcinoma in situ of skin of trunk: Secondary | ICD-10-CM | POA: Diagnosis not present

## 2018-11-22 DIAGNOSIS — L821 Other seborrheic keratosis: Secondary | ICD-10-CM | POA: Diagnosis not present

## 2018-11-22 DIAGNOSIS — C4441 Basal cell carcinoma of skin of scalp and neck: Secondary | ICD-10-CM | POA: Diagnosis not present

## 2018-11-22 DIAGNOSIS — Z85828 Personal history of other malignant neoplasm of skin: Secondary | ICD-10-CM | POA: Diagnosis not present

## 2018-11-22 DIAGNOSIS — Z85820 Personal history of malignant melanoma of skin: Secondary | ICD-10-CM | POA: Diagnosis not present

## 2018-12-24 ENCOUNTER — Other Ambulatory Visit: Payer: Self-pay | Admitting: Internal Medicine

## 2018-12-24 DIAGNOSIS — M0579 Rheumatoid arthritis with rheumatoid factor of multiple sites without organ or systems involvement: Secondary | ICD-10-CM

## 2018-12-24 DIAGNOSIS — J449 Chronic obstructive pulmonary disease, unspecified: Secondary | ICD-10-CM

## 2018-12-24 MED ORDER — PREDNISONE 5 MG PO TABS: ORAL_TABLET | ORAL | 1 refills | Status: DC

## 2019-02-12 ENCOUNTER — Encounter: Payer: Self-pay | Admitting: Internal Medicine

## 2019-02-12 NOTE — Progress Notes (Signed)
Annual  Screening/Preventative Visit  & Comprehensive Evaluation & Examination     This very nice 85 y.o. MWM  presents for a Screening /Preventative Visit & comprehensive evaluation and management of multiple medical co-morbidities.  Patient has been followed for HTN, HLD, Prediabetes and Vitamin D Deficiency. Patient has hx/o COPD and intermittent asthma predating 35+ years. Patient also has hx/o IBS& GERD controlled with diet.      Patient has Rheumatoid Arthritis (2010) followed by Dr Berna Bue.     HTN predates since 1999. Patient's BP has been controlled at home.  Today's BP was initially elevated & rechecked at goal - 128/78. Patient denies any cardiac symptoms as chest pain, palpitations, shortness of breath, dizziness or ankle swelling.     Patient's hyperlipidemia is not controlled with diet.  Last lipids were not at goal:  Lab Results  Component Value Date   CHOL 187 02/13/2019   HDL 31 (L) 02/13/2019   LDLCALC 115 (H) 02/13/2019   TRIG 289 (H) 02/13/2019   CHOLHDL 6.0 (H) 02/13/2019      Patient has Morbid Obesity (BMI 33+) and  hx/o prediabetes (A1c 5.7% / 2013) and patient denies reactive hypoglycemic symptoms, visual blurring, diabetic polys or paresthesias. Last A1c was not at goal:  Lab Results  Component Value Date   HGBA1C 5.8 (H) 02/13/2019       Finally, patient has history of Vitamin D Deficiency ("25" / 2008) and last vitamin D was at goal:  Lab Results  Component Value Date   VD25OH >150 (H) 02/13/2019   Current Outpatient Medications on File Prior to Visit  Medication Sig  . Albuterol Sulfate (PROAIR RESPICLICK) 123XX123 (90 Base) MCG/ACT AEPB Inhale 108 mcg into the lungs every 6 (six) hours as needed (wheezing/cough/shortness of breath).  . finasteride (PROSCAR) 5 MG tablet Take 1 tablet (5 mg total) by mouth daily.  Marland Kitchen losartan (COZAAR) 100 MG tablet Take 1 tablet (100 mg total) by mouth every morning.  . Multiple Vitamin (MULTIVITAMIN) tablet Take 1  tablet by mouth daily.   . predniSONE (DELTASONE) 5 MG tablet Take 1 tablet 3 x / day or as directed for Asthma & Rheumatoid Arthritis  . terazosin (HYTRIN) 10 MG capsule Take 1 capsule (10 mg total) by mouth every morning.  . Vitamin D, Ergocalciferol, (DRISDOL) 1.25 MG (50000 UT) CAPS capsule Take 1 capsule by mouth every day or as directed   No current facility-administered medications on file prior to visit.    Allergies  Allergen Reactions  . Ace Inhibitors   . Asa [Aspirin]     High Dose asprin  . Penicillins Hives  . Vasotec [Enalapril]     Cough  . Clindamycin/Lincomycin     Hives    Past Medical History:  Diagnosis Date  . Allergy   . Asthma   . BPH (benign prostatic hypertrophy)   . BPH (benign prostatic hypertrophy)   . COPD (chronic obstructive pulmonary disease) ([place])   . DJD (degenerative joint disease)   . GERD (gastroesophageal reflux disease)   . History of urinary retention   . Hypertension   . IBS (irritable bowel syndrome)   . RBBB   . Rheumatoid arthritis(714.0)    DR. HAWKES  . Scrotal lesion   . Short of breath on exertion    Health Maintenance  Topic Date Due  . INFLUENZA VACCINE  12/14/2018  . TETANUS/TDAP  04/15/2024  . PNA vac Low Risk Adult  Completed   Immunization History  Administered Date(s) Administered  . DT 04/15/2014  . Influenza, High Dose Seasonal PF 04/15/2014, 02/16/2015, 02/18/2016, 02/04/2018  . Influenza-Unspecified 03/10/2013, 02/07/2017  . Pneumococcal Conjugate-13 04/15/2014  . Pneumococcal Polysaccharide-23 05/15/2001, 03/30/2011  . Td 05/15/2002  . Zoster 04/01/2012   Last Colon - 09/28/2009 - Dr Deborha Payment - No f/u recommended due to age  Past Surgical History:  Procedure Laterality Date  . CARDIOVASCULAR STRESS TEST  04-26-2011  DR BRACKBILL   LOW RISK STRESS NUCLEAR STUDY/ NO EVIDENCE ISCHEMIA  . LUMBAR FUSION  X3  LAST ONE 1996  . SCROTAL EXPLORATION Left 07/17/2012   Procedure:  EXCISION  OF LEFT SCROTAL SKIN  LESION;  Surgeon: Molli Hazard, MD;  Location: [facility];  Service: Urology;  Laterality: Left;  . TRANSTHORACIC ECHOCARDIOGRAM  04-26-2011     MILD LVH/ LVSF NORMAL/ EF XX123456 GRADE I DIASTOLIC DYSFUNCTION/ MILD MITRAL REGURG.  . TRANSURETHRAL RESECTION OF PROSTATE  SEVERAL YRS AGO   Family History  Problem Relation Age of Onset  . Heart disease Father   . Colon cancer Father   . Hypertension Father   . Diabetes Sister   . Heart disease Brother    Social History   Socioeconomic History  . Marital status: Married    Spouse name: Remo Lipps  . Number of children: 2 daughters & 5 GC  Occupational History  . Occupation:     Fish farm manager: Retired Administrator  Tobacco Use  . Smoking status: Former Smoker    Quit date: 07/11/1980    Years since quitting: 38.6  . Smokeless tobacco: Former Systems developer    Types: Chew  Substance and Sexual Activity  . Alcohol use: No  . Drug use: No  . Sexual activity: Not on file   ROS Constitutional: Denies fever, chills, weight loss/gain, headaches, insomnia,  night sweats or change in appetite. Does c/o fatigue. Eyes: Denies redness, blurred vision, diplopia, discharge, itchy or watery eyes.  ENT: Denies discharge, congestion, post nasal drip, epistaxis, sore throat, earache, hearing loss, dental pain, Tinnitus, Vertigo, Sinus pain or snoring.  Cardio: Denies chest pain, palpitations, irregular heartbeat, syncope, dyspnea, diaphoresis, orthopnea, PND, claudication or edema Respiratory: denies cough, dyspnea, DOE, pleurisy, hoarseness, laryngitis or wheezing.  Gastrointestinal: Denies dysphagia, heartburn, reflux, water brash, pain, cramps, nausea, vomiting, bloating, diarrhea, constipation, hematemesis, melena, hematochezia, jaundice or hemorrhoids Genitourinary: Denies dysuria, frequency, urgency, nocturia, hesitancy, discharge, hematuria or flank pain Musculoskeletal: Denies arthralgia, myalgia, stiffness, Jt. Swelling, pain, limp or  strain/sprain. Denies Falls. Skin: Denies puritis, rash, hives, warts, acne, eczema or change in skin lesion Neuro: No weakness, tremor, incoordination, spasms, paresthesia or pain Psychiatric: Denies confusion, memory loss or sensory loss. Denies Depression. Endocrine: Denies change in weight, skin, hair change, nocturia, and paresthesia, diabetic polys, visual blurring or hyper / hypo glycemic episodes.  Heme/Lymph: No excessive bleeding, bruising or enlarged lymph nodes.  Physical Exam  BP 128/78   Pulse 82   Temp 97.9 F (36.6 C)   Resp 18   Ht 6' 4.5" (1.943 m)   Wt 290 lb 12.8 oz (131.9 kg)   BMI 34.94 kg/m   General Appearance: Well nourished and well groomed and in no apparent distress.  Eyes: PERRLA, EOMs, conjunctiva no swelling or erythema, normal fundi and vessels. Sinuses: No frontal/maxillary tenderness ENT/Mouth: EACs patent / TMs  nl. Nares clear without erythema, swelling, mucoid exudates. Oral hygiene is good. No erythema, swelling, or exudate. Tongue normal, non-obstructing. Tonsils not swollen or erythematous. Hearing normal.  Neck:  Supple, thyroid not palpable. No bruits, nodes or JVD. Respiratory: Respiratory effort normal.  BS equal and clear bilateral without rales, rhonci, wheezing or stridor. Cardio: Heart sounds are normal with regular rate and rhythm and no murmurs, rubs or gallops. Peripheral pulses are normal and equal bilaterally without edema. No aortic or femoral bruits. Chest: symmetric with normal excursions and percussion.  Abdomen: Soft, with Nl bowel sounds. Nontender, no guarding, rebound, hernias, masses, or organomegaly.  Lymphatics: Non tender without lymphadenopathy.  Musculoskeletal: Full ROM all peripheral extremities, joint stability, 5/5 strength, and normal gait. Skin: Warm and dry without rashes, lesions, cyanosis, clubbing or  ecchymosis.  Neuro: Cranial nerves intact, reflexes equal bilaterally. Normal muscle tone, no cerebellar  symptoms. Sensation intact.  Pysch: Alert and oriented X 3 with normal affect, insight and judgment appropriate.   Assessment and Plan  1. Annual Preventative/Screening Exam   2. Essential hypertension  - [place], RETROPERITNL ABD,  LTD - Urinalysis, Routine w reflex microscopic - Microalbumin / creatinine urine ratio - CBC with Differential/Platelet - COMPLETE METABOLIC PANEL WITH GFR - Magnesium - TSH - EKG 12-Lead  3. Hyperlipidemia, mixed  - [place], RETROPERITNL ABD,  LTD - Lipid panel - TSH - EKG 12-Lead  4. Abnormal glucose  - [place], RETROPERITNL ABD,  LTD - Hemoglobin A1c - Insulin, random - EKG 12-Lead  5. Vitamin D deficiency  - VITAMIN D 25 Hydroxyl  6. Prediabetes  - [place], RETROPERITNL ABD,  LTD - Hemoglobin A1c - Insulin, random - EKG 12-Lead  7. Chronic obstructive pulmonary disease ([place])  8. Rheumatoid arthritis  ([place])  9. Gastroesophageal reflux disease  - CBC with Differential/Platelet  10. Screening for colorectal cancer  - POC Hemoccult Bld/Stl  11. Prostate cancer screening  - PSA  12. BPH with obstruction/lower urinary tract symptoms  - PSA  13. Screening for ischemic heart disease  - EKG 12-Lead  14. FHx: heart disease  - [place], RETROPERITNL ABD,  LTD - EKG 12-Lead  15. Former smoker  - [place], RETROPERITNL ABD,  LTD - EKG 12-Lead  16. Screening for AAA (aortic abdominal aneurysm)  - [place], RETROPERITNL ABD,  LTD  17. Medication management  - Urinalysis, Routine w reflex microscopic - Microalbumin / creatinine urine ratio - CBC with Differential/Platelet - COMPLETE METABOLIC PANEL WITH GFR - Magnesium - Lipid panel - TSH - Hemoglobin A1c - Insulin, random - VITAMIN D 25 Hydroxyl         Patient was counseled in prudent diet, weight control to achieve/maintain BMI less than 25, BP monitoring, regular exercise and medications as discussed.  Discussed med effects and SE's. Routine screening labs and tests as requested with  regular follow-up as recommended. Over 40 minutes of exam, counseling, chart review and high complex critical decision making was performed   Kirtland Bouchard, MD

## 2019-02-12 NOTE — Patient Instructions (Signed)

## 2019-02-13 ENCOUNTER — Other Ambulatory Visit: Payer: Self-pay

## 2019-02-13 ENCOUNTER — Ambulatory Visit (INDEPENDENT_AMBULATORY_CARE_PROVIDER_SITE_OTHER): Payer: PPO | Admitting: Internal Medicine

## 2019-02-13 VITALS — BP 128/78 | HR 82 | Temp 97.9°F | Resp 18 | Ht 76.5 in | Wt 290.8 lb

## 2019-02-13 DIAGNOSIS — E559 Vitamin D deficiency, unspecified: Secondary | ICD-10-CM

## 2019-02-13 DIAGNOSIS — I10 Essential (primary) hypertension: Secondary | ICD-10-CM | POA: Diagnosis not present

## 2019-02-13 DIAGNOSIS — Z1211 Encounter for screening for malignant neoplasm of colon: Secondary | ICD-10-CM

## 2019-02-13 DIAGNOSIS — R7303 Prediabetes: Secondary | ICD-10-CM

## 2019-02-13 DIAGNOSIS — Z0001 Encounter for general adult medical examination with abnormal findings: Secondary | ICD-10-CM

## 2019-02-13 DIAGNOSIS — Z8249 Family history of ischemic heart disease and other diseases of the circulatory system: Secondary | ICD-10-CM

## 2019-02-13 DIAGNOSIS — Z136 Encounter for screening for cardiovascular disorders: Secondary | ICD-10-CM

## 2019-02-13 DIAGNOSIS — Z79899 Other long term (current) drug therapy: Secondary | ICD-10-CM

## 2019-02-13 DIAGNOSIS — Z87891 Personal history of nicotine dependence: Secondary | ICD-10-CM

## 2019-02-13 DIAGNOSIS — R7309 Other abnormal glucose: Secondary | ICD-10-CM

## 2019-02-13 DIAGNOSIS — N138 Other obstructive and reflux uropathy: Secondary | ICD-10-CM

## 2019-02-13 DIAGNOSIS — K219 Gastro-esophageal reflux disease without esophagitis: Secondary | ICD-10-CM

## 2019-02-13 DIAGNOSIS — E782 Mixed hyperlipidemia: Secondary | ICD-10-CM

## 2019-02-13 DIAGNOSIS — M0579 Rheumatoid arthritis with rheumatoid factor of multiple sites without organ or systems involvement: Secondary | ICD-10-CM

## 2019-02-13 DIAGNOSIS — Z125 Encounter for screening for malignant neoplasm of prostate: Secondary | ICD-10-CM

## 2019-02-13 DIAGNOSIS — J449 Chronic obstructive pulmonary disease, unspecified: Secondary | ICD-10-CM

## 2019-02-13 DIAGNOSIS — Z0000 Encounter for general adult medical examination without abnormal findings: Secondary | ICD-10-CM

## 2019-02-14 ENCOUNTER — Other Ambulatory Visit: Payer: Self-pay | Admitting: Internal Medicine

## 2019-02-14 ENCOUNTER — Encounter: Payer: Self-pay | Admitting: Internal Medicine

## 2019-02-14 DIAGNOSIS — N411 Chronic prostatitis: Secondary | ICD-10-CM

## 2019-02-14 DIAGNOSIS — E782 Mixed hyperlipidemia: Secondary | ICD-10-CM

## 2019-02-14 LAB — CBC WITH DIFFERENTIAL/PLATELET
Absolute Monocytes: 410 cells/uL (ref 200–950)
Basophils Absolute: 33 cells/uL (ref 0–200)
Basophils Relative: 0.4 %
Eosinophils Absolute: 33 cells/uL (ref 15–500)
Eosinophils Relative: 0.4 %
HCT: 41.9 % (ref 38.5–50.0)
Hemoglobin: 14.1 g/dL (ref 13.2–17.1)
Lymphs Abs: 1706 cells/uL (ref 850–3900)
MCH: 31.4 pg (ref 27.0–33.0)
MCHC: 33.7 g/dL (ref 32.0–36.0)
MCV: 93.3 fL (ref 80.0–100.0)
MPV: 10.1 fL (ref 7.5–12.5)
Monocytes Relative: 5 %
Neutro Abs: 6019 cells/uL (ref 1500–7800)
Neutrophils Relative %: 73.4 %
Platelets: 247 10*3/uL (ref 140–400)
RBC: 4.49 10*6/uL (ref 4.20–5.80)
RDW: 13.6 % (ref 11.0–15.0)
Total Lymphocyte: 20.8 %
WBC: 8.2 10*3/uL (ref 3.8–10.8)

## 2019-02-14 LAB — URINALYSIS, ROUTINE W REFLEX MICROSCOPIC
Bacteria, UA: NONE SEEN /HPF
Bilirubin Urine: NEGATIVE
Glucose, UA: NEGATIVE
Hyaline Cast: NONE SEEN /LPF
Ketones, ur: NEGATIVE
Nitrite: POSITIVE — AB
Specific Gravity, Urine: 1.022 (ref 1.001–1.03)
pH: 5.5 (ref 5.0–8.0)

## 2019-02-14 LAB — COMPLETE METABOLIC PANEL WITH GFR
AG Ratio: 1.3 (calc) (ref 1.0–2.5)
ALT: 21 U/L (ref 9–46)
AST: 18 U/L (ref 10–35)
Albumin: 3.7 g/dL (ref 3.6–5.1)
Alkaline phosphatase (APISO): 81 U/L (ref 35–144)
BUN/Creatinine Ratio: 14 (calc) (ref 6–22)
BUN: 18 mg/dL (ref 7–25)
CO2: 28 mmol/L (ref 20–32)
Calcium: 9.5 mg/dL (ref 8.6–10.3)
Chloride: 104 mmol/L (ref 98–110)
Creat: 1.29 mg/dL — ABNORMAL HIGH (ref 0.70–1.11)
GFR, Est African American: 58 mL/min/{1.73_m2} — ABNORMAL LOW (ref >=60)
GFR, Est Non African American: 50 mL/min/{1.73_m2} — ABNORMAL LOW (ref >=60)
Globulin: 2.8 g/dL (calc) (ref 1.9–3.7)
Glucose, Bld: 141 mg/dL — ABNORMAL HIGH (ref 65–99)
Potassium: 4.2 mmol/L (ref 3.5–5.3)
Sodium: 143 mmol/L (ref 135–146)
Total Bilirubin: 0.7 mg/dL (ref 0.2–1.2)
Total Protein: 6.5 g/dL (ref 6.1–8.1)

## 2019-02-14 LAB — LIPID PANEL
Cholesterol: 187 mg/dL (ref <200)
HDL: 31 mg/dL — ABNORMAL LOW (ref >=40)
LDL Cholesterol (Calc): 115 mg/dL (calc) — ABNORMAL HIGH
Non-HDL Cholesterol (Calc): 156 mg/dL (calc) — ABNORMAL HIGH (ref <130)
Total CHOL/HDL Ratio: 6 (calc) — ABNORMAL HIGH (ref <5.0)
Triglycerides: 289 mg/dL — ABNORMAL HIGH (ref <150)

## 2019-02-14 LAB — HEMOGLOBIN A1C
Hgb A1c MFr Bld: 5.8 % of total Hgb — ABNORMAL HIGH (ref <5.7)
Mean Plasma Glucose: 120 (calc)
eAG (mmol/L): 6.6 (calc)

## 2019-02-14 LAB — MICROALBUMIN / CREATININE URINE RATIO
Creatinine, Urine: 230 mg/dL (ref 20–320)
Microalb Creat Ratio: 29 mcg/mg creat (ref <30)
Microalb, Ur: 6.7 mg/dL

## 2019-02-14 LAB — MAGNESIUM: Magnesium: 1.8 mg/dL (ref 1.5–2.5)

## 2019-02-14 LAB — INSULIN, RANDOM: Insulin: 127.4 u[IU]/mL — ABNORMAL HIGH

## 2019-02-14 LAB — TSH: TSH: 1.77 mIU/L (ref 0.40–4.50)

## 2019-02-14 LAB — VITAMIN D 25 HYDROXY (VIT D DEFICIENCY, FRACTURES): Vit D, 25-Hydroxy: >150 ng/mL — ABNORMAL HIGH (ref 30–100)

## 2019-02-14 LAB — PSA: PSA: 0.3 ng/mL (ref <=4.0)

## 2019-02-14 MED ORDER — SULFAMETHOXAZOLE-TRIMETHOPRIM 400-80 MG PO TABS: ORAL_TABLET | ORAL | 0 refills | Status: DC

## 2019-02-14 MED ORDER — ROSUVASTATIN CALCIUM 20 MG PO TABS: ORAL_TABLET | ORAL | 3 refills | Status: DC

## 2019-02-18 ENCOUNTER — Encounter: Payer: Self-pay | Admitting: *Deleted

## 2019-02-24 DIAGNOSIS — Z85828 Personal history of other malignant neoplasm of skin: Secondary | ICD-10-CM | POA: Diagnosis not present

## 2019-02-24 DIAGNOSIS — C44319 Basal cell carcinoma of skin of other parts of face: Secondary | ICD-10-CM | POA: Diagnosis not present

## 2019-02-24 DIAGNOSIS — C44622 Squamous cell carcinoma of skin of right upper limb, including shoulder: Secondary | ICD-10-CM | POA: Diagnosis not present

## 2019-02-24 DIAGNOSIS — L570 Actinic keratosis: Secondary | ICD-10-CM | POA: Diagnosis not present

## 2019-02-24 DIAGNOSIS — C4441 Basal cell carcinoma of skin of scalp and neck: Secondary | ICD-10-CM | POA: Diagnosis not present

## 2019-02-24 DIAGNOSIS — D225 Melanocytic nevi of trunk: Secondary | ICD-10-CM | POA: Diagnosis not present

## 2019-02-24 DIAGNOSIS — L820 Inflamed seborrheic keratosis: Secondary | ICD-10-CM | POA: Diagnosis not present

## 2019-02-24 DIAGNOSIS — D1801 Hemangioma of skin and subcutaneous tissue: Secondary | ICD-10-CM | POA: Diagnosis not present

## 2019-02-24 DIAGNOSIS — L821 Other seborrheic keratosis: Secondary | ICD-10-CM | POA: Diagnosis not present

## 2019-03-10 ENCOUNTER — Other Ambulatory Visit: Payer: Self-pay

## 2019-03-10 DIAGNOSIS — Z1211 Encounter for screening for malignant neoplasm of colon: Secondary | ICD-10-CM

## 2019-03-10 LAB — POC HEMOCCULT BLD/STL (HOME/3-CARD/SCREEN)
Card #2 Fecal Occult Blod, POC: NEGATIVE
Card #3 Fecal Occult Blood, POC: NEGATIVE
Fecal Occult Blood, POC: NEGATIVE

## 2019-03-11 DIAGNOSIS — Z1212 Encounter for screening for malignant neoplasm of rectum: Secondary | ICD-10-CM | POA: Diagnosis not present

## 2019-04-01 ENCOUNTER — Ambulatory Visit (INDEPENDENT_AMBULATORY_CARE_PROVIDER_SITE_OTHER): Payer: PPO

## 2019-04-01 ENCOUNTER — Other Ambulatory Visit: Payer: Self-pay

## 2019-04-01 VITALS — BP 138/78 | HR 97 | Temp 97.3°F | Wt 294.0 lb

## 2019-04-01 DIAGNOSIS — N411 Chronic prostatitis: Secondary | ICD-10-CM | POA: Diagnosis not present

## 2019-04-01 DIAGNOSIS — Z79899 Other long term (current) drug therapy: Secondary | ICD-10-CM

## 2019-04-01 NOTE — Progress Notes (Signed)
Patient presents to the office for a nurse visit to provide a urine sample from previous treatment for Prostatitis. Patient had completed his three weeks of antibiotics and states that he is not having any signs or symptoms of any reoccurrence. Vitals taken and recorded.

## 2019-04-03 LAB — URINALYSIS, ROUTINE W REFLEX MICROSCOPIC
Bacteria, UA: NONE SEEN /HPF
Bilirubin Urine: NEGATIVE
Glucose, UA: NEGATIVE
Hgb urine dipstick: NEGATIVE
Hyaline Cast: NONE SEEN /LPF
Ketones, ur: NEGATIVE
Nitrite: POSITIVE — AB
Specific Gravity, Urine: 1.025 (ref 1.001–1.03)
pH: 5.5 (ref 5.0–8.0)

## 2019-04-03 LAB — URINE CULTURE
MICRO NUMBER:: 1110386
SPECIMEN QUALITY:: ADEQUATE

## 2019-05-20 NOTE — Progress Notes (Signed)
FOLLOW UP  Assessment and Plan:   Asthma/COPD ([place]) COPD per imaging CXR 05/2017 Reports exertional dyspnea with ADLs, very limited Not on daily inhaler; given anoro to try x 1 week samples - will call to follow up  Consider neb medications if helpful but cost prohibitive   Hypertension Well controlled with current medications  Monitor blood pressure at home; patient to call if consistently greater than 130/80 Continue DASH diet.   Reminder to go to the ER if any CP, SOB, nausea, dizziness, severe HA, changes vision/speech, left arm numbness and tingling and jaw pain.  Cholesterol Currently above goal; newly on rosuvastatin 20 mg daily  Continue low cholesterol diet and exercise.  Check lipid panel.   Prediabetes Discussed disease and risks Discussed diet/exercise, weight management  A1C q43m; defer today; check CMP for serum glucose  Morbid obesity- BMI 35 with htn, hyperlipidemia, prediabetes Long discussion about weight loss, diet, and exercise Recommended diet heavy in fruits and veggies and low in animal meats, cheeses, and dairy products, appropriate calorie intake Discussed ideal weight for height  Patient will work on portions, check weight once a week and track progres Will follow up in 3 months  Vitamin D Def Above goal at last visit; he did decrease dose but unsure how he is taking  continue supplementation to maintain goal of 60-100 Check Vit D level  RF positive rheumatoid arthritis ([place]) Follows with rheumatology; continue prednisone; monitor   Exertional dyspnea 2-3 years per patient; quite severe, dyspnea with showering or walking in from car ? R/t asthma/COPD; will trial addition of daily inhaler ? Component of decompensation r/t age and very limited activity, consider rehab  Continue diet and meds as discussed. Further disposition pending results of labs. Discussed med's effects and SE's.   Over 30 minutes of exam, counseling, chart review, and  critical decision making was performed.   Future Appointments  Date Time Provider [place]  08/21/2019  2:30 PM Unk Pinto, MD [facility] None  09/09/2019 10:00 AM Garnet Sierras, NP [facility] None  03/09/2020  2:00 PM Unk Pinto, MD [facility] None    ----------------------------------------------------------------------------------------------------------------------  HPI 86 y.o. male  presents for 3 month follow up on hypertension, cholesterol, prediabetes, CKD III, weight and vitamin D deficiency. Patient also has hx/o IBS& GERD controlled with diet.   Patient has COPD per imaging (CXR 05/2017) and intermittent asthma predating 35+ years. He is on prednisone and PRN albuterol. Does report increased exertional dyspnea with even basic ADLs (taking a shower) over the last 2-3 years. He is not on a dialy inhaler, admits hasn't been using rescue inhaler as never perceived benefit with this.   Patient has RF+ Rheumatoid Arthritis (2010) followed by Dr Berna Bue. He is on prednisone 5 mg TID for RA and asthma.   BMI is Body mass index is 35.68 kg/m., he has not been working on diet and exercise Wt Readings from Last 3 Encounters:  05/21/19 297 lb (134.7 kg)  04/01/19 294 lb (133.4 kg)  02/13/19 290 lb 12.8 oz (131.9 kg)   He admits not checking BP at home, does have a cuff, today their BP is BP: (!) 142/70  He does not workout. He denies chest pain, dizziness, edema, PND. He does have ongoing exertional dyspnea with minimal activity (taking a shower). Dyspnea will recover quickly with rest.    He is on cholesterol medication Rosuvastatin 20 mg daily since last visit and denies myalgias. His cholesterol is not at goal. The cholesterol last  visit was:   Lab Results  Component Value Date   CHOL 187 02/13/2019   HDL 31 (L) 02/13/2019   LDLCALC 115 (H) 02/13/2019   TRIG 289 (H) 02/13/2019   CHOLHDL 6.0 (H) 02/13/2019    He has not been working on diet and exercise  for prediabetes, and denies increased appetite, nausea, paresthesia of the feet, polydipsia, polyuria and visual disturbances. Last A1C in the office was:  Lab Results  Component Value Date   HGBA1C 5.8 (H) 02/13/2019    He has CKD IIIa monitored at this office:  Lab Results  Component Value Date   GFRNONAA 50 (L) 02/13/2019   Patient is on Vitamin D supplement, has decreased dose but unsure how much he is taking Lab Results  Component Value Date   VD25OH >150 (H) 02/13/2019        Current Medications:  Current Outpatient Medications on File Prior to Visit  Medication Sig  . Albuterol Sulfate (PROAIR RESPICLICK) 123XX123 (90 Base) MCG/ACT AEPB Inhale 108 mcg into the lungs every 6 (six) hours as needed (wheezing/cough/shortness of breath).  . finasteride (PROSCAR) 5 MG tablet Take 1 tablet (5 mg total) by mouth daily.  Marland Kitchen losartan (COZAAR) 100 MG tablet Take 1 tablet (100 mg total) by mouth every morning.  . predniSONE (DELTASONE) 5 MG tablet Take 1 tablet 3 x / day or as directed for Asthma & Rheumatoid Arthritis  . terazosin (HYTRIN) 10 MG capsule Take 1 capsule (10 mg total) by mouth every morning.  . Vitamin D, Ergocalciferol, (DRISDOL) 1.25 MG (50000 UT) CAPS capsule Take 1 capsule by mouth every day or as directed  . Multiple Vitamin (MULTIVITAMIN) tablet Take 1 tablet by mouth daily.    No current facility-administered medications on file prior to visit.     Allergies:  Allergies  Allergen Reactions  . Ace Inhibitors   . Asa [Aspirin]     High Dose asprin  . Penicillins Hives  . Vasotec [Enalapril]     Cough  . Clindamycin/Lincomycin     Hives      Medical History:  Past Medical History:  Diagnosis Date  . Allergy   . Asthma   . BPH (benign prostatic hypertrophy)   . BPH (benign prostatic hypertrophy)   . COPD (chronic obstructive pulmonary disease) ([place])   . DJD (degenerative joint disease)   . GERD (gastroesophageal reflux disease)   . History of urinary  retention   . Hypertension   . IBS (irritable bowel syndrome)   . RBBB   . Rheumatoid arthritis(714.0)    DR. HAWKES  . Scrotal lesion   . Short of breath on exertion    Family history- Reviewed and unchanged Social history- Reviewed and unchanged   Review of Systems:  Review of Systems  Constitutional: Negative for malaise/fatigue and weight loss.  HENT: Negative for hearing loss and tinnitus.   Eyes: Negative for blurred vision and double vision.  Respiratory: Positive for shortness of breath (with minimum exertion, chronic unchanged 2-3 years). Negative for cough and wheezing.   Cardiovascular: Negative for chest pain, palpitations, orthopnea, claudication, leg swelling and PND.  Gastrointestinal: Negative for abdominal pain, blood in stool, constipation, diarrhea, heartburn, melena, nausea and vomiting.  Genitourinary: Negative.   Musculoskeletal: Positive for joint pain. Negative for falls and myalgias.  Skin: Negative for rash.  Neurological: Negative for dizziness, tingling, sensory change, weakness and headaches.  Endo/Heme/Allergies: Negative for polydipsia.  Psychiatric/Behavioral: Negative.   All other systems reviewed and are  negative.     Physical Exam: BP (!) 142/70   Pulse 81   Temp (!) 97.5 F (36.4 C)   Wt 297 lb (134.7 kg)   SpO2 97%   BMI 35.68 kg/m  Wt Readings from Last 3 Encounters:  05/21/19 297 lb (134.7 kg)  04/01/19 294 lb (133.4 kg)  02/13/19 290 lb 12.8 oz (131.9 kg)   General Appearance: Well nourished, well dressed male elder in no apparent distress. Eyes: PERRLA, EOMs, conjunctiva no swelling or erythema Sinuses: No Frontal/maxillary tenderness ENT/Mouth: Ext aud canals clear, TMs without erythema, bulging. Mask in place; oral exam deferred. Hearing normal.  Neck: Supple, thyroid normal.  Respiratory: Respiratory effort normal, BS equal bilaterally without rales, rhonchi, wheezing or stridor.  Cardio: RRR with no MRGs. Intact  peripheral pulses without edema.  Abdomen: Soft, obese abdomen + BS.  Non tender, no guarding, rebound, hernias, masses. Lymphatics: Non tender without lymphadenopathy.  Musculoskeletal: No obvious deformity or effusion, symmetrical 5/5 strength, slow steady gait Skin: Warm, dry without rashes, lesions, ecchymosis.  Neuro: Cranial nerves intact. No cerebellar symptoms. Sensation intact.  Psych: Awake and oriented X 3, normal affect, Insight and Judgment appropriate.    Izora Ribas, NP 4:14 PM [facility]

## 2019-05-21 ENCOUNTER — Other Ambulatory Visit: Payer: Self-pay

## 2019-05-21 ENCOUNTER — Encounter: Payer: Self-pay | Admitting: Adult Health

## 2019-05-21 ENCOUNTER — Ambulatory Visit (INDEPENDENT_AMBULATORY_CARE_PROVIDER_SITE_OTHER): Payer: PPO | Admitting: Adult Health

## 2019-05-21 VITALS — BP 142/70 | HR 81 | Temp 97.5°F | Wt 297.0 lb

## 2019-05-21 DIAGNOSIS — I10 Essential (primary) hypertension: Secondary | ICD-10-CM | POA: Diagnosis not present

## 2019-05-21 DIAGNOSIS — M0579 Rheumatoid arthritis with rheumatoid factor of multiple sites without organ or systems involvement: Secondary | ICD-10-CM

## 2019-05-21 DIAGNOSIS — E782 Mixed hyperlipidemia: Secondary | ICD-10-CM | POA: Diagnosis not present

## 2019-05-21 DIAGNOSIS — E559 Vitamin D deficiency, unspecified: Secondary | ICD-10-CM | POA: Diagnosis not present

## 2019-05-21 DIAGNOSIS — Z79899 Other long term (current) drug therapy: Secondary | ICD-10-CM | POA: Diagnosis not present

## 2019-05-21 DIAGNOSIS — J449 Chronic obstructive pulmonary disease, unspecified: Secondary | ICD-10-CM | POA: Diagnosis not present

## 2019-05-21 MED ORDER — ROSUVASTATIN CALCIUM 20 MG PO TABS: ORAL_TABLET | ORAL | 3 refills | Status: DC

## 2019-05-21 NOTE — Patient Instructions (Addendum)
Try anoro - 1 puff once daily in the morning - breathe in and hold 5 sec or as long as possible   See how your breathing does over the next week -   We will call you in 1 week to see how you are doing    Umeclidinium; Vilanterol inhalation powder What is this medicine? UMECLIDINIUM; VILANTEROL (ue MEK li DIN ee um; vye LAN ter ol) inhalation is a combination of two medicines that decrease inflammation and help to open up the airways of your lungs. It is for chronic obstructive pulmonary disease (COPD), including chronic bronchitis or emphysema. Do NOT use for asthma or an acute asthma attack. Do NOT use for a COPD attack. This medicine may be used for other purposes; ask your health care provider or pharmacist if you have questions. COMMON BRAND NAME(S): ANORO ELLIPTA What should I tell my health care provider before I take this medicine? They need to know if you have any of these conditions:  bladder problems or difficulty passing urine  diabetes  glaucoma  heart disease or irregular heartbeat  high blood pressure  kidney disease  pheochromocytoma  prostate disease  seizures  thyroid disease  an unusual or allergic reaction to umeclidinium, vilanterol, lactose, milk proteins, other medicines, foods, dyes, or preservatives  pregnant or trying to get pregnant  breast-feeding How should I use this medicine? This medicine is inhaled through the mouth. It is used once per day. Follow the directions on the prescription label. Do not use a spacer device with this inhaler. Take your medicine at regular intervals. Do not take your medicine more often than directed. Do not stop taking except on your doctor's advice. Make sure that you are using your inhaler correctly. Ask you doctor or health care provider if you have any questions. Talk to your pediatrician regarding the use of this medicine in children. Special care may be needed. Overdosage: If you think you have taken too much  of this medicine contact a poison control center or emergency room at once. NOTE: This medicine is only for you. Do not share this medicine with others. What if I miss a dose? If you miss a dose, use it as soon as you can. If it is almost time for your next dose, use only that dose and continue with your regular schedule. Do not use double or extra doses. What may interact with this medicine? Do not take this medicine with any of the following medications:  cisapride  dofetilide  dronedarone  MAOIs like Carbex, Eldepryl, Marplan, Nardil, and Parnate  pimozide  thioridazine  ziprasidone This medicine may also interact with the following medications:  antihistamines for allergy  antiviral medicines for HIV or AIDS  atropine  beta-blockers like metoprolol and propranolol  certain medicines for bladder problems like oxybutynin, tolterodine  certain medicines for depression, anxiety, or psychotic disturbances  certain medicines for Parkinson's disease like benztropine, trihexyphenidyl  certain medicines for stomach problems like dicyclomine, hyoscyamine  certain medicines for travel sickness like scopolamine  diuretics  ipratropium  medicines for colds  medicines for fungal infections like ketoconazole and itraconazole  other medicines for breathing problems  other medicines that prolong the QT interval (cause an abnormal heart rhythm)  tiotropium This list may not describe all possible interactions. Give your health care provider a list of all the medicines, herbs, non-prescription drugs, or dietary supplements you use. Also tell them if you smoke, drink alcohol, or use illegal drugs. Some items may interact  with your medicine. What should I watch for while using this medicine? Visit your doctor or health care professional for regular checkups. Tell your doctor or health care professional if your symptoms do not get better. If your symptoms get worse or if you need  your short-acting inhalers more often, call your doctor right away. Do not use this medicine more than once every 24 hours. What side effects may I notice from receiving this medicine? Side effects that you should report to your doctor or health care professional as soon as possible:  allergic reactions like skin rash or hives, swelling of the face, lips, or tongue  breathing problems right after inhaling your medicine  changes in vision  chest pain  eye pain  fast, irregular heartbeat  feeling faint or lightheaded, falls  fever or chills  nausea, vomiting  trouble passing urine or change in the amount of urine Side effects that usually do not require medical attention (report to your doctor or health care professional if they continue or are bothersome):  constipation  cough  diarrhea  headache  muscle cramps  nervousness  sore throat  tremor This list may not describe all possible side effects. Call your doctor for medical advice about side effects. You may report side effects to FDA at 1-800-FDA-1088. Where should I keep my medicine? Keep out of the reach of children. Store at room temperature between 15 and 30 degrees C (59 and 86 degrees F). Store in a dry place away from direct heat or sunlight. Throw away 6 weeks after you remove the inhaler from the foil tray, or after the dose indicator reads 0, whichever comes first. Throw away any unopened packages after the expiration date. NOTE: This sheet is a summary. It may not cover all possible information. If you have questions about this medicine, talk to your doctor, pharmacist, or health care provider.  2020 Elsevier/Gold Standard (2017-10-15 14:47:24)      Shortness of Breath, Adult Shortness of breath is when a person has trouble breathing enough air or when a person feels like she or he is having trouble breathing in enough air. Shortness of breath could be a sign of a medical problem. Follow these  instructions at home:   Pay attention to any changes in your symptoms.  Do not use any products that contain nicotine or tobacco, such as cigarettes, e-cigarettes, and chewing tobacco.  Do not smoke. Smoking is a common cause of shortness of breath. If you need help quitting, ask your health care provider.  Avoid things that can irritate your airways, such as: ? Mold. ? Dust. ? Air pollution. ? Chemical fumes. ? Things that can cause allergy symptoms (allergens), if you have allergies.  Keep your living space clean and free of mold and dust.  Rest as needed. Slowly return to your usual activities.  Take over-the-counter and prescription medicines only as told by your health care provider. This includes oxygen therapy and inhaled medicines.  Keep all follow-up visits as told by your health care provider. This is important. Contact a health care provider if:  Your condition does not improve as soon as expected.  You have a hard time doing your normal activities, even after you rest.  You have new symptoms. Get help right away if:  Your shortness of breath gets worse.  You have shortness of breath when you are resting.  You feel light-headed or you faint.  You have a cough that is not controlled with medicines.  You cough up blood.  You have pain with breathing.  You have pain in your chest, arms, shoulders, or abdomen.  You have a fever.  You cannot walk up stairs or exercise the way that you normally do. These symptoms may represent a serious problem that is an emergency. Do not wait to see if the symptoms will go away. Get medical help right away. Call your local emergency services (911 in the [place] Do not drive yourself to the hospital. Summary  Shortness of breath is when a person has trouble breathing enough air. It can be a sign of a medical problem.  Avoid things that irritate your lungs, such as smoking, pollution, mold, and dust.  Pay attention to  changes in your symptoms and contact your health care provider if you have a hard time completing daily activities because of shortness of breath. This information is not intended to replace advice given to you by your health care provider. Make sure you discuss any questions you have with your health care provider. Document Revised: 10/01/2017 Document Reviewed: 10/01/2017 Elsevier Patient Education  [place].

## 2019-05-22 LAB — COMPLETE METABOLIC PANEL WITH GFR
AG Ratio: 1.4 (calc) (ref 1.0–2.5)
ALT: 31 U/L (ref 9–46)
AST: 24 U/L (ref 10–35)
Albumin: 3.6 g/dL (ref 3.6–5.1)
Alkaline phosphatase (APISO): 81 U/L (ref 35–144)
BUN/Creatinine Ratio: 14 (calc) (ref 6–22)
BUN: 17 mg/dL (ref 7–25)
CO2: 31 mmol/L (ref 20–32)
Calcium: 9.2 mg/dL (ref 8.6–10.3)
Chloride: 106 mmol/L (ref 98–110)
Creat: 1.24 mg/dL — ABNORMAL HIGH (ref 0.70–1.11)
GFR, Est African American: 61 mL/min/{1.73_m2} (ref >=60)
GFR, Est Non African American: 52 mL/min/{1.73_m2} — ABNORMAL LOW (ref >=60)
Globulin: 2.6 g/dL (calc) (ref 1.9–3.7)
Glucose, Bld: 124 mg/dL — ABNORMAL HIGH (ref 65–99)
Potassium: 3.8 mmol/L (ref 3.5–5.3)
Sodium: 142 mmol/L (ref 135–146)
Total Bilirubin: 0.5 mg/dL (ref 0.2–1.2)
Total Protein: 6.2 g/dL (ref 6.1–8.1)

## 2019-05-22 LAB — VITAMIN D 25 HYDROXY (VIT D DEFICIENCY, FRACTURES): Vit D, 25-Hydroxy: 109 ng/mL — ABNORMAL HIGH (ref 30–100)

## 2019-05-22 LAB — CBC WITH DIFFERENTIAL/PLATELET
Absolute Monocytes: 590 cells/uL (ref 200–950)
Basophils Absolute: 26 cells/uL (ref 0–200)
Basophils Relative: 0.3 %
Eosinophils Absolute: 53 cells/uL (ref 15–500)
Eosinophils Relative: 0.6 %
HCT: 39.6 % (ref 38.5–50.0)
Hemoglobin: 13.3 g/dL (ref 13.2–17.1)
Lymphs Abs: 1910 cells/uL (ref 850–3900)
MCH: 31.9 pg (ref 27.0–33.0)
MCHC: 33.6 g/dL (ref 32.0–36.0)
MCV: 95 fL (ref 80.0–100.0)
MPV: 10.2 fL (ref 7.5–12.5)
Monocytes Relative: 6.7 %
Neutro Abs: 6222 cells/uL (ref 1500–7800)
Neutrophils Relative %: 70.7 %
Platelets: 219 10*3/uL (ref 140–400)
RBC: 4.17 10*6/uL — ABNORMAL LOW (ref 4.20–5.80)
RDW: 13.1 % (ref 11.0–15.0)
Total Lymphocyte: 21.7 %
WBC: 8.8 10*3/uL (ref 3.8–10.8)

## 2019-05-22 LAB — MAGNESIUM: Magnesium: 1.9 mg/dL (ref 1.5–2.5)

## 2019-05-22 LAB — LIPID PANEL
Cholesterol: 137 mg/dL (ref <200)
HDL: 35 mg/dL — ABNORMAL LOW (ref >=40)
LDL Cholesterol (Calc): 73 mg/dL (calc)
Non-HDL Cholesterol (Calc): 102 mg/dL (calc) (ref <130)
Total CHOL/HDL Ratio: 3.9 (calc) (ref <5.0)
Triglycerides: 202 mg/dL — ABNORMAL HIGH (ref <150)

## 2019-05-22 LAB — TSH: TSH: 1.41 mIU/L (ref 0.40–4.50)

## 2019-05-23 ENCOUNTER — Other Ambulatory Visit: Payer: Self-pay | Admitting: Internal Medicine

## 2019-05-23 DIAGNOSIS — J449 Chronic obstructive pulmonary disease, unspecified: Secondary | ICD-10-CM

## 2019-05-23 DIAGNOSIS — M0579 Rheumatoid arthritis with rheumatoid factor of multiple sites without organ or systems involvement: Secondary | ICD-10-CM

## 2019-05-23 MED ORDER — LOSARTAN POTASSIUM 100 MG PO TABS: ORAL_TABLET | ORAL | 3 refills | Status: DC

## 2019-05-23 MED ORDER — FINASTERIDE 5 MG PO TABS: ORAL_TABLET | ORAL | 3 refills | Status: DC

## 2019-05-23 MED ORDER — PREDNISONE 5 MG PO TABS: ORAL_TABLET | ORAL | 3 refills | Status: DC

## 2019-05-23 MED ORDER — TERAZOSIN HCL 10 MG PO CAPS: ORAL_CAPSULE | ORAL | 3 refills | Status: DC

## 2019-05-26 ENCOUNTER — Other Ambulatory Visit: Payer: Self-pay | Admitting: Adult Health

## 2019-05-26 DIAGNOSIS — R0609 Other forms of dyspnea: Secondary | ICD-10-CM

## 2019-05-26 DIAGNOSIS — J449 Chronic obstructive pulmonary disease, unspecified: Secondary | ICD-10-CM

## 2019-05-26 DIAGNOSIS — J45909 Unspecified asthma, uncomplicated: Secondary | ICD-10-CM

## 2019-05-27 ENCOUNTER — Ambulatory Visit: Payer: Medicare Other | Attending: Internal Medicine

## 2019-05-27 DIAGNOSIS — Z23 Encounter for immunization: Secondary | ICD-10-CM

## 2019-05-27 NOTE — Progress Notes (Signed)
   Covid-19 Vaccination Clinic  Name:  [name]    MRN: [id number] DOB: 11-Oct-1930  05/27/2019  [name] was observed post Covid-19 immunization for 15 minutes without incidence. He was provided with Vaccine Information Sheet and instruction to access the V-Safe system.   [name] was instructed to call 911 with any severe reactions post vaccine: Marland Kitchen Difficulty breathing  . Swelling of your face and throat  . A fast heartbeat  . A bad rash all over your body  . Dizziness and weakness    Immunizations Administered    Name Date Dose VIS Date Route   Pfizer COVID-19 Vaccine 05/27/2019 11:49 AM 0.3 mL 04/25/2019 Intramuscular   Manufacturer: [place]   Lot: [id number]   [place]: [id number]

## 2019-05-29 DIAGNOSIS — L570 Actinic keratosis: Secondary | ICD-10-CM | POA: Diagnosis not present

## 2019-05-29 DIAGNOSIS — Z85828 Personal history of other malignant neoplasm of skin: Secondary | ICD-10-CM | POA: Diagnosis not present

## 2019-05-29 DIAGNOSIS — L821 Other seborrheic keratosis: Secondary | ICD-10-CM | POA: Diagnosis not present

## 2019-05-29 DIAGNOSIS — C44319 Basal cell carcinoma of skin of other parts of face: Secondary | ICD-10-CM | POA: Diagnosis not present

## 2019-05-29 DIAGNOSIS — L820 Inflamed seborrheic keratosis: Secondary | ICD-10-CM | POA: Diagnosis not present

## 2019-05-29 DIAGNOSIS — D1801 Hemangioma of skin and subcutaneous tissue: Secondary | ICD-10-CM | POA: Diagnosis not present

## 2019-05-29 DIAGNOSIS — C44629 Squamous cell carcinoma of skin of left upper limb, including shoulder: Secondary | ICD-10-CM | POA: Diagnosis not present

## 2019-05-29 DIAGNOSIS — D225 Melanocytic nevi of trunk: Secondary | ICD-10-CM | POA: Diagnosis not present

## 2019-06-07 ENCOUNTER — Other Ambulatory Visit: Payer: Self-pay | Admitting: Internal Medicine

## 2019-06-07 DIAGNOSIS — J449 Chronic obstructive pulmonary disease, unspecified: Secondary | ICD-10-CM

## 2019-06-07 DIAGNOSIS — M0579 Rheumatoid arthritis with rheumatoid factor of multiple sites without organ or systems involvement: Secondary | ICD-10-CM

## 2019-06-07 MED ORDER — PREDNISONE 5 MG PO TABS: ORAL_TABLET | ORAL | 3 refills | Status: DC

## 2019-06-07 MED ORDER — TERAZOSIN HCL 10 MG PO CAPS: ORAL_CAPSULE | ORAL | 3 refills | Status: DC

## 2019-06-07 MED ORDER — LOSARTAN POTASSIUM 100 MG PO TABS: ORAL_TABLET | ORAL | 3 refills | Status: DC

## 2019-06-07 MED ORDER — FINASTERIDE 5 MG PO TABS: ORAL_TABLET | ORAL | 3 refills | Status: DC

## 2019-06-15 ENCOUNTER — Ambulatory Visit: Payer: PPO | Attending: Internal Medicine

## 2019-06-15 DIAGNOSIS — Z23 Encounter for immunization: Secondary | ICD-10-CM | POA: Insufficient documentation

## 2019-06-15 NOTE — Progress Notes (Signed)
   Covid-19 Vaccination Clinic  Name:  [name]    MRN: [id number] DOB: 12-22-1930  06/15/2019  [name] was observed post Covid-19 immunization for 15 minutes without incidence. He was provided with Vaccine Information Sheet and instruction to access the V-Safe system.   [name] was instructed to call 911 with any severe reactions post vaccine: Marland Kitchen Difficulty breathing  . Swelling of your face and throat  . A fast heartbeat  . A bad rash all over your body  . Dizziness and weakness    Immunizations Administered    Name Date Dose VIS Date Route   Pfizer COVID-19 Vaccine 06/15/2019 10:22 AM 0.3 mL 04/25/2019 Intramuscular   Manufacturer: [place]   Lot: [id number]   [place]: [id number]

## 2019-06-16 ENCOUNTER — Other Ambulatory Visit: Payer: Self-pay

## 2019-06-16 DIAGNOSIS — J449 Chronic obstructive pulmonary disease, unspecified: Secondary | ICD-10-CM

## 2019-06-16 DIAGNOSIS — E782 Mixed hyperlipidemia: Secondary | ICD-10-CM

## 2019-06-16 DIAGNOSIS — M0579 Rheumatoid arthritis with rheumatoid factor of multiple sites without organ or systems involvement: Secondary | ICD-10-CM

## 2019-06-16 MED ORDER — FINASTERIDE 5 MG PO TABS: ORAL_TABLET | ORAL | 3 refills | Status: DC

## 2019-06-16 MED ORDER — TERAZOSIN HCL 10 MG PO CAPS: ORAL_CAPSULE | ORAL | 3 refills | Status: DC

## 2019-06-16 MED ORDER — ROSUVASTATIN CALCIUM 20 MG PO TABS: ORAL_TABLET | ORAL | 3 refills | Status: DC

## 2019-06-16 MED ORDER — LOSARTAN POTASSIUM 100 MG PO TABS: ORAL_TABLET | ORAL | 3 refills | Status: DC

## 2019-06-16 MED ORDER — PREDNISONE 5 MG PO TABS: ORAL_TABLET | ORAL | 3 refills | Status: DC

## 2019-06-16 NOTE — Telephone Encounter (Signed)
Prescriptions sent to the wrong pharmacy. Please resend to [facility] order.

## 2019-08-21 ENCOUNTER — Ambulatory Visit: Payer: PPO | Admitting: Internal Medicine

## 2019-08-27 DIAGNOSIS — D2261 Melanocytic nevi of right upper limb, including shoulder: Secondary | ICD-10-CM | POA: Diagnosis not present

## 2019-08-27 DIAGNOSIS — L821 Other seborrheic keratosis: Secondary | ICD-10-CM | POA: Diagnosis not present

## 2019-08-27 DIAGNOSIS — L570 Actinic keratosis: Secondary | ICD-10-CM | POA: Diagnosis not present

## 2019-08-27 DIAGNOSIS — L820 Inflamed seborrheic keratosis: Secondary | ICD-10-CM | POA: Diagnosis not present

## 2019-08-27 DIAGNOSIS — D1801 Hemangioma of skin and subcutaneous tissue: Secondary | ICD-10-CM | POA: Diagnosis not present

## 2019-08-27 DIAGNOSIS — Z85828 Personal history of other malignant neoplasm of skin: Secondary | ICD-10-CM | POA: Diagnosis not present

## 2019-08-27 DIAGNOSIS — L814 Other melanin hyperpigmentation: Secondary | ICD-10-CM | POA: Diagnosis not present

## 2019-09-08 ENCOUNTER — Encounter: Payer: PPO | Admitting: Internal Medicine

## 2019-09-09 ENCOUNTER — Ambulatory Visit: Payer: PPO | Admitting: Adult Health Nurse Practitioner

## 2019-09-10 ENCOUNTER — Ambulatory Visit (INDEPENDENT_AMBULATORY_CARE_PROVIDER_SITE_OTHER): Payer: PPO

## 2019-09-10 ENCOUNTER — Ambulatory Visit: Payer: PPO | Admitting: Emergency Medicine

## 2019-09-10 ENCOUNTER — Encounter: Payer: Self-pay | Admitting: Emergency Medicine

## 2019-09-10 ENCOUNTER — Other Ambulatory Visit: Payer: Self-pay

## 2019-09-10 VITALS — BP 152/86 | HR 98 | Temp 98.1°F | Ht 76.0 in | Wt 301.0 lb

## 2019-09-10 DIAGNOSIS — R0600 Dyspnea, unspecified: Secondary | ICD-10-CM | POA: Diagnosis not present

## 2019-09-10 NOTE — Progress Notes (Signed)
Subjective:    Patient ID: [name], male    DOB: 22-Jun-1930, 86 y.o.   MRN: [id number]  HPI 86 year old man with a history of rheumatoid arthritis, former tobacco (30 pack years), GERD, allergic rhinitis.  He carries a history of COPD/asthma that was made around 2013 - I can't find PFT.  He is on chronic prednisone 5 mg daily and has been taking this for about 8 years.  He is referred today for evaluation of slow progressive exertional dyspnea.  He describes decrease in functional capacity over thew last year - gets SOB w walking 50 feet. He has gained wt over the last year, not sure how much. He does not cough, does hear some wheeze when laying down. Unsure whether he snores.  He has albuterol available to use as needed but does not use it.   Review of Systems  Past Medical History:  Diagnosis Date  . Allergy   . Asthma   . BPH (benign prostatic hypertrophy)   . BPH (benign prostatic hypertrophy)   . COPD (chronic obstructive pulmonary disease) ([place])   . DJD (degenerative joint disease)   . GERD (gastroesophageal reflux disease)   . History of urinary retention   . Hypertension   . IBS (irritable bowel syndrome)   . RBBB   . Rheumatoid arthritis(714.0)    DR. HAWKES  . Scrotal lesion   . Short of breath on exertion      Family History  Problem Relation Age of Onset  . Heart disease Father   . Colon cancer Father   . Hypertension Father   . Heart disease Brother   . Diabetes Sister      Social History   Socioeconomic History  . Marital status: Married    Spouse name: Not on file  . Number of children: Not on file  . Years of education: Not on file  . Highest education level: Not on file  Occupational History  . Occupation: RETIRED    Employer: RETIRED  Tobacco Use  . Smoking status: Former Smoker    Packs/day: 1.00    Years: 30.00    Pack years: 30.00    Types: Cigarettes    Quit date: 07/11/1980    Years since quitting: 39.1  . Smokeless tobacco:  Former Systems developer    Types: Chew  Substance and Sexual Activity  . Alcohol use: No  . Drug use: No  . Sexual activity: Not on file  Other Topics Concern  . Not on file  Social History Narrative  . Not on file   Social Determinants of Health   Financial Resource Strain:   . Difficulty of Paying Living Expenses:   Food Insecurity:   . Worried About Charity fundraiser in the Last Year:   . Arboriculturist in the Last Year:   Transportation Needs:   . Film/video editor (Medical):   Marland Kitchen Lack of Transportation (Non-Medical):   Physical Activity:   . Days of Exercise per Week:   . Minutes of Exercise per Session:   Stress:   . Feeling of Stress :   Social Connections:   . Frequency of Communication with Friends and Family:   . Frequency of Social Gatherings with Friends and Family:   . Attends Religious Services:   . Active Member of Clubs or Organizations:   . Attends Archivist Meetings:   Marland Kitchen Marital Status:   Intimate Partner Violence:   . Fear of Current  or Ex-Partner:   . Emotionally Abused:   Marland Kitchen Physically Abused:   . 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   Allergies  Allergen Reactions  . Ace Inhibitors   . Asa [Aspirin]     High Dose asprin  . Penicillins Hives  . Vasotec [Enalapril]     Cough  . Clindamycin/Lincomycin     Hives      Outpatient Medications Prior to Visit  Medication Sig Dispense Refill  . finasteride (PROSCAR) 5 MG tablet Take 1 tablet Daily for Prostate 90 tablet 3  . losartan (COZAAR) 100 MG tablet Take 1 tablet Daily for BP 90 tablet 3  . Multiple Vitamin (MULTIVITAMIN) tablet Take 1 tablet by mouth daily.     . predniSONE (DELTASONE) 5 MG tablet Take 1 tablet 3 x / day or as directed for Asthma & Rheumatoid Arthritis 270 tablet 3  . rosuvastatin (CRESTOR) 20 MG tablet Take 1 tablet daily for Cholesterol 90 tablet 3  . terazosin (HYTRIN) 10 MG capsule Take 1 capsule at Bedtime for Prostate  90 capsule 3  . Vitamin D, Ergocalciferol, (DRISDOL) 1.25 MG (50000 UT) CAPS capsule Take 1 capsule by mouth every day or as directed 90 capsule 0  . Albuterol Sulfate (PROAIR RESPICLICK) 123XX123 (90 Base) MCG/ACT AEPB Inhale 108 mcg into the lungs every 6 (six) hours as needed (wheezing/cough/shortness of breath). (Patient not taking: Reported on 09/10/2019) 1 each 3   No facility-administered medications prior to visit.        Objective:   Physical Exam Vitals:   09/10/19 1338  BP: (!) 152/86  Pulse: 98  Temp: 98.1 F (36.7 C)  TempSrc: Temporal  SpO2: 94%  Weight: (!) 301 lb (136.5 kg)  Height: 6\' 4"  (1.93 m)   Gen: Pleasant, obese man, in no distress,  normal affect  ENT: No lesions,  mouth clear,  oropharynx clear, no postnasal drip  Neck: No JVD, no stridor  Lungs: No use of accessory muscles, no crackles or wheezing on normal respiration, no wheeze on forced expiration  Cardiovascular: RRR, heart sounds normal, no murmur or gallops, 1+ ankle B peripheral edema  Musculoskeletal: No deformities, no cyanosis or clubbing  Neuro: alert, awake, somewhat poor history giver, non-focal  Skin: Warm, no lesions or rash       Assessment & Plan:  Dyspnea Suspect that this is multifactorial.  He carries a remote diagnosis of COPD/asthma, is uncertain about whether bronchodilators have ever helped him.  Suspect based on his obesity he has restrictive lung disease as well, some degree of deconditioning.  His daughter confirms today that he is not very active and that is gained weight.  He is also at risk for interstitial disease, PAH due to his rheumatoid arthritis.  He is on chronic immunosuppression, consider opportunistic infection although he denies any cough, sputum production. Start the evaluation with chest x-ray, consider CT chest going forward depending on results.  Repeat pulmonary function testing to define his degree of obstruction, restriction.  Echocardiogram to evaluate  LV function, diastolic function, possible pulmonary hypertension.  Walking oximetry today to ensure no evidence of occult desaturation.  Walking oximetry on room air today. We will perform pulmonary function testing in next office visit Chest x-ray today Follow with Dr. Lamonte Sakai next available with full pulmonary function testing on the same day.  Baltazar Apo, MD, PhD 09/10/2019, 2:14 PM [facility] [phone] or if no answer [phone]

## 2019-09-10 NOTE — Patient Instructions (Signed)
Walking oximetry on room air today. We will perform pulmonary function testing in next office visit Chest x-ray today Follow with Dr. Lamonte Sakai next available with full pulmonary function testing on the same day.

## 2019-09-10 NOTE — Assessment & Plan Note (Signed)
Suspect that this is multifactorial.  He carries a remote diagnosis of COPD/asthma, is uncertain about whether bronchodilators have ever helped him.  Suspect based on his obesity he has restrictive lung disease as well, some degree of deconditioning.  His daughter confirms today that he is not very active and that is gained weight.  He is also at risk for interstitial disease, PAH due to his rheumatoid arthritis.  He is on chronic immunosuppression, consider opportunistic infection although he denies any cough, sputum production. Start the evaluation with chest x-ray, consider CT chest going forward depending on results.  Repeat pulmonary function testing to define his degree of obstruction, restriction.  Echocardiogram to evaluate LV function, diastolic function, possible pulmonary hypertension.  Walking oximetry today to ensure no evidence of occult desaturation.  Walking oximetry on room air today. We will perform pulmonary function testing in next office visit Chest x-ray today Follow with Dr. Lamonte Sakai next available with full pulmonary function testing on the same day.

## 2019-09-12 NOTE — Progress Notes (Signed)
Patient identification verified. Normal chest x ray results reviewed per Dr. Lamonte Sakai.

## 2019-10-09 ENCOUNTER — Other Ambulatory Visit (HOSPITAL_COMMUNITY): Payer: PPO

## 2019-10-09 ENCOUNTER — Telehealth: Payer: Self-pay | Admitting: Emergency Medicine

## 2019-10-09 NOTE — Telephone Encounter (Signed)
PCC's can you please work on this and see if the ECHO can be done somewhere else sooner? Thanks!

## 2019-10-10 NOTE — Telephone Encounter (Addendum)
[facility] doesn't have another opening until 6/16.  I have sent Butch Penny a message to see if she can see if another [facility] office has earlier appt.  I have also called [facility] & left vm to see if they can do echo next week or the morning of 6/7 prior to covid test.

## 2019-10-10 NOTE — Telephone Encounter (Addendum)
Left vm for [name] who schedules echos to call me back next week on 6/1 when she returns to office.  Sent teams message to [name] in cardiology to see if they have earlier opening for echo or if ok for pt to come there during self quarantine period.

## 2019-10-10 NOTE — Telephone Encounter (Signed)
Have pt scheduled at [facility] for echo on 6/3 at 2:00.  Gave appt info to pt.  Nothing further needed.

## 2019-10-16 ENCOUNTER — Ambulatory Visit (HOSPITAL_COMMUNITY)
Admission: RE | Admit: 2019-10-16 | Discharge: 2019-10-16 | Disposition: A | Discharge status: Home / Self Care | Payer: PPO | Source: Ambulatory Visit | Attending: Emergency Medicine | Admitting: Emergency Medicine

## 2019-10-16 ENCOUNTER — Other Ambulatory Visit: Payer: Self-pay

## 2019-10-16 DIAGNOSIS — E119 Type 2 diabetes mellitus without complications: Secondary | ICD-10-CM | POA: Insufficient documentation

## 2019-10-16 DIAGNOSIS — R0600 Dyspnea, unspecified: Secondary | ICD-10-CM | POA: Diagnosis not present

## 2019-10-16 DIAGNOSIS — J449 Chronic obstructive pulmonary disease, unspecified: Secondary | ICD-10-CM | POA: Diagnosis not present

## 2019-10-16 NOTE — Progress Notes (Signed)
  Echocardiogram 2D Echocardiogram has been performed.  [name] 10/16/2019, 2:23 PM

## 2019-10-20 ENCOUNTER — Other Ambulatory Visit (HOSPITAL_COMMUNITY)
Admission: RE | Admit: 2019-10-20 | Discharge: 2019-10-20 | Disposition: A | Discharge status: Home / Self Care | Payer: PPO | Source: Ambulatory Visit | Attending: Emergency Medicine | Admitting: Emergency Medicine

## 2019-10-20 DIAGNOSIS — Z01812 Encounter for preprocedural laboratory examination: Secondary | ICD-10-CM | POA: Insufficient documentation

## 2019-10-20 DIAGNOSIS — Z20822 Contact with and (suspected) exposure to covid-19: Secondary | ICD-10-CM | POA: Diagnosis not present

## 2019-10-21 LAB — SARS CORONAVIRUS 2 (TAT 6-24 HRS): SARS Coronavirus 2: NEGATIVE

## 2019-10-22 ENCOUNTER — Other Ambulatory Visit (HOSPITAL_COMMUNITY): Payer: PPO

## 2019-10-23 ENCOUNTER — Ambulatory Visit (INDEPENDENT_AMBULATORY_CARE_PROVIDER_SITE_OTHER): Payer: PPO | Admitting: Emergency Medicine

## 2019-10-23 ENCOUNTER — Encounter: Payer: Self-pay | Admitting: Emergency Medicine

## 2019-10-23 ENCOUNTER — Other Ambulatory Visit: Payer: Self-pay

## 2019-10-23 ENCOUNTER — Ambulatory Visit: Payer: PPO | Admitting: Emergency Medicine

## 2019-10-23 DIAGNOSIS — R0600 Dyspnea, unspecified: Secondary | ICD-10-CM

## 2019-10-23 DIAGNOSIS — R0602 Shortness of breath: Secondary | ICD-10-CM

## 2019-10-23 LAB — PULMONARY FUNCTION TEST
DL/VA % pred: 100 %
DL/VA: 3.73 ml/min/mmHg/L
DLCO cor % pred: 91 %
DLCO cor: 24.93 ml/min/mmHg
DLCO unc % pred: 91 %
DLCO unc: 24.93 ml/min/mmHg
FEF 25-75 Post: 3.41 L/sec
FEF 25-75 Pre: 2 L/sec
FEF2575-%Change-Post: 70 %
FEF2575-%Pred-Post: 157 %
FEF2575-%Pred-Pre: 92 %
FEV1-%Change-Post: 16 %
FEV1-%Pred-Post: 94 %
FEV1-%Pred-Pre: 81 %
FEV1-Post: 3.1 L
FEV1-Pre: 2.67 L
FEV1FVC-%Change-Post: 2 %
FEV1FVC-%Pred-Pre: 104 %
FEV6-%Change-Post: 13 %
FEV6-%Pred-Post: 94 %
FEV6-%Pred-Pre: 83 %
FEV6-Post: 4.11 L
FEV6-Pre: 3.61 L
FEV6FVC-%Change-Post: 0 %
FEV6FVC-%Pred-Post: 106 %
FEV6FVC-%Pred-Pre: 106 %
FVC-%Change-Post: 13 %
FVC-%Pred-Post: 88 %
FVC-%Pred-Pre: 78 %
FVC-Post: 4.11 L
FVC-Pre: 3.63 L
Post FEV1/FVC ratio: 75 %
Post FEV6/FVC ratio: 100 %
Pre FEV1/FVC ratio: 74 %
Pre FEV6/FVC Ratio: 99 %
RV % pred: 132 %
RV: 4.04 L
TLC % pred: 98 %
TLC: 7.93 L

## 2019-10-23 MED ORDER — BUDESONIDE-FORMOTEROL FUMARATE 80-4.5 MCG/ACT IN AERO
2.0000 | INHALATION_SPRAY | Freq: Two times a day (BID) | RESPIRATORY_TRACT | 2 refills | Status: DC
Start: 2019-10-23 — End: 2021-01-25

## 2019-10-23 NOTE — Patient Instructions (Signed)
Based on her pulmonary function testing we will try starting an every day schedule inhaled medication Please start Symbicort 80/4.5 mcg, 2 puffs twice a day.  Remember to rinse and gargle after using. Start working on increasing your daily exercise and conditioning.  This is also going to help your breathing. Your chest x-ray from our initial visit was normal Your echocardiogram is reassuring, shows intact heart muscle strength.  There is some evidence for grade 1 diastolic dysfunction or impaired filling. Follow with Dr Lamonte Sakai in 3 months or sooner if you have any problems.

## 2019-10-23 NOTE — Progress Notes (Signed)
Subjective:    Patient ID: [name], male    DOB: 1930-08-31, 86 y.o.   MRN: [id number]  HPI 86 year old man with a history of rheumatoid arthritis, former tobacco (30 pack years), GERD, allergic rhinitis.  He carries a history of COPD/asthma that was made around 2013 - I can't find PFT.  He is on chronic prednisone 5 mg daily and has been taking this for about 8 years.  He is referred today for evaluation of slow progressive exertional dyspnea.  He describes decrease in functional capacity over thew last year - gets SOB w walking 50 feet. He has gained wt over the last year, not sure how much. He does not cough, does hear some wheeze when laying down. Unsure whether he snores.  He has albuterol available to use as needed but does not use it.  ROV 10/23/19 --follow-up visit for 86 year old obese man, former smoker, with RA allergic rhinitis and GERD.  Carries a diagnosis of COPD/asthma and has been on chronic prednisone 5 mg daily.  In his initial visit he did not desaturate with ambulation, only completed one lap.  Chest x-ray done 09/11/2019 reviewed by me, shows no infiltrates or any abnormalities.  He underwent pulmonary function testing today, reviewed by me, showed spirometry evidence for mixed obstruction and restriction with obstruction confirmed by positive bronchodilator response.  His lung volumes are normal.  Diffusion capacity normal  Echocardiogram done on 10/16/2019 reviewed, Shows normal LV systolic function with grade 1 diastolic dysfunction, normal right ventricular size and function.  Normal PASP    Review of Systems As per HPI  Past Medical History:  Diagnosis Date   Allergy    Asthma    BPH (benign prostatic hypertrophy)    BPH (benign prostatic hypertrophy)    COPD (chronic obstructive pulmonary disease) (HCC)    DJD (degenerative joint disease)    GERD (gastroesophageal reflux disease)    History of urinary retention    Hypertension    IBS  (irritable bowel syndrome)    RBBB    Rheumatoid arthritis(714.0)    DR. HAWKES   Scrotal lesion    Short of breath on exertion      Family History  Problem Relation Age of Onset   Heart disease Father    Colon cancer Father    Hypertension Father    Heart disease Brother    Diabetes Sister      Social History   Socioeconomic History   Marital status: Married    Spouse name: Not on file   Number of children: Not on file   Years of education: Not on file   Highest education level: Not on file  Occupational History   Occupation: RETIRED    Employer: RETIRED  Tobacco Use   Smoking status: Former Smoker    Packs/day: 1.00    Years: 30.00    Pack years: 30.00    Types: Cigarettes    Quit date: 07/11/1980    Years since quitting: 39.3   Smokeless tobacco: Former Systems developer    Types: Chew  Substance and Sexual Activity   Alcohol use: No   Drug use: No   Sexual activity: Not on file  Other Topics Concern   Not on file  Social History Narrative   Not on file   Social Determinants of Health   Financial Resource Strain:    Difficulty of Paying Living Expenses:   Food Insecurity:    Worried About Charity fundraiser in the  Last Year:    Arboriculturist in the Last Year:   Transportation Needs:    Film/video editor (Medical):    Lack of Transportation (Non-Medical):   Physical Activity:    Days of Exercise per Week:    Minutes of Exercise per Session:   Stress:    Feeling of Stress :   Social Connections:    Frequency of Communication with Friends and Family:    Frequency of Social Gatherings with Friends and Family:    Attends Religious Services:    Active Member of Clubs or Organizations:    Attends Archivist Meetings:    Marital Status:   Intimate Partner Violence:    Fear of Current or Ex-Partner:    Emotionally Abused:    Physically Abused:    Sexually Abused:     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   Allergies  Allergen Reactions   Ace Inhibitors    Asa [Aspirin]     High Dose asprin   Penicillins Hives   Vasotec [Enalapril]     Cough   Clindamycin/Lincomycin     Hives      Outpatient Medications Prior to Visit  Medication Sig Dispense Refill   finasteride (PROSCAR) 5 MG tablet Take 1 tablet Daily for Prostate 90 tablet 3   losartan (COZAAR) 100 MG tablet Take 1 tablet Daily for BP 90 tablet 3   Multiple Vitamin (MULTIVITAMIN) tablet Take 1 tablet by mouth daily.      predniSONE (DELTASONE) 5 MG tablet Take 1 tablet 3 x / day or as directed for Asthma & Rheumatoid Arthritis 270 tablet 3   rosuvastatin (CRESTOR) 20 MG tablet Take 1 tablet daily for Cholesterol 90 tablet 3   terazosin (HYTRIN) 10 MG capsule Take 1 capsule at Bedtime for Prostate 90 capsule 3   Vitamin D, Ergocalciferol, (DRISDOL) 1.25 MG (50000 UT) CAPS capsule Take 1 capsule by mouth every day or as directed 90 capsule 0   Albuterol Sulfate (PROAIR RESPICLICK) 169 (90 Base) MCG/ACT AEPB Inhale 108 mcg into the lungs every 6 (six) hours as needed (wheezing/cough/shortness of breath). (Patient not taking: Reported on 09/10/2019) 1 each 3   No facility-administered medications prior to visit.        Objective:   Physical Exam Vitals:   10/23/19 1338  BP: 136/64  Pulse: 85  Temp: 97.9 F (36.6 C)  TempSrc: Oral  SpO2: 96%  Weight: 295 lb (133.8 kg)  Height: 6\' 3"  (1.905 m)   Gen: Pleasant, obese man, in no distress,  normal affect  ENT: No lesions,  mouth clear,  oropharynx clear, no postnasal drip  Neck: No JVD, no stridor  Lungs: No use of accessory muscles, no crackles or wheezing on normal respiration, no wheeze on forced expiration  Cardiovascular: RRR, heart sounds normal, no murmur or gallops, 1+ ankle B peripheral edema  Musculoskeletal: No deformities, no cyanosis or clubbing  Neuro: alert, awake, somewhat poor history giver, non-focal  Skin: Warm, no  lesions or rash       Assessment & Plan:  Dyspnea No evidence of infection or interstitial disease on chest x-ray.  Echocardiogram reassuring with only some diastolic dysfunction present.  No pulmonary hypertension.  His pulmonary function testing shows mild mixed obstruction and restriction.  Positive bronchodilator response.  I think it would be reasonable to undertake a trial of Symbicort to see if he benefits.  We will order this and then he will  notify me whether he benefits and wants to continue.  Follow-up in 3 months.  Baltazar Apo, MD, PhD 10/23/2019, 2:01 PM [facility] [phone] or if no answer [phone]

## 2019-10-23 NOTE — Assessment & Plan Note (Signed)
No evidence of infection or interstitial disease on chest x-ray.  Echocardiogram reassuring with only some diastolic dysfunction present.  No pulmonary hypertension.  His pulmonary function testing shows mild mixed obstruction and restriction.  Positive bronchodilator response.  I think it would be reasonable to undertake a trial of Symbicort to see if he benefits.  We will order this and then he will notify me whether he benefits and wants to continue.  Follow-up in 3 months.

## 2019-10-23 NOTE — Addendum Note (Signed)
Addended by: Gavin Potters R on: 10/23/2019 02:06 PM   Modules accepted: Orders

## 2019-10-23 NOTE — Progress Notes (Signed)
Full PFT performed today. °

## 2019-10-30 ENCOUNTER — Encounter: Payer: PPO | Admitting: Internal Medicine

## 2019-11-09 IMAGING — CR DG CHEST 2V
3 series · 3 of 3 positions shown · non-contrast
Comparison: 12/21/2015

CLINICAL DATA: Shortness of breath for several months, cough,
dyspnea, fatigue, history asthma, COPD, former smoker

EXAM:
CHEST  2 VIEW

[chest pa (1 of 2)]
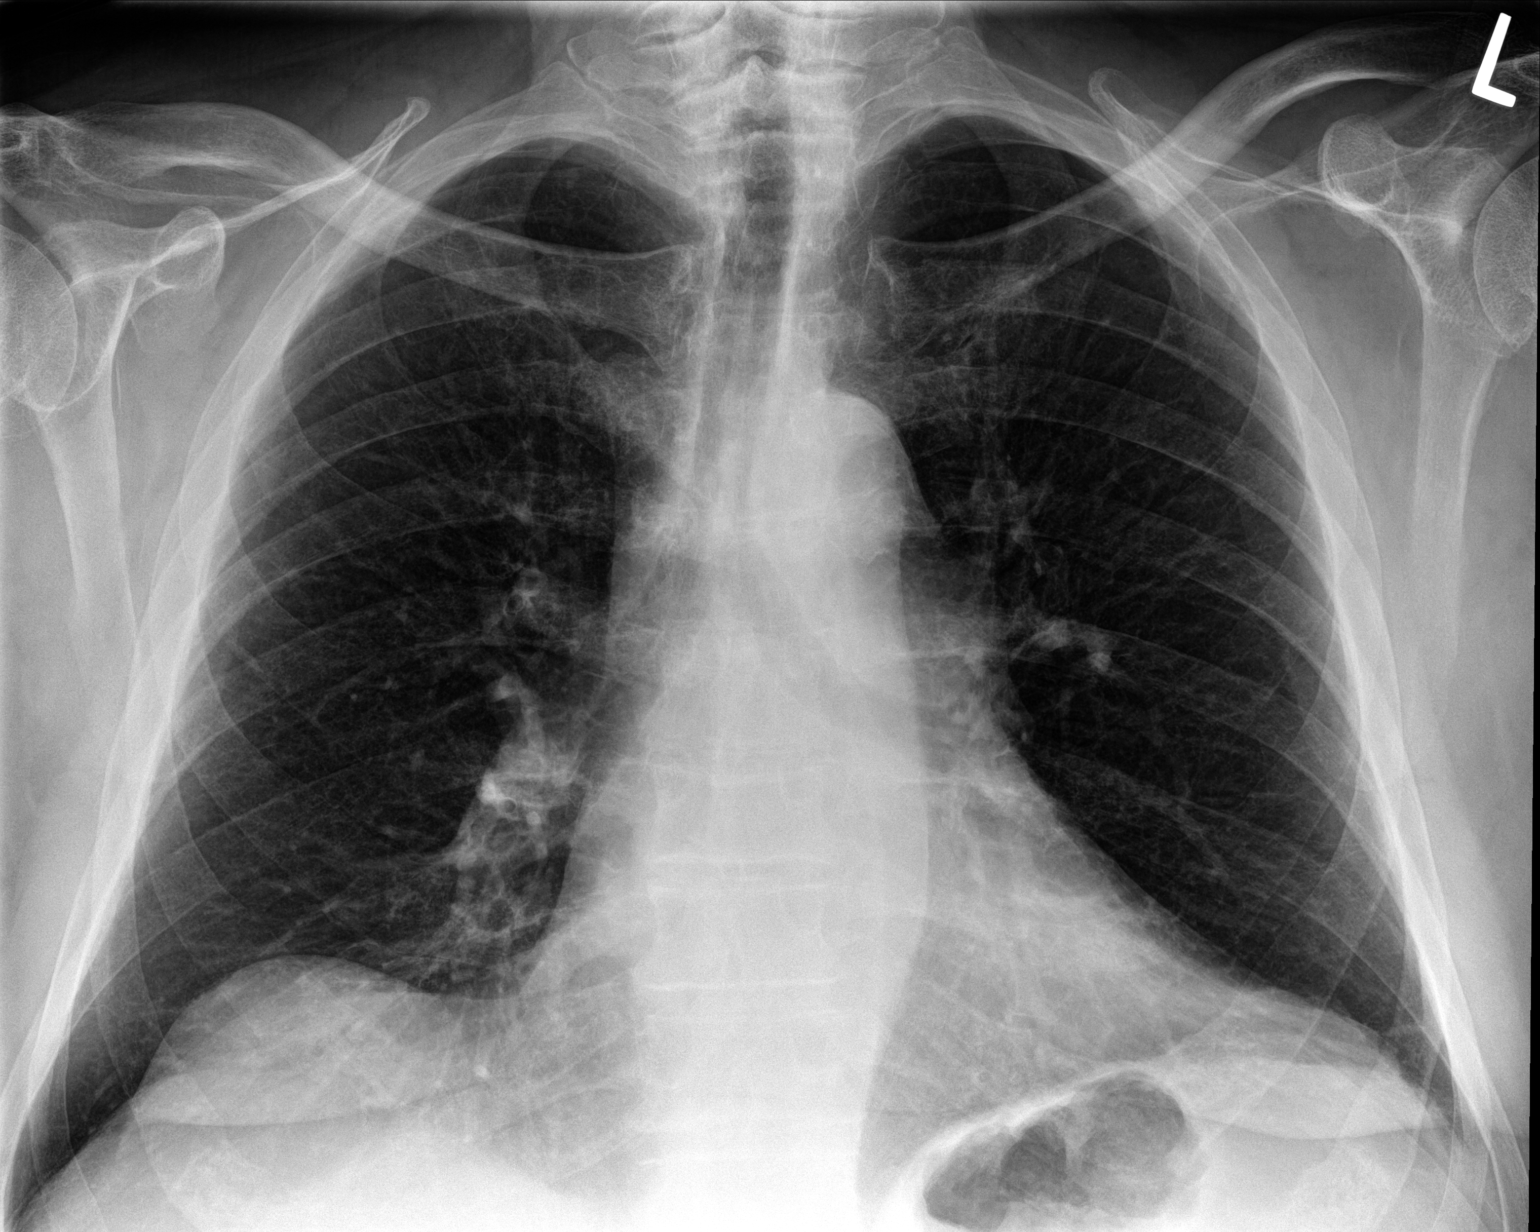

[chest lat]
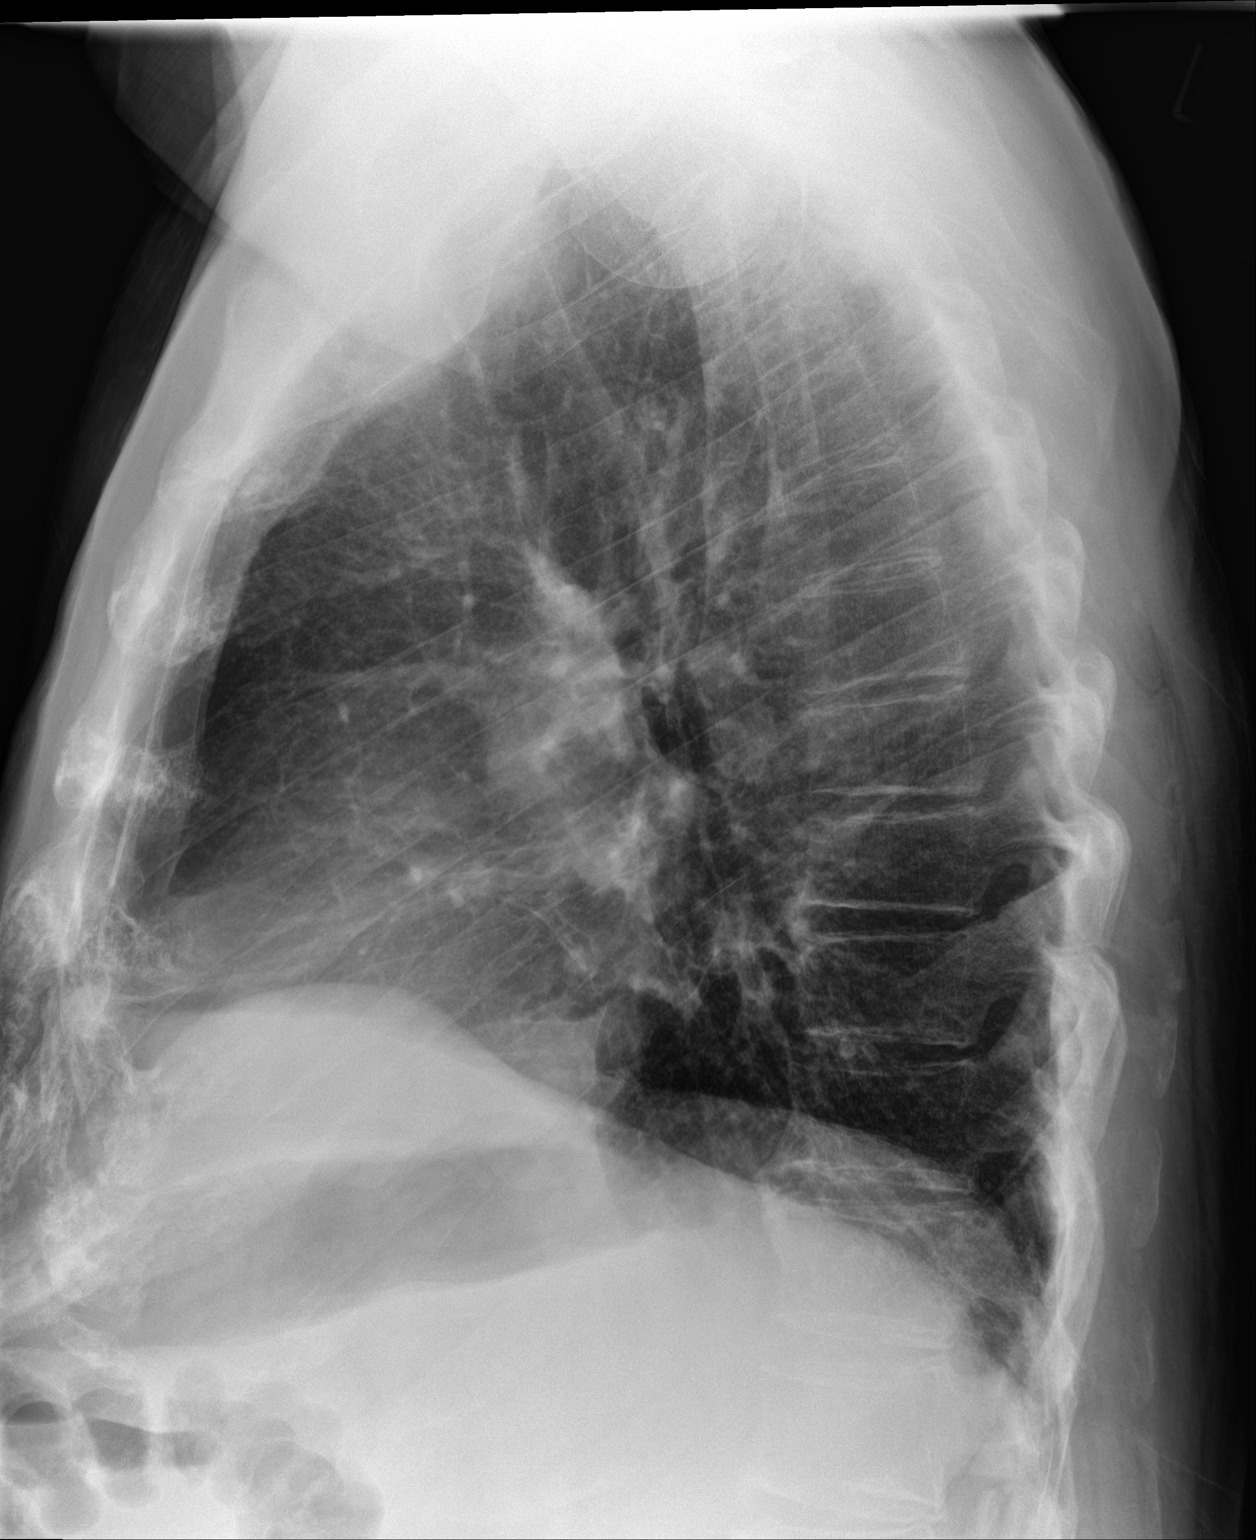

[chest pa (2 of 2)]
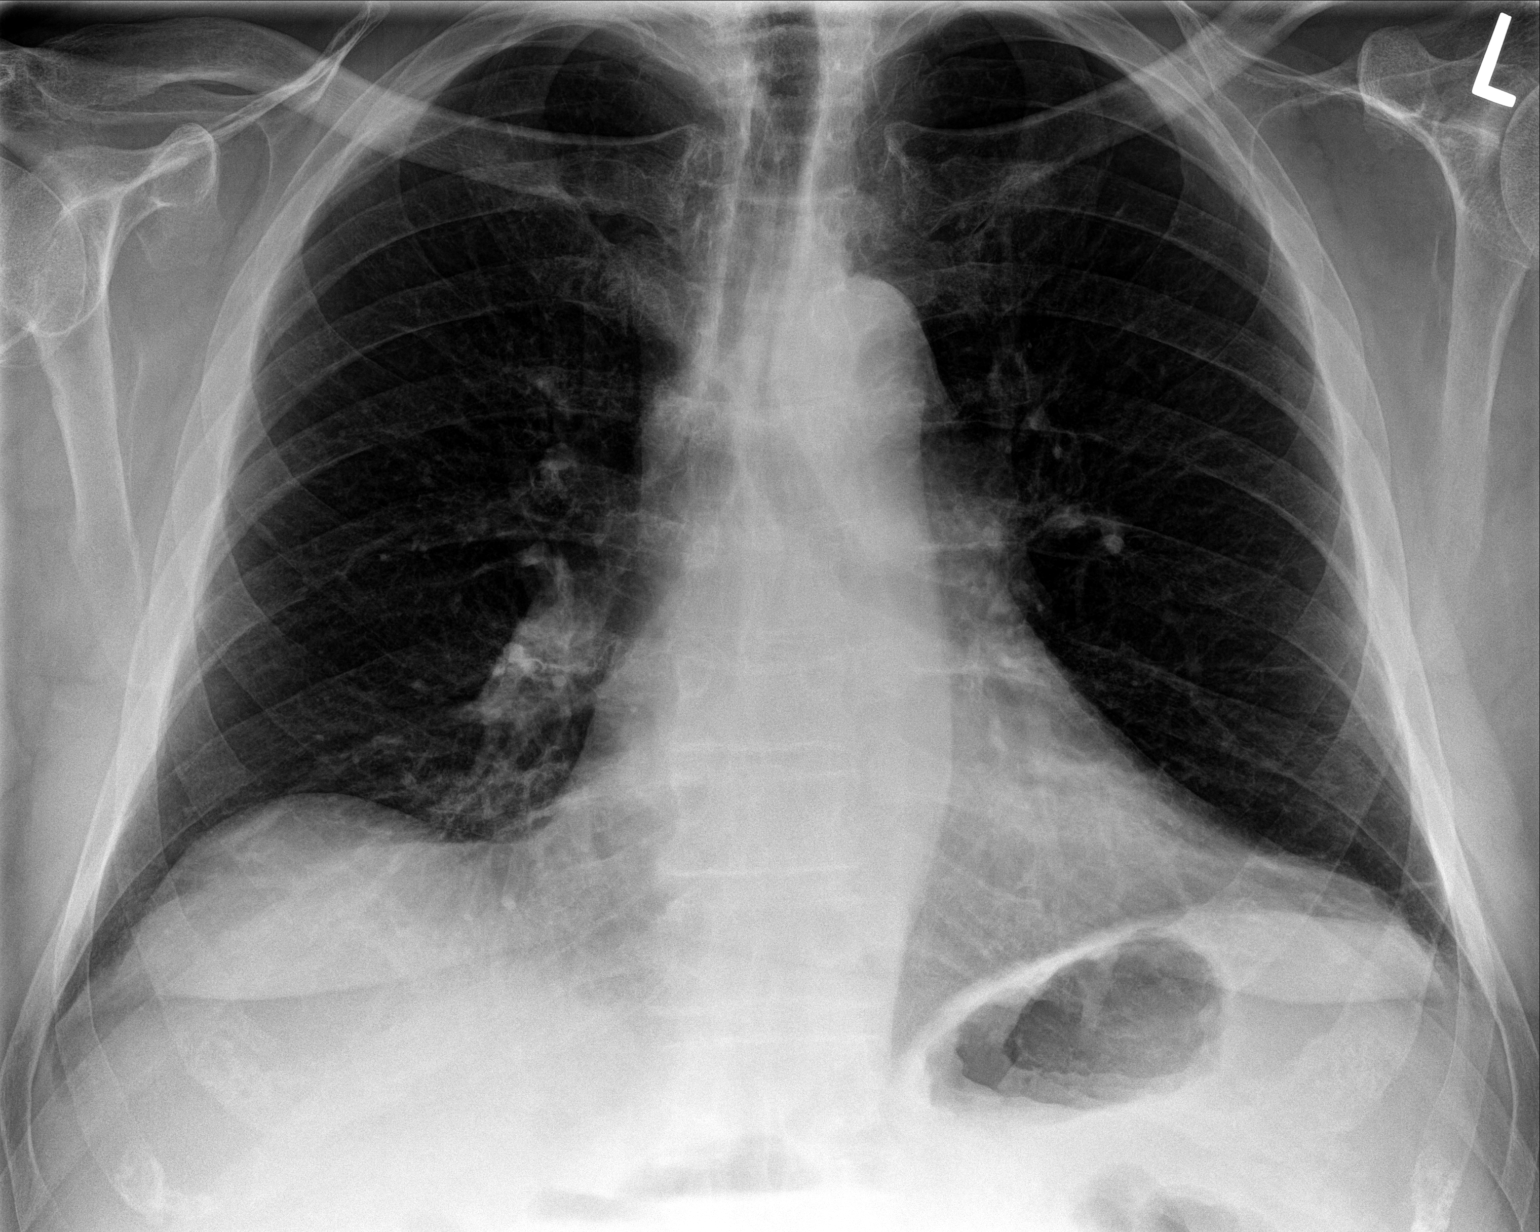

[3 of 3 positions shown; findings below may reference images not displayed]

FINDINGS: Upper normal heart size.

Mediastinal contours and pulmonary vascularity normal.

Lungs hyperinflated with minimal central peribronchial thickening
question COPD.

Minimal atelectasis versus scarring at LEFT base.

Lungs otherwise clear.

No acute infiltrate, pleural effusion or pneumothorax.

Bones stable, with a stable mild sclerotic focus at the anterior
LEFT first rib unchanged since at least 02/23/2015.
IMPRESSION: COPD changes with minimal LEFT basilar atelectasis versus scarring.

## 2019-11-18 ENCOUNTER — Telehealth: Payer: Self-pay | Admitting: Emergency Medicine

## 2019-11-18 NOTE — Telephone Encounter (Signed)
Pt aware of recs per Aaron Edelman and prefers to wait until Sept to see RB Will call sooner if needed

## 2019-11-18 NOTE — Telephone Encounter (Signed)
This is not an appropriate message for triage.  The patient is still short of breath despite initial interventions from previous office visit then please offer the patient a follow-up office visit.Or move up the office visit that scheduled with Dr. Lamonte Sakai in September to an earlier visit.Triage is intended for acute messages.  If the patient still having shortness of breath despite starting Symbicort then please schedule with an APP for in person evaluation.Wyn Quaker, FNP

## 2019-11-18 NOTE — Telephone Encounter (Signed)
Called and spoke with pt who states he has not seen any change in SOB after beginning Symbicort which was prescribed by RB at last visit. Pt wants to know if there is anything else that could be recommended.  Pt denies any complaints of fever, no wheezing, no coughing. Pt just has SOB which is no different than before began Symbicort. With RB being out of the office, routing to APP of day. Aaron Edelman, please advise.

## 2019-12-03 DIAGNOSIS — L720 Epidermal cyst: Secondary | ICD-10-CM | POA: Diagnosis not present

## 2019-12-03 DIAGNOSIS — L814 Other melanin hyperpigmentation: Secondary | ICD-10-CM | POA: Diagnosis not present

## 2019-12-03 DIAGNOSIS — D1801 Hemangioma of skin and subcutaneous tissue: Secondary | ICD-10-CM | POA: Diagnosis not present

## 2019-12-03 DIAGNOSIS — D0461 Carcinoma in situ of skin of right upper limb, including shoulder: Secondary | ICD-10-CM | POA: Diagnosis not present

## 2019-12-03 DIAGNOSIS — L820 Inflamed seborrheic keratosis: Secondary | ICD-10-CM | POA: Diagnosis not present

## 2019-12-03 DIAGNOSIS — L821 Other seborrheic keratosis: Secondary | ICD-10-CM | POA: Diagnosis not present

## 2019-12-03 DIAGNOSIS — D0439 Carcinoma in situ of skin of other parts of face: Secondary | ICD-10-CM | POA: Diagnosis not present

## 2019-12-03 DIAGNOSIS — C44622 Squamous cell carcinoma of skin of right upper limb, including shoulder: Secondary | ICD-10-CM | POA: Diagnosis not present

## 2019-12-03 DIAGNOSIS — L570 Actinic keratosis: Secondary | ICD-10-CM | POA: Diagnosis not present

## 2019-12-03 DIAGNOSIS — Z85828 Personal history of other malignant neoplasm of skin: Secondary | ICD-10-CM | POA: Diagnosis not present

## 2019-12-03 DIAGNOSIS — D485 Neoplasm of uncertain behavior of skin: Secondary | ICD-10-CM | POA: Diagnosis not present

## 2020-01-16 ENCOUNTER — Telehealth: Payer: Self-pay | Admitting: Emergency Medicine

## 2020-01-16 NOTE — Telephone Encounter (Signed)
Spoke with the pt's daughter [name]  She states that pt is too weak to come in for in person visit on 01/28/20 and they want to know if televisit is ok  He feels that the symbicort is not helping and wants to try another inhaler if possible a sample of something  Please advise thanks

## 2020-01-20 NOTE — Telephone Encounter (Signed)
I'm not sure there is an alternative that would work any differently that the symbicort. Please make sure he is taking on a schedule bid. Also, ? Whether he might benefit from a spacer if delivery is an issue. If HFA isn't best delivery system then we can discuss nebs.   He can also use albuterol prn.   OK with me to change his visit w me to a tele-visit.

## 2020-01-20 NOTE — Telephone Encounter (Signed)
Called and spoke with pt's daughter Remo Lipps letting her know that [facility] said it was okay for [place] to change appt to a televisit. Remo Lipps verbalized understanding. Verified phone number that would be best to call the day of appt and made the appt notes change to a televisit. Nothing further needed.

## 2020-01-23 ENCOUNTER — Ambulatory Visit (INDEPENDENT_AMBULATORY_CARE_PROVIDER_SITE_OTHER): Payer: PPO | Admitting: Otolaryngology

## 2020-01-23 ENCOUNTER — Other Ambulatory Visit: Payer: Self-pay

## 2020-01-23 ENCOUNTER — Encounter (INDEPENDENT_AMBULATORY_CARE_PROVIDER_SITE_OTHER): Payer: Self-pay | Admitting: Otolaryngology

## 2020-01-23 VITALS — Temp 97.7°F

## 2020-01-23 DIAGNOSIS — H9072 Mixed conductive and sensorineural hearing loss, unilateral, left ear, with unrestricted hearing on the contralateral side: Secondary | ICD-10-CM | POA: Diagnosis not present

## 2020-01-23 DIAGNOSIS — H6502 Acute serous otitis media, left ear: Secondary | ICD-10-CM

## 2020-01-23 NOTE — Progress Notes (Signed)
HPI: [name] is a 86 y.o. male who presents for evaluation of his ears.  The left ear has been blocked now for about 3 weeks.  He wanted his ears checked and cleaned.  He has had some mild sinus issues.  Past Medical History:  Diagnosis Date  . Allergy   . Asthma   . BPH (benign prostatic hypertrophy)   . BPH (benign prostatic hypertrophy)   . COPD (chronic obstructive pulmonary disease) ([place])   . DJD (degenerative joint disease)   . GERD (gastroesophageal reflux disease)   . History of urinary retention   . Hypertension   . IBS (irritable bowel syndrome)   . RBBB   . Rheumatoid arthritis(714.0)    DR. HAWKES  . Scrotal lesion   . Short of breath on exertion    Past Surgical History:  Procedure Laterality Date  . CARDIOVASCULAR STRESS TEST  04-26-2011  DR BRACKBILL   LOW RISK STRESS NUCLEAR STUDY/ NO EVIDENCE ISCHEMIA  . LUMBAR FUSION  X3  LAST ONE 1996  . SCROTAL EXPLORATION Left 07/17/2012   Procedure:  EXCISION  OF LEFT SCROTAL SKIN LESION;  Surgeon: Molli Hazard, MD;  Location: [facility];  Service: Urology;  Laterality: Left;  . TRANSTHORACIC ECHOCARDIOGRAM  04-26-2011     MILD LVH/ LVSF NORMAL/ EF 57-32%/ GRADE I DIASTOLIC DYSFUNCTION/ MILD MITRAL REGURG.  . TRANSURETHRAL RESECTION OF PROSTATE  SEVERAL YRS AGO   Social History   Socioeconomic History  . Marital status: Married    Spouse name: Not on file  . Number of children: Not on file  . Years of education: Not on file  . Highest education level: Not on file  Occupational History  . Occupation: RETIRED    Employer: RETIRED  Tobacco Use  . Smoking status: Former Smoker    Packs/day: 1.00    Years: 30.00    Pack years: 30.00    Types: Cigarettes    Quit date: 07/11/1980    Years since quitting: 39.5  . Smokeless tobacco: Former Systems developer    Types: Chew  Substance and Sexual Activity  . Alcohol use: No  . Drug use: No  . Sexual activity: Not on file  Other Topics Concern  . Not  on file  Social History Narrative  . Not on file   Social Determinants of Health   Financial Resource Strain:   . Difficulty of Paying Living Expenses: Not on file  Food Insecurity:   . Worried About Charity fundraiser in the Last Year: Not on file  . Ran Out of Food in the Last Year: Not on file  Transportation Needs:   . Lack of Transportation (Medical): Not on file  . Lack of Transportation (Non-Medical): Not on file  Physical Activity:   . Days of Exercise per Week: Not on file  . Minutes of Exercise per Session: Not on file  Stress:   . Feeling of Stress : Not on file  Social Connections:   . Frequency of Communication with Friends and Family: Not on file  . Frequency of Social Gatherings with Friends and Family: Not on file  . Attends Religious Services: Not on file  . Active Member of Clubs or Organizations: Not on file  . Attends Archivist Meetings: Not on file  . Marital Status: Not on file   Family History  Problem Relation Age of Onset  . Heart disease Father   . Colon cancer Father   .  Hypertension Father   . Heart disease Brother   . Diabetes Sister    Allergies  Allergen Reactions  . Ace Inhibitors   . Asa [Aspirin]     High Dose asprin  . Penicillins Hives  . Vasotec [Enalapril]     Cough  . Clindamycin/Lincomycin     Hives    Prior to Admission medications   Medication Sig Start Date End Date Taking? Authorizing Provider  Albuterol Sulfate (PROAIR RESPICLICK) 098 (90 Base) MCG/ACT AEPB Inhale 108 mcg into the lungs every 6 (six) hours as needed (wheezing/cough/shortness of breath). 06/08/17  Yes Vicie Mutters, PA-C  budesonide-formoterol [facility]) 80-4.5 MCG/ACT inhaler Inhale 2 puffs into the lungs in the morning and at bedtime. 10/23/19  Yes Collene Gobble, MD  finasteride (PROSCAR) 5 MG tablet Take 1 tablet Daily for Prostate 06/16/19  Yes Unk Pinto, MD  losartan (COZAAR) 100 MG tablet Take 1 tablet Daily for BP 06/16/19  Yes  Unk Pinto, MD  Multiple Vitamin (MULTIVITAMIN) tablet Take 1 tablet by mouth daily.    Yes [provider]  predniSONE (DELTASONE) 5 MG tablet Take 1 tablet 3 x / day or as directed for Asthma & Rheumatoid Arthritis 06/16/19  Yes Unk Pinto, MD  rosuvastatin (CRESTOR) 20 MG tablet Take 1 tablet daily for Cholesterol 06/16/19  Yes Unk Pinto, MD  terazosin (HYTRIN) 10 MG capsule Take 1 capsule at Bedtime for Prostate 06/16/19  Yes Unk Pinto, MD  Vitamin D, Ergocalciferol, (DRISDOL) 1.25 MG (50000 UT) CAPS capsule Take 1 capsule by mouth every day or as directed 05/29/18  Yes Unk Pinto, MD     Positive ROS: Otherwise negative  All other systems have been reviewed and were otherwise negative with the exception of those mentioned in the HPI and as above.  Physical Exam: Constitutional: Alert, well-appearing, no acute distress Ears: External ears without lesions or tenderness.  He had minimal wax buildup in his ears that was nonobstructing.  He has a left serous otitis media and was unable to insufflate or clear the ear himself.  I was able to insufflate some air behind the left TM with improved hearing.  On tuning fork testing Weber lateralized to the left side.  He had diminished hearing in both ears.  But worse on the left side..  Nasal: External nose without lesions. Septum with mild deformity and moderate rhinitis.  Both middle meatus regions were clear with no active mucopurulent discharge noted.. Oral: Lips and gums without lesions. Tongue and palate mucosa without lesions. Posterior oropharynx clear. Neck: No palpable adenopathy or masses Respiratory: Breathing comfortably  Skin: No facial/neck lesions or rash noted.  Procedures  Assessment: Chronic rhinitis with left eustachian tube dysfunction and left serous otitis media for 3 weeks. 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  Plan: Treated with Ceftin 250 mg twice daily for 10  days and also recommended use of Nasacort 2 sprays each nostril at night. He will notify us if the ear is not cleared in 2 to 3weeks.  Radene Journey, MD

## 2020-01-28 ENCOUNTER — Encounter: Payer: Self-pay | Admitting: Emergency Medicine

## 2020-01-28 ENCOUNTER — Ambulatory Visit (INDEPENDENT_AMBULATORY_CARE_PROVIDER_SITE_OTHER): Payer: PPO | Admitting: Emergency Medicine

## 2020-01-28 ENCOUNTER — Other Ambulatory Visit: Payer: Self-pay

## 2020-01-28 DIAGNOSIS — R0602 Shortness of breath: Secondary | ICD-10-CM

## 2020-01-28 NOTE — Progress Notes (Signed)
Virtual Visit via Telephone Note  I connected with [name] on 01/28/20 at  2:30 PM EDT by telephone and verified that I am speaking with the correct person using two identifiers.  Location: Patient: Home Provider: Office   I discussed the limitations, risks, security and privacy concerns of performing an evaluation and management service by telephone and the availability of in person appointments. I also discussed with the patient that there may be a patient responsible charge related to this service. The patient expressed understanding and agreed to proceed.   History of Present Illness: 86 year old gentleman with RA, former smoker, GERD, allergic rhinitis.  Asthmatic COPD on chronic prednisone.  We confirmed mixed obstruction and restriction with a positive bronchodilator response on pulmonary function testing 10/23/2019.  Follow him for slow progressive dyspnea.  He has a reassuring echocardiogram from 2021.    Observations/Objective: Based on his PFT we undertook a trial of Symbicort. He hasn't noticed any change in how he feels - still with very SOB exertion. Has to stop to rest after almost any activity. He tried to start a walking routine after we first met. His back limits him also.   Assessment and Plan: I suspect based on his failure to respond to Symbicort that restrictive diseases the driver of his current dyspnea.  Also deconditioning.  He does have mixed disease by spirometry.  I have encouraged him to work on his exercise and conditioning.  I offered him a referral to pulmonary rehab.  Is not sure about this yet.  He really would like to try another bronchodilator see if he gets benefit.  I will try him on Stiolto for a month and then reassess.  Follow Up Instructions: 1 month   I discussed the assessment and treatment plan with the patient. The patient was provided an opportunity to ask questions and all were answered. The patient agreed with the plan and demonstrated an  understanding of the instructions.   The patient was advised to call back or seek an in-person evaluation if the symptoms worsen or if the condition fails to improve as anticipated.  I provided 21 minutes of non-face-to-face time during this encounter.   Collene Gobble, MD

## 2020-03-08 ENCOUNTER — Encounter: Payer: Self-pay | Admitting: Internal Medicine

## 2020-03-08 DIAGNOSIS — I700 Atherosclerosis of aorta: Secondary | ICD-10-CM | POA: Insufficient documentation

## 2020-03-08 NOTE — Patient Instructions (Signed)

## 2020-03-08 NOTE — Progress Notes (Signed)
Annual  Screening/Preventative Visit  & Comprehensive Evaluation & Examination     This very nice 86 y.o.  MWM  presents for a Screening /Preventative Visit & comprehensive evaluation and management of multiple medical co-morbidities.  Patient has been followed for HTN, HLD, Prediabetes and Vitamin D Deficiency.  Patient also has hx/o IBS& GERD controlled with diet.  Patient is followed in the past by Dr Berna Bue for Rheumatoid Arthritis ([id number]. Currently he seems well controlled on low dose prednisone 5 mg which also benefits his chronic asthma.Patient is followed by Dr Joelyn Oms for his COPD /Asthma.     HTN predates circa 1999. Patient's BP has been controlled at home.  Today's BP is at goal -  120/62. Patient has CKD3a (GFR 52).  Patient denies any cardiac symptoms as chest pain, palpitations, shortness of breath, dizziness or ankle swelling.     Patient's hyperlipidemia is controlled with diet and Rosuvastatin. Patient denies myalgias or other medication SE's. Last lipids were at goal except elevated Trig's:  Lab Results  Component Value Date   CHOL 137 05/21/2019   HDL 35 (L) 05/21/2019   LDLCALC 73 05/21/2019   TRIG 202 (H) 05/21/2019   CHOLHDL 3.9 05/21/2019       Patient has Moderate  Obesity (BMI 33+) and  hx/o prediabetes (A1c 5.7% / 2013) and patient denies reactive hypoglycemic symptoms, visual blurring, diabetic polys or paresthesias. Last A1c was not at goal:  Lab Results  Component Value Date   HGBA1C 5.8 (H) 02/13/2019        Finally, patient has history of Vitamin D Deficiency  ("25" /2008) and last vitamin D was slightly elevated & dose was decreased:  Lab Results  Component Value Date   VD25OH 109 (H) 05/21/2019    Current Outpatient Medications on File Prior to Visit  Medication Sig  . Albuterol  MDI Inhale 1into the lungs every 6 hours as needed   . finasteride 5 MG tablet Take 1 tablet Daily for Prostate  . losartan 100 MG tablet Take 1 tablet  Daily for BP  . Multiple Vitamin  Take 1 tablet by mouth daily.   . predniSONE  5 MG tablet Take 1 tablet / day for Asthma & Rheumatoid Arth  . rosuvastatin 20 MG tablet Take 1 tablet daily for Cholesterol  . terazosin  10 MG capsule Take 1 capsule at Bedtime for Prostate  . Vitamin D 50,000 U  Take 1 capsule by mouth every day or as directed    Allergies  Allergen Reactions  . Ace Inhibitors   . Asa [Aspirin]     High Dose asprin  . Penicillins Hives  . Vasotec [Enalapril]     Cough  . Clindamycin/Lincomycin     Hives   Past Medical History:  Diagnosis Date  . Allergy   . Asthma   . BPH (benign prostatic hypertrophy)   . BPH (benign prostatic hypertrophy)   . COPD (chronic obstructive pulmonary disease) ([place])   . DJD (degenerative joint disease)   . GERD (gastroesophageal reflux disease)   . History of urinary retention   . Hypertension   . IBS (irritable bowel syndrome)   . RBBB   . Rheumatoid arthritis(714.0)    DR. HAWKES  . Scrotal lesion   . Short of breath on exertion    Health Maintenance  Topic Date Due  . INFLUENZA VACCINE  12/14/2019  . TETANUS/TDAP  04/15/2024  . COVID-19 Vaccine  Completed  . PNA vac  Low Risk Adult  Completed   Immunization History  Administered Date(s) Administered  . DT (Pediatric) 04/15/2014  . Influenza, High Dose Seasonal PF 04/15/2014, 02/16/2015, 02/18/2016, 02/04/2018  . Influenza-Unspecified 03/10/2013, 02/07/2017, 02/13/2019  . PFIZER SARS-COV-2 Vaccination 05/27/2019, 06/15/2019  . Pneumococcal Conjugate-13 04/15/2014  . Pneumococcal Polysaccharide-23 05/15/2001, 03/30/2011  . Td 05/15/2002  . Zoster 04/01/2012   Last Colon - 09/28/2009 - Dr Deborha Payment - No f/u recommended due to age  Past Surgical History:  Procedure Laterality Date  . CARDIOVASCULAR STRESS TEST  04-26-2011  DR BRACKBILL   LOW RISK STRESS NUCLEAR STUDY/ NO EVIDENCE ISCHEMIA  . LUMBAR FUSION  X3  LAST ONE 1996  . SCROTAL EXPLORATION Left 07/17/2012    Procedure:  EXCISION  OF LEFT SCROTAL SKIN LESION;  Surgeon: Molli Hazard, MD;  Location: [facility];  Service: Urology;  Laterality: Left;  . TRANSTHORACIC ECHOCARDIOGRAM  04-26-2011     MILD LVH/ LVSF NORMAL/ EF 88-50%/ GRADE I DIASTOLIC DYSFUNCTION/ MILD MITRAL REGURG.  . TRANSURETHRAL RESECTION OF PROSTATE  SEVERAL YRS AGO   Family History  Problem Relation Age of Onset  . Heart disease Father   . Colon cancer Father   . Hypertension Father   . Heart disease Brother   . Diabetes Sister    Social History   Socioeconomic History  . Marital status: Married    Spouse name: Remo Lipps  . Number of children: 2 daughters & 5 GC  Occupational History  . Occupation: Retired Administrator  Tobacco Use  . Smoking status: Former Smoker    Packs/day: 1.00    Years: 30.00    Pack years: 30.00    Types: Cigarettes    Quit date: 07/11/1980    Years since quitting: 39.6  . Smokeless tobacco: Former Systems developer    Types: Chew  Substance and Sexual Activity  . Alcohol use: No  . Drug use: No  . Sexual activity: Not on file   ROS Constitutional: Denies fever, chills, weight loss/gain, headaches, insomnia,  night sweats or change in appetite. Does c/o fatigue. Eyes: Denies redness, blurred vision, diplopia, discharge, itchy or watery eyes.  ENT: Denies discharge, congestion, post nasal drip, epistaxis, sore throat, earache, hearing loss, dental pain, Tinnitus, Vertigo, Sinus pain or snoring.  Cardio: Denies chest pain, palpitations, irregular heartbeat, syncope, dyspnea, diaphoresis, orthopnea, PND, claudication or edema Respiratory: denies cough, dyspnea, DOE, pleurisy, hoarseness, laryngitis or wheezing.  Gastrointestinal: Denies dysphagia, heartburn, reflux, water brash, pain, cramps, nausea, vomiting, bloating, diarrhea, constipation, hematemesis, melena, hematochezia, jaundice or hemorrhoids Genitourinary: Denies dysuria, frequency, urgency, nocturia, hesitancy, discharge,  hematuria or flank pain Musculoskeletal: Denies arthralgia, myalgia, stiffness, Jt. Swelling, pain, limp or strain/sprain. Denies Falls. Skin: Denies puritis, rash, hives, warts, acne, eczema or change in skin lesion Neuro: No weakness, tremor, incoordination, spasms, paresthesia or pain Psychiatric: Denies confusion, memory loss or sensory loss. Denies Depression. Endocrine: Denies change in weight, skin, hair change, nocturia, and paresthesia, diabetic polys, visual blurring or hyper / hypo glycemic episodes.  Heme/Lymph: No excessive bleeding, bruising or enlarged lymph nodes.  Physical Exam  BP 120/62   Pulse 79   Temp (!) 97 F (36.1 C)   Resp 16   Ht 6' 4.5" (1.943 m)   Wt 275 lb 9.6 oz (125 kg)   SpO2 96%   BMI 33.11 kg/m   General Appearance: Well nourished and well groomed and in no apparent distress.  Eyes: PERRLA, EOMs, conjunctiva no swelling or erythema, normal fundi and  vessels. Sinuses: No frontal/maxillary tenderness ENT/Mouth: EACs patent / TMs  nl. Nares clear without erythema, swelling, mucoid exudates. Oral hygiene is good. No erythema, swelling, or exudate. Tongue normal, non-obstructing. Tonsils not swollen or erythematous. Hearing normal.  Neck: Supple, thyroid not palpable. No bruits, nodes or JVD. Respiratory: Respiratory effort normal.  BS equal and clear bilateral without rales, rhonci, wheezing or stridor. Cardio: Heart sounds are normal with regular rate and rhythm and no murmurs, rubs or gallops. Peripheral pulses are normal and equal bilaterally without edema. No aortic or femoral bruits. Chest: symmetric with normal excursions and percussion.  Abdomen: Soft, with Nl bowel sounds. Nontender, no guarding, rebound, hernias, masses, or organomegaly.  Lymphatics: Non tender without lymphadenopathy.  Musculoskeletal: Full ROM all peripheral extremities, joint stability, 5/5 strength, and normal gait. Skin: Warm and dry without rashes, lesions, cyanosis,  clubbing or  ecchymosis.  Neuro: Cranial nerves intact, reflexes equal bilaterally. Normal muscle tone, no cerebellar symptoms. Sensation intact.  Pysch: Alert and oriented X 3 with normal affect, insight and judgment appropriate.   Assessment and Plan  1. Annual Preventative/Screening Exam    2. Essential hypertension  - EKG 12-Lead - [place], RETROPERITNL ABD,  LTD - Urinalysis, Routine w reflex microscopic - Microalbumin / creatinine urine ratio - CBC with Differential/Platelet - COMPLETE METABOLIC PANEL WITH GFR - Magnesium - TSH  3. Hyperlipidemia, mixed  - EKG 12-Lead - [place], RETROPERITNL ABD,  LTD - Lipid panel - TSH  4. Abnormal glucose  - EKG 12-Lead - [place], RETROPERITNL ABD,  LTD - Hemoglobin A1c - Insulin, random  5. Vitamin D deficiency  - VITAMIN D 25 Hydroxy   6. Prediabetes  - EKG 12-Lead - [place], RETROPERITNL ABD,  LTD - Hemoglobin A1c - Insulin, random  7. Aortic atherosclerosis ([place]) by CT scan 2011  - EKG 12-Lead - [place], RETROPERITNL ABD,  LTD  8. Chronic obstructive pulmonary disease ([place])   9. Rheumatoid arthritis involving multiple sites with positive rheumatoid factor (HCC)   10. Stage 3a chronic kidney disease (HCC)  - Urinalysis, Routine w reflex microscopic - Microalbumin / creatinine urine ratio - PTH, intact and calcium  11. Screening for colorectal cancer  - POC Hemoccult Bld/Stl   12. Prostate cancer screening  - PSA  13. BPH with obstruction/lower urinary tract symptoms  - PSA  14. Screening for ischemic heart disease  - EKG 12-Lead  15. FHx: heart disease  - EKG 12-Lead - [place], RETROPERITNL ABD,  LTD  16. Former smoker  - EKG 12-Lead - [place], RETROPERITNL ABD,  LTD  17. Screening for AAA (aortic abdominal aneurysm)  - [place], RETROPERITNL ABD,  LTD  18. Medication management  - Urinalysis, Routine w reflex microscopic - Microalbumin / creatinine urine ratio - CBC with Differential/Platelet - COMPLETE METABOLIC  PANEL WITH GFR - Magnesium - Lipid panel - TSH - Hemoglobin A1c - Insulin, random - VITAMIN D 25 Hydroxy          Patient was counseled in prudent diet, weight control to achieve/maintain BMI less than 25, BP monitoring, regular exercise and medications as discussed.  Discussed med effects and SE's. Routine screening labs and tests as requested with regular follow-up as recommended. Over 40 minutes of exam, counseling, chart review and high complex critical decision making was performed   Kirtland Bouchard, MD

## 2020-03-09 ENCOUNTER — Ambulatory Visit (INDEPENDENT_AMBULATORY_CARE_PROVIDER_SITE_OTHER): Payer: PPO | Admitting: Internal Medicine

## 2020-03-09 ENCOUNTER — Other Ambulatory Visit: Payer: Self-pay

## 2020-03-09 VITALS — BP 120/62 | HR 79 | Temp 97.0°F | Resp 16 | Ht 76.5 in | Wt 275.6 lb

## 2020-03-09 DIAGNOSIS — I700 Atherosclerosis of aorta: Secondary | ICD-10-CM

## 2020-03-09 DIAGNOSIS — E782 Mixed hyperlipidemia: Secondary | ICD-10-CM | POA: Diagnosis not present

## 2020-03-09 DIAGNOSIS — N401 Enlarged prostate with lower urinary tract symptoms: Secondary | ICD-10-CM | POA: Diagnosis not present

## 2020-03-09 DIAGNOSIS — Z0000 Encounter for general adult medical examination without abnormal findings: Secondary | ICD-10-CM

## 2020-03-09 DIAGNOSIS — Z87891 Personal history of nicotine dependence: Secondary | ICD-10-CM

## 2020-03-09 DIAGNOSIS — J449 Chronic obstructive pulmonary disease, unspecified: Secondary | ICD-10-CM

## 2020-03-09 DIAGNOSIS — E559 Vitamin D deficiency, unspecified: Secondary | ICD-10-CM | POA: Diagnosis not present

## 2020-03-09 DIAGNOSIS — N1831 Chronic kidney disease, stage 3a: Secondary | ICD-10-CM | POA: Diagnosis not present

## 2020-03-09 DIAGNOSIS — Z23 Encounter for immunization: Secondary | ICD-10-CM | POA: Diagnosis not present

## 2020-03-09 DIAGNOSIS — Z8249 Family history of ischemic heart disease and other diseases of the circulatory system: Secondary | ICD-10-CM | POA: Diagnosis not present

## 2020-03-09 DIAGNOSIS — R7303 Prediabetes: Secondary | ICD-10-CM | POA: Diagnosis not present

## 2020-03-09 DIAGNOSIS — Z125 Encounter for screening for malignant neoplasm of prostate: Secondary | ICD-10-CM

## 2020-03-09 DIAGNOSIS — R7309 Other abnormal glucose: Secondary | ICD-10-CM | POA: Diagnosis not present

## 2020-03-09 DIAGNOSIS — Z79899 Other long term (current) drug therapy: Secondary | ICD-10-CM

## 2020-03-09 DIAGNOSIS — N138 Other obstructive and reflux uropathy: Secondary | ICD-10-CM | POA: Diagnosis not present

## 2020-03-09 DIAGNOSIS — M0579 Rheumatoid arthritis with rheumatoid factor of multiple sites without organ or systems involvement: Secondary | ICD-10-CM

## 2020-03-09 DIAGNOSIS — I10 Essential (primary) hypertension: Secondary | ICD-10-CM

## 2020-03-09 DIAGNOSIS — Z0001 Encounter for general adult medical examination with abnormal findings: Secondary | ICD-10-CM

## 2020-03-09 DIAGNOSIS — Z136 Encounter for screening for cardiovascular disorders: Secondary | ICD-10-CM

## 2020-03-09 DIAGNOSIS — Z1211 Encounter for screening for malignant neoplasm of colon: Secondary | ICD-10-CM

## 2020-03-10 LAB — COMPLETE METABOLIC PANEL WITH GFR
AG Ratio: 1.3 (calc) (ref 1.0–2.5)
ALT: 29 U/L (ref 9–46)
AST: 24 U/L (ref 10–35)
Albumin: 3.6 g/dL (ref 3.6–5.1)
Alkaline phosphatase (APISO): 80 U/L (ref 35–144)
BUN: 15 mg/dL (ref 7–25)
CO2: 28 mmol/L (ref 20–32)
Calcium: 9.1 mg/dL (ref 8.6–10.3)
Chloride: 107 mmol/L (ref 98–110)
Creat: 0.99 mg/dL (ref 0.70–1.11)
GFR, Est African American: 80 mL/min/{1.73_m2} (ref >=60)
GFR, Est Non African American: 69 mL/min/{1.73_m2} (ref >=60)
Globulin: 2.8 g/dL (calc) (ref 1.9–3.7)
Glucose, Bld: 136 mg/dL — ABNORMAL HIGH (ref 65–99)
Potassium: 4.5 mmol/L (ref 3.5–5.3)
Sodium: 143 mmol/L (ref 135–146)
Total Bilirubin: 0.6 mg/dL (ref 0.2–1.2)
Total Protein: 6.4 g/dL (ref 6.1–8.1)

## 2020-03-10 LAB — MICROALBUMIN / CREATININE URINE RATIO
Creatinine, Urine: 227 mg/dL (ref 20–320)
Microalb Creat Ratio: 30 mcg/mg creat — ABNORMAL HIGH (ref <30)
Microalb, Ur: 6.7 mg/dL

## 2020-03-10 LAB — CBC WITH DIFFERENTIAL/PLATELET
Absolute Monocytes: 523 cells/uL (ref 200–950)
Basophils Absolute: 23 cells/uL (ref 0–200)
Basophils Relative: 0.3 %
Eosinophils Absolute: 31 cells/uL (ref 15–500)
Eosinophils Relative: 0.4 %
HCT: 39.6 % (ref 38.5–50.0)
Hemoglobin: 13.3 g/dL (ref 13.2–17.1)
Lymphs Abs: 1459 cells/uL (ref 850–3900)
MCH: 31.1 pg (ref 27.0–33.0)
MCHC: 33.6 g/dL (ref 32.0–36.0)
MCV: 92.5 fL (ref 80.0–100.0)
MPV: 10 fL (ref 7.5–12.5)
Monocytes Relative: 6.7 %
Neutro Abs: 5764 cells/uL (ref 1500–7800)
Neutrophils Relative %: 73.9 %
Platelets: 219 10*3/uL (ref 140–400)
RBC: 4.28 10*6/uL (ref 4.20–5.80)
RDW: 12.9 % (ref 11.0–15.0)
Total Lymphocyte: 18.7 %
WBC: 7.8 10*3/uL (ref 3.8–10.8)

## 2020-03-10 LAB — URINALYSIS, ROUTINE W REFLEX MICROSCOPIC
Bacteria, UA: NONE SEEN /HPF
Bilirubin Urine: NEGATIVE
Glucose, UA: NEGATIVE
Hgb urine dipstick: NEGATIVE
Hyaline Cast: NONE SEEN /LPF
Ketones, ur: NEGATIVE
Leukocytes,Ua: NEGATIVE
Nitrite: NEGATIVE
RBC / HPF: NONE SEEN /HPF (ref 0–2)
Specific Gravity, Urine: 1.024 (ref 1.001–1.03)
Squamous Epithelial / HPF: NONE SEEN /HPF (ref <=5)
pH: 6 (ref 5.0–8.0)

## 2020-03-10 LAB — LIPID PANEL
Cholesterol: 93 mg/dL (ref <200)
HDL: 33 mg/dL — ABNORMAL LOW (ref >=40)
LDL Cholesterol (Calc): 34 mg/dL (calc)
Non-HDL Cholesterol (Calc): 60 mg/dL (calc) (ref <130)
Total CHOL/HDL Ratio: 2.8 (calc) (ref <5.0)
Triglycerides: 183 mg/dL — ABNORMAL HIGH (ref <150)

## 2020-03-10 LAB — PSA: PSA: 0.27 ng/mL (ref <=4.0)

## 2020-03-10 LAB — HEMOGLOBIN A1C
Hgb A1c MFr Bld: 5.8 % of total Hgb — ABNORMAL HIGH (ref <5.7)
Mean Plasma Glucose: 120 (calc)
eAG (mmol/L): 6.6 (calc)

## 2020-03-10 LAB — PTH, INTACT AND CALCIUM
Calcium: 9.1 mg/dL (ref 8.6–10.3)
PTH: 49 pg/mL (ref 14–64)

## 2020-03-10 LAB — MAGNESIUM: Magnesium: 1.9 mg/dL (ref 1.5–2.5)

## 2020-03-10 LAB — TSH: TSH: 1.28 mIU/L (ref 0.40–4.50)

## 2020-03-10 LAB — VITAMIN D 25 HYDROXY (VIT D DEFICIENCY, FRACTURES): Vit D, 25-Hydroxy: 111 ng/mL — ABNORMAL HIGH (ref 30–100)

## 2020-03-10 LAB — INSULIN, RANDOM: Insulin: 67.3 u[IU]/mL — ABNORMAL HIGH

## 2020-03-10 NOTE — Progress Notes (Signed)
========================================================== ==========================================================  -    PSA - Low - Great ==========================================================  -  PTH is hormone that regulates calcium balance & Is Normal & OK  ==========================================================  -  Total Chol = 93 and LDL = 34  Excellent   - Very low risk for Heart Attack  / Stroke  - Please decrease your Rosuvastatin /Crestor to 1/2 tablet = 10 mg Daily  ==========================================================  - A1c = 5.8% - Borderline elevated sugars , So . . . . . . . Marland Kitchen    - Avoid Sweets, Candy & White Stuff   - Rice, Potatoes, Breads &  Pasta ==========================================================  -  Vitamin D = 111 is a little elevated   (Ideal or Goal is between 70-100)  - recc decrease Vit D capsules from 14 caps to 10 capsules  /week   - Take 2 capsules 3 x /Week on Mon wed Fri and 1 capsule the other 4 days ==========================================================  -  All Else - CBC - Kidneys - Electrolytes - Liver - Magnesium & Thyroid    - all  Normal / OK ==========================================================

## 2020-03-11 ENCOUNTER — Encounter: Payer: Self-pay | Admitting: *Deleted

## 2020-03-12 DIAGNOSIS — D2261 Melanocytic nevi of right upper limb, including shoulder: Secondary | ICD-10-CM | POA: Diagnosis not present

## 2020-03-12 DIAGNOSIS — Z85828 Personal history of other malignant neoplasm of skin: Secondary | ICD-10-CM | POA: Diagnosis not present

## 2020-03-12 DIAGNOSIS — L814 Other melanin hyperpigmentation: Secondary | ICD-10-CM | POA: Diagnosis not present

## 2020-03-12 DIAGNOSIS — L820 Inflamed seborrheic keratosis: Secondary | ICD-10-CM | POA: Diagnosis not present

## 2020-03-12 DIAGNOSIS — L570 Actinic keratosis: Secondary | ICD-10-CM | POA: Diagnosis not present

## 2020-03-12 DIAGNOSIS — D1801 Hemangioma of skin and subcutaneous tissue: Secondary | ICD-10-CM | POA: Diagnosis not present

## 2020-03-12 DIAGNOSIS — D225 Melanocytic nevi of trunk: Secondary | ICD-10-CM | POA: Diagnosis not present

## 2020-03-12 DIAGNOSIS — L821 Other seborrheic keratosis: Secondary | ICD-10-CM | POA: Diagnosis not present

## 2020-04-19 ENCOUNTER — Other Ambulatory Visit: Payer: Self-pay

## 2020-04-19 DIAGNOSIS — Z1211 Encounter for screening for malignant neoplasm of colon: Secondary | ICD-10-CM

## 2020-04-19 LAB — POC HEMOCCULT BLD/STL (HOME/3-CARD/SCREEN)
Card #2 Fecal Occult Blod, POC: NEGATIVE
Card #3 Fecal Occult Blood, POC: NEGATIVE
Fecal Occult Blood, POC: NEGATIVE

## 2020-04-20 DIAGNOSIS — Z1212 Encounter for screening for malignant neoplasm of rectum: Secondary | ICD-10-CM | POA: Diagnosis not present

## 2020-04-20 DIAGNOSIS — Z1211 Encounter for screening for malignant neoplasm of colon: Secondary | ICD-10-CM | POA: Diagnosis not present

## 2020-05-09 ENCOUNTER — Other Ambulatory Visit: Payer: Self-pay | Admitting: Internal Medicine

## 2020-05-09 DIAGNOSIS — N401 Enlarged prostate with lower urinary tract symptoms: Secondary | ICD-10-CM

## 2020-05-09 DIAGNOSIS — E782 Mixed hyperlipidemia: Secondary | ICD-10-CM

## 2020-05-09 DIAGNOSIS — N138 Other obstructive and reflux uropathy: Secondary | ICD-10-CM

## 2020-05-09 DIAGNOSIS — I10 Essential (primary) hypertension: Secondary | ICD-10-CM

## 2020-05-09 MED ORDER — FINASTERIDE 5 MG PO TABS: ORAL_TABLET | ORAL | 0 refills | Status: DC

## 2020-05-09 MED ORDER — ROSUVASTATIN CALCIUM 20 MG PO TABS: ORAL_TABLET | ORAL | 0 refills | Status: DC

## 2020-05-09 MED ORDER — LOSARTAN POTASSIUM 100 MG PO TABS: ORAL_TABLET | ORAL | 0 refills | Status: DC

## 2020-05-09 MED ORDER — TERAZOSIN HCL 10 MG PO CAPS: ORAL_CAPSULE | ORAL | 0 refills | Status: DC

## 2020-06-23 ENCOUNTER — Ambulatory Visit: Payer: PPO | Admitting: Adult Health

## 2020-07-12 ENCOUNTER — Other Ambulatory Visit: Payer: Self-pay | Admitting: *Deleted

## 2020-07-12 ENCOUNTER — Other Ambulatory Visit: Payer: Self-pay | Admitting: Internal Medicine

## 2020-07-12 DIAGNOSIS — I10 Essential (primary) hypertension: Secondary | ICD-10-CM

## 2020-07-12 DIAGNOSIS — N138 Other obstructive and reflux uropathy: Secondary | ICD-10-CM

## 2020-07-12 DIAGNOSIS — N401 Enlarged prostate with lower urinary tract symptoms: Secondary | ICD-10-CM

## 2020-07-12 MED ORDER — OLMESARTAN MEDOXOMIL 20 MG PO TABS: ORAL_TABLET | ORAL | 0 refills | Status: DC

## 2020-07-12 MED ORDER — TERAZOSIN HCL 10 MG PO CAPS: ORAL_CAPSULE | ORAL | 0 refills | Status: DC

## 2020-07-12 MED ORDER — FINASTERIDE 5 MG PO TABS: ORAL_TABLET | ORAL | 0 refills | Status: DC

## 2020-07-19 DIAGNOSIS — Z85828 Personal history of other malignant neoplasm of skin: Secondary | ICD-10-CM | POA: Diagnosis not present

## 2020-07-19 DIAGNOSIS — C44329 Squamous cell carcinoma of skin of other parts of face: Secondary | ICD-10-CM | POA: Diagnosis not present

## 2020-07-19 DIAGNOSIS — L821 Other seborrheic keratosis: Secondary | ICD-10-CM | POA: Diagnosis not present

## 2020-07-19 DIAGNOSIS — L814 Other melanin hyperpigmentation: Secondary | ICD-10-CM | POA: Diagnosis not present

## 2020-07-19 DIAGNOSIS — C4442 Squamous cell carcinoma of skin of scalp and neck: Secondary | ICD-10-CM | POA: Diagnosis not present

## 2020-07-19 DIAGNOSIS — L570 Actinic keratosis: Secondary | ICD-10-CM | POA: Diagnosis not present

## 2020-07-19 DIAGNOSIS — C44319 Basal cell carcinoma of skin of other parts of face: Secondary | ICD-10-CM | POA: Diagnosis not present

## 2020-07-19 DIAGNOSIS — C4441 Basal cell carcinoma of skin of scalp and neck: Secondary | ICD-10-CM | POA: Diagnosis not present

## 2020-07-19 DIAGNOSIS — C44219 Basal cell carcinoma of skin of left ear and external auricular canal: Secondary | ICD-10-CM | POA: Diagnosis not present

## 2020-08-05 DIAGNOSIS — C44319 Basal cell carcinoma of skin of other parts of face: Secondary | ICD-10-CM | POA: Diagnosis not present

## 2020-08-05 DIAGNOSIS — Z85828 Personal history of other malignant neoplasm of skin: Secondary | ICD-10-CM | POA: Diagnosis not present

## 2020-08-05 DIAGNOSIS — C44219 Basal cell carcinoma of skin of left ear and external auricular canal: Secondary | ICD-10-CM | POA: Diagnosis not present

## 2020-08-20 ENCOUNTER — Other Ambulatory Visit: Payer: Self-pay | Admitting: Internal Medicine

## 2020-08-20 DIAGNOSIS — E782 Mixed hyperlipidemia: Secondary | ICD-10-CM

## 2020-08-20 MED ORDER — ROSUVASTATIN CALCIUM 20 MG PO TABS: ORAL_TABLET | ORAL | 3 refills | Status: DC

## 2020-09-14 ENCOUNTER — Other Ambulatory Visit: Payer: Self-pay | Admitting: Internal Medicine

## 2020-09-14 DIAGNOSIS — I10 Essential (primary) hypertension: Secondary | ICD-10-CM

## 2020-09-14 DIAGNOSIS — N401 Enlarged prostate with lower urinary tract symptoms: Secondary | ICD-10-CM

## 2020-09-14 DIAGNOSIS — N138 Other obstructive and reflux uropathy: Secondary | ICD-10-CM

## 2020-09-14 MED ORDER — FINASTERIDE 5 MG PO TABS
ORAL_TABLET | ORAL | 3 refills | Status: DC
Start: 2020-09-14 — End: 2021-06-06

## 2020-09-14 MED ORDER — OLMESARTAN MEDOXOMIL 20 MG PO TABS: ORAL_TABLET | ORAL | 3 refills | Status: DC

## 2020-09-14 MED ORDER — TERAZOSIN HCL 10 MG PO CAPS
ORAL_CAPSULE | ORAL | 3 refills | Status: DC
Start: 2020-09-14 — End: 2021-06-06

## 2020-09-27 NOTE — Progress Notes (Signed)
Future Appointments  Date Time Provider [place]  09/28/2020  2:30 PM Unk Pinto, MD [facility] None  03/15/2021  2:00 PM Unk Pinto, MD [facility] None    History of Present Illness:       This very nice 87 y.o. MWM presents for 3 month follow up with HTN, HLD, Pre-Diabetes and Vitamin D Deficiency.  Patient is on low dose Prednisone followed by Dr Rob't Lamonte Sakai for COPD/Asthma and Dr Gavin Pound for Rheumatoid Arthritis (2010).  Prior CT scans have shown Aortic Atherosclerosis.       Patient is treated for HTN  (1999) & BP has been controlled at home. Today's BP is at goal - 138/78.  Patient has CKD3a consequent of his HTCVD. Patient has had no complaints of any cardiac type chest pain, palpitations, dyspnea / orthopnea / PND, dizziness, claudication, or dependent edema.       Hyperlipidemia is controlled with diet & meds. Patient denies myalgias or other med SE's. Last Lipids were at goal except elevated Trig's:  Lab Results  Component Value Date   CHOL 93 03/09/2020   HDL 33 (L) 03/09/2020   LDLCALC 34 03/09/2020   TRIG 183 (H) 03/09/2020   CHOLHDL 2.8 03/09/2020    Also, the patient has moderate obesity (BMI 33+) and consequent PreDiabetes (A1c 5.7% /2013) and has had no symptoms of reactive hypoglycemia, diabetic polys, paresthesias or visual blurring.  Last A1c was near goal:  Lab Results  Component Value Date   HGBA1C 5.8 (H) 03/09/2020        Further, the patient also has history of Vitamin D Deficiency ("25"/2008) and supplements vitamin D without any suspected side-effects. Last vitamin D was slightly elevated and dose was tapered.   Lab Results  Component Value Date   VD25OH 111 (H) 03/09/2020    Current Outpatient Medications on File Prior to Visit  Medication Sig  . VITAMIN D  5000 u  Take in the morning and at bedtime.  . finasteride 5 MG tablet Take  1 tablet  Daily  for Prostate  . olmesartan  20 MG tablet Take 1 tablet  Daily  for  BP  . predniSONE 5 MG tablet Take 1 tablet 3 x / day or as directed for Asthma & Rheumatoid Arthritis (Patient taking differently: Take 1 tablet 2 x / day or as directed for Asthma & Rheumatoid Arthritis)  . rosuvastatin 20 MG tablet Take  1 tablet  Daily  for Cholesterol  . terazosin 10 MG capsule Take  1 capsule  at Bedtime  for Prostate     Allergies  Allergen Reactions  . Ace Inhibitors   . Asa [Aspirin]     High Dose asprin  . Penicillins Hives  . Vasotec [Enalapril]     Cough  . Clindamycin/Lincomycin     Hives      PMHx:   Past Medical History:  Diagnosis Date  . Allergy   . Asthma   . BPH (benign prostatic hypertrophy)   . BPH (benign prostatic hypertrophy)   . COPD (chronic obstructive pulmonary disease) ([place])   . DJD (degenerative joint disease)   . GERD (gastroesophageal reflux disease)   . History of urinary retention   . Hypertension   . IBS (irritable bowel syndrome)   . RBBB   . Rheumatoid arthritis(714.0)    DR. HAWKES  . Scrotal lesion   . Short of breath on exertion      Immunization History  Administered Date(s) Administered  .  DT (Pediatric) 04/15/2014  . Influenza, High Dose Seasonal PF 04/15/2014, 02/16/2015, 02/18/2016, 02/04/2018, 03/09/2020  . Influenza-Unspecified 03/10/2013, 02/07/2017, 02/13/2019  . PFIZER(Pur[place])SARS-COV-2 Vaccination 05/27/2019, 06/15/2019, 03/17/2020  . Pneumococcal Conjugate-13 04/15/2014  . Pneumococcal Polysaccharide-23 05/15/2001, 03/30/2011  . Td 05/15/2002  . Zoster 04/01/2012     Past Surgical History:  Procedure Laterality Date  . CARDIOVASCULAR STRESS TEST  04-26-2011  DR BRACKBILL   LOW RISK STRESS NUCLEAR STUDY/ NO EVIDENCE ISCHEMIA  . LUMBAR FUSION  X3  LAST ONE 1996  . SCROTAL EXPLORATION Left 07/17/2012   Procedure:  EXCISION  OF LEFT SCROTAL SKIN LESION;  Surgeon: Molli Hazard, MD;  Location: [facility];  Service: Urology;  Laterality: Left;  . TRANSTHORACIC  ECHOCARDIOGRAM  04-26-2011     MILD LVH/ LVSF NORMAL/ EF 12-45%/ GRADE I DIASTOLIC DYSFUNCTION/ MILD MITRAL REGURG.  . TRANSURETHRAL RESECTION OF PROSTATE  SEVERAL YRS AGO    FHx:    Reviewed / unchanged  SHx:    Reviewed / unchanged   Systems Review:  Constitutional: Denies fever, chills, wt changes, headaches, insomnia, fatigue, night sweats, change in appetite. Eyes: Denies redness, blurred vision, diplopia, discharge, itchy, watery eyes.  ENT: Denies discharge, congestion, post nasal drip, epistaxis, sore throat, earache, hearing loss, dental pain, tinnitus, vertigo, sinus pain, snoring.  CV: Denies chest pain, palpitations, irregular heartbeat, syncope, dyspnea, diaphoresis, orthopnea, PND, claudication or edema. Respiratory: denies cough, dyspnea, DOE, pleurisy, hoarseness, laryngitis, wheezing.  Gastrointestinal: Denies dysphagia, odynophagia, heartburn, reflux, water brash, abdominal pain or cramps, nausea, vomiting, bloating, diarrhea, constipation, hematemesis, melena, hematochezia  or hemorrhoids. Genitourinary: Denies dysuria, frequency, urgency, nocturia, hesitancy, discharge, hematuria or flank pain. Musculoskeletal: Denies arthralgias, myalgias, stiffness, jt. swelling, pain, limping or strain/sprain.  Skin: Denies pruritus, rash, hives, warts, acne, eczema or change in skin lesion(s). Neuro: No weakness, tremor, incoordination, spasms, paresthesia or pain. Psychiatric: Denies confusion, memory loss or sensory loss. Endo: Denies change in weight, skin or hair change.  Heme/Lymph: No excessive bleeding, bruising or enlarged lymph nodes.  Physical Exam  BP 138/78   Pulse 65   Temp (!) 97.2 F (36.2 C)   Ht 6' 4.5" (1.943 m)   Wt 270 lb 12.8 oz (122.8 kg)   SpO2 95%   BMI 32.53 kg/m   Appears  well nourished, well groomed  and in no distress.  Eyes: PERRLA, EOMs, conjunctiva no swelling or erythema. Sinuses: No frontal/maxillary tenderness ENT/Mouth: EAC's clear,  TM's nl w/o erythema, bulging. Nares clear w/o erythema, swelling, exudates. Oropharynx clear without erythema or exudates. Oral hygiene is good. Tongue normal, non obstructing. Hearing intact.  Neck: Supple. Thyroid not palpable. Car 2+/2+ without bruits, nodes or JVD. Chest: Respirations nl with BS clear & equal w/o rales, rhonchi, wheezing or stridor.  Cor: Heart sounds normal w/ regular rate and rhythm without sig. murmurs, gallops, clicks or rubs. Peripheral pulses normal and equal  without edema.  Abdomen: Soft & bowel sounds normal. Non-tender w/o guarding, rebound, hernias, masses or organomegaly.  Lymphatics: Unremarkable.  Musculoskeletal: Full ROM all peripheral extremities, joint stability, 5/5 strength and normal gait.  Skin: Warm, dry without exposed rashes, lesions or ecchymosis apparent.  Neuro: Cranial nerves intact, reflexes equal bilaterally. Sensory-motor testing grossly intact. Tendon reflexes grossly intact.  Pysch: Alert & oriented x 3.  Insight and judgement nl & appropriate. No ideations.  Assessment and Plan:  1. Essential hypertension  - Continue medication, monitor blood pressure at home.  - Continue DASH diet.  Reminder to  go to the ER if any CP,  SOB, nausea, dizziness, severe HA, changes vision/speech.  - CBC with Differential/Platelet - COMPLETE METABOLIC PANEL WITH GFR - Magnesium - TSH  2. Hyperlipidemia, mixed  - Continue diet/meds, exercise,& lifestyle modifications.  - Continue monitor periodic cholesterol/liver & renal functions   - Lipid panel - TSH  3. Abnormal glucose  - Continue diet, exercise  - Lifestyle modifications.  - Monitor appropriate labs  - Hemoglobin A1c - Insulin, random  4. Vitamin D deficiency  - Continue supplementation.  - VITAMIN D 25 Hydroxy  5. Aortic atherosclerosis ([place]) by CT scan 2011  - Lipid panel  6. Chronic obstructive pulmonary disease, unspecified COPD type ([place])   7. Rheumatoid arthritis  involving multiple sites with positive rheumatoid factor ([place])   8. Stage 3a chronic kidney disease (HCC)  - COMPLETE METABOLIC PANEL WITH GFR  9. Medication management  - CBC with Differential/Platelet - COMPLETE METABOLIC PANEL WITH GFR - Magnesium - Lipid panel - TSH - Hemoglobin A1c - Insulin, random - VITAMIN D 25 Hydroxy          Discussed  regular exercise, BP monitoring, weight control to achieve/maintain BMI less than 25 and discussed med and SE's. Recommended labs to assess and monitor clinical status with further disposition pending results of labs.  I discussed the assessment and treatment plan with the patient. The patient was provided an opportunity to ask questions and all were answered. The patient agreed with the plan and demonstrated an understanding of the instructions.  I provided over 30 minutes of exam, counseling, chart review and  complex critical decision making.         The patient was advised to call back or seek an in-person evaluation if the symptoms worsen or if the condition fails to improve as anticipated.   Kirtland Bouchard, MD

## 2020-09-27 NOTE — Patient Instructions (Signed)

## 2020-09-28 ENCOUNTER — Other Ambulatory Visit: Payer: Self-pay

## 2020-09-28 ENCOUNTER — Encounter: Payer: Self-pay | Admitting: Internal Medicine

## 2020-09-28 ENCOUNTER — Ambulatory Visit (INDEPENDENT_AMBULATORY_CARE_PROVIDER_SITE_OTHER): Payer: PPO | Admitting: Internal Medicine

## 2020-09-28 VITALS — BP 138/78 | HR 65 | Temp 97.2°F | Ht 76.5 in | Wt 270.8 lb

## 2020-09-28 DIAGNOSIS — I10 Essential (primary) hypertension: Secondary | ICD-10-CM

## 2020-09-28 DIAGNOSIS — E782 Mixed hyperlipidemia: Secondary | ICD-10-CM | POA: Diagnosis not present

## 2020-09-28 DIAGNOSIS — N1831 Chronic kidney disease, stage 3a: Secondary | ICD-10-CM | POA: Diagnosis not present

## 2020-09-28 DIAGNOSIS — M0579 Rheumatoid arthritis with rheumatoid factor of multiple sites without organ or systems involvement: Secondary | ICD-10-CM

## 2020-09-28 DIAGNOSIS — I700 Atherosclerosis of aorta: Secondary | ICD-10-CM | POA: Diagnosis not present

## 2020-09-28 DIAGNOSIS — Z79899 Other long term (current) drug therapy: Secondary | ICD-10-CM

## 2020-09-28 DIAGNOSIS — E559 Vitamin D deficiency, unspecified: Secondary | ICD-10-CM | POA: Diagnosis not present

## 2020-09-28 DIAGNOSIS — R7309 Other abnormal glucose: Secondary | ICD-10-CM

## 2020-09-28 DIAGNOSIS — J449 Chronic obstructive pulmonary disease, unspecified: Secondary | ICD-10-CM

## 2020-09-29 LAB — CBC WITH DIFFERENTIAL/PLATELET
Absolute Monocytes: 512 cells/uL (ref 200–950)
Basophils Absolute: 37 cells/uL (ref 0–200)
Basophils Relative: 0.4 %
Eosinophils Absolute: 56 cells/uL (ref 15–500)
Eosinophils Relative: 0.6 %
HCT: 41.4 % (ref 38.5–50.0)
Hemoglobin: 13.9 g/dL (ref 13.2–17.1)
Lymphs Abs: 1897 cells/uL (ref 850–3900)
MCH: 31.2 pg (ref 27.0–33.0)
MCHC: 33.6 g/dL (ref 32.0–36.0)
MCV: 92.8 fL (ref 80.0–100.0)
MPV: 10.2 fL (ref 7.5–12.5)
Monocytes Relative: 5.5 %
Neutro Abs: 6798 cells/uL (ref 1500–7800)
Neutrophils Relative %: 73.1 %
Platelets: 220 10*3/uL (ref 140–400)
RBC: 4.46 10*6/uL (ref 4.20–5.80)
RDW: 13 % (ref 11.0–15.0)
Total Lymphocyte: 20.4 %
WBC: 9.3 10*3/uL (ref 3.8–10.8)

## 2020-09-29 LAB — VITAMIN D 25 HYDROXY (VIT D DEFICIENCY, FRACTURES): Vit D, 25-Hydroxy: 97 ng/mL (ref 30–100)

## 2020-09-29 LAB — HEMOGLOBIN A1C
Hgb A1c MFr Bld: 5.7 % of total Hgb — ABNORMAL HIGH (ref <5.7)
Mean Plasma Glucose: 117 mg/dL
eAG (mmol/L): 6.5 mmol/L

## 2020-09-29 LAB — LIPID PANEL
Cholesterol: 109 mg/dL (ref <200)
HDL: 36 mg/dL — ABNORMAL LOW (ref >=40)
LDL Cholesterol (Calc): 49 mg/dL (calc)
Non-HDL Cholesterol (Calc): 73 mg/dL (calc) (ref <130)
Total CHOL/HDL Ratio: 3 (calc) (ref <5.0)
Triglycerides: 165 mg/dL — ABNORMAL HIGH (ref <150)

## 2020-09-29 LAB — COMPLETE METABOLIC PANEL WITH GFR
AG Ratio: 1.5 (calc) (ref 1.0–2.5)
ALT: 15 U/L (ref 9–46)
AST: 15 U/L (ref 10–35)
Albumin: 3.7 g/dL (ref 3.6–5.1)
Alkaline phosphatase (APISO): 81 U/L (ref 35–144)
BUN: 16 mg/dL (ref 7–25)
CO2: 29 mmol/L (ref 20–32)
Calcium: 9 mg/dL (ref 8.6–10.3)
Chloride: 105 mmol/L (ref 98–110)
Creat: 1.05 mg/dL (ref 0.70–1.11)
GFR, Est African American: 74 mL/min/{1.73_m2} (ref >=60)
GFR, Est Non African American: 64 mL/min/{1.73_m2} (ref >=60)
Globulin: 2.5 g/dL (calc) (ref 1.9–3.7)
Glucose, Bld: 128 mg/dL — ABNORMAL HIGH (ref 65–99)
Potassium: 3.9 mmol/L (ref 3.5–5.3)
Sodium: 143 mmol/L (ref 135–146)
Total Bilirubin: 0.7 mg/dL (ref 0.2–1.2)
Total Protein: 6.2 g/dL (ref 6.1–8.1)

## 2020-09-29 LAB — MAGNESIUM: Magnesium: 1.9 mg/dL (ref 1.5–2.5)

## 2020-09-29 LAB — TSH: TSH: 1.63 mIU/L (ref 0.40–4.50)

## 2020-09-29 LAB — INSULIN, RANDOM: Insulin: 44.9 u[IU]/mL — ABNORMAL HIGH

## 2020-09-29 NOTE — Progress Notes (Signed)
============================================================ ============================================================  -    Total Chol = 109 and LDL Chol = 49 - Both  Excellent   - Very low risk for Heart Attack  / Stroke ============================================================ ============================================================  - A1c = 5.7% - is essentially Normal & OK  ============================================================ ============================================================  - Vitamin D = 97 - Excellent  - Please keep dose same  ============================================================ ============================================================  - All Else - CBC - Kidneys - Electrolytes - Liver - Magnesium & Thyroid    - all  Normal / OK ============================================================ ============================================================  - Keep up the [place] Work  ! ============================================================ ============================================================

## 2020-09-30 ENCOUNTER — Encounter: Payer: Self-pay | Admitting: *Deleted

## 2020-11-18 DIAGNOSIS — Z85828 Personal history of other malignant neoplasm of skin: Secondary | ICD-10-CM | POA: Diagnosis not present

## 2020-11-18 DIAGNOSIS — D1801 Hemangioma of skin and subcutaneous tissue: Secondary | ICD-10-CM | POA: Diagnosis not present

## 2020-11-18 DIAGNOSIS — L570 Actinic keratosis: Secondary | ICD-10-CM | POA: Diagnosis not present

## 2020-11-18 DIAGNOSIS — D692 Other nonthrombocytopenic purpura: Secondary | ICD-10-CM | POA: Diagnosis not present

## 2020-11-18 DIAGNOSIS — L821 Other seborrheic keratosis: Secondary | ICD-10-CM | POA: Diagnosis not present

## 2020-11-18 DIAGNOSIS — D0462 Carcinoma in situ of skin of left upper limb, including shoulder: Secondary | ICD-10-CM | POA: Diagnosis not present

## 2020-11-18 DIAGNOSIS — L814 Other melanin hyperpigmentation: Secondary | ICD-10-CM | POA: Diagnosis not present

## 2020-11-18 DIAGNOSIS — D045 Carcinoma in situ of skin of trunk: Secondary | ICD-10-CM | POA: Diagnosis not present

## 2021-01-25 ENCOUNTER — Other Ambulatory Visit: Payer: Self-pay | Admitting: Internal Medicine

## 2021-01-25 DIAGNOSIS — J449 Chronic obstructive pulmonary disease, unspecified: Secondary | ICD-10-CM

## 2021-01-25 DIAGNOSIS — M0579 Rheumatoid arthritis with rheumatoid factor of multiple sites without organ or systems involvement: Secondary | ICD-10-CM

## 2021-01-25 MED ORDER — PREDNISONE 5 MG PO TABS
ORAL_TABLET | ORAL | 1 refills | Status: DC
Start: 2021-01-25 — End: 2021-11-01

## 2021-01-25 MED ORDER — PREDNISONE 5 MG PO TABS: ORAL_TABLET | ORAL | 1 refills | Status: DC

## 2021-03-05 ENCOUNTER — Encounter (HOSPITAL_COMMUNITY): Payer: Self-pay

## 2021-03-05 ENCOUNTER — Other Ambulatory Visit: Payer: Self-pay

## 2021-03-05 ENCOUNTER — Emergency Department (HOSPITAL_COMMUNITY): Payer: PPO

## 2021-03-05 ENCOUNTER — Inpatient Hospital Stay (HOSPITAL_COMMUNITY)
Admission: EM | Admit: 2021-03-05 | Discharge: 2021-03-07 | DRG: 262 | Disposition: A | Discharge status: Home / Self Care | Payer: PPO | Attending: Internal Medicine | Admitting: Internal Medicine

## 2021-03-05 DIAGNOSIS — Z981 Arthrodesis status: Secondary | ICD-10-CM

## 2021-03-05 DIAGNOSIS — Z87891 Personal history of nicotine dependence: Secondary | ICD-10-CM | POA: Diagnosis not present

## 2021-03-05 DIAGNOSIS — E559 Vitamin D deficiency, unspecified: Secondary | ICD-10-CM | POA: Diagnosis present

## 2021-03-05 DIAGNOSIS — Z880 Allergy status to penicillin: Secondary | ICD-10-CM

## 2021-03-05 DIAGNOSIS — R55 Syncope and collapse: Secondary | ICD-10-CM

## 2021-03-05 DIAGNOSIS — I10 Essential (primary) hypertension: Secondary | ICD-10-CM | POA: Diagnosis present

## 2021-03-05 DIAGNOSIS — I700 Atherosclerosis of aorta: Secondary | ICD-10-CM | POA: Diagnosis not present

## 2021-03-05 DIAGNOSIS — J45909 Unspecified asthma, uncomplicated: Secondary | ICD-10-CM | POA: Diagnosis present

## 2021-03-05 DIAGNOSIS — M069 Rheumatoid arthritis, unspecified: Secondary | ICD-10-CM | POA: Diagnosis not present

## 2021-03-05 DIAGNOSIS — R0902 Hypoxemia: Secondary | ICD-10-CM | POA: Diagnosis not present

## 2021-03-05 DIAGNOSIS — R001 Bradycardia, unspecified: Principal | ICD-10-CM | POA: Diagnosis present

## 2021-03-05 DIAGNOSIS — Z888 Allergy status to other drugs, medicaments and biological substances status: Secondary | ICD-10-CM | POA: Diagnosis not present

## 2021-03-05 DIAGNOSIS — E669 Obesity, unspecified: Secondary | ICD-10-CM | POA: Diagnosis present

## 2021-03-05 DIAGNOSIS — N400 Enlarged prostate without lower urinary tract symptoms: Secondary | ICD-10-CM | POA: Diagnosis not present

## 2021-03-05 DIAGNOSIS — E785 Hyperlipidemia, unspecified: Secondary | ICD-10-CM | POA: Diagnosis not present

## 2021-03-05 DIAGNOSIS — Z79899 Other long term (current) drug therapy: Secondary | ICD-10-CM

## 2021-03-05 DIAGNOSIS — Z20822 Contact with and (suspected) exposure to covid-19: Secondary | ICD-10-CM | POA: Diagnosis not present

## 2021-03-05 DIAGNOSIS — Z8249 Family history of ischemic heart disease and other diseases of the circulatory system: Secondary | ICD-10-CM

## 2021-03-05 DIAGNOSIS — E876 Hypokalemia: Secondary | ICD-10-CM | POA: Diagnosis present

## 2021-03-05 DIAGNOSIS — Z881 Allergy status to other antibiotic agents status: Secondary | ICD-10-CM | POA: Diagnosis not present

## 2021-03-05 DIAGNOSIS — J449 Chronic obstructive pulmonary disease, unspecified: Secondary | ICD-10-CM | POA: Diagnosis not present

## 2021-03-05 DIAGNOSIS — Z6832 Body mass index (BMI) 32.0-32.9, adult: Secondary | ICD-10-CM

## 2021-03-05 DIAGNOSIS — K219 Gastro-esophageal reflux disease without esophagitis: Secondary | ICD-10-CM | POA: Diagnosis not present

## 2021-03-05 DIAGNOSIS — R29818 Other symptoms and signs involving the nervous system: Secondary | ICD-10-CM | POA: Diagnosis not present

## 2021-03-05 DIAGNOSIS — R4020 Unspecified coma: Secondary | ICD-10-CM | POA: Diagnosis not present

## 2021-03-05 LAB — URINALYSIS, ROUTINE W REFLEX MICROSCOPIC
Bacteria, UA: NONE SEEN
Bilirubin Urine: NEGATIVE
Glucose, UA: NEGATIVE mg/dL
Hgb urine dipstick: NEGATIVE
Ketones, ur: NEGATIVE mg/dL
Nitrite: NEGATIVE
Protein, ur: NEGATIVE mg/dL
Specific Gravity, Urine: 1.006 (ref 1.005–1.030)
pH: 6 (ref 5.0–8.0)

## 2021-03-05 LAB — CBC WITH DIFFERENTIAL/PLATELET
Abs Immature Granulocytes: 0.08 10*3/uL — ABNORMAL HIGH (ref 0.00–0.07)
Basophils Absolute: 0 10*3/uL (ref 0.0–0.1)
Basophils Relative: 0 %
Eosinophils Absolute: 0 10*3/uL (ref 0.0–0.5)
Eosinophils Relative: 0 %
HCT: 40.1 % (ref 39.0–52.0)
Hemoglobin: 13.2 g/dL (ref 13.0–17.0)
Immature Granulocytes: 1 %
Lymphocytes Relative: 8 %
Lymphs Abs: 0.8 10*3/uL (ref 0.7–4.0)
MCH: 30.3 pg (ref 26.0–34.0)
MCHC: 32.9 g/dL (ref 30.0–36.0)
MCV: 92.2 fL (ref 80.0–100.0)
Monocytes Absolute: 0.6 10*3/uL (ref 0.1–1.0)
Monocytes Relative: 5 %
Neutro Abs: 9.4 10*3/uL — ABNORMAL HIGH (ref 1.7–7.7)
Neutrophils Relative %: 86 %
Platelets: 189 10*3/uL (ref 150–400)
RBC: 4.35 MIL/uL (ref 4.22–5.81)
RDW: 12.9 % (ref 11.5–15.5)
WBC: 11 10*3/uL — ABNORMAL HIGH (ref 4.0–10.5)
nRBC: 0 % (ref 0.0–0.2)

## 2021-03-05 LAB — COMPREHENSIVE METABOLIC PANEL
ALT: 22 U/L (ref 0–44)
AST: 24 U/L (ref 15–41)
Albumin: 3.2 g/dL — ABNORMAL LOW (ref 3.5–5.0)
Alkaline Phosphatase: 60 U/L (ref 38–126)
Anion gap: 8 (ref 5–15)
BUN: 13 mg/dL (ref 8–23)
CO2: 27 mmol/L (ref 22–32)
Calcium: 8.6 mg/dL — ABNORMAL LOW (ref 8.9–10.3)
Chloride: 105 mmol/L (ref 98–111)
Creatinine, Ser: 1.2 mg/dL (ref 0.61–1.24)
GFR, Estimated: 59 mL/min — ABNORMAL LOW (ref >60)
Glucose, Bld: 149 mg/dL — ABNORMAL HIGH (ref 70–99)
Potassium: 3.1 mmol/L — ABNORMAL LOW (ref 3.5–5.1)
Sodium: 140 mmol/L (ref 135–145)
Total Bilirubin: 1 mg/dL (ref 0.3–1.2)
Total Protein: 5.8 g/dL — ABNORMAL LOW (ref 6.5–8.1)

## 2021-03-05 LAB — CBG MONITORING, ED: Glucose-Capillary: 144 mg/dL — ABNORMAL HIGH (ref 70–99)

## 2021-03-05 LAB — MAGNESIUM: Magnesium: 1.7 mg/dL (ref 1.7–2.4)

## 2021-03-05 LAB — HEMOGLOBIN A1C
Hgb A1c MFr Bld: 5.7 % — ABNORMAL HIGH (ref 4.8–5.6)
Mean Plasma Glucose: 116.89 mg/dL

## 2021-03-05 LAB — TROPONIN I (HIGH SENSITIVITY)
Troponin I (High Sensitivity): 8 ng/L (ref <18)
Troponin I (High Sensitivity): 8 ng/L (ref <18)

## 2021-03-05 LAB — PROTIME-INR
INR: 1 (ref 0.8–1.2)
Prothrombin Time: 13.5 seconds (ref 11.4–15.2)

## 2021-03-05 LAB — TSH: TSH: 1.052 u[IU]/mL (ref 0.350–4.500)

## 2021-03-05 LAB — RESP PANEL BY RT-PCR (FLU A&B, COVID) ARPGX2
Influenza A by PCR: NEGATIVE
Influenza B by PCR: NEGATIVE
SARS Coronavirus 2 by RT PCR: NEGATIVE

## 2021-03-05 MED ORDER — SODIUM CHLORIDE 0.9 % IV SOLN: INTRAVENOUS | Status: DC

## 2021-03-05 MED ORDER — POTASSIUM CHLORIDE CRYS ER 20 MEQ PO TBCR
40.0000 meq | EXTENDED_RELEASE_TABLET | ORAL | Status: AC
Start: 2021-03-05 — End: 2021-03-05
  Administered 2021-03-05: 40 meq via ORAL
  Filled 2021-03-05: qty 2

## 2021-03-05 MED ORDER — ACETAMINOPHEN 650 MG RE SUPP: 650.0000 mg | Freq: Four times a day (QID) | RECTAL | Status: DC | PRN

## 2021-03-05 MED ORDER — SODIUM CHLORIDE 0.9% FLUSH
3.0000 mL | Freq: Two times a day (BID) | INTRAVENOUS | Status: DC
  Administered 2021-03-05 – 2021-03-06 (×3): 3 mL via INTRAVENOUS

## 2021-03-05 MED ORDER — ROSUVASTATIN CALCIUM 20 MG PO TABS
20.0000 mg | ORAL_TABLET | Freq: Every day | ORAL | Status: DC
  Administered 2021-03-06 – 2021-03-07 (×2): 20 mg via ORAL
  Filled 2021-03-05 (×2): qty 1

## 2021-03-05 MED ORDER — TERAZOSIN HCL 5 MG PO CAPS
10.0000 mg | ORAL_CAPSULE | Freq: Every day | ORAL | Status: DC
  Administered 2021-03-06: 10 mg via ORAL
  Filled 2021-03-05 (×3): qty 2

## 2021-03-05 MED ORDER — SODIUM CHLORIDE 0.9 % IV BOLUS
1000.0000 mL | Freq: Once | INTRAVENOUS | Status: AC
  Administered 2021-03-05: 1000 mL via INTRAVENOUS

## 2021-03-05 MED ORDER — ONDANSETRON HCL 4 MG PO TABS: 4.0000 mg | ORAL_TABLET | Freq: Four times a day (QID) | ORAL | Status: DC | PRN

## 2021-03-05 MED ORDER — HYDRALAZINE HCL 25 MG PO TABS
25.0000 mg | ORAL_TABLET | Freq: Four times a day (QID) | ORAL | Status: DC | PRN
  Administered 2021-03-06 (×2): 25 mg via ORAL
  Filled 2021-03-05 (×2): qty 1

## 2021-03-05 MED ORDER — ONDANSETRON HCL 4 MG/2ML IJ SOLN: 4.0000 mg | Freq: Four times a day (QID) | INTRAMUSCULAR | Status: DC | PRN

## 2021-03-05 MED ORDER — FINASTERIDE 5 MG PO TABS
5.0000 mg | ORAL_TABLET | Freq: Every day | ORAL | Status: DC
  Administered 2021-03-06 – 2021-03-07 (×2): 5 mg via ORAL
  Filled 2021-03-05 (×2): qty 1

## 2021-03-05 MED ORDER — ENOXAPARIN SODIUM 40 MG/0.4ML IJ SOSY
40.0000 mg | PREFILLED_SYRINGE | INTRAMUSCULAR | Status: DC
  Administered 2021-03-05 – 2021-03-06 (×2): 40 mg via SUBCUTANEOUS
  Filled 2021-03-05 (×2): qty 0.4

## 2021-03-05 MED ORDER — IRBESARTAN 150 MG PO TABS
150.0000 mg | ORAL_TABLET | Freq: Every day | ORAL | Status: DC
  Administered 2021-03-06 – 2021-03-07 (×2): 150 mg via ORAL
  Filled 2021-03-05 (×2): qty 1

## 2021-03-05 MED ORDER — PREDNISONE 10 MG PO TABS
10.0000 mg | ORAL_TABLET | Freq: Every day | ORAL | Status: DC
  Administered 2021-03-06 – 2021-03-07 (×2): 10 mg via ORAL
  Filled 2021-03-05 (×2): qty 1

## 2021-03-05 MED ORDER — ACETAMINOPHEN 325 MG PO TABS: 650.0000 mg | ORAL_TABLET | Freq: Four times a day (QID) | ORAL | Status: DC | PRN

## 2021-03-05 NOTE — ED Triage Notes (Signed)
Pt to er room number 5 via ems, per ems pt had a syncopal episode at home, states that he was out, slumped over at the table, states that it took daughter about 5 minutes to awaken him, upon arousal pt was slurring his words and dizzy.  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  Pt moving all extremities, no slurred speech, no facial droop, pt awake and oriented times three

## 2021-03-05 NOTE — Consult Note (Signed)
Electrophysiology consult   Patient ID: [name] MRN: [id number]; DOB: 1931-04-26  Admit date: 03/05/2021 Date of Consult: 03/05/2021  PCP:  [name] Pinto, MD   [facility] Providers Cardiologist:  None    Patient Profile:   [name] is a 87 y.o. male with a hx of BPH, COPD, HTN, RA, GERD who is being seen 03/05/2021 for the evaluation of syncope at the request of [name].  History of Present Illness:   [name] presented to the emergency department today by EMS after his daughter witnessed him pass out at home while playing solitaire.  According to the daughter when she saw him he was slumped over at the table.  The daughter said that it took 5 minutes to wake him up.  He was dizzy when she finally did awaken him.   In the emergency department he was noted to have sinus arrhythmia.  No prolonged pauses.   Past Medical History:  Diagnosis Date   Allergy    Asthma    BPH (benign prostatic hypertrophy)    BPH (benign prostatic hypertrophy)    COPD (chronic obstructive pulmonary disease) (HCC)    DJD (degenerative joint disease)    GERD (gastroesophageal reflux disease)    History of urinary retention    Hypertension    IBS (irritable bowel syndrome)    RBBB    Rheumatoid arthritis(714.0)    [name]. HAWKES   Scrotal lesion    Short of breath on exertion     Past Surgical History:  Procedure Laterality Date   CARDIOVASCULAR STRESS TEST  04-26-2011  [name] BRACKBILL   LOW RISK STRESS NUCLEAR STUDY/ NO EVIDENCE ISCHEMIA   LUMBAR FUSION  X3  LAST ONE 1996   SCROTAL EXPLORATION Left 07/17/2012   Procedure:  EXCISION  OF LEFT SCROTAL SKIN LESION;  Surgeon: Molli Hazard, MD;  Location: [facility];  Service: Urology;  Laterality: Left;   TRANSTHORACIC ECHOCARDIOGRAM  04-26-2011     MILD LVH/ LVSF NORMAL/ EF 26-41%/ GRADE I DIASTOLIC DYSFUNCTION/ MILD MITRAL REGURG.   TRANSURETHRAL RESECTION OF PROSTATE  SEVERAL YRS AGO       Inpatient  Medications: Scheduled Meds:  Continuous Infusions:  sodium chloride     PRN Meds:   Allergies:    Allergies  Allergen Reactions   Ace Inhibitors    Asa [Aspirin]     High Dose asprin   Penicillins Hives   Vasotec [Enalapril]     Cough   Clindamycin/Lincomycin     Hives     Social History:   Social History   Socioeconomic History   Marital status: Married    Spouse name: Not on file   Number of children: Not on file   Years of education: Not on file   Highest education level: Not on file  Occupational History   Occupation: RETIRED    Employer: RETIRED  Tobacco Use   Smoking status: Former    Packs/day: 1.00    Years: 30.00    Pack years: 30.00    Types: Cigarettes    Quit date: 07/11/1980    Years since quitting: 40.6   Smokeless tobacco: Current    Types: Chew  Vaping Use   Vaping Use: Never used  Substance and Sexual Activity   Alcohol use: No   Drug use: No   Sexual activity: Not on file  Other Topics Concern   Not on file  Social History Narrative   Not on file  Social Determinants of Health   Financial Resource Strain: Not on file  Food Insecurity: Not on file  Transportation Needs: Not on file  Physical Activity: Not on file  Stress: Not on file  Social Connections: Not on file  Intimate Partner Violence: Not on file    Family History:    Family History  Problem Relation Age of Onset   Heart disease Father    Colon cancer Father    Hypertension Father    Heart disease Brother    Diabetes Sister      ROS:  Please see the history of present illness.   All other ROS reviewed and negative.     Physical Exam/Data:   Vitals:   03/05/21 1050 03/05/21 1100 03/05/21 1204  BP: (!) 130/57 (!) 116/59 132/72  Pulse: 60 61 61  Resp: 18 13 16   Temp: 97.6 F (36.4 C)  (!) 97.5 F (36.4 C)  TempSrc: Oral  Oral  SpO2: 97% 96% 99%  Weight: 122.5 kg    Height: 6' 4.75" (1.949 m)      Intake/Output Summary (Last 24 hours) at  03/05/2021 1335 Last data filed at 03/05/2021 1330 Gross per 24 hour  Intake 1000 ml  Output --  Net 1000 ml   Last 3 Weights 03/05/2021 09/28/2020 03/09/2020  Weight (lbs) 270 lb 270 lb 12.8 oz 275 lb 9.6 oz  Weight (kg) 122.471 kg 122.834 kg 125.011 kg     Body mass index is 32.23 kg/m.       General:  Well nourished, well developed, in no acute distress HEENT: normal Neck: no JVD Vascular: No carotid bruits; Distal pulses 2+ bilaterally Cardiac:  normal S1, S2; irregular rhythm consistent with sinus arrhythmia; no murmur  Lungs:  clear to auscultation bilaterally, no wheezing, rhonchi or rales  Abd: soft, nontender, no hepatomegaly  Ext: no edema Musculoskeletal:  No deformities, BUE and BLE strength normal and equal Skin: warm and dry  Neuro:  CNs 2-12 intact, no focal abnormalities noted Psych:  Normal affect   EKG:  The EKG was personally reviewed and demonstrates: Right bundle branch block, first-degree AV delay.    Telemetry:  Telemetry was personally reviewed and demonstrates: Sinus arrhythmia.  No evidence of complete heart block.     Relevant CV Studies:  October 16, 2019 echo personally reviewed Left ventricular function normal, 60% Right ventricular function normal No significant valvular abnormalities    EMS records obtained shows sinus arrhythmia.  Right bundle branch block and first-degree AV delay.  No evidence of complete heart block.   Laboratory Data:  High Sensitivity Troponin:   Recent Labs  Lab 03/05/21 1101  TROPONINIHS 8     Chemistry Recent Labs  Lab 03/05/21 1101  NA 140  K 3.1*  CL 105  CO2 27  GLUCOSE 149*  BUN 13  CREATININE 1.20  CALCIUM 8.6*  GFRNONAA 59*  ANIONGAP 8    Recent Labs  Lab 03/05/21 1101  PROT 5.8*  ALBUMIN 3.2*  AST 24  ALT 22  ALKPHOS 60  BILITOT 1.0   Lipids No results for input(s): CHOL, TRIG, HDL, LABVLDL, LDLCALC, CHOLHDL in the last 168 hours.  Hematology Recent Labs  Lab  03/05/21 1101  WBC 11.0*  RBC 4.35  HGB 13.2  HCT 40.1  MCV 92.2  MCH 30.3  MCHC 32.9  RDW 12.9  PLT 189   Thyroid No results for input(s): TSH, FREET4 in the last 168 hours.  BNPNo results for input(s): BNP,  PROBNP in the last 168 hours.  DDimer No results for input(s): DDIMER in the last 168 hours.   Radiology/Studies:  DG Chest 2 View  Result Date: 03/05/2021 CLINICAL DATA:  syncope EXAM: CHEST - 2 VIEW COMPARISON:  September 02, 2019 FINDINGS: The cardiomediastinal silhouette is unchanged in contour. No pleural effusion. No pneumothorax. No acute pleuroparenchymal abnormality. Visualized abdomen is unremarkable. Multilevel degenerative changes of the thoracic spine. IMPRESSION: No acute cardiopulmonary abnormality. Electronically Signed   By: Valentino Saxon M.D.   On: 03/05/2021 11:39   CT HEAD WO CONTRAST  Result Date: 03/05/2021 CLINICAL DATA:  Acute neurologic deficit, rule out stroke EXAM: CT HEAD WITHOUT CONTRAST TECHNIQUE: Contiguous axial images were obtained from the base of the skull through the vertex without intravenous contrast. COMPARISON:  04/04/2012 FINDINGS: Brain: Diffuse parenchymal atrophy. Patchy areas of hypoattenuation in deep and periventricular white matter bilaterally. Stable mild asymmetric parenchymal loss in the left occipital lobe. Stable small anterior falcine lipoma. Negative for acute intracranial hemorrhage, acute infarction, midline shift, or mass-effect. Acute infarct may be inapparent on noncontrast CT. Ventricles and sulci symmetric. Vascular: No hyperdense vessel or unexpected calcification. Skull: Normal. Negative for fracture or focal lesion. Sinuses/Orbits: No acute finding. Other: None IMPRESSION: 1. Negative for bleed or other acute intracranial process. 2. Atrophy and nonspecific white matter changes. Electronically Signed   By: Lucrezia Europe M.D.   On: 03/05/2021 11:48     Assessment and Plan:   Syncope  Highly suspicious for arrhythmia  as the cause given his baseline right bundle branch block and first-degree AV delay.  No observed prolonged pauses or episodes of complete heart block thus far.  EMS records also reviewed show no evidence of complete heart block.  I recommend he be admitted at least for 48 hours of telemetry monitoring.  If no evidence of complete heart block or pauses while admitted, would recommend loop recorder implant prior to discharge.  This has been discussed in detail with the patient and his family who is at the bedside.  EP will follow   For questions or updates, please contact [place] Please consult [email or web address] for contact info under    Signed, Vickie Epley, MD  03/05/2021 1:35 PM

## 2021-03-05 NOTE — ED Notes (Signed)
Pt in bed, cardiology at bedside, requests pads placed on pt and zoll near, crash cart moved outside the door and pt placed on pads

## 2021-03-05 NOTE — H&P (Signed)
History and Physical    [name] UTM:[id number] DOB: 22-Apr-1931 DOA: 03/05/2021  PCP: Unk Pinto, MD (Confirm with patient/family/NH records and if not entered, this has to be entered at [facility] point of entry) Patient coming from: Home  I have personally briefly reviewed patient's old medical records in [place]  Chief Complaint: Feeling better  HPI: [name] is a 87 y.o. male with medical history significant of HTN, HLD, BPH, COPD came with syncope.  Patient was sitting on the computer playing poker game, and started to feel lightheaded "Woozy" and daughter on the other side of the room found the patient "seems falling asleep", but slumped to the side of chair and breathing heavily and not responding.  Eyes closed.  Daughter tried to lie down to get ready for PCR and then the patient started to move his arm randomly and woke up.  Initially with slurred speech and confusion but then recovered for his consciousness, and went to bathroom to urinate.  EMS arrived and found patient heart rate in the 40s.  Patient also reported lately he had several episodes of feeling lightheadedness and blurry vision or recovered on its own.  No chest pains no shortness of breath.  No change of his medication recently.  ED Course: Borderline bradycardia, telemetry monitoring showed 2 episodes of sinus pauses about 1.5-2 seconds each.  EKG showed prolonged PR interval, then another EKG called sinus pause for about 2 seconds.  Review of Systems: As per HPI otherwise 14 point review of systems negative.    Past Medical History:  Diagnosis Date   Allergy    Asthma    BPH (benign prostatic hypertrophy)    BPH (benign prostatic hypertrophy)    COPD (chronic obstructive pulmonary disease) (HCC)    DJD (degenerative joint disease)    GERD (gastroesophageal reflux disease)    History of urinary retention    Hypertension    IBS (irritable bowel syndrome)    RBBB    Rheumatoid  arthritis(714.0)    DR. HAWKES   Scrotal lesion    Short of breath on exertion     Past Surgical History:  Procedure Laterality Date   CARDIOVASCULAR STRESS TEST  04-26-2011  DR BRACKBILL   LOW RISK STRESS NUCLEAR STUDY/ NO EVIDENCE ISCHEMIA   LUMBAR FUSION  X3  LAST ONE 1996   SCROTAL EXPLORATION Left 07/17/2012   Procedure:  EXCISION  OF LEFT SCROTAL SKIN LESION;  Surgeon: Molli Hazard, MD;  Location: [facility];  Service: Urology;  Laterality: Left;   TRANSTHORACIC ECHOCARDIOGRAM  04-26-2011     MILD LVH/ LVSF NORMAL/ EF 56-81%/ GRADE I DIASTOLIC DYSFUNCTION/ MILD MITRAL REGURG.   TRANSURETHRAL RESECTION OF PROSTATE  SEVERAL YRS AGO     reports that he quit smoking about 40 years ago. His smoking use included cigarettes. He has a 30.00 pack-year smoking history. His smokeless tobacco use includes chew. He reports that he does not drink alcohol and does not use drugs.  Allergies  Allergen Reactions   Ace Inhibitors    Asa [Aspirin]     High Dose asprin   Penicillins Hives   Vasotec [Enalapril]     Cough   Clindamycin/Lincomycin     Hives     Family History  Problem Relation Age of Onset   Heart disease Father    Colon cancer Father    Hypertension Father    Heart disease Brother    Diabetes Sister  Prior to Admission medications   Medication Sig Start Date End Date Taking? Authorizing Provider  Cholecalciferol (VITAMIN D) 125 MCG (5000 UT) CAPS Take 5,000 Units by mouth in the morning and at bedtime.   Yes [provider]  finasteride (PROSCAR) 5 MG tablet Take  1 tablet  Daily  for Prostate Patient taking differently: Take 5 mg by mouth daily. 09/14/20  Yes Unk Pinto, MD  olmesartan (BENICAR) 20 MG tablet Take 1 tablet  Daily  for BP Patient taking differently: Take 20 mg by mouth daily. 09/14/20  Yes Unk Pinto, MD  predniSONE (DELTASONE) 5 MG tablet Take 1 tablet 2 x / day or as directed for Asthma & Rheumatoid  Arthritis Patient taking differently: Take 10 mg by mouth daily. 01/25/21  Yes Unk Pinto, MD  rosuvastatin (CRESTOR) 20 MG tablet Take  1 tablet  Daily  for Cholesterol Patient taking differently: Take 20 mg by mouth daily. 08/20/20  Yes Unk Pinto, MD  terazosin (HYTRIN) 10 MG capsule Take  1 capsule  at Bedtime  for Prostate Patient taking differently: Take 10 mg by mouth at bedtime. 09/14/20  Yes Unk Pinto, MD    Physical Exam: Vitals:   03/05/21 1245 03/05/21 1300 03/05/21 1315 03/05/21 1330  BP: 130/62 (!) 143/74 139/66 (!) 142/59  Pulse: 61 60 60 65  Resp: 12 14 15 14   Temp:      TempSrc:      SpO2: 100% 98% 100% 99%  Weight:      Height:        Constitutional: NAD, calm, comfortable Vitals:   03/05/21 1245 03/05/21 1300 03/05/21 1315 03/05/21 1330  BP: 130/62 (!) 143/74 139/66 (!) 142/59  Pulse: 61 60 60 65  Resp: 12 14 15 14   Temp:      TempSrc:      SpO2: 100% 98% 100% 99%  Weight:      Height:       Eyes: PERRL, lids and conjunctivae normal ENMT: Mucous membranes are moist. Posterior pharynx clear of any exudate or lesions.Normal dentition.  Neck: normal, supple, no masses, no thyromegaly Respiratory: clear to auscultation bilaterally, no wheezing, no crackles. Normal respiratory effort. No accessory muscle use.  Cardiovascular: Regular rate and rhythm, no murmurs / rubs / gallops. No extremity edema. 2+ pedal pulses. No carotid bruits.  Abdomen: no tenderness, no masses palpated. No hepatosplenomegaly. Bowel sounds positive.  Musculoskeletal: no clubbing / cyanosis. No joint deformity upper and lower extremities. Good ROM, no contractures. Normal muscle tone.  Skin: no rashes, lesions, ulcers. No induration Neurologic: CN 2-12 grossly intact. Sensation intact, DTR normal. Strength 5/5 in all 4.  Psychiatric: Normal judgment and insight. Alert and oriented x 3. Normal mood.     Labs on Admission: I have personally reviewed following labs and  imaging studies  CBC: Recent Labs  Lab 03/05/21 1101  WBC 11.0*  NEUTROABS 9.4*  HGB 13.2  HCT 40.1  MCV 92.2  PLT 762   Basic Metabolic Panel: Recent Labs  Lab 03/05/21 1101  NA 140  K 3.1*  CL 105  CO2 27  GLUCOSE 149*  BUN 13  CREATININE 1.20  CALCIUM 8.6*   GFR: Estimated Creatinine Clearance: 62.6 mL/min (by C-G formula based on SCr of 1.2 mg/dL). Liver Function Tests: Recent Labs  Lab 03/05/21 1101  AST 24  ALT 22  ALKPHOS 60  BILITOT 1.0  PROT 5.8*  ALBUMIN 3.2*   No results for input(s): LIPASE, AMYLASE in the last  168 hours. No results for input(s): AMMONIA in the last 168 hours. Coagulation Profile: Recent Labs  Lab 03/05/21 1101  INR 1.0   Cardiac Enzymes: No results for input(s): CKTOTAL, CKMB, CKMBINDEX, TROPONINI in the last 168 hours. BNP (last 3 results) No results for input(s): PROBNP in the last 8760 hours. HbA1C: No results for input(s): HGBA1C in the last 72 hours. CBG: Recent Labs  Lab 03/05/21 1045  GLUCAP 144*   Lipid Profile: No results for input(s): CHOL, HDL, LDLCALC, TRIG, CHOLHDL, LDLDIRECT in the last 72 hours. Thyroid Function Tests: No results for input(s): TSH, T4TOTAL, FREET4, T3FREE, THYROIDAB in the last 72 hours. Anemia Panel: No results for input(s): VITAMINB12, FOLATE, FERRITIN, TIBC, IRON, RETICCTPCT in the last 72 hours. Urine analysis:    Component Value Date/Time   COLORURINE YELLOW 03/09/2020 1416   APPEARANCEUR CLEAR 03/09/2020 1416   LABSPEC 1.024 03/09/2020 1416   PHURINE 6.0 03/09/2020 1416   GLUCOSEU NEGATIVE 03/09/2020 1416   [place] 03/09/2020 [place] 06/07/2016 1055   KETONESUR NEGATIVE 03/09/2020 1416   PROTEINUR TRACE (A) 03/09/2020 1416   UROBILINOGEN 0.2 12/27/2008 1005   NITRITE NEGATIVE 03/09/2020 1416   LEUKOCYTESUR NEGATIVE 03/09/2020 1416    Radiological Exams on Admission: DG Chest 2 View  Result Date: 03/05/2021 CLINICAL DATA:  syncope EXAM:  CHEST - 2 VIEW COMPARISON:  September 02, 2019 FINDINGS: The cardiomediastinal silhouette is unchanged in contour. No pleural effusion. No pneumothorax. No acute pleuroparenchymal abnormality. Visualized abdomen is unremarkable. Multilevel degenerative changes of the thoracic spine. IMPRESSION: No acute cardiopulmonary abnormality. Electronically Signed   By: Valentino Saxon M.D.   On: 03/05/2021 11:39   CT HEAD WO CONTRAST  Result Date: 03/05/2021 CLINICAL DATA:  Acute neurologic deficit, rule out stroke EXAM: CT HEAD WITHOUT CONTRAST TECHNIQUE: Contiguous axial images were obtained from the base of the skull through the vertex without intravenous contrast. COMPARISON:  04/04/2012 FINDINGS: Brain: Diffuse parenchymal atrophy. Patchy areas of hypoattenuation in deep and periventricular white matter bilaterally. Stable mild asymmetric parenchymal loss in the left occipital lobe. Stable small anterior falcine lipoma. Negative for acute intracranial hemorrhage, acute infarction, midline shift, or mass-effect. Acute infarct may be inapparent on noncontrast CT. Ventricles and sulci symmetric. Vascular: No hyperdense vessel or unexpected calcification. Skull: Normal. Negative for fracture or focal lesion. Sinuses/Orbits: No acute finding. Other: None IMPRESSION: 1. Negative for bleed or other acute intracranial process. 2. Atrophy and nonspecific white matter changes. Electronically Signed   By: Lucrezia Europe M.D.   On: 03/05/2021 11:48    EKG: Independently reviewed.   Assessment/Plan Active Problems:   Syncope   Bradycardia  (please populate well all problems here in Problem List. (For example, if patient is on BP meds at home and you resume or decide to hold them, it is a problem that needs to be her. Same for CAD, COPD, HLD and so on)  Syncope -Likely secondary to Mobitz type II secondary degree AV block, PPM indicated.  Cardiology consulted, will come down to see the patient emergently. -Admit to PCU for  close monitoring, as needed atropine. -NPO in case cardio will take patient for emergency PPM.  Hypokalemia -P.o. replacement, check magnesium level.  Elevated glucose -Be reactive, no history of diabetes, check A1c.  Leukocytosis -Likely reactive, denies any respiratory, urination or GI symptoms.  UA pending.  Monitor off antibiotics.  HTN -Stable, continue ARB.  RA -Failed DMARDS, continue steroid.  DVT prophylaxis: Lovenox Code Status: Full code Family  Communication: Daughter at bedside Disposition Plan: Expect more than 2 midnight hospital stay to allow PPM evaluation. Consults called: Cardiology Dr. Quentin Ore Admission status: PCU   Lequita Halt MD [facility] Pager [phone]  03/05/2021, 1:40 PM

## 2021-03-05 NOTE — ED Notes (Signed)
Attempted to call report, advised by secretary they are currently having issues with purple man and that the RN is currently with another pt. Call back number was left.

## 2021-03-05 NOTE — ED Notes (Signed)
Pt in ct 

## 2021-03-05 NOTE — ED Provider Notes (Addendum)
[facility]ERGENCY DEPARTMENT Provider Note   CSN: [id number] Arrival date & time: 03/05/21  1035     History Chief Complaint  Patient presents with   Loss of Consciousness    [name] is a 87 y.o. male.  Pt presents to the ED today with a syncopal event.  Pt was on his computer playing solitaire.  Pt's daughter brought her mom into the room and noticed that this patient was slumped over to the side and did not appear to be breathing.  His daughter is a Marine scientist and said he finally took a breath, but his breathing rate was very slow.  Pt's daughter said she called EMS and tried to wake him up.  It took about 5 minutes and he finally awoke.  She said he took a very deep breath right before he woke up.  He was initially confused and combative.  He was slurring his words.  However, these sx have now resolved.  He said he remembers feeling a little dizzy, but otherwise was fine today.  No cp or sob now.  No slurred speech or difficulty moving his arms or legs.      Past Medical History:  Diagnosis Date   Allergy    Asthma    BPH (benign prostatic hypertrophy)    BPH (benign prostatic hypertrophy)    COPD (chronic obstructive pulmonary disease) (HCC)    DJD (degenerative joint disease)    GERD (gastroesophageal reflux disease)    History of urinary retention    Hypertension    IBS (irritable bowel syndrome)    RBBB    Rheumatoid arthritis(714.0)    DR. HAWKES   Scrotal lesion    Short of breath on exertion     Patient Active Problem List   Diagnosis Date Noted   Aortic atherosclerosis ([place]) by CT scan 2011 03/08/2020   Dyspnea 09/10/2019   Morbid obesity ([place]) 02/23/2015   Vitamin D deficiency 04/15/2014   Medication management 04/15/2014   Essential hypertension 04/08/2013   Mixed hyperlipidemia 04/08/2013   Other abnormal glucose 04/08/2013   GERD (gastroesophageal reflux disease)    DJD (degenerative joint disease)    IBS (irritable bowel  syndrome)    COPD (chronic obstructive pulmonary disease) (HCC)    Benign prostatic hyperplasia    Asthma    Rheumatoid arthritis ([place]) 04/20/2011    Past Surgical History:  Procedure Laterality Date   CARDIOVASCULAR STRESS TEST  04-26-2011  DR BRACKBILL   LOW RISK STRESS NUCLEAR STUDY/ NO EVIDENCE ISCHEMIA   LUMBAR FUSION  X3  LAST ONE 1996   SCROTAL EXPLORATION Left 07/17/2012   Procedure:  EXCISION  OF LEFT SCROTAL SKIN LESION;  Surgeon: Molli Hazard, MD;  Location: [facility];  Service: Urology;  Laterality: Left;   TRANSTHORACIC ECHOCARDIOGRAM  04-26-2011     MILD LVH/ LVSF NORMAL/ EF 86-76%/ GRADE I DIASTOLIC DYSFUNCTION/ MILD MITRAL REGURG.   TRANSURETHRAL RESECTION OF PROSTATE  SEVERAL YRS AGO       Family History  Problem Relation Age of Onset   Heart disease Father    Colon cancer Father    Hypertension Father    Heart disease Brother    Diabetes Sister     Social History   Tobacco Use   Smoking status: Former    Packs/day: 1.00    Years: 30.00    Pack years: 30.00    Types: Cigarettes    Quit date: 07/11/1980  Years since quitting: 40.6   Smokeless tobacco: Current    Types: Chew  Vaping Use   Vaping Use: Never used  Substance Use Topics   Alcohol use: No   Drug use: No    Home Medications Prior to Admission medications   Medication Sig Start Date End Date Taking? Authorizing Provider  Cholecalciferol (VITAMIN D) 125 MCG (5000 UT) CAPS Take 5,000 Units by mouth in the morning and at bedtime.   Yes [provider]  finasteride (PROSCAR) 5 MG tablet Take  1 tablet  Daily  for Prostate Patient taking differently: Take 5 mg by mouth daily. 09/14/20  Yes Unk Pinto, MD  olmesartan (BENICAR) 20 MG tablet Take 1 tablet  Daily  for BP Patient taking differently: Take 20 mg by mouth daily. 09/14/20  Yes Unk Pinto, MD  predniSONE (DELTASONE) 5 MG tablet Take 1 tablet 2 x / day or as directed for Asthma & Rheumatoid  Arthritis Patient taking differently: Take 10 mg by mouth daily. 01/25/21  Yes Unk Pinto, MD  rosuvastatin (CRESTOR) 20 MG tablet Take  1 tablet  Daily  for Cholesterol Patient taking differently: Take 20 mg by mouth daily. 08/20/20  Yes Unk Pinto, MD  terazosin (HYTRIN) 10 MG capsule Take  1 capsule  at Bedtime  for Prostate Patient taking differently: Take 10 mg by mouth at bedtime. 09/14/20  Yes Unk Pinto, MD    Allergies    Ace inhibitors, Asa [aspirin], Penicillins, Vasotec [enalapril], and Clindamycin/lincomycin  Review of Systems   Review of Systems  Neurological:  Positive for dizziness and syncope.  All other systems reviewed and are negative.  Physical Exam Updated Vital Signs BP 132/72 (BP Location: Left Arm)   Pulse 61   Temp (!) 97.5 F (36.4 C) (Oral)   Resp 16   Ht 6' 4.75" (1.949 m)   Wt 122.5 kg   SpO2 99%   BMI 32.23 kg/m   Physical Exam Vitals and nursing note reviewed.  Constitutional:      Appearance: Normal appearance.  HENT:     Head: Normocephalic and atraumatic.     Right Ear: External ear normal.     Left Ear: External ear normal.     Nose: Nose normal.     Mouth/Throat:     Mouth: Mucous membranes are moist.     Pharynx: Oropharynx is clear.  Eyes:     Extraocular Movements: Extraocular movements intact.     Conjunctiva/sclera: Conjunctivae normal.     Pupils: Pupils are equal, round, and reactive to light.  Cardiovascular:     Rate and Rhythm: Normal rate and regular rhythm.     Pulses: Normal pulses.     Heart sounds: Normal heart sounds.  Pulmonary:     Effort: Pulmonary effort is normal.     Breath sounds: Normal breath sounds.  Abdominal:     General: Abdomen is flat. Bowel sounds are normal.     Palpations: Abdomen is soft.  Musculoskeletal:        General: Normal range of motion.     Cervical back: Normal range of motion and neck supple.  Skin:    General: Skin is warm.     Capillary Refill: Capillary refill  takes less than 2 seconds.  Neurological:     General: No focal deficit present.     Mental Status: He is alert and oriented to person, place, and time.  Psychiatric:        Mood and Affect:  Mood normal.        Behavior: Behavior normal.    ED Results / Procedures / Treatments   Labs (all labs ordered are listed, but only abnormal results are displayed) Labs Reviewed  CBC WITH DIFFERENTIAL/PLATELET - Abnormal; Notable for the following components:      Result Value   WBC 11.0 (*)    Neutro Abs 9.4 (*)    Abs Immature Granulocytes 0.08 (*)    All other components within normal limits  COMPREHENSIVE METABOLIC PANEL - Abnormal; Notable for the following components:   Potassium 3.1 (*)    Glucose, Bld 149 (*)    Calcium 8.6 (*)    Total Protein 5.8 (*)    Albumin 3.2 (*)    GFR, Estimated 59 (*)    All other components within normal limits  CBG MONITORING, ED - Abnormal; Notable for the following components:   Glucose-Capillary 144 (*)    All other components within normal limits  RESP PANEL BY RT-PCR (FLU A&B, COVID) [id number]  PROTIME-INR  URINALYSIS, ROUTINE W REFLEX MICROSCOPIC  TROPONIN I (HIGH SENSITIVITY)  TROPONIN I (HIGH SENSITIVITY)    EKG EKG Interpretation  Date/Time:  Saturday March 05 2021 10:47:10 EDT Ventricular Rate:  58 PR Interval:  303 QRS Duration: 165 QT Interval:  449 QTC Calculation: 441 R Axis:   61 Text Interpretation: Sinus rhythm Prolonged PR interval Right bundle branch block Since last tracing rate slower Confirmed by Isla Pence [phone]) on 03/05/2021 11:37:05 AM  Radiology DG Chest 2 View  Result Date: 03/05/2021 CLINICAL DATA:  syncope EXAM: CHEST - 2 VIEW COMPARISON:  September 02, 2019 FINDINGS: The cardiomediastinal silhouette is unchanged in contour. No pleural effusion. No pneumothorax. No acute pleuroparenchymal abnormality. Visualized abdomen is unremarkable. Multilevel degenerative changes of the thoracic spine. IMPRESSION: No  acute cardiopulmonary abnormality. Electronically Signed   By: Valentino Saxon M.D.   On: 03/05/2021 11:39   CT HEAD WO CONTRAST  Result Date: 03/05/2021 CLINICAL DATA:  Acute neurologic deficit, rule out stroke EXAM: CT HEAD WITHOUT CONTRAST TECHNIQUE: Contiguous axial images were obtained from the base of the skull through the vertex without intravenous contrast. COMPARISON:  04/04/2012 FINDINGS: Brain: Diffuse parenchymal atrophy. Patchy areas of hypoattenuation in deep and periventricular white matter bilaterally. Stable mild asymmetric parenchymal loss in the left occipital lobe. Stable small anterior falcine lipoma. Negative for acute intracranial hemorrhage, acute infarction, midline shift, or mass-effect. Acute infarct may be inapparent on noncontrast CT. Ventricles and sulci symmetric. Vascular: No hyperdense vessel or unexpected calcification. Skull: Normal. Negative for fracture or focal lesion. Sinuses/Orbits: No acute finding. Other: None IMPRESSION: 1. Negative for bleed or other acute intracranial process. 2. Atrophy and nonspecific white matter changes. Electronically Signed   By: Lucrezia Europe M.D.   On: 03/05/2021 11:48    Procedures Procedures   Medications Ordered in ED Medications  sodium chloride 0.9 % bolus 1,000 mL (1,000 mLs Intravenous New Bag/Given 03/05/21 1157)    And  0.9 %  sodium chloride infusion (has no administration in time range)    ED Course  I have reviewed the triage vital signs and the nursing notes.  Pertinent labs & imaging results that were available during my care of the patient were reviewed by me and considered in my medical decision making (see chart for details).    MDM Rules/Calculators/A&P  [place] Syncope Score of 5:  High risk  Work up unremarkable.  I suspect an arrhythmia.  Pt is d/w Dr. Roosevelt Locks (triad) for admission.  Pt noted to have some long pauses on the monitor.  I spoke with Dr. Quentin Ore (cards) for  consult.   Final Clinical Impression(s) / ED Diagnoses Final diagnoses:  Syncope, unspecified syncope type    Rx / DC Orders ED Discharge Orders     None        Isla Pence, MD 03/05/21 [place]    Isla Pence, MD 03/05/21 1321

## 2021-03-06 DIAGNOSIS — R55 Syncope and collapse: Secondary | ICD-10-CM | POA: Diagnosis not present

## 2021-03-06 LAB — BASIC METABOLIC PANEL
Anion gap: 6 (ref 5–15)
BUN: 12 mg/dL (ref 8–23)
CO2: 26 mmol/L (ref 22–32)
Calcium: 8.1 mg/dL — ABNORMAL LOW (ref 8.9–10.3)
Chloride: 108 mmol/L (ref 98–111)
Creatinine, Ser: 1.02 mg/dL (ref 0.61–1.24)
GFR, Estimated: >60 mL/min (ref >60)
Glucose, Bld: 95 mg/dL (ref 70–99)
Potassium: 3.8 mmol/L (ref 3.5–5.1)
Sodium: 140 mmol/L (ref 135–145)

## 2021-03-06 NOTE — Progress Notes (Signed)
PROGRESS NOTE  [name]  DOB: 01-16-1931  PCP: Unk Pinto, MD FMB:[id number]  DOA: 03/05/2021  LOS: 1 day  Hospital Day: 2   Chief Complaint  Patient presents with   Loss of Consciousness    Brief narrative: [name] is a 87 y.o. male with PMH significant for HTN, HLD, RA, BPH, COPD, GERD. Patient was brought to the ED on 10/22 after a syncopal episode at home.  While playing poker in the computer, he suddenly started to feel dizzy, lightheaded and then slumped to the side of the chair, breathing heavily and not responding.  Daughter was able to wake him up in 5 minutes.Marland Kitchen  He initially had slurred speech and confusion but then recovered soon. EMS was called, noted his heart rate to be in 40s.  In the ED, patient was afebrile, heart rate in 60s, blood pressure stable, breathing on room air. Telemetry showed 2 episodes of sinus pauses about 1.5 to 2 seconds each. EKGs showed prolonged PR interval and sinus pause for about 2 seconds Admitted to hospitalist service Cardiology consultation was obtained  Subjective: Patient was seen and examined this morning.  Pleasant elderly Caucasian male.  Propped up in bed.  Not in distress.  No new symptoms. Overnight heart rate in 50s and 60s  Assessment/Plan: Syncope -Arrhythmia seems to be the cause of his syncope. -Cardiology consult appreciated.  Continue to monitor in telemetry for next 24 hours.  -Per cardiology, if no AV block or significant bradycardia, plan is to put a loop implant prior to discharge.  Essential hypertension -PTA on Benicar 150 mg daily, terazosin 10 mg at bedtime -Currently on irbesartan and terazosin. -Continue to monitor blood pressure.  Hydralazine as needed  HLD -Continue Crestor 20 mg daily  Rheumatoid arthritis -Reportedly failed DMARDs. -continue prednisone 10 mg daily  Hypokalemia -Replacement given.  Recheck Recent Labs  Lab 03/05/21 1101 03/05/21 1340 03/06/21 0120  K 3.1*   --  3.8  MG  --  1.7  --    BPH -Proscar 5 mg daily  Mobility: Encourage ambulation Code Status:   Code Status: Full Code  Nutritional status: Body mass index is 32.47 kg/m.     Diet:  Diet Order             Diet Heart Room service appropriate? Yes; Fluid consistency: Thin  Diet effective now                  DVT prophylaxis:  enoxaparin (LOVENOX) injection 40 mg Start: 03/05/21 1345   Antimicrobials: None Fluid: Currently on normal saline 125 mill per hour.  Stop IV fluid.  Encourage oral hydration Consultants: Cardiology Family Communication: None at bedside  Status is: Inpatient  Remains inpatient appropriate because: Syncope work-up.  Continue telemetry monitoring.  May need loop tomorrow  Dispo: The patient is from: Home              Anticipated d/c is to: Hopefully home in 1 to 2 days              Patient currently is not medically stable to d/c.   Difficult to place patient No     Infusions:   sodium chloride 125 mL/hr at 03/05/21 2046    Scheduled Meds:  enoxaparin (LOVENOX) injection  40 mg Subcutaneous Q24H   finasteride  5 mg Oral Daily   irbesartan  150 mg Oral Daily   predniSONE  10 mg Oral Daily   rosuvastatin  20  mg Oral Daily   sodium chloride flush  3 mL Intravenous Q12H   terazosin  10 mg Oral QHS    Antimicrobials: Anti-infectives (From admission, onward)    None       PRN meds: acetaminophen **OR** acetaminophen, hydrALAZINE, ondansetron **OR** ondansetron (ZOFRAN) IV   Objective: Vitals:   03/05/21 2326 03/06/21 0403  BP: (!) 164/62 130/71  Pulse: 76 74  Resp: 17 17  Temp: 98.1 F (36.7 C) 98.4 F (36.9 C)  SpO2: 97% 97%    Intake/Output Summary (Last 24 hours) at 03/06/2021 0812 Last data filed at 03/05/2021 2047 Gross per 24 hour  Intake 1003 ml  Output --  Net 1003 ml   Filed Weights   03/05/21 1050 03/05/21 1822  Weight: 122.5 kg 123.4 kg   Weight change:  Body mass index is 32.47 kg/m.    Physical Exam: General exam: Pleasant, elderly Caucasian male.  Not in distress Skin: No rashes, lesions or ulcers. HEENT: Atraumatic, normocephalic, no obvious bleeding Lungs: Mild audible wheezing.  Patient does not complain of distress however.  Not requiring supplemental oxygen CVS: Regular rate and rhythm, no murmur GI/Abd soft, nontender, nondistended, bowel sound present CNS: Alert, awake, oriented x3 Psychiatry: Mood appropriate Extremities: No pedal edema, no calf tenderness  Data Review: I have personally reviewed the laboratory data and studies available.  Recent Labs  Lab 03/05/21 1101  WBC 11.0*  NEUTROABS 9.4*  HGB 13.2  HCT 40.1  MCV 92.2  PLT 189   Recent Labs  Lab 03/05/21 1101 03/05/21 1340 03/06/21 0120  NA 140  --  140  K 3.1*  --  3.8  CL 105  --  108  CO2 27  --  26  GLUCOSE 149*  --  95  BUN 13  --  12  CREATININE 1.20  --  1.02  CALCIUM 8.6*  --  8.1*  MG  --  1.7  --     F/u labs ordered Unresulted Labs (From admission, onward)    None       Signed, Terrilee Croak, MD [facility] 03/06/2021

## 2021-03-06 NOTE — Progress Notes (Signed)
Pt daughter Darcus Austin (RN) called to speak cardiologist Dr Quentin Ore to find out game plan for him and didn't want him to be discharged until she spoke to his cardiologist.  Phone number she can be reached at is: [phone]  Pt IV was saline locked and he has been hydrating well w/ good urine output. Lungs still have mild wheezing, will continue to monitor.

## 2021-03-06 NOTE — Evaluation (Signed)
Physical Therapy Evaluation Patient Details Name: [name] MRN: [id number] DOB: [birth year] Today's Date: 03/06/2021  History of Present Illness  Patient is a 87 y/o male who presents on 03/06/21 due to syncopal episode witnessed by daughter likely due to second degree AV block. PMH includes COPD, HTN, RBBB.  Clinical Impression  Patient presents with mild balance deficits and impaired mobility s/p above. Pt lives at home with wife who is not able to provide assist. Reports being independent for ADLs and driving PTA; family lives next door and helps care for wife. Today, pt tolerated transfers and gait training with supervision-Mod I for safety. No dizziness reported during activity. HR ranging from 55- low 100s bpm with ambulation. Encouraged walking to bathroom and hallway ambulation to improve/maintain strength/mobility and decrease deconditioning while in the hospital. WIll likely not need follow up pending pt continues to mobilize. Will follow acutely to maximize independence and mobility prior to return home.        Recommendations for follow up therapy are one component of a multi-disciplinary discharge planning process, led by the attending physician.  Recommendations may be updated based on patient status, additional functional criteria and insurance authorization.  Follow Up Recommendations No PT follow up;Supervision - Intermittent    Equipment Recommendations  None recommended by PT    Recommendations for Other Services       Precautions / Restrictions Precautions Precautions: Other (comment);Fall Precaution Comments: watch HR/BP Restrictions Weight Bearing Restrictions: No      Mobility  Bed Mobility Overal bed mobility: Modified Independent             General bed mobility comments: HOB elevated mildly, no assist or use of rails.    Transfers Overall transfer level: Modified independent Equipment used: None             General transfer comment:  Stood from EOB without difficulty, no imbalance.  Ambulation/Gait Ambulation/Gait assistance: Supervision;Min guard Gait Distance (Feet): 150 Feet Assistive device: None Gait Pattern/deviations: Step-through pattern;Decreased stride length;Wide base of support Gait velocity: decreased Gait velocity interpretation: 1.31 - 2.62 ft/sec, indicative of limited community ambulator General Gait Details: SLow, mildly unsteady gait with UEs behind back, no SOB noted. HR 55-low 100s bpm, no dizziness.  Stairs            Wheelchair Mobility    Modified Rankin (Stroke Patients Only)       Balance Overall balance assessment: Needs assistance Sitting-balance support: Feet supported;No upper extremity supported Sitting balance-Leahy Scale: Good     Standing balance support: During functional activity Standing balance-Leahy Scale: Fair                               Pertinent Vitals/Pain Pain Assessment: No/denies pain    Home Living Family/patient expects to be discharged to:: Private residence Living Arrangements: Spouse/significant other Available Help at Discharge: Family;Available PRN/intermittently Type of Home: House Home Access: Ramped entrance     Home Layout: One level Home Equipment: Walker - 2 wheels;Bedside commode;Wheelchair - Brewing technologist - built in      Prior Function Level of Independence: Independent         Comments: Drives, does not do IADLs. Daughter lives next door and helps care for wife.     Hand Dominance   Dominant Hand: Right    Extremity/Trunk Assessment   Upper Extremity Assessment Upper Extremity Assessment: Defer to OT evaluation    Lower Extremity Assessment  Lower Extremity Assessment: Overall WFL for tasks assessed    Cervical / Trunk Assessment Cervical / Trunk Assessment: Normal  Communication   Communication: No difficulties  Cognition Arousal/Alertness: Awake/alert Behavior During Therapy: WFL for  tasks assessed/performed Overall Cognitive Status: Within Functional Limits for tasks assessed                                 General Comments: Flat but laughs appropriately.      General Comments General comments (skin integrity, edema, etc.): HR 55-low 100s bpm during activity. Elevated BP 159/78 supine and 156/102 sitting EOB (RN gave meds prior to session) and BP decreased at end of session.    Exercises     Assessment/Plan    PT Assessment Patient needs continued PT services  PT Problem List Decreased mobility;Decreased balance;Cardiopulmonary status limiting activity       PT Treatment Interventions Therapeutic exercise;Gait training;Patient/family education;Therapeutic activities;Functional mobility training;Balance training    PT Goals (Current goals can be found in the Care Plan section)  Acute Rehab PT Goals Patient Stated Goal: go home PT Goal Formulation: With patient Time For Goal Achievement: 03/20/21 Potential to Achieve Goals: Good    Frequency Min 3X/week   Barriers to discharge        Co-evaluation               AM-PAC PT "6 Clicks" Mobility  Outcome Measure Help needed turning from your back to your side while in a flat bed without using bedrails?: None Help needed moving from lying on your back to sitting on the side of a flat bed without using bedrails?: None Help needed moving to and from a bed to a chair (including a wheelchair)?: A Little Help needed standing up from a chair using your arms (e.g., wheelchair or bedside chair)?: A Little Help needed to walk in hospital room?: A Little Help needed climbing 3-5 steps with a railing? : A Little 6 Click Score: 20    End of Session   Activity Tolerance: Patient tolerated treatment well Patient left: in bed;with call bell/phone within reach Nurse Communication: Mobility status PT Visit Diagnosis: Unsteadiness on feet (R26.81);Difficulty in walking, not elsewhere classified  (R26.2)    Time: 3704-8889 PT Time Calculation (min) (ACUTE ONLY): 19 min   Charges:   PT Evaluation $PT Eval Moderate Complexity: 1 Mod          Marisa Severin, PT, DPT Acute Rehabilitation Services Pager [phone] Office [phone]     Marguarite Arbour A Sabra Heck 03/06/2021, 9:26 AM

## 2021-03-06 NOTE — Progress Notes (Signed)
EP Progress Note  Patient Name: [name] Date of Encounter: 03/06/2021  [facility] Cardiologist: None   Subjective   NAEO. Tele overnight without high degree AV block (only asymptomatic wenckebach observed).   Inpatient Medications    Scheduled Meds:  enoxaparin (LOVENOX) injection  40 mg Subcutaneous Q24H   finasteride  5 mg Oral Daily   irbesartan  150 mg Oral Daily   predniSONE  10 mg Oral Daily   rosuvastatin  20 mg Oral Daily   sodium chloride flush  3 mL Intravenous Q12H   terazosin  10 mg Oral QHS   Continuous Infusions:  sodium chloride 125 mL/hr at 03/05/21 2046   PRN Meds: acetaminophen **OR** acetaminophen, hydrALAZINE, ondansetron **OR** ondansetron (ZOFRAN) IV   Vital Signs    Vitals:   03/05/21 1822 03/05/21 2041 03/05/21 2326 03/06/21 0403  BP:  (!) 154/74 (!) 164/62 130/71  Pulse:  64 76 74  Resp:  19 17 17   Temp:  98.2 F (36.8 C) 98.1 F (36.7 C) 98.4 F (36.9 C)  TempSrc:  Oral Oral Oral  SpO2:  97% 97% 97%  Weight: 123.4 kg     Height:        Intake/Output Summary (Last 24 hours) at 03/06/2021 0751 Last data filed at 03/05/2021 2047 Gross per 24 hour  Intake 1003 ml  Output --  Net 1003 ml   Last 3 Weights 03/05/2021 03/05/2021 09/28/2020  Weight (lbs) 272 lb 0.8 oz 270 lb 270 lb 12.8 oz  Weight (kg) 123.4 kg 122.471 kg 122.834 kg      Telemetry    Sinus, wenckebach (mobitz I)- Personally Reviewed  ECG    Personally Reviewed  Physical Exam   GEN: No acute distress.   Neck: No JVD Cardiac: RRR, no murmurs, rubs, or gallops.  Respiratory: Clear to auscultation bilaterally. GI: Soft, nontender, non-distended  MS: No edema; No deformity. Neuro:  Nonfocal  Psych: Normal affect   Labs    High Sensitivity Troponin:   Recent Labs  Lab 03/05/21 1101 03/05/21 1308  TROPONINIHS 8 8     Chemistry Recent Labs  Lab 03/05/21 1101 03/05/21 1340 03/06/21 0120  NA 140  --  140  K 3.1*  --  3.8  CL 105  --   108  CO2 27  --  26  GLUCOSE 149*  --  95  BUN 13  --  12  CREATININE 1.20  --  1.02  CALCIUM 8.6*  --  8.1*  MG  --  1.7  --   PROT 5.8*  --   --   ALBUMIN 3.2*  --   --   AST 24  --   --   ALT 22  --   --   ALKPHOS 60  --   --   BILITOT 1.0  --   --   GFRNONAA 59*  --  >60  ANIONGAP 8  --  6    Lipids No results for input(s): CHOL, TRIG, HDL, LABVLDL, LDLCALC, CHOLHDL in the last 168 hours.  Hematology Recent Labs  Lab 03/05/21 1101  WBC 11.0*  RBC 4.35  HGB 13.2  HCT 40.1  MCV 92.2  MCH 30.3  MCHC 32.9  RDW 12.9  PLT 189   Thyroid  Recent Labs  Lab 03/05/21 1339  TSH 1.052    BNPNo results for input(s): BNP, PROBNP in the last 168 hours.  DDimer No results for input(s): DDIMER in the last 168 hours.  Radiology    DG Chest 2 View  Result Date: 03/05/2021 CLINICAL DATA:  syncope EXAM: CHEST - 2 VIEW COMPARISON:  September 02, 2019 FINDINGS: The cardiomediastinal silhouette is unchanged in contour. No pleural effusion. No pneumothorax. No acute pleuroparenchymal abnormality. Visualized abdomen is unremarkable. Multilevel degenerative changes of the thoracic spine. IMPRESSION: No acute cardiopulmonary abnormality. Electronically Signed   By: Valentino Saxon M.D.   On: 03/05/2021 11:39   CT HEAD WO CONTRAST  Result Date: 03/05/2021 CLINICAL DATA:  Acute neurologic deficit, rule out stroke EXAM: CT HEAD WITHOUT CONTRAST TECHNIQUE: Contiguous axial images were obtained from the base of the skull through the vertex without intravenous contrast. COMPARISON:  04/04/2012 FINDINGS: Brain: Diffuse parenchymal atrophy. Patchy areas of hypoattenuation in deep and periventricular white matter bilaterally. Stable mild asymmetric parenchymal loss in the left occipital lobe. Stable small anterior falcine lipoma. Negative for acute intracranial hemorrhage, acute infarction, midline shift, or mass-effect. Acute infarct may be inapparent on noncontrast CT. Ventricles and sulci  symmetric. Vascular: No hyperdense vessel or unexpected calcification. Skull: Normal. Negative for fracture or focal lesion. Sinuses/Orbits: No acute finding. Other: None IMPRESSION: 1. Negative for bleed or other acute intracranial process. 2. Atrophy and nonspecific white matter changes. Electronically Signed   By: Lucrezia Europe M.D.   On: 03/05/2021 11:48    Cardiac Studies   No new  Patient Profile     [name] is a 87 y.o. male with a hx of BPH, COPD, HTN, RA, GERD who was seen 03/05/2021 for the evaluation of syncope at the request of Dr Gilford Raid.  Assessment & Plan    #Syncope Unclear cause but suspicious for arrhythmic. No evidence of high degree AV block thus far on telemetry. Will continue to monitor for additional 24 hours. If no AV block or significant bradycardia, plan for loop implant prior to discharge.  EP following.  For questions or updates, please contact [place] Please consult [email or web address] for contact info under        Signed, Vickie Epley, MD  03/06/2021, 7:51 AM

## 2021-03-07 ENCOUNTER — Encounter (HOSPITAL_COMMUNITY)
Admission: EM | Disposition: A | Discharge status: Home / Self Care | Payer: Self-pay | Source: Home / Self Care | Attending: Internal Medicine

## 2021-03-07 DIAGNOSIS — R55 Syncope and collapse: Secondary | ICD-10-CM | POA: Diagnosis not present

## 2021-03-07 DIAGNOSIS — R001 Bradycardia, unspecified: Secondary | ICD-10-CM | POA: Diagnosis not present

## 2021-03-07 HISTORY — PX: LOOP RECORDER INSERTION: EP1214

## 2021-03-07 LAB — BASIC METABOLIC PANEL
Anion gap: 8 (ref 5–15)
BUN: 12 mg/dL (ref 8–23)
CO2: 26 mmol/L (ref 22–32)
Calcium: 8.4 mg/dL — ABNORMAL LOW (ref 8.9–10.3)
Chloride: 105 mmol/L (ref 98–111)
Creatinine, Ser: 1.03 mg/dL (ref 0.61–1.24)
GFR, Estimated: >60 mL/min (ref >60)
Glucose, Bld: 96 mg/dL (ref 70–99)
Potassium: 3.5 mmol/L (ref 3.5–5.1)
Sodium: 139 mmol/L (ref 135–145)

## 2021-03-07 LAB — MAGNESIUM: Magnesium: 1.8 mg/dL (ref 1.7–2.4)

## 2021-03-07 LAB — PHOSPHORUS: Phosphorus: 2.9 mg/dL (ref 2.5–4.6)

## 2021-03-07 SURGERY — LOOP RECORDER INSERTION

## 2021-03-07 MED ORDER — LIDOCAINE-EPINEPHRINE 1 %-1:100000 IJ SOLN
INTRAMUSCULAR | Status: AC
  Filled 2021-03-07: qty 1

## 2021-03-07 MED ORDER — LIDOCAINE-EPINEPHRINE 1 %-1:100000 IJ SOLN
INTRAMUSCULAR | Status: DC | PRN
  Administered 2021-03-07: 20 mL

## 2021-03-07 SURGICAL SUPPLY — 2 items
MONITOR MOBILE MNGR LINQ22 (Prosthesis & Implant Heart) ×1 IMPLANT
PACK LOOP INSERTION (CUSTOM PROCEDURE TRAY) ×2 IMPLANT

## 2021-03-07 NOTE — Progress Notes (Addendum)
Progress Note  Patient Name: [name] Date of Encounter: 03/07/2021  [facility] Cardiologist: None   Subjective   Wants to go home, feels "fine"  Inpatient Medications    Scheduled Meds:  enoxaparin (LOVENOX) injection  40 mg Subcutaneous Q24H   finasteride  5 mg Oral Daily   irbesartan  150 mg Oral Daily   predniSONE  10 mg Oral Daily   rosuvastatin  20 mg Oral Daily   sodium chloride flush  3 mL Intravenous Q12H   terazosin  10 mg Oral QHS   Continuous Infusions:  PRN Meds: acetaminophen **OR** acetaminophen, hydrALAZINE, ondansetron **OR** ondansetron (ZOFRAN) IV   Vital Signs    Vitals:   03/06/21 2010 03/06/21 2324 03/07/21 0347 03/07/21 0900  BP: 138/73 (!) 124/58 131/68 (!) 150/69  Pulse: 60 65 63 66  Resp: 20 13 13 13   Temp: 97.8 F (36.6 C) 97.8 F (36.6 C) 97.8 F (36.6 C) 97.6 F (36.4 C)  TempSrc: Oral Oral Oral Oral  SpO2: 97% 97% 98% 98%  Weight:   125.3 kg   Height:        Intake/Output Summary (Last 24 hours) at 03/07/2021 0940 Last data filed at 03/06/2021 1856 Gross per 24 hour  Intake 960 ml  Output 1850 ml  Net -890 ml   Last 3 Weights 03/07/2021 03/05/2021 03/05/2021  Weight (lbs) 276 lb 3.8 oz 272 lb 0.8 oz 270 lb  Weight (kg) 125.3 kg 123.4 kg 122.471 kg      Telemetry    SB/SR, at times more frequent PACs, occ PVCs, 1st degree AVblock, no arrhythmias, no HR less then the 50's, no advanced heart block - Personally Reviewed  ECG    No new EKGs - Personally Reviewed  Physical Exam   GEN: No acute distress.   Neck: No JVD Cardiac: RRR, no murmurs, rubs, or gallops.  Respiratory: CTA b/l. GI: Soft, nontender, non-distended  MS: No edema; No deformity. Neuro:  Nonfocal  Psych: Normal affect   Labs    High Sensitivity Troponin:   Recent Labs  Lab 03/05/21 1101 03/05/21 1308  TROPONINIHS 8 8     Chemistry Recent Labs  Lab 03/05/21 1101 03/05/21 1340 03/06/21 0120 03/07/21 0302  NA 140  --  140 139   K 3.1*  --  3.8 3.5  CL 105  --  108 105  CO2 27  --  26 26  GLUCOSE 149*  --  95 96  BUN 13  --  12 12  CREATININE 1.20  --  1.02 1.03  CALCIUM 8.6*  --  8.1* 8.4*  MG  --  1.7  --  1.8  PROT 5.8*  --   --   --   ALBUMIN 3.2*  --   --   --   AST 24  --   --   --   ALT 22  --   --   --   ALKPHOS 60  --   --   --   BILITOT 1.0  --   --   --   GFRNONAA 59*  --  >60 >60  ANIONGAP 8  --  6 8    Lipids No results for input(s): CHOL, TRIG, HDL, LABVLDL, LDLCALC, CHOLHDL in the last 168 hours.  Hematology Recent Labs  Lab 03/05/21 1101  WBC 11.0*  RBC 4.35  HGB 13.2  HCT 40.1  MCV 92.2  MCH 30.3  MCHC 32.9  RDW 12.9  PLT  189   Thyroid  Recent Labs  Lab 03/05/21 1339  TSH 1.052    BNPNo results for input(s): BNP, PROBNP in the last 168 hours.  DDimer No results for input(s): DDIMER in the last 168 hours.   Radiology    DG Chest 2 View  Result Date: 03/05/2021 CLINICAL DATA:  syncope EXAM: CHEST - 2 VIEW COMPARISON:  September 02, 2019 FINDINGS: The cardiomediastinal silhouette is unchanged in contour. No pleural effusion. No pneumothorax. No acute pleuroparenchymal abnormality. Visualized abdomen is unremarkable. Multilevel degenerative changes of the thoracic spine. IMPRESSION: No acute cardiopulmonary abnormality. Electronically Signed   By: Valentino Saxon M.D.   On: 03/05/2021 11:39   CT HEAD WO CONTRAST  Result Date: 03/05/2021 CLINICAL DATA:  Acute neurologic deficit, rule out stroke EXAM: CT HEAD WITHOUT CONTRAST TECHNIQUE: Contiguous axial images were obtained from the base of the skull through the vertex without intravenous contrast. COMPARISON:  04/04/2012 FINDINGS: Brain: Diffuse parenchymal atrophy. Patchy areas of hypoattenuation in deep and periventricular white matter bilaterally. Stable mild asymmetric parenchymal loss in the left occipital lobe. Stable small anterior falcine lipoma. Negative for acute intracranial hemorrhage, acute infarction, midline  shift, or mass-effect. Acute infarct may be inapparent on noncontrast CT. Ventricles and sulci symmetric. Vascular: No hyperdense vessel or unexpected calcification. Skull: Normal. Negative for fracture or focal lesion. Sinuses/Orbits: No acute finding. Other: None IMPRESSION: 1. Negative for bleed or other acute intracranial process. 2. Atrophy and nonspecific white matter changes. Electronically Signed   By: Lucrezia Europe M.D.   On: 03/05/2021 11:48    Cardiac Studies   10/16/2019: TTE IMPRESSIONS   1. Normal LV systolic function; grade 1 diastolic dysfunction.   2. Left ventricular ejection fraction, by estimation, is 60 to 65%. The  left ventricle has normal function. The left ventricle has no regional  wall motion abnormalities. Left ventricular diastolic parameters are  consistent with Grade I diastolic  dysfunction (impaired relaxation).   3. Right ventricular systolic function is normal. The right ventricular  size is normal. There is normal pulmonary artery systolic pressure.   4. The mitral valve is normal in structure. No evidence of mitral valve  regurgitation. No evidence of mitral stenosis.   5. The aortic valve is tricuspid. Aortic valve regurgitation is not  visualized. No aortic stenosis is present.   6. The inferior vena cava is normal in size with greater than 50%  respiratory variability, suggesting right atrial pressure of 3 mmHg.   Patient Profile     87 y.o. male w/PMHx of BPHm COPD, HTN, RA, GERD admitted with syncope  Assessment & Plan    Syncope Story suspicious for rhythm/rate etiology Dr. Quentin Ore reviewed EMS data, no advanced heart block RBBB, 1st degree AVblock    Telemetry remains with SR, frequent PACs at times, occ PVCs,  no HRs below the 50's No arrhythmias Asymptomatic Mobitz one described, he has not had any advanced heart block  Discussed with patient rational for loop and the procedure, potential risks and benefits He is agreeable Plan for a  loop implant and out patient follow up  No driving 89mo, discussed with pt and his daughter Discussed with his daughter Darcus Austin     For questions or updates, please contact [place] Please consult [email or web address] for contact info under        Signed, Baldwin Jamaica, PA-C  03/07/2021, 9:40 AM

## 2021-03-07 NOTE — Discharge Summary (Signed)
Physician Discharge Summary  [name] LEX:[id number] DOB: 10/06/1930 DOA: 03/05/2021  PCP: Unk Pinto, MD  Admit date: 03/05/2021 Discharge date: 03/07/2021  Admitted From: Home Discharge disposition: Home   Code Status: Full Code   Discharge Diagnosis:   Active Problems:   Syncope   Bradycardia    Chief Complaint  Patient presents with   Loss of Consciousness    Brief narrative: [name] is a 87 y.o. male with PMH significant for HTN, HLD, RA, BPH, COPD, GERD. Patient was brought to the ED on 10/22 after a syncopal episode at home.  While playing poker in the computer, he suddenly started to feel dizzy, lightheaded and then slumped to the side of the chair, breathing heavily and not responding.  Daughter was able to wake him up in 5 minutes.Marland Kitchen  He initially had slurred speech and confusion but then recovered soon. EMS was called, noted his heart rate to be in 40s.  In the ED, patient was afebrile, heart rate in 60s, blood pressure stable, breathing on room air. Telemetry showed 2 episodes of sinus pauses about 1.5 to 2 seconds each. EKGs showed prolonged PR interval and sinus pause for about 2 seconds Admitted to hospitalist service Cardiology consultation was obtained  Subjective: Patient was seen and examined this morning.  Lying on bed.  Not in distress. Hemodynamically stable.  Hospital course Syncope -Arrhythmia seems to be the cause of his syncope. -Cardiology consult appreciated.  Patient was monitored in telemetry for 24 to 48 hours.  He did not have any evidence of high-grade AV block.  A loop recorder implant was placed.  Patient stable for discharge to home today.  To follow-up with cardiology as an outpatient   Essential hypertension -PTA on Benicar 150 mg daily, terazosin 10 mg at bedtime -Resume the same post discharge.  HLD -Continue Crestor 20 mg daily  Rheumatoid arthritis -Reportedly failed DMARDs. -continue prednisone 10 mg  daily  Hypokalemia -Potassium level on admission was low at 3.1.  Level improved after replacement.    BPH -Proscar 5 mg daily   Allergies as of 03/07/2021       Reactions   Ace Inhibitors    Asa [aspirin]    High Dose asprin   Penicillins Hives   Vasotec [enalapril]    Cough   Clindamycin/lincomycin    Hives        Medication List     TAKE these medications    finasteride 5 MG tablet Commonly known as: PROSCAR Take  1 tablet  Daily  for Prostate What changed:  how much to take how to take this when to take this additional instructions   olmesartan 20 MG tablet Commonly known as: BENICAR Take 1 tablet  Daily  for BP What changed:  how much to take how to take this when to take this additional instructions   predniSONE 5 MG tablet Commonly known as: DELTASONE Take 1 tablet 2 x / day or as directed for Asthma & Rheumatoid Arthritis What changed:  how much to take how to take this when to take this additional instructions   rosuvastatin 20 MG tablet Commonly known as: Crestor Take  1 tablet  Daily  for Cholesterol What changed:  how much to take how to take this when to take this additional instructions   terazosin 10 MG capsule Commonly known as: HYTRIN Take  1 capsule  at Bedtime  for Prostate What changed:  how much to take how to  take this when to take this additional instructions   Vitamin D 125 MCG (5000 UT) Caps Take 5,000 Units by mouth in the morning and at bedtime.        Discharge Instructions:  Diet Recommendation: Cardiac diet   @BRDDSCINSTRUCTIONS @  Follow ups:    Follow-up Information     [place] [facility] Follow up.   Specialty: Cardiology Why: 03/17/21 @ 11:20AM, wound check visit (heart monitor) Contact information: [address], [address] [place]        Shirley Friar, PA-C Follow up.   Specialty: Physician Assistant Why: 04/21/21 @  11:00AM, for Dr. Vonzell Schlatter information: [place] [address] [phone]         Unk Pinto, MD Follow up.   Specialty: Internal Medicine Contact information: [address] [place] [address] [phone]                 Wound care:   Incision 07/17/12 Perineum Left (Active)  Date First Assessed/Time First Assessed: 07/17/12 0957   Location: Perineum  Location Orientation: Left    Assessments 07/17/2012 10:25 AM 07/17/2012 11:39 AM  Dressing Type Gauze (Comment) Gauze (Comment)  Dressing New drainage New drainage  Closure -- Approximated  Drainage Amount Scant Scant     No Linked orders to display    Discharge Exam:   Vitals:   03/06/21 2324 03/07/21 0347 03/07/21 0900 03/07/21 1211  BP: (!) 124/58 131/68 (!) 150/69 (!) 153/88  Pulse: 65 63 66 60  Resp: 13 13 13 16   Temp: 97.8 F (36.6 C) 97.8 F (36.6 C) 97.6 F (36.4 C) 97.8 F (36.6 C)  TempSrc: Oral Oral Oral Oral  SpO2: 97% 98% 98% 94%  Weight:  125.3 kg    Height:        Body mass index is 32.97 kg/m.  General exam: Pleasant, not in distress Skin: No rashes, lesions or ulcers. HEENT: Atraumatic, normocephalic, no obvious bleeding Lungs: Clear to auscultation bilaterally CVS: Regular rate and rhythm, no murmur GI/Abd soft, nontender, nondistended, bowel sound present CNS: Alert, awake, oriented x3 Psychiatry: Mood appropriate Extremities: No pedal edema, no calf tenderness  Time coordinating discharge: 35 minutes   The results of significant diagnostics from this hospitalization (including imaging, microbiology, ancillary and laboratory) are listed below for reference.    Procedures and Diagnostic Studies:   DG Chest 2 View  Result Date: 03/05/2021 CLINICAL DATA:  syncope EXAM: CHEST - 2 VIEW COMPARISON:  September 02, 2019 FINDINGS: The cardiomediastinal silhouette is unchanged in contour. No pleural effusion. No pneumothorax. No  acute pleuroparenchymal abnormality. Visualized abdomen is unremarkable. Multilevel degenerative changes of the thoracic spine. IMPRESSION: No acute cardiopulmonary abnormality. Electronically Signed   By: Valentino Saxon M.D.   On: 03/05/2021 11:39   CT HEAD WO CONTRAST  Result Date: 03/05/2021 CLINICAL DATA:  Acute neurologic deficit, rule out stroke EXAM: CT HEAD WITHOUT CONTRAST TECHNIQUE: Contiguous axial images were obtained from the base of the skull through the vertex without intravenous contrast. COMPARISON:  04/04/2012 FINDINGS: Brain: Diffuse parenchymal atrophy. Patchy areas of hypoattenuation in deep and periventricular white matter bilaterally. Stable mild asymmetric parenchymal loss in the left occipital lobe. Stable small anterior falcine lipoma. Negative for acute intracranial hemorrhage, acute infarction, midline shift, or mass-effect. Acute infarct may be inapparent on noncontrast CT. Ventricles and sulci symmetric. Vascular: No hyperdense vessel or unexpected calcification. Skull: Normal. Negative for fracture  or focal lesion. Sinuses/Orbits: No acute finding. Other: None IMPRESSION: 1. Negative for bleed or other acute intracranial process. 2. Atrophy and nonspecific white matter changes. Electronically Signed   By: Lucrezia Europe M.D.   On: 03/05/2021 11:48     Labs:   Basic Metabolic Panel: Recent Labs  Lab 03/05/21 1101 03/05/21 1340 03/06/21 0120 03/07/21 0302  NA 140  --  140 139  K 3.1*  --  3.8 3.5  CL 105  --  108 105  CO2 27  --  26 26  GLUCOSE 149*  --  95 96  BUN 13  --  12 12  CREATININE 1.20  --  1.02 1.03  CALCIUM 8.6*  --  8.1* 8.4*  MG  --  1.7  --  1.8  PHOS  --   --   --  2.9   GFR Estimated Creatinine Clearance: 73.8 mL/min (by C-G formula based on SCr of 1.03 mg/dL). Liver Function Tests: Recent Labs  Lab 03/05/21 1101  AST 24  ALT 22  ALKPHOS 60  BILITOT 1.0  PROT 5.8*  ALBUMIN 3.2*   No results for input(s): LIPASE, AMYLASE in the  last 168 hours. No results for input(s): AMMONIA in the last 168 hours. Coagulation profile Recent Labs  Lab 03/05/21 1101  INR 1.0    CBC: Recent Labs  Lab 03/05/21 1101  WBC 11.0*  NEUTROABS 9.4*  HGB 13.2  HCT 40.1  MCV 92.2  PLT 189   Cardiac Enzymes: No results for input(s): CKTOTAL, CKMB, CKMBINDEX, TROPONINI in the last 168 hours. BNP: Invalid input(s): POCBNP CBG: Recent Labs  Lab 03/05/21 1045  GLUCAP 144*   D-Dimer No results for input(s): DDIMER in the last 72 hours. Hgb A1c Recent Labs    03/05/21 1430  HGBA1C 5.7*   Lipid Profile No results for input(s): CHOL, HDL, LDLCALC, TRIG, CHOLHDL, LDLDIRECT in the last 72 hours. Thyroid function studies Recent Labs    03/05/21 1339  TSH 1.052   Anemia work up No results for input(s): VITAMINB12, FOLATE, FERRITIN, TIBC, IRON, RETICCTPCT in the last 72 hours. Microbiology Recent Results (from the past 240 hour(s))  Resp Panel by RT-PCR (Flu A&B, Covid) Nasopharyngeal Swab     Status: None   Collection Time: 03/05/21 12:08 PM   Specimen: Nasopharyngeal Swab; Nasopharyngeal(NP) swabs in vial transport medium  Result Value Ref Range Status   SARS Coronavirus 2 by RT PCR NEGATIVE NEGATIVE Final    Comment: (NOTE) SARS-CoV-2 target nucleic acids are NOT DETECTED.  The SARS-CoV-2 RNA is generally detectable in upper respiratory specimens during the acute phase of infection. The lowest concentration of SARS-CoV-2 viral copies this assay can detect is 138 copies/mL. A negative result does not preclude SARS-Cov-2 infection and should not be used as the sole basis for treatment or other patient management decisions. A negative result may occur with  improper specimen collection/handling, submission of specimen other than nasopharyngeal swab, presence of viral mutation(s) within the areas targeted by this assay, and inadequate number of viral copies(<138 copies/mL). A negative result must be combined  with clinical observations, patient history, and epidemiological information. The expected result is Negative.  Fact Sheet for Patients:  EntrepreneurPulse.com.au  Fact Sheet for Healthcare Providers:  IncredibleEmployment.be  This test is no t yet approved or cleared by the [place] FDA and  has been authorized for detection and/or diagnosis of SARS-CoV-2 by FDA under an Emergency Use Authorization (EUA). This EUA will remain  in effect (  meaning this test can be used) for the duration of the COVID-19 declaration under Section 564(b)(1) of the Act, 21 U.S.C.section 360bbb-3(b)(1), unless the authorization is terminated  or revoked sooner.       Influenza A by PCR NEGATIVE NEGATIVE Final   Influenza B by PCR NEGATIVE NEGATIVE Final    Comment: (NOTE) The Xpert Xpress SARS-CoV-2/FLU/RSV plus assay is intended as an aid in the diagnosis of influenza from Nasopharyngeal swab specimens and should not be used as a sole basis for treatment. Nasal washings and aspirates are unacceptable for Xpert Xpress SARS-CoV-2/FLU/RSV testing.  Fact Sheet for Patients: EntrepreneurPulse.com.au  Fact Sheet for Healthcare Providers: IncredibleEmployment.be  This test is not yet approved or cleared by the [place] FDA and has been authorized for detection and/or diagnosis of SARS-CoV-2 by FDA under an Emergency Use Authorization (EUA). This EUA will remain in effect (meaning this test can be used) for the duration of the COVID-19 declaration under Section 564(b)(1) of the Act, 21 U.S.C. section 360bbb-3(b)(1), unless the authorization is terminated or revoked.  Performed at [facility], [place] [address] [place], [place] [address]      Signed: Terrilee Croak  [facility] 03/07/2021, 5:24 PM

## 2021-03-07 NOTE — Discharge Instructions (Signed)
Post procedure wound care instructions Keep incision clean and dry for 3 days. You can remove outer dressing tomorrow. Leave steri-strips (little pieces of tape) on until seen in the office for wound check appointment. Call the office ([phone]) for redness, drainage, swelling, or fever.  

## 2021-03-08 ENCOUNTER — Encounter (HOSPITAL_COMMUNITY): Payer: Self-pay | Admitting: Cardiology

## 2021-03-14 ENCOUNTER — Telehealth: Payer: Self-pay

## 2021-03-14 NOTE — Telephone Encounter (Signed)
Spoke with patient in regard to scheduling a hospital follow up. Patient has declined to do so, states that he is unable to drive per the hospital at discharge. Would like to wait to be seen and address this at his next appointment in December.

## 2021-03-15 ENCOUNTER — Encounter: Payer: PPO | Admitting: Internal Medicine

## 2021-03-17 ENCOUNTER — Other Ambulatory Visit: Payer: Self-pay

## 2021-03-17 ENCOUNTER — Ambulatory Visit (INDEPENDENT_AMBULATORY_CARE_PROVIDER_SITE_OTHER): Payer: PPO

## 2021-03-17 DIAGNOSIS — R55 Syncope and collapse: Secondary | ICD-10-CM

## 2021-03-17 LAB — CUP PACEART INCLINIC DEVICE CHECK
Date Time Interrogation Session: 20221103171904
Implantable Pulse Generator Implant Date: 20221024

## 2021-03-17 NOTE — Progress Notes (Signed)
ILR wound check in clinic. Steri strips removed. Wound well healed. Home monitor is not connected. Monitor serial number entered into [facility] and [place] made aware to check tomorrow for transmission and then schedule remote. 1 3 second pause nocturnal pause noted. Patient was asleep and confirmed no symptoms. Questions answered.

## 2021-03-18 ENCOUNTER — Telehealth: Payer: Self-pay

## 2021-03-18 NOTE — Telephone Encounter (Signed)
I spoke with the patient and let him know that his monitor did update. I let him know that his monitor is working properly. We straighten everything out. The patient thanked me for the call.

## 2021-03-18 NOTE — Telephone Encounter (Signed)
-----   Message from Simone Curia, RN sent at 03/17/2021  5:22 PM EDT ----- Please follow up to make sure monitor has connected and remotes are scheduled.

## 2021-03-21 DIAGNOSIS — C44629 Squamous cell carcinoma of skin of left upper limb, including shoulder: Secondary | ICD-10-CM | POA: Diagnosis not present

## 2021-03-21 DIAGNOSIS — Z85828 Personal history of other malignant neoplasm of skin: Secondary | ICD-10-CM | POA: Diagnosis not present

## 2021-03-21 DIAGNOSIS — L814 Other melanin hyperpigmentation: Secondary | ICD-10-CM | POA: Diagnosis not present

## 2021-03-21 DIAGNOSIS — C44319 Basal cell carcinoma of skin of other parts of face: Secondary | ICD-10-CM | POA: Diagnosis not present

## 2021-03-21 DIAGNOSIS — L570 Actinic keratosis: Secondary | ICD-10-CM | POA: Diagnosis not present

## 2021-03-21 DIAGNOSIS — L820 Inflamed seborrheic keratosis: Secondary | ICD-10-CM | POA: Diagnosis not present

## 2021-03-21 DIAGNOSIS — L821 Other seborrheic keratosis: Secondary | ICD-10-CM | POA: Diagnosis not present

## 2021-03-21 DIAGNOSIS — D1801 Hemangioma of skin and subcutaneous tissue: Secondary | ICD-10-CM | POA: Diagnosis not present

## 2021-04-19 ENCOUNTER — Ambulatory Visit (INDEPENDENT_AMBULATORY_CARE_PROVIDER_SITE_OTHER): Payer: PPO

## 2021-04-19 DIAGNOSIS — R55 Syncope and collapse: Secondary | ICD-10-CM | POA: Diagnosis not present

## 2021-04-19 NOTE — Progress Notes (Addendum)
CPE AND FOLLOW UP Assessment:   Encounter for Annual Physical Exam with abnormal findings Due annually  Health Maintenance reviewed Healthy lifestyle reviewed and goals set  Essential hypertension - continue medications, DASH diet, exercise and monitor at home. Call if greater than 130/80.   Chronic obstructive pulmonary disease, unspecified COPD type ([place]) Pulm follows, on low dose prednisone Remote smoker; normal CXR 02/2021 Has failed symticort, stiolto; given 1 week breztri sample to try - follow up with progress. Consider neb meds -   Rheumatoid arthritis involving multiple sites with positive rheumatoid factor ([place]) Continue follow up, low dose prednisone   Morbid Obesity - BMI 34 with COPD - follow up 3 months for progress monitoring - increase veggies, decrease carbs - long discussion about weight loss, diet, and exercise  Mixed hyperlipidemia -continue medications, check lipids, decrease fatty foods, increase activity.   Medication management CBC, CMP/GFR, magnesium, UA  Vitamin D deficiency Continue supplement  Benign prostatic hyperplasia with lower urinary tract symptoms, symptom details unspecified Monitor symptoms; controlled on finasteride and hytrin  Irritable bowel syndrome, unspecified type Encourage soluble fiber; monitor  Gastroesophageal reflux disease, esophagitis presence not specified Well managed by lifestyle Discussed diet, avoiding triggers and other lifestyle changes  Uncomplicated asthma, unspecified asthma severity, unspecified whether persistent Has seen pulm, denies wheezing on low dose prednisone  Syncope Denies recurrence, loop recorder in place, follow up pending  Dyspnea on minimal exertion Per patient chronic/baseline and unchanged Sedentary - likely decompensation contributing Discuss with upcoming cardiology Will try breztri for COPD, consider neb meds Follow up with progress ED if any acute worsening  URI Most likely  viral; encouraged supportive care Family recently had similar He declined covid test today Try breztri samples, albuterol sent in  [place] recommended, sent in tessalon  IF increased coughing with productivity start zpak  Orders Placed This Encounter  Procedures   CBC with Differential/Platelet   COMPLETE METABOLIC PANEL WITH GFR   Magnesium   Lipid panel   TSH   VITAMIN D 25 Hydroxy (Vit-D Deficiency, Fractures)   PSA   Microalbumin / creatinine urine ratio   Urinalysis, Routine w reflex microscopic   Vitamin B12    Over 30 minutes of exam, counseling, chart review, and critical decision making was performed  Future Appointments  Date Time Provider [place]  04/21/2021 11:00 AM Shirley Friar, PA-C [facility]  05/23/2021  7:00 AM [facility] DEVICE REMOTES [facility]  06/27/2021  7:00 AM [facility] DEVICE REMOTES [facility]  08/01/2021  7:00 AM [facility] DEVICE REMOTES [facility]  09/05/2021  7:00 AM [facility] DEVICE REMOTES [facility]  10/11/2021  7:05 AM [facility] DEVICE REMOTES [facility]  10/25/2021  2:30 PM Liane Comber, NP [facility] None  11/14/2021  7:00 AM [facility] DEVICE REMOTES [facility]  12/19/2021  7:00 AM [facility] DEVICE REMOTES [facility]  05/02/2022  3:00 PM Unk Pinto, MD [facility] None     Plan:   During the course of the visit the patient was educated and counseled about appropriate screening and preventive services including:   Pneumococcal vaccine  Influenza vaccine Prevnar 13 Td vaccine Screening electrocardiogram Colorectal cancer screening Diabetes screening Glaucoma screening Nutrition counseling    Subjective:  [name] is a 87 y.o. male who presents for CPE and follow up. He has Rheumatoid arthritis ([place]); GERD (gastroesophageal reflux disease); DJD (degenerative joint disease); IBS  (irritable bowel syndrome); COPD (chronic obstructive pulmonary disease) ([place]); Benign prostatic hyperplasia; Asthma; Essential hypertension; Mixed hyperlipidemia; Other abnormal  glucose; Vitamin D deficiency; Medication management; Morbid obesity ([place]); Aortic atherosclerosis ([place]) by CT scan 2011; Syncope; Bradycardia; and History of skin cancer on their problem list.  He is married, 2 children, daughter Claiborne Billings is with him today, 5 grand kids, 8 great grands. He is a retired Administrator. His wife is on hospice.   He reports 2 days of "cold" - mild sore throat, hoarseness, mild chills last night but resolved, overall improved today. He does report mild cough, non-productive.   He was recently admitted 03/05/2021 for syncopal episode, with bradycardia and pauses on telemetry. He had loop recorder implanted by Dr. Lars Mage and is being followed remotely, appears has had 1 episode of 3 sec pause nocturnally while patient was asleep, has follow up planned with cardiology later this week, family strongly preferring pacemaker.   He is a former smoker, 30 pack years quit in 1982; he has COPD and asthma, has seen Dr. Londell Moh, on prednisone 5 mg daily for many years. He tried symbicort and stiolto with apprent lack of perceived benefit. Some suspicion of decompensation. He reports ongoing exertional dyspnea.   He has seen Dr Gavin Pound for Rheumatoid Arthritis (2010) in the past, recently treatment by daily low dose steroid.   GERD and IBS are recently well controlled by diet.   Hx of SCC/BCC, follows with derm q50m.   BMI is Body mass index is 34.06 kg/m., he is working on diet and exercise. Wt Readings from Last 3 Encounters:  04/20/21 279 lb 12.8 oz (126.9 kg)  03/07/21 276 lb 3.8 oz (125.3 kg)  09/28/20 270 lb 12.8 oz (122.8 kg)   His blood pressure has been controlled at home, today their BP is BP: 112/60 He does not workout. He denies chest pain, shortness of breath, dizziness.    He has aortic atherosclerosis per CT in 2011.   He is on cholesterol medication and denies myalgias. His cholesterol is at goal. The cholesterol last visit was:   Lab Results  Component Value Date   CHOL 109 09/28/2020   HDL 36 (L) 09/28/2020   LDLCALC 49 09/28/2020   TRIG 165 (H) 09/28/2020   CHOLHDL 3.0 09/28/2020   He has not been working on diet/exercise for prediabetes. He denies diabetic polys . Last A1C in the office was:  Lab Results  Component Value Date   HGBA1C 5.7 (H) 03/05/2021   Last GFR Lab Results  Component Value Date   GFRNONAA >60 03/07/2021   Patient is on Vitamin D supplement, taking 5000 IU daily  Lab Results  Component Value Date   VD25OH 54 09/28/2020     He has BPH LUTs resolved, hx of TURP, he is on finasteride 5 mg and hytrin daily. Shared decision making today, wants to continue annual PSA.  Lab Results  Component Value Date   PSA 0.27 03/09/2020   PSA 0.3 02/13/2019   PSA 0.5 07/09/2017       Medication Review: Current Outpatient Medications on File Prior to Visit  Medication Sig Dispense Refill   Cholecalciferol (VITAMIN D) 125 MCG (5000 UT) CAPS Take 5,000 Units by mouth daily.     finasteride (PROSCAR) 5 MG tablet Take  1 tablet  Daily  for Prostate (Patient taking differently: Take 5 mg by mouth daily.) 90 tablet 3   olmesartan (BENICAR) 20 MG tablet Take 1 tablet  Daily  for BP (Patient taking differently: Take 20 mg by mouth daily.) 90 tablet 3   predniSONE (DELTASONE) 5  MG tablet Take 1 tablet 2 x / day or as directed for Asthma & Rheumatoid Arthritis (Patient taking differently: Take 10 mg by mouth daily.) 180 tablet 1   rosuvastatin (CRESTOR) 20 MG tablet Take  1 tablet  Daily  for Cholesterol (Patient taking differently: Take 20 mg by mouth daily.) 90 tablet 3   terazosin (HYTRIN) 10 MG capsule Take  1 capsule  at Bedtime  for Prostate (Patient taking differently: Take 10 mg by mouth at bedtime.) 90 capsule 3   No current  facility-administered medications on file prior to visit.    Allergies: Allergies  Allergen Reactions   Ace Inhibitors    Asa [Aspirin]     High Dose asprin   Penicillins Hives   Vasotec [Enalapril]     Cough   Clindamycin/Lincomycin     Hives     Current Problems (verified) has Rheumatoid arthritis (HCC); GERD (gastroesophageal reflux disease); DJD (degenerative joint disease); IBS (irritable bowel syndrome); COPD (chronic obstructive pulmonary disease) ([place]); Benign prostatic hyperplasia; Asthma; Essential hypertension; Mixed hyperlipidemia; Other abnormal glucose; Vitamin D deficiency; Medication management; Morbid obesity ([place]); Aortic atherosclerosis ([place]) by CT scan 2011; Syncope; Bradycardia; and History of skin cancer on their problem list.  Screening Tests Immunization History  Administered Date(s) Administered   DT (Pediatric) 04/15/2014   Influenza, High Dose Seasonal PF 04/15/2014, 02/16/2015, 02/18/2016, 02/04/2018, 03/09/2020, 03/11/2021   Influenza-Unspecified 03/10/2013, 02/07/2017, 02/13/2019   PFIZER(Purple Top)SARS-COV-2 Vaccination 05/27/2019, 06/15/2019, 03/17/2020   Pneumococcal Conjugate-13 04/15/2014   Pneumococcal Polysaccharide-23 05/15/2001, 03/30/2011   Td 05/15/2002   Zoster, Live 04/01/2012    Preventative care: Last colonoscopy: unknown, declines another  CXR 03/05/2021  Prior vaccinations: TD or Tdap: 2015  Influenza: 02/2021 Pneumococcal: 2012 Prevnar13: 2015 Shingles/Zostavax: 2013 Covid 19: 2/2 pfizer, last 03/17/2020, booster encouraged   Names of Other Physician/Practitioners you currently use: 1. [facility] here for primary care 2. Dr. Katy Fitch, eye doctor, last visit 2018, wears glasses 3. Dr. Rip Harbour, dentist, last visit 2022, goes q45m 4. Dr. Sarajane Jews, derm, last visit 2022, goes q9m, hx SCC/BCC  Patient Care Team: Unk Pinto, MD as PCP - General (Internal Medicine) Vickie Epley, MD as PCP - Electrophysiology (Cardiology)  Surgical: He  has a past surgical history that includes Transurethral resection of prostate (SEVERAL YRS AGO); transthoracic echocardiogram (04-26-2011  ); Cardiovascular stress test (04-26-2011  DR BRACKBILL); Lumbar fusion (X3  LAST ONE 1996); Scrotal exploration (Left, 07/17/2012); and LOOP RECORDER INSERTION (N/A, 03/07/2021). Family His family history includes Colon cancer (age of onset: 31) in his brother; Heart disease in his brother and father. Social history  He reports that he quit smoking about 40 years ago. His smoking use included cigarettes. He has a 30.00 pack-year smoking history. His smokeless tobacco use includes chew. He reports that he does not drink alcohol and does not use drugs.   Review of Systems  Constitutional:  Negative for malaise/fatigue and weight loss.  HENT:  Positive for congestion. Negative for hearing loss, sore throat and tinnitus.   Eyes:  Negative for blurred vision and double vision.  Respiratory:  Positive for cough (dry) and shortness of breath (chronic, exertional, unchanged). Negative for sputum production and wheezing.   Cardiovascular:  Negative for chest pain, palpitations, orthopnea, claudication and leg swelling.  Gastrointestinal:  Negative for abdominal pain, blood in stool, constipation, diarrhea, heartburn, melena, nausea and vomiting.  Genitourinary: Negative.   Musculoskeletal:  Positive for back pain (chronic) and falls (syncope event, denies  since). Negative for joint pain and myalgias.  Skin:  Negative for rash.  Neurological:  Negative for dizziness, tingling, sensory change, focal weakness, weakness and headaches.  Endo/Heme/Allergies:  Negative for environmental allergies and polydipsia.  Psychiatric/Behavioral: Negative.  Negative for depression, memory loss and substance abuse. The patient is not nervous/anxious.   All other systems reviewed and are negative.   Objective:   Today's  Vitals   04/20/21 1514  BP: 112/60  Pulse: 89  Temp: 97.7 F (36.5 C)  SpO2: 97%  Weight: 279 lb 12.8 oz (126.9 kg)  Height: 6\' 4"  (1.93 m)    Body mass index is 34.06 kg/m.  General appearance: alert, no distress, WD/WN, male HEENT: normocephalic, sclerae anicteric, TMs pearly, nares patent, no discharge or erythema, pharynx normal Oral cavity: MMM, no lesions Neck: supple, no lymphadenopathy, no thyromegaly, no masses Heart: RRR, normal S1, S2, no murmurs Lungs: + diffuse wheezes without rhonchi or rales Abdomen: +bs, soft, obese, non tender, non distended, no masses, no hepatomegaly, no splenomegaly Musculoskeletal: nontender, no swelling, no obvious deformity Extremities: no edema, no cyanosis, no clubbing Pulses: 2+ symmetric, upper and lower extremities, normal cap refill Neurological: alert, oriented x 3, CN2-12 intact, strength normal upper extremities and lower extremities, sensation normal throughout, DTRs 2+ throughout, no cerebellar signs, gait normal Psychiatric: normal affect, behavior normal, pleasant  Skin: warm, dry; scattered actinic keratoses; extensive nevi to back; derm follows  Medicare Attestation I have personally reviewed: The patient's medical and social history Their use of alcohol, tobacco or illicit drugs Their current medications and supplements The patient's functional ability including ADLs,fall risks, home safety risks, cognitive, and hearing and visual impairment Diet and physical activities Evidence for depression or mood disorders  The patient's weight, height, BMI, and visual acuity have been recorded in the chart.  I have made referrals, counseling, and provided education to the patient based on review of the above and I have provided the patient with a written personalized care plan for preventive services.     Izora Ribas, NP   04/20/2021

## 2021-04-20 ENCOUNTER — Other Ambulatory Visit: Payer: Self-pay

## 2021-04-20 ENCOUNTER — Ambulatory Visit (INDEPENDENT_AMBULATORY_CARE_PROVIDER_SITE_OTHER): Payer: PPO | Admitting: Adult Health

## 2021-04-20 ENCOUNTER — Encounter: Payer: Self-pay | Admitting: Adult Health

## 2021-04-20 VITALS — BP 112/60 | HR 89 | Temp 97.7°F | Ht 76.0 in | Wt 279.8 lb

## 2021-04-20 DIAGNOSIS — I10 Essential (primary) hypertension: Secondary | ICD-10-CM | POA: Diagnosis not present

## 2021-04-20 DIAGNOSIS — Z85828 Personal history of other malignant neoplasm of skin: Secondary | ICD-10-CM

## 2021-04-20 DIAGNOSIS — E559 Vitamin D deficiency, unspecified: Secondary | ICD-10-CM

## 2021-04-20 DIAGNOSIS — J45909 Unspecified asthma, uncomplicated: Secondary | ICD-10-CM

## 2021-04-20 DIAGNOSIS — Z0001 Encounter for general adult medical examination with abnormal findings: Secondary | ICD-10-CM | POA: Diagnosis not present

## 2021-04-20 DIAGNOSIS — Z0000 Encounter for general adult medical examination without abnormal findings: Secondary | ICD-10-CM | POA: Diagnosis not present

## 2021-04-20 DIAGNOSIS — R7309 Other abnormal glucose: Secondary | ICD-10-CM

## 2021-04-20 DIAGNOSIS — Z1329 Encounter for screening for other suspected endocrine disorder: Secondary | ICD-10-CM

## 2021-04-20 DIAGNOSIS — I700 Atherosclerosis of aorta: Secondary | ICD-10-CM

## 2021-04-20 DIAGNOSIS — N401 Enlarged prostate with lower urinary tract symptoms: Secondary | ICD-10-CM | POA: Diagnosis not present

## 2021-04-20 DIAGNOSIS — M0579 Rheumatoid arthritis with rheumatoid factor of multiple sites without organ or systems involvement: Secondary | ICD-10-CM

## 2021-04-20 DIAGNOSIS — Z79899 Other long term (current) drug therapy: Secondary | ICD-10-CM

## 2021-04-20 DIAGNOSIS — E782 Mixed hyperlipidemia: Secondary | ICD-10-CM

## 2021-04-20 DIAGNOSIS — E538 Deficiency of other specified B group vitamins: Secondary | ICD-10-CM

## 2021-04-20 DIAGNOSIS — Z125 Encounter for screening for malignant neoplasm of prostate: Secondary | ICD-10-CM | POA: Diagnosis not present

## 2021-04-20 DIAGNOSIS — K219 Gastro-esophageal reflux disease without esophagitis: Secondary | ICD-10-CM | POA: Diagnosis not present

## 2021-04-20 DIAGNOSIS — Z1389 Encounter for screening for other disorder: Secondary | ICD-10-CM

## 2021-04-20 DIAGNOSIS — R0609 Other forms of dyspnea: Secondary | ICD-10-CM

## 2021-04-20 DIAGNOSIS — J449 Chronic obstructive pulmonary disease, unspecified: Secondary | ICD-10-CM

## 2021-04-20 LAB — CUP PACEART REMOTE DEVICE CHECK
Date Time Interrogation Session: 20221206175315
Implantable Pulse Generator Implant Date: 20221024

## 2021-04-20 MED ORDER — BREZTRI AEROSPHERE 160-9-4.8 MCG/ACT IN AERO: 2.0000 | INHALATION_SPRAY | Freq: Two times a day (BID) | RESPIRATORY_TRACT | 0 refills | Status: DC

## 2021-04-20 MED ORDER — AZITHROMYCIN 250 MG PO TABS: ORAL_TABLET | ORAL | 1 refills | Status: AC

## 2021-04-20 MED ORDER — ALBUTEROL SULFATE HFA 108 (90 BASE) MCG/ACT IN AERS: 1.0000 | INHALATION_SPRAY | RESPIRATORY_TRACT | 0 refills | Status: DC | PRN

## 2021-04-20 MED ORDER — BENZONATATE 100 MG PO CAPS: 200.0000 mg | ORAL_CAPSULE | Freq: Three times a day (TID) | ORAL | 0 refills | Status: DC | PRN

## 2021-04-20 NOTE — Progress Notes (Signed)
Electrophysiology Office Note Date: 04/21/2021  ID:  [name], DOB 14-Sep-1930, MRN [id number]  PCP: Unk Pinto, MD Primary Cardiologist: None Electrophysiologist: Vickie Epley, MD   CC: ILR follow-up  [name] is a 87 y.o. male seen today for  Dr. Quentin Ore  . he presents today for routine electrophysiology followup after recent admission. Since last being seen in our clinic, the patient reports doing well overall. He has not had any significant episodes on his monitor. (Nocturnal short pauses and bradycardia only). he denies chest pain, palpitations, dyspnea, PND, orthopnea, nausea, vomiting, dizziness, syncope, edema, weight gain, or early satiety.  Device History: Medtronic loop recorder implanted 02/2021 for syncope  Past Medical History:  Diagnosis Date   Allergy    Asthma    BPH (benign prostatic hypertrophy)    BPH (benign prostatic hypertrophy)    COPD (chronic obstructive pulmonary disease) (HCC)    DJD (degenerative joint disease)    GERD (gastroesophageal reflux disease)    History of urinary retention    Hypertension    IBS (irritable bowel syndrome)    RBBB    Rheumatoid arthritis(714.0)    DR. HAWKES   Scrotal lesion    Short of breath on exertion    Past Surgical History:  Procedure Laterality Date   CARDIOVASCULAR STRESS TEST  04-26-2011  DR BRACKBILL   LOW RISK STRESS NUCLEAR STUDY/ NO EVIDENCE ISCHEMIA   LOOP RECORDER INSERTION N/A 03/07/2021   Procedure: LOOP RECORDER INSERTION;  Surgeon: Vickie Epley, MD;  Location: [facility];  Service: Cardiovascular;  Laterality: N/A;   LUMBAR FUSION  X3  LAST ONE 1996   SCROTAL EXPLORATION Left 07/17/2012   Procedure:  EXCISION  OF LEFT SCROTAL SKIN LESION;  Surgeon: Molli Hazard, MD;  Location: [facility];  Service: Urology;  Laterality: Left;   TRANSTHORACIC ECHOCARDIOGRAM  04-26-2011     MILD LVH/ LVSF NORMAL/ EF 59-16%/ GRADE I DIASTOLIC DYSFUNCTION/  MILD MITRAL REGURG.   TRANSURETHRAL RESECTION OF PROSTATE  SEVERAL YRS AGO    Current Outpatient Medications  Medication Sig Dispense Refill   albuterol (VENTOLIN HFA) 108 (90 Base) MCG/ACT inhaler Inhale 1-2 puffs into the lungs every 4 (four) hours as needed for wheezing or shortness of breath. 1 each 0   Cholecalciferol (VITAMIN D) 125 MCG (5000 UT) CAPS Take 5,000 Units by mouth daily.     finasteride (PROSCAR) 5 MG tablet Take  1 tablet  Daily  for Prostate (Patient taking differently: Take 5 mg by mouth daily.) 90 tablet 3   olmesartan (BENICAR) 20 MG tablet Take 1 tablet  Daily  for BP (Patient taking differently: Take 20 mg by mouth daily.) 90 tablet 3   predniSONE (DELTASONE) 5 MG tablet Take 1 tablet 2 x / day or as directed for Asthma & Rheumatoid Arthritis (Patient taking differently: Take 10 mg by mouth daily.) 180 tablet 1   rosuvastatin (CRESTOR) 20 MG tablet Take  1 tablet  Daily  for Cholesterol (Patient taking differently: Take 20 mg by mouth daily.) 90 tablet 3   terazosin (HYTRIN) 10 MG capsule Take  1 capsule  at Bedtime  for Prostate (Patient taking differently: Take 10 mg by mouth at bedtime.) 90 capsule 3   azithromycin (ZITHROMAX) 250 MG tablet Take 2 tablets (500 mg) on  Day 1,  followed by 1 tablet (250 mg) once daily on Days 2 through 5. (Patient not taking: Reported on 04/21/2021) 6 each 1  benzonatate (TESSALON PERLES) 100 MG capsule Take 2 capsules (200 mg total) by mouth 3 (three) times daily as needed for cough (Max: 600mg  per day). (Patient not taking: Reported on 04/21/2021) 60 capsule 0   Budeson-Glycopyrrol-Formoterol (BREZTRI AEROSPHERE) 160-9-4.8 MCG/ACT AERO Inhale 2 puffs into the lungs in the morning and at bedtime. Rinse mouth and gargle after each use. (Patient not taking: Reported on 04/21/2021) 5.9 g 0   No current facility-administered medications for this visit.    Allergies:   Ace inhibitors, Asa [aspirin], Penicillins, Vasotec [enalapril], and  Clindamycin/lincomycin   Social History: Social History   Socioeconomic History   Marital status: Married    Spouse name: Not on file   Number of children: Not on file   Years of education: Not on file   Highest education level: Not on file  Occupational History   Occupation: RETIRED    Employer: RETIRED  Tobacco Use   Smoking status: Former    Packs/day: 1.00    Years: 30.00    Pack years: 30.00    Types: Cigarettes    Quit date: 07/11/1980    Years since quitting: 40.8   Smokeless tobacco: Current    Types: Chew  Vaping Use   Vaping Use: Never used  Substance and Sexual Activity   Alcohol use: No   Drug use: No   Sexual activity: Not on file  Other Topics Concern   Not on file  Social History Narrative   Not on file   Social Determinants of Health   Financial Resource Strain: Not on file  Food Insecurity: Not on file  Transportation Needs: Not on file  Physical Activity: Not on file  Stress: Not on file  Social Connections: Not on file  Intimate Partner Violence: Not on file    Family History: Family History  Problem Relation Age of Onset   Heart disease Father    Heart disease Brother    Colon cancer Brother 42     Review of Systems: All other systems reviewed and are otherwise negative except as noted above.  Physical Exam: Vitals:   04/21/21 1116  BP: 130/70  Pulse: 91  SpO2: 96%  Weight: 278 lb 12.8 oz (126.5 kg)  Height: 6\' 5"  (1.956 m)     GEN- The patient is well appearing, alert and oriented x 3 today.   HEENT: normocephalic, atraumatic; sclera clear, conjunctiva pink; hearing intact; oropharynx clear; neck supple  Lungs- Clear to ausculation bilaterally, normal work of breathing.  No wheezes, rales, rhonchi Heart- Regular rate and rhythm, no murmurs, rubs or gallops  GI- soft, non-tender, non-distended, bowel sounds present  Extremities- no clubbing, cyanosis, or edema  MS- no significant deformity or atrophy Skin- warm and dry, no  rash or lesion; PPM pocket well healed Psych- euthymic mood, full affect Neuro- strength and sensation are intact  PPM Interrogation- reviewed in detail today,  See PACEART report  EKG:  EKG is not ordered today.  Recent Labs: 04/20/2021: ALT 13; BUN 21; Creat 1.14; Hemoglobin 13.1; Magnesium 1.8; Platelets 226; Potassium 3.8; Sodium 142; TSH 1.03   Wt Readings from Last 3 Encounters:  04/21/21 278 lb 12.8 oz (126.5 kg)  04/20/21 279 lb 12.8 oz (126.9 kg)  03/07/21 276 lb 3.8 oz (125.3 kg)     Other studies Reviewed: Additional studies/ records that were reviewed today include: Previous EP office notes, Previous remote checks, Most recent labwork.   Assessment and Plan:  1. Syncope s/p Medtronic Loop recorder Normal  device function See Claudia Desanctis Art report No changes today Thus far, no symptoms and only nocturnal bradycardia and 2 nocturnal pauses/non-conducted p wave, personally reviewed.   Current medicines are reviewed at length with the patient today.     Disposition:   Follow up with  Dr. Quentin Ore  in 3 Months    Signed, Annamaria Helling  04/21/2021 11:24 AM  [facility] [address] [place] [address] [phone] (office) [phone] (fax)

## 2021-04-20 NOTE — Patient Instructions (Signed)
Budesonide; Glycopyrrolate; Formoterol inhalation aerosol What is this medication? BUDESONIDE; GLYCOPYRROLATE; FORMOTEROL (byoo DES oh nide; glye koe PYE roe late; for [facility] te rol) inhalation is a combination of 3 medicines. Budesonide decreases inflammation in the lungs. Formoterol and glycopyrrolate are bronchodilators that help keep airways open. This medicine is used to treat chronic obstructive pulmonary disease (COPD). Do not use this drug combination for acute COPD attacks or bronchospasm. This medicine may be used for other purposes; ask your health care provider or pharmacist if you have questions. COMMON BRAND NAME(S): BREZTRI What should I tell my care team before I take this medication? They need to know if you have any of these conditions: bladder problems or trouble passing urine bone problems diabetes eye disease, vision problems heart disease high blood pressure history of irregular heartbeat immune system problems infection kidney disease pheochromocytoma prostate disease seizures thyroid disease an unusual or allergic reaction to budesonide, formoterol, glycopyrrolate, medicines, foods, dyes, or preservatives pregnant or trying to get pregnant breast-feeding How should I use this medication? This medicine is inhaled through the mouth. Follow the directions on the prescription label. Shake well before each use. Rinse your mouth with water after use. Make sure not to swallow the water. Take your medicine at regular intervals. Do not take your medicine more often than directed. Do not stop taking except on your doctor's advice. Make sure that you are using your inhaler correctly. This drug comes with INSTRUCTIONS FOR USE. Ask your pharmacist for directions on how to use this drug. Read the information carefully. Talk to your pharmacist or health care provider if you have questions. Talk to your pediatrician regarding the use of this medicine in children. This medicine is not  approved for use in children. Overdosage: If you think you have taken too much of this medicine contact a poison control center or emergency room at once. NOTE: This medicine is only for you. Do not share this medicine with others. What if I miss a dose? If you miss a dose, use it as soon as you can. If it is almost time for your next dose, use only that dose. Do not use double or extra doses. What may interact with this medication? Do not take the medicine with any of the following medications: cisapride dofetilide dronedarone MAOIs like Marplan, Nardil, and Parnate other medicines that contain long-acting beta-2 agonists (LABAs) like arformoterol, formoterol, indacaterol, olodaterol, salmeterol, vilanterol pimozide procarbazine thioridazine This medicine may also interact with the following medications: antihistamines for allergy, cough, and cold atropine certain antibiotics like clarithromycin, telithromycin certain antivirals for HIV or hepatitis certain heart medicines like atenolol, metoprolol certain medicines for bladder problems like oxybutynin, tolterodine certain medicines for blood pressure, heart disease, irregular heartbeat certain medicines for depression, anxiety, or psychotic disturbances certain medicines for fungal infections like ketoconazole, itraconazole certain medicines for Parkinson's disease like benztropine, trihexyphenidyl certain medicines for stomach problems like dicyclomine, hyoscyamine certain medicines for travel sickness like scopolamine diuretics grapefruit juice mifepristone other inhaled medicines that contain anticholinergics such as aclidinium, ipratropium, tiotropium, umeclidinium other medicines that prolong the QT interval (an abnormal heart rhythm) some vaccines steroid medicines like prednisone or cortisone stimulant medicines for attention disorders, weight loss, or to stay awake theophylline This list may not describe all possible  interactions. Give your health care provider a list of all the medicines, herbs, non-prescription drugs, or dietary supplements you use. Also tell them if you smoke, drink alcohol, or use illegal drugs. Some items may interact with your  medicine. What should I watch for while using this medication? Visit your health care professional for regular checks on your progress. Tell your health care professional if your symptoms do not start to get better or if they get worse. NEVER use this medicine for an acute asthma or COPD attack. You should use your short-acting rescue inhalers for this purpose. If your symptoms get worse or if you need your short-acting inhalers more often, call your doctor right away. This medicine may increase your risk of getting an infection. Tell your doctor or health care professional if you are around anyone with measles or chickenpox, or if you develop sores or blisters that do not heal properly. Do not get this medicine in your eyes. It can cause irritation, pain, or blurred vision. What side effects may I notice from receiving this medication? Side effects that you should report to your doctor or health care professional as soon as possible: allergic reactions like skin rash, itching or hives, swelling of the face, lips, or tongue anxious breathing problems changes in vision, eye pain muscle cramps or muscle pain signs and symptoms of a dangerous change in heartbeat or heart rhythm like chest pain; dizziness; fast or irregular heartbeat; palpitations; feeling faint or lightheaded, falls; breathing problems signs and symptoms of high blood sugar such as being more thirsty or hungry or having to urinate more than normal. You may also feel very tired or have blurry vision signs and symptoms of infection like fever; chills; cough; sore throat; pain or trouble passing urine tremors trouble passing urine or change in the amount of urine unusually weak or tired white patches in  the mouth or mouth sores Side effects that usually do not require medical attention (report these to your doctor or health care professional if they continue or are bothersome): back pain changes in taste cough diarrhea runny or stuffy nose upset stomach This list may not describe all possible side effects. Call your doctor for medical advice about side effects. You may report side effects to FDA at 1-800-FDA-1088. Where should I keep my medication? Keep out of the reach of children. Store in a dry place at room temperature between 15 and 30 degrees C (59 and 86 degrees F). Protect from heat. Do not freeze. Do not use or store near heat or flame, as the canister may burst. Throw away the inhaler 3 months after you open the foil pouch (for the 120-inhalation canister), or 3 weeks after you open the foil pouch (for the 28-inhalation canister), or when the dose indicator reaches zero "0", whichever comes first. NOTE: This sheet is a summary. It may not cover all possible information. If you have questions about this medicine, talk to your doctor, pharmacist, or health care provider.  2022 Elsevier/Gold Standard (2018-12-16 00:00:00)

## 2021-04-21 ENCOUNTER — Encounter: Payer: PPO | Admitting: Cardiology

## 2021-04-21 ENCOUNTER — Other Ambulatory Visit: Payer: Self-pay | Admitting: Adult Health

## 2021-04-21 ENCOUNTER — Ambulatory Visit
Admission: RE | Admit: 2021-04-21 | Discharge: 2021-04-21 | Disposition: A | Discharge status: Home / Self Care | Payer: PPO | Source: Ambulatory Visit | Attending: Adult Health | Admitting: Adult Health

## 2021-04-21 ENCOUNTER — Ambulatory Visit (INDEPENDENT_AMBULATORY_CARE_PROVIDER_SITE_OTHER): Payer: PPO | Admitting: Student

## 2021-04-21 ENCOUNTER — Encounter: Payer: Self-pay | Admitting: Student

## 2021-04-21 VITALS — BP 130/70 | HR 91 | Ht 77.0 in | Wt 278.8 lb

## 2021-04-21 DIAGNOSIS — R8290 Unspecified abnormal findings in urine: Secondary | ICD-10-CM | POA: Diagnosis not present

## 2021-04-21 DIAGNOSIS — R051 Acute cough: Secondary | ICD-10-CM

## 2021-04-21 DIAGNOSIS — R55 Syncope and collapse: Secondary | ICD-10-CM

## 2021-04-21 DIAGNOSIS — R059 Cough, unspecified: Secondary | ICD-10-CM | POA: Diagnosis not present

## 2021-04-21 DIAGNOSIS — E538 Deficiency of other specified B group vitamins: Secondary | ICD-10-CM | POA: Insufficient documentation

## 2021-04-21 DIAGNOSIS — D729 Disorder of white blood cells, unspecified: Secondary | ICD-10-CM

## 2021-04-21 NOTE — Patient Instructions (Signed)
Medication Instructions:  Your physician recommends that you continue on your current medications as directed. Please refer to the Current Medication list given to you today.  *If you need a refill on your cardiac medications before your next appointment, please call your pharmacy*   Lab Work: None If you have labs (blood work) drawn today and your tests are completely normal, you will receive your results only by: [place] (if you have MyChart) OR A paper copy in the mail If you have any lab test that is abnormal or we need to change your treatment, we will call you to review the results.   Follow-Up: At [facility], you and your health needs are our priority.  As part of our continuing mission to provide you with exceptional heart care, we have created designated Provider Care Teams.  These Care Teams include your primary Cardiologist (physician) and Advanced Practice Providers (APPs -  Physician Assistants and Nurse Practitioners) who all work together to provide you with the care you need, when you need it.  We recommend signing up for the patient portal called "MyChart".  Sign up information is provided on this After Visit Summary.  MyChart is used to connect with patients for Virtual Visits (Telemedicine).  Patients are able to view lab/test results, encounter notes, upcoming appointments, etc.  Non-urgent messages can be sent to your provider as well.   To learn more about what you can do with MyChart, go to [email or web address].    Your next appointment:   3 month(s)  The format for your next appointment:   In Person  Provider:   You may see Vickie Epley, MD or one of the following Advanced Practice Providers on your designated Care Team:   Tommye Standard, [place] "[facility]" [place], [place]

## 2021-04-22 LAB — URINE CULTURE
MICRO NUMBER:: 12733599
SPECIMEN QUALITY:: ADEQUATE

## 2021-04-23 LAB — CBC WITH DIFFERENTIAL/PLATELET
Absolute Monocytes: 1152 cells/uL — ABNORMAL HIGH (ref 200–950)
Basophils Absolute: 14 cells/uL (ref 0–200)
Basophils Relative: 0.1 %
Eosinophils Absolute: 0 cells/uL — ABNORMAL LOW (ref 15–500)
Eosinophils Relative: 0 %
HCT: 38.9 % (ref 38.5–50.0)
Hemoglobin: 13.1 g/dL — ABNORMAL LOW (ref 13.2–17.1)
Lymphs Abs: 2045 cells/uL (ref 850–3900)
MCH: 31 pg (ref 27.0–33.0)
MCHC: 33.7 g/dL (ref 32.0–36.0)
MCV: 92.2 fL (ref 80.0–100.0)
MPV: 10 fL (ref 7.5–12.5)
Monocytes Relative: 8 %
Neutro Abs: 11189 cells/uL — ABNORMAL HIGH (ref 1500–7800)
Neutrophils Relative %: 77.7 %
Platelets: 226 10*3/uL (ref 140–400)
RBC: 4.22 10*6/uL (ref 4.20–5.80)
RDW: 12.8 % (ref 11.0–15.0)
Total Lymphocyte: 14.2 %
WBC: 14.4 10*3/uL — ABNORMAL HIGH (ref 3.8–10.8)

## 2021-04-23 LAB — MAGNESIUM: Magnesium: 1.8 mg/dL (ref 1.5–2.5)

## 2021-04-23 LAB — COMPLETE METABOLIC PANEL WITH GFR
AG Ratio: 1.4 (calc) (ref 1.0–2.5)
ALT: 13 U/L (ref 9–46)
AST: 12 U/L (ref 10–35)
Albumin: 3.4 g/dL — ABNORMAL LOW (ref 3.6–5.1)
Alkaline phosphatase (APISO): 64 U/L (ref 35–144)
BUN: 21 mg/dL (ref 7–25)
CO2: 29 mmol/L (ref 20–32)
Calcium: 8.6 mg/dL (ref 8.6–10.3)
Chloride: 105 mmol/L (ref 98–110)
Creat: 1.14 mg/dL (ref 0.70–1.22)
Globulin: 2.4 g/dL (calc) (ref 1.9–3.7)
Glucose, Bld: 97 mg/dL (ref 65–99)
Potassium: 3.8 mmol/L (ref 3.5–5.3)
Sodium: 142 mmol/L (ref 135–146)
Total Bilirubin: 1.2 mg/dL (ref 0.2–1.2)
Total Protein: 5.8 g/dL — ABNORMAL LOW (ref 6.1–8.1)
eGFR: 62 mL/min/{1.73_m2} (ref >=60)

## 2021-04-23 LAB — PSA: PSA: 0.28 ng/mL (ref <=4.00)

## 2021-04-23 LAB — URINALYSIS, ROUTINE W REFLEX MICROSCOPIC
Bilirubin Urine: NEGATIVE
Hgb urine dipstick: NEGATIVE
Nitrite: NEGATIVE
Specific Gravity, Urine: 1.033 (ref 1.001–1.035)
pH: 5.5 (ref 5.0–8.0)

## 2021-04-23 LAB — MICROSCOPIC MESSAGE

## 2021-04-23 LAB — MICROALBUMIN / CREATININE URINE RATIO
Creatinine, Urine: 467 mg/dL — ABNORMAL HIGH (ref 20–320)
Microalb Creat Ratio: 27 mcg/mg creat (ref <30)
Microalb, Ur: 12.4 mg/dL

## 2021-04-23 LAB — LIPID PANEL
Cholesterol: 106 mg/dL (ref <200)
HDL: 42 mg/dL (ref >=40)
LDL Cholesterol (Calc): 48 mg/dL (calc)
Non-HDL Cholesterol (Calc): 64 mg/dL (calc) (ref <130)
Total CHOL/HDL Ratio: 2.5 (calc) (ref <5.0)
Triglycerides: 77 mg/dL (ref <150)

## 2021-04-23 LAB — VITAMIN B12: Vitamin B-12: 208 pg/mL (ref 200–1100)

## 2021-04-23 LAB — VITAMIN D 25 HYDROXY (VIT D DEFICIENCY, FRACTURES): Vit D, 25-Hydroxy: 86 ng/mL (ref 30–100)

## 2021-04-23 LAB — TSH: TSH: 1.03 mIU/L (ref 0.40–4.50)

## 2021-04-27 NOTE — Progress Notes (Signed)
Carelink Summary Report / Loop Recorder 

## 2021-05-23 ENCOUNTER — Ambulatory Visit (INDEPENDENT_AMBULATORY_CARE_PROVIDER_SITE_OTHER): Payer: PPO

## 2021-05-23 DIAGNOSIS — R55 Syncope and collapse: Secondary | ICD-10-CM | POA: Diagnosis not present

## 2021-05-23 LAB — CUP PACEART REMOTE DEVICE CHECK
Date Time Interrogation Session: 20230108231355
Implantable Pulse Generator Implant Date: 20221024

## 2021-05-31 NOTE — Progress Notes (Signed)
Carelink Summary Report / Loop Recorder 

## 2021-06-06 ENCOUNTER — Other Ambulatory Visit: Payer: Self-pay

## 2021-06-06 DIAGNOSIS — N138 Other obstructive and reflux uropathy: Secondary | ICD-10-CM

## 2021-06-06 DIAGNOSIS — I10 Essential (primary) hypertension: Secondary | ICD-10-CM

## 2021-06-06 MED ORDER — TERAZOSIN HCL 10 MG PO CAPS
ORAL_CAPSULE | ORAL | 3 refills | Status: DC
Start: 2021-06-06 — End: 2021-11-01

## 2021-06-06 MED ORDER — OLMESARTAN MEDOXOMIL 20 MG PO TABS
ORAL_TABLET | ORAL | 3 refills | Status: DC
Start: 2021-06-06 — End: 2021-11-01

## 2021-06-06 MED ORDER — FINASTERIDE 5 MG PO TABS: ORAL_TABLET | ORAL | 3 refills | Status: DC

## 2021-06-27 ENCOUNTER — Ambulatory Visit (INDEPENDENT_AMBULATORY_CARE_PROVIDER_SITE_OTHER): Payer: PPO

## 2021-06-27 DIAGNOSIS — R55 Syncope and collapse: Secondary | ICD-10-CM | POA: Diagnosis not present

## 2021-06-27 LAB — CUP PACEART REMOTE DEVICE CHECK
Date Time Interrogation Session: 20230212230922
Implantable Pulse Generator Implant Date: 20221024

## 2021-06-29 NOTE — Progress Notes (Signed)
Carelink Summary Report / Loop Recorder 

## 2021-07-02 ENCOUNTER — Encounter (HOSPITAL_COMMUNITY): Payer: Self-pay | Admitting: Emergency Medicine

## 2021-07-02 ENCOUNTER — Inpatient Hospital Stay (HOSPITAL_COMMUNITY): Payer: PPO

## 2021-07-02 ENCOUNTER — Other Ambulatory Visit: Payer: Self-pay

## 2021-07-02 ENCOUNTER — Inpatient Hospital Stay (HOSPITAL_COMMUNITY)
Admission: EM | Admit: 2021-07-02 | Discharge: 2021-07-03 | DRG: 242 | Disposition: A | Discharge status: Home / Self Care | Payer: PPO | Attending: Internal Medicine | Admitting: Internal Medicine

## 2021-07-02 ENCOUNTER — Emergency Department (HOSPITAL_COMMUNITY): Payer: PPO

## 2021-07-02 DIAGNOSIS — S0001XA Abrasion of scalp, initial encounter: Secondary | ICD-10-CM | POA: Diagnosis not present

## 2021-07-02 DIAGNOSIS — I951 Orthostatic hypotension: Principal | ICD-10-CM | POA: Diagnosis present

## 2021-07-02 DIAGNOSIS — R001 Bradycardia, unspecified: Secondary | ICD-10-CM | POA: Diagnosis not present

## 2021-07-02 DIAGNOSIS — N400 Enlarged prostate without lower urinary tract symptoms: Secondary | ICD-10-CM | POA: Diagnosis not present

## 2021-07-02 DIAGNOSIS — Z888 Allergy status to other drugs, medicaments and biological substances status: Secondary | ICD-10-CM | POA: Diagnosis not present

## 2021-07-02 DIAGNOSIS — R55 Syncope and collapse: Secondary | ICD-10-CM | POA: Diagnosis present

## 2021-07-02 DIAGNOSIS — U071 COVID-19: Secondary | ICD-10-CM

## 2021-07-02 DIAGNOSIS — I442 Atrioventricular block, complete: Secondary | ICD-10-CM | POA: Diagnosis not present

## 2021-07-02 DIAGNOSIS — M0579 Rheumatoid arthritis with rheumatoid factor of multiple sites without organ or systems involvement: Secondary | ICD-10-CM

## 2021-07-02 DIAGNOSIS — M069 Rheumatoid arthritis, unspecified: Secondary | ICD-10-CM | POA: Diagnosis present

## 2021-07-02 DIAGNOSIS — Z880 Allergy status to penicillin: Secondary | ICD-10-CM | POA: Diagnosis not present

## 2021-07-02 DIAGNOSIS — I469 Cardiac arrest, cause unspecified: Secondary | ICD-10-CM | POA: Diagnosis not present

## 2021-07-02 DIAGNOSIS — F17210 Nicotine dependence, cigarettes, uncomplicated: Secondary | ICD-10-CM | POA: Diagnosis present

## 2021-07-02 DIAGNOSIS — J449 Chronic obstructive pulmonary disease, unspecified: Secondary | ICD-10-CM | POA: Diagnosis present

## 2021-07-02 DIAGNOSIS — K219 Gastro-esophageal reflux disease without esophagitis: Secondary | ICD-10-CM | POA: Diagnosis not present

## 2021-07-02 DIAGNOSIS — Z881 Allergy status to other antibiotic agents status: Secondary | ICD-10-CM

## 2021-07-02 DIAGNOSIS — I959 Hypotension, unspecified: Secondary | ICD-10-CM | POA: Diagnosis not present

## 2021-07-02 DIAGNOSIS — Y92488 Other paved roadways as the place of occurrence of the external cause: Secondary | ICD-10-CM

## 2021-07-02 DIAGNOSIS — I459 Conduction disorder, unspecified: Secondary | ICD-10-CM | POA: Diagnosis present

## 2021-07-02 DIAGNOSIS — Z8249 Family history of ischemic heart disease and other diseases of the circulatory system: Secondary | ICD-10-CM | POA: Diagnosis not present

## 2021-07-02 DIAGNOSIS — E782 Mixed hyperlipidemia: Secondary | ICD-10-CM | POA: Diagnosis present

## 2021-07-02 DIAGNOSIS — S80212A Abrasion, left knee, initial encounter: Secondary | ICD-10-CM | POA: Diagnosis not present

## 2021-07-02 DIAGNOSIS — Z79899 Other long term (current) drug therapy: Secondary | ICD-10-CM

## 2021-07-02 DIAGNOSIS — M25562 Pain in left knee: Secondary | ICD-10-CM | POA: Diagnosis not present

## 2021-07-02 DIAGNOSIS — R000 Tachycardia, unspecified: Secondary | ICD-10-CM | POA: Diagnosis not present

## 2021-07-02 DIAGNOSIS — Z800 Family history of malignant neoplasm of digestive organs: Secondary | ICD-10-CM | POA: Diagnosis not present

## 2021-07-02 DIAGNOSIS — N179 Acute kidney failure, unspecified: Secondary | ICD-10-CM

## 2021-07-02 DIAGNOSIS — I10 Essential (primary) hypertension: Secondary | ICD-10-CM | POA: Diagnosis not present

## 2021-07-02 DIAGNOSIS — M2578 Osteophyte, vertebrae: Secondary | ICD-10-CM | POA: Diagnosis not present

## 2021-07-02 DIAGNOSIS — M25569 Pain in unspecified knee: Secondary | ICD-10-CM

## 2021-07-02 DIAGNOSIS — Z886 Allergy status to analgesic agent status: Secondary | ICD-10-CM

## 2021-07-02 DIAGNOSIS — R0902 Hypoxemia: Secondary | ICD-10-CM | POA: Diagnosis not present

## 2021-07-02 DIAGNOSIS — R531 Weakness: Secondary | ICD-10-CM | POA: Diagnosis not present

## 2021-07-02 LAB — CBC WITH DIFFERENTIAL/PLATELET
Abs Immature Granulocytes: 0.06 10*3/uL (ref 0.00–0.07)
Basophils Absolute: 0 10*3/uL (ref 0.0–0.1)
Basophils Relative: 0 %
Eosinophils Absolute: 0 10*3/uL (ref 0.0–0.5)
Eosinophils Relative: 0 %
HCT: 39.8 % (ref 39.0–52.0)
Hemoglobin: 13.2 g/dL (ref 13.0–17.0)
Immature Granulocytes: 1 %
Lymphocytes Relative: 3 %
Lymphs Abs: 0.3 10*3/uL — ABNORMAL LOW (ref 0.7–4.0)
MCH: 30.7 pg (ref 26.0–34.0)
MCHC: 33.2 g/dL (ref 30.0–36.0)
MCV: 92.6 fL (ref 80.0–100.0)
Monocytes Absolute: 0.9 10*3/uL (ref 0.1–1.0)
Monocytes Relative: 8 %
Neutro Abs: 9.9 10*3/uL — ABNORMAL HIGH (ref 1.7–7.7)
Neutrophils Relative %: 88 %
Platelets: 173 10*3/uL (ref 150–400)
RBC: 4.3 MIL/uL (ref 4.22–5.81)
RDW: 13.1 % (ref 11.5–15.5)
WBC: 11.1 10*3/uL — ABNORMAL HIGH (ref 4.0–10.5)
nRBC: 0 % (ref 0.0–0.2)

## 2021-07-02 LAB — CREATININE, SERUM
Creatinine, Ser: 1.47 mg/dL — ABNORMAL HIGH (ref 0.61–1.24)
GFR, Estimated: 46 mL/min — ABNORMAL LOW (ref >60)

## 2021-07-02 LAB — MAGNESIUM: Magnesium: 2 mg/dL (ref 1.7–2.4)

## 2021-07-02 LAB — CBC
HCT: 38.5 % — ABNORMAL LOW (ref 39.0–52.0)
Hemoglobin: 12.7 g/dL — ABNORMAL LOW (ref 13.0–17.0)
MCH: 30.5 pg (ref 26.0–34.0)
MCHC: 33 g/dL (ref 30.0–36.0)
MCV: 92.5 fL (ref 80.0–100.0)
Platelets: 192 10*3/uL (ref 150–400)
RBC: 4.16 MIL/uL — ABNORMAL LOW (ref 4.22–5.81)
RDW: 13 % (ref 11.5–15.5)
WBC: 10.1 10*3/uL (ref 4.0–10.5)
nRBC: 0 % (ref 0.0–0.2)

## 2021-07-02 LAB — COMPREHENSIVE METABOLIC PANEL
ALT: 18 U/L (ref 0–44)
AST: 22 U/L (ref 15–41)
Albumin: 3.5 g/dL (ref 3.5–5.0)
Alkaline Phosphatase: 56 U/L (ref 38–126)
Anion gap: 6 (ref 5–15)
BUN: 19 mg/dL (ref 8–23)
CO2: 26 mmol/L (ref 22–32)
Calcium: 8.5 mg/dL — ABNORMAL LOW (ref 8.9–10.3)
Chloride: 104 mmol/L (ref 98–111)
Creatinine, Ser: 1.62 mg/dL — ABNORMAL HIGH (ref 0.61–1.24)
GFR, Estimated: 41 mL/min — ABNORMAL LOW (ref >60)
Glucose, Bld: 137 mg/dL — ABNORMAL HIGH (ref 70–99)
Potassium: 3.6 mmol/L (ref 3.5–5.1)
Sodium: 136 mmol/L (ref 135–145)
Total Bilirubin: 0.7 mg/dL (ref 0.3–1.2)
Total Protein: 6.4 g/dL — ABNORMAL LOW (ref 6.5–8.1)

## 2021-07-02 LAB — RESP PANEL BY RT-PCR (FLU A&B, COVID) ARPGX2
Influenza A by PCR: NEGATIVE
Influenza B by PCR: NEGATIVE
SARS Coronavirus 2 by RT PCR: POSITIVE — AB

## 2021-07-02 LAB — TROPONIN I (HIGH SENSITIVITY)
Troponin I (High Sensitivity): 15 ng/L (ref <18)
Troponin I (High Sensitivity): 11 ng/L (ref <18)

## 2021-07-02 LAB — TSH: TSH: 0.741 u[IU]/mL (ref 0.350–4.500)

## 2021-07-02 LAB — BRAIN NATRIURETIC PEPTIDE: B Natriuretic Peptide: 143.8 pg/mL — ABNORMAL HIGH (ref 0.0–100.0)

## 2021-07-02 MED ORDER — FINASTERIDE 5 MG PO TABS
5.0000 mg | ORAL_TABLET | Freq: Every day | ORAL | Status: DC
  Administered 2021-07-03: 5 mg via ORAL
  Filled 2021-07-02: qty 1

## 2021-07-02 MED ORDER — ACETAMINOPHEN 325 MG PO TABS
650.0000 mg | ORAL_TABLET | Freq: Four times a day (QID) | ORAL | Status: DC | PRN
  Administered 2021-07-02: 650 mg via ORAL
  Filled 2021-07-02: qty 2

## 2021-07-02 MED ORDER — VITAMIN D 25 MCG (1000 UNIT) PO TABS
5000.0000 [IU] | ORAL_TABLET | Freq: Every day | ORAL | Status: DC
  Administered 2021-07-02 – 2021-07-03 (×2): 5000 [IU] via ORAL
  Filled 2021-07-02 (×2): qty 5

## 2021-07-02 MED ORDER — ACETAMINOPHEN 650 MG RE SUPP: 650.0000 mg | Freq: Four times a day (QID) | RECTAL | Status: DC | PRN

## 2021-07-02 MED ORDER — SODIUM CHLORIDE 0.9% FLUSH
3.0000 mL | Freq: Two times a day (BID) | INTRAVENOUS | Status: DC
  Administered 2021-07-02: 3 mL via INTRAVENOUS

## 2021-07-02 MED ORDER — HYDROCOD POLI-CHLORPHE POLI ER 10-8 MG/5ML PO SUER: 5.0000 mL | Freq: Two times a day (BID) | ORAL | Status: DC | PRN

## 2021-07-02 MED ORDER — ZINC SULFATE 220 (50 ZN) MG PO CAPS
220.0000 mg | ORAL_CAPSULE | Freq: Every day | ORAL | Status: DC
  Administered 2021-07-02 – 2021-07-03 (×2): 220 mg via ORAL
  Filled 2021-07-02 (×2): qty 1

## 2021-07-02 MED ORDER — ASCORBIC ACID 500 MG PO TABS
500.0000 mg | ORAL_TABLET | Freq: Every day | ORAL | Status: DC
  Administered 2021-07-02 – 2021-07-03 (×2): 500 mg via ORAL
  Filled 2021-07-02 (×2): qty 1

## 2021-07-02 MED ORDER — NIRMATRELVIR/RITONAVIR (PAXLOVID) TABLET (RENAL DOSING)
2.0000 | ORAL_TABLET | Freq: Two times a day (BID) | ORAL | Status: DC
  Administered 2021-07-02 – 2021-07-03 (×2): 2 via ORAL
  Filled 2021-07-02 (×2): qty 20

## 2021-07-02 MED ORDER — ENOXAPARIN SODIUM 40 MG/0.4ML IJ SOSY
40.0000 mg | PREFILLED_SYRINGE | INTRAMUSCULAR | Status: DC
  Administered 2021-07-02: 40 mg via SUBCUTANEOUS
  Filled 2021-07-02: qty 0.4

## 2021-07-02 MED ORDER — GUAIFENESIN-DM 100-10 MG/5ML PO SYRP: 10.0000 mL | ORAL_SOLUTION | ORAL | Status: DC | PRN

## 2021-07-02 MED ORDER — LACTATED RINGERS IV BOLUS
500.0000 mL | Freq: Once | INTRAVENOUS | Status: AC
  Administered 2021-07-02: 500 mL via INTRAVENOUS

## 2021-07-02 MED ORDER — PREDNISONE 10 MG PO TABS
10.0000 mg | ORAL_TABLET | Freq: Every day | ORAL | Status: DC
  Administered 2021-07-02 – 2021-07-03 (×2): 10 mg via ORAL
  Filled 2021-07-02 (×2): qty 1

## 2021-07-02 MED ORDER — ROSUVASTATIN CALCIUM 20 MG PO TABS
20.0000 mg | ORAL_TABLET | Freq: Every day | ORAL | Status: DC
Start: 2021-07-02 — End: 2021-07-03
  Administered 2021-07-02 – 2021-07-03 (×2): 20 mg via ORAL
  Filled 2021-07-02 (×2): qty 1

## 2021-07-02 MED ORDER — SODIUM CHLORIDE 0.9 % IV SOLN: Freq: Once | INTRAVENOUS | Status: AC

## 2021-07-02 MED ORDER — LACTATED RINGERS IV SOLN
INTRAVENOUS | Status: DC
Start: 2021-07-02 — End: 2021-07-03
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
## 2021-07-02 NOTE — H&P (Signed)
History and Physical    Patient: [name] EPP:[id number] DOB: 1930/11/22 DOA: 07/02/2021 DOS: the patient was seen and examined on 07/02/2021 PCP: Unk Pinto, MD  Patient coming from: Home  Chief Complaint:  Chief Complaint  Patient presents with   Loss of Consciousness    HPI: [name] is a 88 y.o. male with medical history significant of BPH, HTN, HLD. Presenting with syncope. History is from his daughter. She reports that he was getting ready to go get food today. She heard his horn in his car beeping. She went outside and found him passed out in the seat. She tried to wake him, but he didn't respond. She pulled him from his car and called EMS. When EMS arrived, he was found to have a BP of 60/40. He was given fluids and returned to his normal mentation. He was brought to the ED for evaluation.   Of note, the daughter reports that he has been exposed to family that has been sick over the last week. The patient did not have any symptoms of respiratory illness until yesterday when he complained of a runny nose. He was otherwise ok. They deny any other aggravating or alleviating factors.   Review of Systems: As mentioned in the history of present illness. All other systems reviewed and are negative. Past Medical History:  Diagnosis Date   Allergy    Asthma    BPH (benign prostatic hypertrophy)    BPH (benign prostatic hypertrophy)    COPD (chronic obstructive pulmonary disease) (HCC)    DJD (degenerative joint disease)    GERD (gastroesophageal reflux disease)    History of urinary retention    Hypertension    IBS (irritable bowel syndrome)    RBBB    Rheumatoid arthritis(714.0)    DR. HAWKES   Scrotal lesion    Short of breath on exertion    Past Surgical History:  Procedure Laterality Date   CARDIOVASCULAR STRESS TEST  04-26-2011  DR BRACKBILL   LOW RISK STRESS NUCLEAR STUDY/ NO EVIDENCE ISCHEMIA   LOOP RECORDER INSERTION N/A 03/07/2021   Procedure: LOOP  RECORDER INSERTION;  Surgeon: Vickie Epley, MD;  Location: [place] [facility];  Service: Cardiovascular;  Laterality: N/A;   LUMBAR FUSION  X3  LAST ONE 1996   SCROTAL EXPLORATION Left 07/17/2012   Procedure:  EXCISION  OF LEFT SCROTAL SKIN LESION;  Surgeon: Molli Hazard, MD;  Location: [facility];  Service: Urology;  Laterality: Left;   TRANSTHORACIC ECHOCARDIOGRAM  04-26-2011     MILD LVH/ LVSF NORMAL/ EF 60-63%/ GRADE I DIASTOLIC DYSFUNCTION/ MILD MITRAL REGURG.   TRANSURETHRAL RESECTION OF PROSTATE  SEVERAL YRS AGO   Social History:  reports that he quit smoking about 41 years ago. His smoking use included cigarettes. He has a 30.00 pack-year smoking history. His smokeless tobacco use includes chew. He reports that he does not drink alcohol and does not use drugs.  Allergies  Allergen Reactions   Ace Inhibitors    Asa [Aspirin]     High Dose asprin   Penicillins Hives   Vasotec [Enalapril]     Cough   Clindamycin/Lincomycin     Hives     Family History  Problem Relation Age of Onset   Heart disease Father    Heart disease Brother    Colon cancer Brother 77    Prior to Admission medications   Medication Sig Start Date End Date Taking? Authorizing Provider  Cholecalciferol (VITAMIN  D) 125 MCG (5000 UT) CAPS Take 5,000 Units by mouth daily.   Yes [provider]  finasteride (PROSCAR) 5 MG tablet Take  1 tablet  Daily  for Prostate 06/06/21  Yes Unk Pinto, MD  olmesartan (BENICAR) 20 MG tablet Take 1 tablet  Daily  for BP 06/06/21  Yes Unk Pinto, MD  predniSONE (DELTASONE) 5 MG tablet Take 1 tablet 2 x / day or as directed for Asthma & Rheumatoid Arthritis Patient taking differently: Take 10 mg by mouth daily. 01/25/21  Yes Unk Pinto, MD  rosuvastatin (CRESTOR) 20 MG tablet Take  1 tablet  Daily  for Cholesterol Patient taking differently: Take 20 mg by mouth daily. 08/20/20  Yes Unk Pinto, MD  terazosin (HYTRIN) 10  MG capsule Take  1 capsule  at Bedtime  for Prostate Patient taking differently: Take 10 mg by mouth at bedtime. Take  1 capsule  at Bedtime  for Prostate 06/06/21  Yes Unk Pinto, MD  albuterol (VENTOLIN HFA) 108 (90 Base) MCG/ACT inhaler Inhale 1-2 puffs into the lungs every 4 (four) hours as needed for wheezing or shortness of breath. Patient not taking: Reported on 07/02/2021 04/20/21   Liane Comber, NP    Physical Exam: Vitals:   07/02/21 1200 07/02/21 1334  BP: (!) 144/66 (!) 125/57  Pulse: 92 92  Resp: 17 15  Temp: 98.3 F (36.8 C)   TempSrc: Oral   SpO2: 97% 98%   General: 88 y.o. male resting in bed in NAD Eyes: PERRL, normal sclera ENMT: Nares patent w/o discharge, orophaynx clear, dentition normal, ears w/o discharge/lesions/ulcers Neck: Supple, trachea midline Cardiovascular: RRR, +S1, S2, no m/g/r, equal pulses throughout Respiratory: CTABL, no w/r/r, normal WOB GI: BS+, NDNT, no masses noted, no organomegaly noted MSK: No e/c/c Neuro: A&O x 3, no focal deficits Psyc: Appropriate interaction and affect, calm/cooperative  Data Reviewed:  Scr 1.62 WBC 11.1  CTH/c-spine: 1. No acute intracranial pathology. 2. No fracture or static subluxation of the cervical spine.  CXR: No active disease.  Assessment and Plan: No notes have been filed under this hospital service. Service: Hospitalist Syncope     - place in obs, tele     - orthostatic positive; give fluids     - has ongoing outpt w/u for syncope going with cardiology; has a loop recorder in place; cards to interrogate; appreciate assistance     - check Echo, EEG, trend trp     - COVID positive; tx w/ paxlovid     - check UA  COVID 19 infection     - exposure to 2 sick family members in the last week (non-confirmed to have COVID, but has symptoms suggestive)     - he had no symptoms until yesterday (runny nose)     - he's on room air     - give syncope and possibility of it being infection driven,  will start paxlovid     - follow inflammatory markers  AKI     - check renal US     - give fluids     - watch nephrotoxins, hold ARB  HTN     - hold ARB d/t AKI  HLD     - continue statin  BPH     - hold terazosin; increased of hypotension/syncope  Rheumatoid arthritis     - continue home regimen  Advance Care Planning:   Code Status: FULL  Consults: EDP spoke with cardiology  Family Communication: w/ dtr at  bedside  Severity of Illness: The appropriate patient status for this patient is OBSERVATION. Observation status is judged to be reasonable and necessary in order to provide the required intensity of service to ensure the patient's safety. The patient's presenting symptoms, physical exam findings, and initial radiographic and laboratory data in the context of their medical condition is felt to place them at decreased risk for further clinical deterioration. Furthermore, it is anticipated that the patient will be medically stable for discharge from the hospital within 2 midnights of admission.   Author: Jonnie Finner, DO 07/02/2021 2:34 PM  For on call review [email or web address].

## 2021-07-02 NOTE — Progress Notes (Addendum)
Dr. Lovena Le with EP team reviewed device interrogation back on Mr. Barajas's loop recorder and per his report, patient had about 10 sec or so of heart block likely as the cause for his syncope. He also had had 2:1 AV block on telemetry. Dr. Lovena Le recommends transfer to [facility] for EP to see in AM for likely pacemaker. He recommends NPO after midnight. I put in an order to keep pacer at bedside as a precaution and also place pacer pads on patient. I talked with attending Dr. Marylyn Ishihara who agreed to help facilitate [facility] to [facility] the internal medicine service. (Dr. Lovena Le prefers IM continue to manage Covid issues.) I relayed this to the [facility] ED team to notify the patient of update in plan. Will add baseline TSH to labs. Will also s/o to fellow to make them aware of patient pending transfer for EP to see tomorrow morning.

## 2021-07-02 NOTE — ED Triage Notes (Signed)
Pt from home via [facility] with reports of syncope. According to Ems pt had LOC while getting out of his car at home. PT was pulling out of the car by family. Upon EMS arrival pts BP 60/40. Pt was given 567ml Nacl en route. PT normotensive at this time. Denies pain.

## 2021-07-02 NOTE — Progress Notes (Signed)
Attempted bedside EEG. Patient's daughter was bedside and refused the EEG for now until she is able to speak to physician about it.  RN notified. EEG on hold until later

## 2021-07-03 ENCOUNTER — Inpatient Hospital Stay (HOSPITAL_COMMUNITY): Payer: PPO

## 2021-07-03 ENCOUNTER — Encounter (HOSPITAL_COMMUNITY)
Admission: EM | Disposition: A | Discharge status: Home / Self Care | Payer: Self-pay | Source: Home / Self Care | Attending: Internal Medicine

## 2021-07-03 DIAGNOSIS — I442 Atrioventricular block, complete: Secondary | ICD-10-CM

## 2021-07-03 DIAGNOSIS — R55 Syncope and collapse: Secondary | ICD-10-CM

## 2021-07-03 DIAGNOSIS — R001 Bradycardia, unspecified: Secondary | ICD-10-CM | POA: Diagnosis not present

## 2021-07-03 DIAGNOSIS — I459 Conduction disorder, unspecified: Secondary | ICD-10-CM

## 2021-07-03 HISTORY — PX: PACEMAKER IMPLANT: EP1218

## 2021-07-03 HISTORY — PX: LOOP RECORDER REMOVAL: EP1215

## 2021-07-03 LAB — COMPREHENSIVE METABOLIC PANEL
ALT: 16 U/L (ref 0–44)
AST: 26 U/L (ref 15–41)
Albumin: 2.8 g/dL — ABNORMAL LOW (ref 3.5–5.0)
Alkaline Phosphatase: 53 U/L (ref 38–126)
Anion gap: 9 (ref 5–15)
BUN: 19 mg/dL (ref 8–23)
CO2: 25 mmol/L (ref 22–32)
Calcium: 8.3 mg/dL — ABNORMAL LOW (ref 8.9–10.3)
Chloride: 102 mmol/L (ref 98–111)
Creatinine, Ser: 1.38 mg/dL — ABNORMAL HIGH (ref 0.61–1.24)
GFR, Estimated: 49 mL/min — ABNORMAL LOW (ref >60)
Glucose, Bld: 110 mg/dL — ABNORMAL HIGH (ref 70–99)
Potassium: 4 mmol/L (ref 3.5–5.1)
Sodium: 136 mmol/L (ref 135–145)
Total Bilirubin: 1 mg/dL (ref 0.3–1.2)
Total Protein: 5.2 g/dL — ABNORMAL LOW (ref 6.5–8.1)

## 2021-07-03 LAB — ECHOCARDIOGRAM COMPLETE
Area-P 1/2: 1.98 cm2
Height: 77 in
S' Lateral: 3.5 cm
Weight: 4476.22 oz

## 2021-07-03 LAB — CBC WITH DIFFERENTIAL/PLATELET
Abs Immature Granulocytes: 0.07 10*3/uL (ref 0.00–0.07)
Basophils Absolute: 0 10*3/uL (ref 0.0–0.1)
Basophils Relative: 0 %
Eosinophils Absolute: 0 10*3/uL (ref 0.0–0.5)
Eosinophils Relative: 0 %
HCT: 36.5 % — ABNORMAL LOW (ref 39.0–52.0)
Hemoglobin: 12.5 g/dL — ABNORMAL LOW (ref 13.0–17.0)
Immature Granulocytes: 1 %
Lymphocytes Relative: 14 %
Lymphs Abs: 1.2 10*3/uL (ref 0.7–4.0)
MCH: 31.6 pg (ref 26.0–34.0)
MCHC: 34.2 g/dL (ref 30.0–36.0)
MCV: 92.2 fL (ref 80.0–100.0)
Monocytes Absolute: 1.3 10*3/uL — ABNORMAL HIGH (ref 0.1–1.0)
Monocytes Relative: 16 %
Neutro Abs: 5.9 10*3/uL (ref 1.7–7.7)
Neutrophils Relative %: 69 %
Platelets: 175 10*3/uL (ref 150–400)
RBC: 3.96 MIL/uL — ABNORMAL LOW (ref 4.22–5.81)
RDW: 13.2 % (ref 11.5–15.5)
WBC: 8.5 10*3/uL (ref 4.0–10.5)
nRBC: 0 % (ref 0.0–0.2)

## 2021-07-03 LAB — URINALYSIS, ROUTINE W REFLEX MICROSCOPIC
Bilirubin Urine: NEGATIVE
Glucose, UA: NEGATIVE mg/dL
Hgb urine dipstick: NEGATIVE
Ketones, ur: NEGATIVE mg/dL
Leukocytes,Ua: NEGATIVE
Nitrite: NEGATIVE
Protein, ur: NEGATIVE mg/dL
Specific Gravity, Urine: 1.008 (ref 1.005–1.030)
pH: 6 (ref 5.0–8.0)

## 2021-07-03 LAB — C-REACTIVE PROTEIN: CRP: 3.9 mg/dL — ABNORMAL HIGH (ref <1.0)

## 2021-07-03 LAB — SURGICAL PCR SCREEN
MRSA, PCR: NEGATIVE
Staphylococcus aureus: NEGATIVE

## 2021-07-03 LAB — FERRITIN: Ferritin: 166 ng/mL (ref 24–336)

## 2021-07-03 LAB — D-DIMER, QUANTITATIVE: D-Dimer, Quant: 1.19 ug/mL-FEU — ABNORMAL HIGH (ref 0.00–0.50)

## 2021-07-03 SURGERY — PACEMAKER IMPLANT
Anesthesia: LOCAL

## 2021-07-03 MED ORDER — CHLORHEXIDINE GLUCONATE 4 % EX LIQD: 60.0000 mL | Freq: Once | CUTANEOUS | Status: DC

## 2021-07-03 MED ORDER — MUPIROCIN 2 % EX OINT
TOPICAL_OINTMENT | Freq: Two times a day (BID) | CUTANEOUS | Status: DC
  Filled 2021-07-03: qty 22

## 2021-07-03 MED ORDER — PERFLUTREN LIPID MICROSPHERE
1.0000 mL | INTRAVENOUS | Status: DC | PRN
  Administered 2021-07-03: 2 mL via INTRAVENOUS
  Filled 2021-07-03: qty 10

## 2021-07-03 MED ORDER — SODIUM CHLORIDE 0.9 % IV SOLN
80.0000 mg | INTRAVENOUS | Status: AC
  Administered 2021-07-03: 80 mg

## 2021-07-03 MED ORDER — MIDAZOLAM HCL 5 MG/5ML IJ SOLN
INTRAMUSCULAR | Status: AC
  Filled 2021-07-03: qty 5

## 2021-07-03 MED ORDER — SODIUM CHLORIDE 0.9 % IV SOLN: INTRAVENOUS | Status: DC

## 2021-07-03 MED ORDER — FENTANYL CITRATE (PF) 100 MCG/2ML IJ SOLN
INTRAMUSCULAR | Status: AC
  Filled 2021-07-03: qty 2

## 2021-07-03 MED ORDER — VANCOMYCIN HCL 1500 MG/300ML IV SOLN
1500.0000 mg | INTRAVENOUS | Status: AC
  Administered 2021-07-03: 1500 mg via INTRAVENOUS
  Filled 2021-07-03 (×2): qty 300

## 2021-07-03 MED ORDER — NIRMATRELVIR/RITONAVIR (PAXLOVID) TABLET (RENAL DOSING): 2.0000 | ORAL_TABLET | Freq: Two times a day (BID) | ORAL | 0 refills | Status: AC

## 2021-07-03 MED ORDER — HEPARIN (PORCINE) IN NACL 1000-0.9 UT/500ML-% IV SOLN
INTRAVENOUS | Status: AC
  Filled 2021-07-03: qty 500

## 2021-07-03 MED ORDER — HEPARIN (PORCINE) IN NACL 1000-0.9 UT/500ML-% IV SOLN
INTRAVENOUS | Status: DC | PRN
  Administered 2021-07-03: 500 mL

## 2021-07-03 MED ORDER — SODIUM CHLORIDE 0.45 % IV SOLN: INTRAVENOUS | Status: DC

## 2021-07-03 MED ORDER — LIDOCAINE HCL (PF) 1 % IJ SOLN
INTRAMUSCULAR | Status: DC | PRN
  Administered 2021-07-03: 60 mL

## 2021-07-03 MED ORDER — SODIUM CHLORIDE 0.9 % IV SOLN
INTRAVENOUS | Status: AC
  Filled 2021-07-03: qty 2

## 2021-07-03 MED ORDER — LIDOCAINE HCL 1 % IJ SOLN
INTRAMUSCULAR | Status: AC
  Filled 2021-07-03: qty 60

## 2021-07-03 SURGICAL SUPPLY — 14 items
CABLE SURGICAL S-101-97-12 (CABLE) ×2 IMPLANT
CATH RIGHTSITE C315HIS02 (CATHETERS) ×1 IMPLANT
IPG PACE AZUR XT DR MRI W1DR01 (Pacemaker) IMPLANT
LEAD CAPSURE NOVUS 5076-52CM (Lead) ×1 IMPLANT
LEAD SELECT SECURE 3830 383069 (Lead) IMPLANT
MAT PREVALON FULL STRYKER (MISCELLANEOUS) ×1 IMPLANT
PACE AZURE XT DR MRI W1DR01 (Pacemaker) ×2 IMPLANT
PAD DEFIB RADIO PHYSIO CONN (PAD) ×2 IMPLANT
SELECT SECURE 3830 383069 (Lead) ×2 IMPLANT
SHEATH 7FR PRELUDE SNAP 13 (SHEATH) ×1 IMPLANT
SHEATH 9FR PRELUDE SNAP 13 (SHEATH) ×1 IMPLANT
SLITTER 6232ADJ (MISCELLANEOUS) ×1 IMPLANT
TRAY PACEMAKER INSERTION (PACKS) ×2 IMPLANT
WIRE HI TORQ VERSACORE-J 145CM (WIRE) ×1 IMPLANT

## 2021-07-03 NOTE — Progress Notes (Signed)
Cardiology Consultation:   Patient ID: ERCELL RAZON MRN: [id number]; DOB: 06/25/1930  Admit date: 07/02/2021 Date of Consult: 07/03/2021  PCP:  Unk Pinto, MD   [facility] Providers Cardiologist:  None  Electrophysiologist:  Vickie Epley, MD       Patient Profile:   [name] is a 88 y.o. male with a hx of syncope who is being seen 07/03/2021 for the evaluation of recurrent syncope at the request of Dr. Quentin Ore.  History of Present Illness:   [name] has a h/o syncope and bifascicular block who presented with syncope and underwent ILR insertion several months ago. He was in his usual state of health yesterday when he passed out. No warning. He was out for over 10 seconds. He went to the ED where he was found to have Covid. His ILR was interrogated and he had asystole that correlated with the spell and he is admitted in transfer to consider PPM insertion. He has not had fever. His covid symptoms are a cough with congestion.    Past Medical History:  Diagnosis Date   Allergy    Asthma    BPH (benign prostatic hypertrophy)    BPH (benign prostatic hypertrophy)    COPD (chronic obstructive pulmonary disease) (HCC)    DJD (degenerative joint disease)    GERD (gastroesophageal reflux disease)    History of urinary retention    Hypertension    IBS (irritable bowel syndrome)    RBBB    Rheumatoid arthritis(714.0)    DR. HAWKES   Scrotal lesion    Short of breath on exertion     Past Surgical History:  Procedure Laterality Date   CARDIOVASCULAR STRESS TEST  04-26-2011  DR BRACKBILL   LOW RISK STRESS NUCLEAR STUDY/ NO EVIDENCE ISCHEMIA   LOOP RECORDER INSERTION N/A 03/07/2021   Procedure: LOOP RECORDER INSERTION;  Surgeon: Vickie Epley, MD;  Location: [place] [facility];  Service: Cardiovascular;  Laterality: N/A;   LUMBAR FUSION  X3  LAST ONE 1996   SCROTAL EXPLORATION Left 07/17/2012   Procedure:  EXCISION  OF LEFT SCROTAL SKIN LESION;  Surgeon: Molli Hazard, MD;  Location: [facility];  Service: Urology;  Laterality: Left;   TRANSTHORACIC ECHOCARDIOGRAM  04-26-2011     MILD LVH/ LVSF NORMAL/ EF 85-46%/ GRADE I DIASTOLIC DYSFUNCTION/ MILD MITRAL REGURG.   TRANSURETHRAL RESECTION OF PROSTATE  SEVERAL YRS AGO     Home Medications:  Prior to Admission medications   Medication Sig Start Date End Date Taking? Authorizing Provider  Cholecalciferol (VITAMIN D) 125 MCG (5000 UT) CAPS Take 5,000 Units by mouth daily.   Yes [provider]  finasteride (PROSCAR) 5 MG tablet Take  1 tablet  Daily  for Prostate 06/06/21  Yes Unk Pinto, MD  olmesartan (BENICAR) 20 MG tablet Take 1 tablet  Daily  for BP 06/06/21  Yes Unk Pinto, MD  predniSONE (DELTASONE) 5 MG tablet Take 1 tablet 2 x / day or as directed for Asthma & Rheumatoid Arthritis Patient taking differently: Take 10 mg by mouth daily. 01/25/21  Yes Unk Pinto, MD  rosuvastatin (CRESTOR) 20 MG tablet Take  1 tablet  Daily  for Cholesterol Patient taking differently: Take 20 mg by mouth daily. 08/20/20  Yes Unk Pinto, MD  terazosin (HYTRIN) 10 MG capsule Take  1 capsule  at Bedtime  for Prostate Patient taking differently: Take 10 mg by mouth at bedtime. Take  1 capsule  at Bedtime  for Prostate 06/06/21  Yes Unk Pinto, MD  albuterol (VENTOLIN HFA) 108 (90 Base) MCG/ACT inhaler Inhale 1-2 puffs into the lungs every 4 (four) hours as needed for wheezing or shortness of breath. Patient not taking: Reported on 07/02/2021 04/20/21   Liane Comber, NP    Inpatient Medications: Scheduled Meds:  vitamin C  500 mg Oral Daily   cholecalciferol  5,000 Units Oral Daily   enoxaparin (LOVENOX) injection  40 mg Subcutaneous Q24H   finasteride  5 mg Oral Daily   nirmatrelvir/ritonavir EUA (renal dosing)  2 tablet Oral BID   predniSONE  10 mg Oral Daily   rosuvastatin  20 mg Oral Daily   sodium chloride flush  3 mL Intravenous Q12H   zinc sulfate   220 mg Oral Daily   Continuous Infusions:  lactated ringers 125 mL/hr at 07/03/21 0817   PRN Meds: acetaminophen **OR** acetaminophen, chlorpheniramine-HYDROcodone, guaiFENesin-dextromethorphan  Allergies:    Allergies  Allergen Reactions   Ace Inhibitors    Asa [Aspirin]     High Dose asprin   Penicillins Hives   Vasotec [Enalapril]     Cough   Clindamycin/Lincomycin     Hives     Social History:   Social History   Socioeconomic History   Marital status: Married    Spouse name: Not on file   Number of children: Not on file   Years of education: Not on file   Highest education level: Not on file  Occupational History   Occupation: RETIRED    Employer: RETIRED  Tobacco Use   Smoking status: Former    Packs/day: 1.00    Years: 30.00    Pack years: 30.00    Types: Cigarettes    Quit date: 07/11/1980    Years since quitting: 41.0   Smokeless tobacco: Current    Types: Chew  Vaping Use   Vaping Use: Never used  Substance and Sexual Activity   Alcohol use: No   Drug use: No   Sexual activity: Not on file  Other Topics Concern   Not on file  Social History Narrative   Not on file   Social Determinants of Health   Financial Resource Strain: Not on file  Food Insecurity: Not on file  Transportation Needs: Not on file  Physical Activity: Not on file  Stress: Not on file  Social Connections: Not on file  Intimate Partner Violence: Not on file    Family History:    Family History  Problem Relation Age of Onset   Heart disease Father    Heart disease Brother    Colon cancer Brother 64     ROS:  Please see the history of present illness.   All other ROS reviewed and negative.     Physical Exam/Data:   Vitals:   07/02/21 2335 07/03/21 0621 07/03/21 0626 07/03/21 0908  BP: 120/60 115/61 120/65 116/70  Pulse: 86 (!) 32 71 65  Resp:      Temp: 99.3 F (37.4 C) 98.9 F (37.2 C)  98.3 F (36.8 C)  TempSrc: Oral Oral  Oral  SpO2: 91% 91% 94% 97%   Weight:  126.9 kg    Height:        Intake/Output Summary (Last 24 hours) at 07/03/2021 1003 Last data filed at 07/03/2021 0736 Gross per 24 hour  Intake 500 ml  Output 1150 ml  Net -650 ml   Last 3 Weights 07/03/2021 04/21/2021 04/20/2021  Weight (lbs) 279 lb 12.2 oz 278 lb  12.8 oz 279 lb 12.8 oz  Weight (kg) 126.9 kg 126.463 kg 126.916 kg     Body mass index is 33.18 kg/m.  General:  elderly, Well nourished, well developed, in no acute distress HEENT: normal Neck: no JVD Vascular: No carotid bruits; Distal pulses 2+ bilaterally Cardiac:  normal S1, S2; RRR; no murmur  Lungs:  clear to auscultation bilaterally, no wheezing, rhonchi or rales  Abd: soft, nontender, no hepatomegaly  Ext: no edema Musculoskeletal:  No deformities, BUE and BLE strength normal and equal Skin: warm and dry  Neuro:  CNs 2-12 intact, no focal abnormalities noted Psych:  Normal affect   EKG:  The EKG was personally reviewed and demonstrates:  NSR with RBBB and first degree AV block Telemetry:  Telemetry was personally reviewed and demonstrates:  NSR  Relevant CV Studies: none  Laboratory Data:  High Sensitivity Troponin:   Recent Labs  Lab 07/02/21 1215 07/02/21 1524  TROPONINIHS 15 11     Chemistry Recent Labs  Lab 07/02/21 1215 07/02/21 1827 07/03/21 0323  NA 136  --  136  K 3.6  --  4.0  CL 104  --  102  CO2 26  --  25  GLUCOSE 137*  --  110*  BUN 19  --  19  CREATININE 1.62* 1.47* 1.38*  CALCIUM 8.5*  --  8.3*  MG 2.0  --   --   GFRNONAA 41* 46* 49*  ANIONGAP 6  --  9    Recent Labs  Lab 07/02/21 1215 07/03/21 0323  PROT 6.4* 5.2*  ALBUMIN 3.5 2.8*  AST 22 26  ALT 18 16  ALKPHOS 56 53  BILITOT 0.7 1.0   Lipids No results for input(s): CHOL, TRIG, HDL, LABVLDL, LDLCALC, CHOLHDL in the last 168 hours.  Hematology Recent Labs  Lab 07/02/21 1215 07/02/21 1827 07/03/21 0323  WBC 11.1* 10.1 8.5  RBC 4.30 4.16* 3.96*  HGB 13.2 12.7* 12.5*  HCT 39.8 38.5* 36.5*   MCV 92.6 92.5 92.2  MCH 30.7 30.5 31.6  MCHC 33.2 33.0 34.2  RDW 13.1 13.0 13.2  PLT 173 192 175   Thyroid  Recent Labs  Lab 07/02/21 1827  TSH 0.741    BNP Recent Labs  Lab 07/02/21 1218  BNP 143.8*    DDimer  Recent Labs  Lab 07/03/21 0323  DDIMER 1.19*     Radiology/Studies:  DG Knee 1-2 Views Left  Result Date: 07/03/2021 CLINICAL DATA:  Left knee pain EXAM: LEFT KNEE - 1-2 VIEW COMPARISON:  None. FINDINGS: There is no evidence of acute fracture or dislocation. Trace joint effusion. There is tricompartment osteophyte formation and chondrocalcinosis. Suprapatellar enthesophyte formation. IMPRESSION: No acute fracture.  Trace joint effusion. Mild Tricompartment osteoarthritis and/or CPPD arthropathy. Electronically Signed   By: Maurine Simmering M.D.   On: 07/03/2021 09:44   CT HEAD WO CONTRAST (5MM)  Result Date: 07/02/2021 CLINICAL DATA:  Loss of consciousness, hit head EXAM: CT HEAD WITHOUT CONTRAST CT CERVICAL SPINE WITHOUT CONTRAST TECHNIQUE: Multidetector CT imaging of the head and cervical spine was performed following the standard protocol without intravenous contrast. Multiplanar CT image reconstructions of the cervical spine were also generated. RADIATION DOSE REDUCTION: This exam was performed according to the departmental dose-optimization program which includes automated exposure control, adjustment of the mA and/or kV according to patient size and/or use of iterative reconstruction technique. COMPARISON:  03/05/2021 FINDINGS: CT HEAD FINDINGS Brain: No evidence of acute infarction, hemorrhage, hydrocephalus, extra-axial collection or mass lesion/mass  effect. Vascular: No hyperdense vessel or unexpected calcification. Skull: Normal. Negative for fracture or focal lesion. Sinuses/Orbits: No acute finding. Other: None. CT CERVICAL SPINE FINDINGS Alignment: Normal. Skull base and vertebrae: No acute fracture. No primary bone lesion or focal pathologic process. Soft tissues  and spinal canal: No prevertebral fluid or swelling. No visible canal hematoma. Disc levels: Focally moderate disc space height loss and osteophytosis of C5 through C7 with otherwise intact disc spaces. Upper chest: Negative. Other: None. IMPRESSION: 1. No acute intracranial pathology. 2. No fracture or static subluxation of the cervical spine. 3. Focally moderate cervical disc degenerative disease of C5 through C7. Electronically Signed   By: Delanna Ahmadi M.D.   On: 07/02/2021 13:19   CT Cervical Spine Wo Contrast  Result Date: 07/02/2021 CLINICAL DATA:  Loss of consciousness, hit head EXAM: CT HEAD WITHOUT CONTRAST CT CERVICAL SPINE WITHOUT CONTRAST TECHNIQUE: Multidetector CT imaging of the head and cervical spine was performed following the standard protocol without intravenous contrast. Multiplanar CT image reconstructions of the cervical spine were also generated. RADIATION DOSE REDUCTION: This exam was performed according to the departmental dose-optimization program which includes automated exposure control, adjustment of the mA and/or kV according to patient size and/or use of iterative reconstruction technique. COMPARISON:  03/05/2021 FINDINGS: CT HEAD FINDINGS Brain: No evidence of acute infarction, hemorrhage, hydrocephalus, extra-axial collection or mass lesion/mass effect. Vascular: No hyperdense vessel or unexpected calcification. Skull: Normal. Negative for fracture or focal lesion. Sinuses/Orbits: No acute finding. Other: None. CT CERVICAL SPINE FINDINGS Alignment: Normal. Skull base and vertebrae: No acute fracture. No primary bone lesion or focal pathologic process. Soft tissues and spinal canal: No prevertebral fluid or swelling. No visible canal hematoma. Disc levels: Focally moderate disc space height loss and osteophytosis of C5 through C7 with otherwise intact disc spaces. Upper chest: Negative. Other: None. IMPRESSION: 1. No acute intracranial pathology. 2. No fracture or static  subluxation of the cervical spine. 3. Focally moderate cervical disc degenerative disease of C5 through C7. Electronically Signed   By: Delanna Ahmadi M.D.   On: 07/02/2021 13:19   US RENAL  Result Date: 07/02/2021 CLINICAL DATA:  88 year old male with acute kidney injury. EXAM: RENAL / URINARY TRACT ULTRASOUND COMPLETE COMPARISON:  12/24/2008 ultrasound FINDINGS: Right Kidney: Renal measurements: 13.1 x 6.5 x 6.8 cm = volume: 311 mL. Cortical thinning is noted. Echogenicity within normal limits. No mass or hydronephrosis visualized. Left Kidney: Renal measurements: 14.4 x 5.3 x 6.3 cm = volume: 254 mL. Cortical thinning is noted. Echogenicity within normal limits. No mass or hydronephrosis visualized. Bladder: Appears normal for degree of bladder distention. Other: None. IMPRESSION: 1. Unremarkable kidneys except for bilateral renal cortical thinning. No evidence of hydronephrosis. Electronically Signed   By: Margarette Canada M.D.   On: 07/02/2021 19:20   DG Chest Portable 1 View  Result Date: 07/02/2021 CLINICAL DATA:  Syncope EXAM: PORTABLE CHEST 1 VIEW COMPARISON:  04/21/2021 FINDINGS: Transverse diameter of heart is slightly increased. There are no signs of pulmonary edema or focal pulmonary consolidation. There is no significant pleural effusion or pneumothorax. Implantable cardiac monitoring device is noted in the left chest wall. IMPRESSION: No active disease. Electronically Signed   By: Elmer Picker M.D.   On: 07/02/2021 12:38     Assessment and Plan:   Stokes Adams syncope - he has baseline bifascicular block and passed out yesterday with over 10 seconds of asystole recorded on his ILR. I have discussed the indications/risks/benefits/goals/expectations of PPM insertion and  he wishes to proceed. HTN - after his PPM insertion he could take a beta blocker if needed.    Risk Assessment/Risk Scores:        For questions or updates, please contact [place] Please consult [email or web address]  for contact info under    Signed, Cristopher Peru, MD  07/03/2021 10:03 AM

## 2021-07-03 NOTE — Evaluation (Signed)
Physical Therapy Evaluation Patient Details Name: [name] MRN: [id number] DOB: 19-Apr-1931 Today's Date: 07/03/2021  History of Present Illness  88 y.o. male presents to [facility] hospital on 07/02/2021 after syncopal episode. Pt found by daughter passed out in front seat of vehicle. Pt found to be COVID+. Pt underwent pacemaker placement on 07/03/2021. PMH includes BPH, HTN, HLD.  Clinical Impression  Pt presents to PT with deficits in balance, gait, functional mobility, endurance, power, strength. Pt's daughter reports that when being pulled out of the car the pt landed on left knee. Pt does demonstrate reduced stance time on LLE during gait but denies pain. PT encourages AROM of L knee to improve ROM and strength. Pt will benefit from utilizing a cane at the time of discharge to improve stability and reduce falls risk.       Recommendations for follow up therapy are one component of a multi-disciplinary discharge planning process, led by the attending physician.  Recommendations may be updated based on patient status, additional functional criteria and insurance authorization.  Follow Up Recommendations No PT follow up    Assistance Recommended at Discharge PRN  Patient can return home with the following  A little help with walking and/or transfers;A little help with bathing/dressing/bathroom;Assistance with cooking/housework;Assist for transportation    Equipment Recommendations Cane  Recommendations for Other Services       Functional Status Assessment Patient has had a recent decline in their functional status and demonstrates the ability to make significant improvements in function in a reasonable and predictable amount of time.     Precautions / Restrictions Precautions Precautions: Fall;ICD/Pacemaker Precaution Comments: provided with handout for PPM precautions Required Braces or Orthoses: Sling Restrictions Weight Bearing Restrictions: Yes Other Position/Activity Restrictions:  limit pushing/pulling through LUE      Mobility  Bed Mobility Overal bed mobility: Needs Assistance Bed Mobility: Supine to Sit     Supine to sit: Supervision          Transfers Overall transfer level: Needs assistance Equipment used: None Transfers: Sit to/from Stand Sit to Stand: Min guard                Ambulation/Gait Ambulation/Gait assistance: Min guard Gait Distance (Feet): 60 Feet Assistive device: None, IV Pole Gait Pattern/deviations: Step-through pattern, Decreased stance time - left Gait velocity: reduced Gait velocity interpretation: <1.8 ft/sec, indicate of risk for recurrent falls   General Gait Details: pt with slowed step-to gait, increased lateral sway with one rightward loss of balance without UE support. Balance improved with unilateral UE support of IV pole  Stairs            Wheelchair Mobility    Modified Rankin (Stroke Patients Only)       Balance Overall balance assessment: Needs assistance Sitting-balance support: No upper extremity supported, Feet supported Sitting balance-Leahy Scale: Good     Standing balance support: No upper extremity supported, During functional activity Standing balance-Leahy Scale: Fair                               Pertinent Vitals/Pain Pain Assessment Pain Assessment: No/denies pain    Home Living Family/patient expects to be discharged to:: Private residence Living Arrangements: Spouse/significant other Available Help at Discharge: Family;Available PRN/intermittently Type of Home: House Home Access: Ramped entrance       Home Layout: One level Home Equipment: Conservation officer, nature (2 wheels);BSC/3in1;Wheelchair - manual;Shower seat Additional Comments: equipment is spouse's, spouse  is much shorter and equipment will not reach appropriate height for the patient    Prior Function Prior Level of Function : Independent/Modified Independent;Driving                      Hand Dominance   Dominant Hand: Right    Extremity/Trunk Assessment   Upper Extremity Assessment Upper Extremity Assessment: LUE deficits/detail LUE Deficits / Details: limited by PPM precautions, in sling    Lower Extremity Assessment Lower Extremity Assessment: Overall WFL for tasks assessed (appaears uncomfortable with LLE during stance phase)    Cervical / Trunk Assessment Cervical / Trunk Assessment: Normal  Communication   Communication: No difficulties  Cognition Arousal/Alertness: Awake/alert Behavior During Therapy: WFL for tasks assessed/performed Overall Cognitive Status: Within Functional Limits for tasks assessed                                          General Comments General comments (skin integrity, edema, etc.): VSS on RA    Exercises     Assessment/Plan    PT Assessment Patient needs continued PT services  PT Problem List Decreased activity tolerance;Decreased balance;Decreased mobility;Decreased strength;Decreased knowledge of use of DME;Decreased knowledge of precautions       PT Treatment Interventions DME instruction;Gait training;Functional mobility training;Therapeutic activities;Balance training;Neuromuscular re-education;Patient/family education    PT Goals (Current goals can be found in the Care Plan section)  Acute Rehab PT Goals Patient Stated Goal: to return home PT Goal Formulation: With patient Time For Goal Achievement: 07/17/21 Potential to Achieve Goals: Good    Frequency Min 3X/week     Co-evaluation               AM-PAC PT "6 Clicks" Mobility  Outcome Measure Help needed turning from your back to your side while in a flat bed without using bedrails?: A Little Help needed moving from lying on your back to sitting on the side of a flat bed without using bedrails?: A Little Help needed moving to and from a bed to a chair (including a wheelchair)?: A Little Help needed standing up from a chair  using your arms (e.g., wheelchair or bedside chair)?: A Little Help needed to walk in hospital room?: A Little Help needed climbing 3-5 steps with a railing? : Total 6 Click Score: 16    End of Session   Activity Tolerance: Patient tolerated treatment well Patient left: in bed;with call bell/phone within reach;with family/visitor present Nurse Communication: Mobility status PT Visit Diagnosis: Other abnormalities of gait and mobility (R26.89);Unsteadiness on feet (R26.81)    Time: 1445-1520 PT Time Calculation (min) (ACUTE ONLY): 35 min   Charges:   PT Evaluation $PT Eval Low Complexity: [place], PT, DPT Acute Rehabilitation Pager: [phone] Office [phone]   Zenaida Niece 07/03/2021, 3:49 PM

## 2021-07-03 NOTE — Progress Notes (Signed)
Arrived to patient's room. Patient/family declined IV. Discussed POC with nurse. Patient to be discharged today. Fran Lowes, RN VAST

## 2021-07-03 NOTE — Progress Notes (Addendum)
Per Dr. Tanna Furry request, I have sent a message to our office's EP scheduler requesting wound check visit in 10-14 days and f/u with Dr. Quentin Ore in 3 months and our office will call the patient with this information. Post pacer instructions in DC instructions also outlined per his recommendations on activity, driving and showering. Dr. Lovena Le felt patient could go home this afternoon if he ambulated safely but recommended to avoid full PT (ambulation only). Arm restrictions are outlined on AVS. His recommendation was relayed to primary team.

## 2021-07-03 NOTE — Progress Notes (Signed)
PT Cancellation Note  Patient Details Name: [name] MRN: [id number] DOB: Aug 08, 1930   Cancelled Treatment:    Reason Eval/Treat Not Completed: Patient at procedure or test/unavailable. PT to follow up after pt returns from pacemaker placement.   Zenaida Niece 07/03/2021, 12:27 PM

## 2021-07-03 NOTE — TOC Transition Note (Signed)
Transition of Care [facility]) - CM/SW Discharge Note   Patient Details  Name: [name] MRN: [id number] Date of Birth: 26-Apr-1931  [facility] CM/SW Contact:  Konrad Penta, RN Phone Number: [phone] 07/03/2021, 4:26 PM   Clinical Narrative: Spoke with patient's daughter [name]. Mr. Balderson lives with spouse and [name] lives nearby. Discussed there were no therapy recommendations for home health. Cane recommended. Discussed cane will be delivered to room prior to transition home.  Spoke with [name] with [employer] to request cane be delivered to room. Made nursing aware.  No further TOC needs assessed.     Final next level of care: Home/Self Care Barriers to Discharge: No Barriers Identified   Patient Goals and CMS Choice Patient states their goals for this hospitalization and ongoing recovery are:: return home CMS Medicare.gov Compare Post Acute Care list provided to:: Other (Comment Required) (daughter/DPR [name]) Choice offered to / list presented to : Adult Children  Discharge Placement                       Discharge Plan and Services                DME Arranged: [name] DME Agency: [employer] Date DME Agency Contacted: 07/03/21 Time DME Agency Contacted: [phone] Representative spoke with at DME Agency: [name] ([place]) Interventions     Readmission Risk Interventions No flowsheet data found.

## 2021-07-03 NOTE — Progress Notes (Signed)
TOC acknowledges that pt had a cancellation with PT. TOC awaiting recommendations.  TOC team will continue to assist with discharge planning needs.

## 2021-07-03 NOTE — Discharge Instructions (Addendum)
Supplemental Discharge Instructions for  Pacemaker Patients  Activity No heavy lifting or vigorous activity with your left/right arm for 6 to 8 weeks.  Do not raise your left/right arm above your head for one week.  Gradually raise your affected arm as drawn below.               07/07/21                    07/08/21                 07/09/21                 07/10/21  NO DRIVING for 1 week   WOUND CARE Keep the wound area clean and dry.  Do not get this area wet for one week. No showers for one week; you may shower on  07/10/21   . The tape/steri-strips on your wound will fall off; do not pull them off.  No bandage is needed on the site.  DO  NOT apply any creams, oils, or ointments to the wound area. If you notice any drainage or discharge from the wound, any swelling or bruising at the site, or you develop a fever > 101? F after you are discharged home, call the office at once.  Special Instructions You are still able to use cellular telephones; use the ear opposite the side where you have your pacemaker/defibrillator.  Avoid carrying your cellular phone near your device. When traveling through airports, show security personnel your identification card to avoid being screened in the metal detectors.  Ask the security personnel to use the hand wand. Avoid arc welding equipment, MRI testing (magnetic resonance imaging), TENS units (transcutaneous nerve stimulators).  Call the office for questions about other devices. Avoid electrical appliances that are in poor condition or are not properly grounded. Microwave ovens are safe to be near or to operate.  ____________________________________________________________________________________________________________________________  Information on my medicine - Paxlovid (nirmatrelvir/ritonavir)  This medication education was reviewed with me or my healthcare representative as part of my discharge preparation.    Why was PAXLOVID prescribed for  you? It treats mild to moderate COVID-19.  It may help people who are at high risk of developing severe illness.  The medication works by limiting the spread of the virus in your body.  The FDA has allowed emergency use of this medication.  What do You need to know about PAXLOVID ? Take with or without food.  If it upsets your stomach take with food. This drug comes in a blister card that has both the morning and evening dose in it.  Be sure you know how many tablets to take for each dose and which tablets go together.  If you take this drug the wrong way, it may not work as well or you could have more side effects.  Do not take tablets out of the blister card until you are ready to take your dose. Do not take this drug for longer than you were told by your doctor or pharmacist. This drug interacts with many other drugs.  Check with your doctor or pharmacist to make sure that it is safe for you to add new medications including OTC, natural products and vitamins while you are completing your course of Paxlovid. After getting this drug, you must continue to isolate and do other things to prevent spreading infection to others.  Wear a mask, social distance, do not share personal  items, clean and disinfect high touch surfaces, and wash hands often. Tell your doctor if you are pregnant, plan on getting pregnant, or are breast-feeding.  You will need to talk about benefits and risks to you and your baby. Birth control pills and other hormone-based birth control may not work as well to prevent pregnancy.  Use some other kind of birth control also like a condom when taking this drug.  What do you do if you miss a dose? If you miss a dose, take it as soon as you remember unless it is more than 8 hours late.  If it is more than 8 hours late, skip the missed dose.  Take the next dose at the normal time.  DO NOT TAKE EXTRA or 2 doses at the same time to make up the missed dose.  Important Safety Information Side  effects that may happen but usually do not require medical attention - report if they continue:  Change in taste, diarrhea, muscle pain, stomach pain or nausea.  Tell your doctor or get medical help right away if you have any of the following symptoms: Signs of allergic reaction, like rash, hives, itching, red, swollen, blistered, or peeling skin with or without fever, tightness of the chest or throat, trouble breathing. Signs of liver problems like dark urine, tiredness, decreased appetite, light colored stools, throwing up or yellowing of skin or eyes. Sign of high blood pressure like very bad headache or dizziness, passing out, eyesight changes.  This website has more information on Paxlovid (nirmatrelvir & ritonavir) : http://yates.biz/

## 2021-07-03 NOTE — Discharge Summary (Signed)
Physician Discharge Summary  PACER DORN PTW:[id number] DOB: Jan 05, 1931 DOA: 07/02/2021  PCP: Unk Pinto, MD  Admit date: 07/02/2021 Discharge date: 07/03/2021  Admitted From: Home Disposition: Home  Recommendations for Outpatient Follow-up:  Follow up with PCP in 1-2 weeks Please obtain BMP/CBC in one week Please follow up with cardiology tomorrow as scheduled  Home Health: None Equipment/Devices: Cane   Discharge Condition: Stable CODE STATUS: Full Diet recommendation: As tolerated  Brief/Interim Summary: [name] is a 88 y.o. male with medical history significant of BPH, HTN, HLD. Presenting with syncope. History is from his daughter. She reports that he was getting ready to go get food today. She heard his horn in his car beeping. She went outside and found him passed out in the seat. She tried to wake him, but he didn't respond. She pulled him from his car and called EMS. When EMS arrived, he was found to have a BP of 60/40. He was given fluids and returned to his normal mentation. He was brought to the ED for evaluation.    Assessment & Plan:   Syncope multifactorial: Secondary to cardiac pauses as captured by loop recorder Orthostatic hypotension Covid positive status - EP placed pacemaker 07/03/2021, tolerated well - EEG cancelled, patient previously refusing and overt source for syncope noted above -Follow-up with outpatient cardiology -Orthostatics improving after fluid resuscitation   COVID 19 infection -Family noted to be sick, continue paxlovid at discharge for 5-day total course   AKI, resolving -Renal ultrasound unremarkable, tolerating p.o. well, labs downtrending appropriately   Discharge Diagnoses:  Principal Problem:   Syncope Active Problems:   Rheumatoid arthritis ([place])   Essential hypertension   Mixed hyperlipidemia   COVID-19 virus infection   AKI (acute kidney injury) ([place])   Heart block    Discharge Instructions  Discharge  Instructions     Cane   Complete by: As directed       Allergies as of 07/03/2021       Reactions   Ace Inhibitors    Asa [aspirin]    High Dose asprin   Penicillins Hives   Vasotec [enalapril]    Cough   Clindamycin/lincomycin    Hives        Medication List     STOP taking these medications    albuterol 108 (90 Base) MCG/ACT inhaler Commonly known as: Ventolin HFA       TAKE these medications    finasteride 5 MG tablet Commonly known as: PROSCAR Take  1 tablet  Daily  for Prostate   nirmatrelvir/ritonavir EUA (renal dosing) 10 x 150 MG & 10 x 100MG  Tabs Commonly known as: PAXLOVID Take 2 tablets by mouth 2 (two) times daily for 4 days. Patient GFR is 49. Take nirmatrelvir (150 mg) one tablet twice daily for 5 days and ritonavir (100 mg) one tablet twice daily for 5 days.   olmesartan 20 MG tablet Commonly known as: BENICAR Take 1 tablet  Daily  for BP   predniSONE 5 MG tablet Commonly known as: DELTASONE Take 1 tablet 2 x / day or as directed for Asthma & Rheumatoid Arthritis What changed:  how much to take how to take this when to take this additional instructions   rosuvastatin 20 MG tablet Commonly known as: Crestor Take  1 tablet  Daily  for Cholesterol What changed:  how much to take how to take this when to take this additional instructions   terazosin 10 MG capsule Commonly known as: HYTRIN  Take  1 capsule  at Bedtime  for Prostate What changed:  how much to take how to take this when to take this   Vitamin D 125 MCG (5000 UT) Caps Take 5,000 Units by mouth daily.               Durable Medical Equipment  (From admission, onward)           Start     Ordered   07/03/21 1539  For home use only DME Cane  Once        07/03/21 1538            Follow-up Information     [place] [facility] Follow up.   Specialty: Cardiology Why: [facility] - office will call you to arrange a wound check in 10-14  days with our device clinic nurse as well as follow-up with Dr. Quentin Ore in 3 months. Contact information: [address], [address] [place] [address] [phone]               Allergies  Allergen Reactions   Ace Inhibitors    Asa [Aspirin]     High Dose asprin   Penicillins Hives   Vasotec [Enalapril]     Cough   Clindamycin/Lincomycin     Hives     Consultations: Cardiology, EP   Procedures/Studies: DG Knee 1-2 Views Left  Result Date: 07/03/2021 CLINICAL DATA:  Left knee pain EXAM: LEFT KNEE - 1-2 VIEW COMPARISON:  None. FINDINGS: There is no evidence of acute fracture or dislocation. Trace joint effusion. There is tricompartment osteophyte formation and chondrocalcinosis. Suprapatellar enthesophyte formation. IMPRESSION: No acute fracture.  Trace joint effusion. Mild Tricompartment osteoarthritis and/or CPPD arthropathy. Electronically Signed   By: Maurine Simmering M.D.   On: 07/03/2021 09:44   CT HEAD WO CONTRAST (5MM)  Result Date: 07/02/2021 CLINICAL DATA:  Loss of consciousness, hit head EXAM: CT HEAD WITHOUT CONTRAST CT CERVICAL SPINE WITHOUT CONTRAST TECHNIQUE: Multidetector CT imaging of the head and cervical spine was performed following the standard protocol without intravenous contrast. Multiplanar CT image reconstructions of the cervical spine were also generated. RADIATION DOSE REDUCTION: This exam was performed according to the departmental dose-optimization program which includes automated exposure control, adjustment of the mA and/or kV according to patient size and/or use of iterative reconstruction technique. COMPARISON:  03/05/2021 FINDINGS: CT HEAD FINDINGS Brain: No evidence of acute infarction, hemorrhage, hydrocephalus, extra-axial collection or mass lesion/mass effect. Vascular: No hyperdense vessel or unexpected calcification. Skull: Normal. Negative for fracture or focal lesion. Sinuses/Orbits: No acute finding. Other: None. CT  CERVICAL SPINE FINDINGS Alignment: Normal. Skull base and vertebrae: No acute fracture. No primary bone lesion or focal pathologic process. Soft tissues and spinal canal: No prevertebral fluid or swelling. No visible canal hematoma. Disc levels: Focally moderate disc space height loss and osteophytosis of C5 through C7 with otherwise intact disc spaces. Upper chest: Negative. Other: None. IMPRESSION: 1. No acute intracranial pathology. 2. No fracture or static subluxation of the cervical spine. 3. Focally moderate cervical disc degenerative disease of C5 through C7. Electronically Signed   By: Delanna Ahmadi M.D.   On: 07/02/2021 13:19   CT Cervical Spine Wo Contrast  Result Date: 07/02/2021 CLINICAL DATA:  Loss of consciousness, hit head EXAM: CT HEAD WITHOUT CONTRAST CT CERVICAL SPINE WITHOUT CONTRAST TECHNIQUE: Multidetector CT imaging of the head and cervical spine was performed following the standard protocol without intravenous contrast. Multiplanar CT image reconstructions  of the cervical spine were also generated. RADIATION DOSE REDUCTION: This exam was performed according to the departmental dose-optimization program which includes automated exposure control, adjustment of the mA and/or kV according to patient size and/or use of iterative reconstruction technique. COMPARISON:  03/05/2021 FINDINGS: CT HEAD FINDINGS Brain: No evidence of acute infarction, hemorrhage, hydrocephalus, extra-axial collection or mass lesion/mass effect. Vascular: No hyperdense vessel or unexpected calcification. Skull: Normal. Negative for fracture or focal lesion. Sinuses/Orbits: No acute finding. Other: None. CT CERVICAL SPINE FINDINGS Alignment: Normal. Skull base and vertebrae: No acute fracture. No primary bone lesion or focal pathologic process. Soft tissues and spinal canal: No prevertebral fluid or swelling. No visible canal hematoma. Disc levels: Focally moderate disc space height loss and osteophytosis of C5 through  C7 with otherwise intact disc spaces. Upper chest: Negative. Other: None. IMPRESSION: 1. No acute intracranial pathology. 2. No fracture or static subluxation of the cervical spine. 3. Focally moderate cervical disc degenerative disease of C5 through C7. Electronically Signed   By: Delanna Ahmadi M.D.   On: 07/02/2021 13:19   US RENAL  Result Date: 07/02/2021 CLINICAL DATA:  88 year old male with acute kidney injury. EXAM: RENAL / URINARY TRACT ULTRASOUND COMPLETE COMPARISON:  12/24/2008 ultrasound FINDINGS: Right Kidney: Renal measurements: 13.1 x 6.5 x 6.8 cm = volume: 311 mL. Cortical thinning is noted. Echogenicity within normal limits. No mass or hydronephrosis visualized. Left Kidney: Renal measurements: 14.4 x 5.3 x 6.3 cm = volume: 254 mL. Cortical thinning is noted. Echogenicity within normal limits. No mass or hydronephrosis visualized. Bladder: Appears normal for degree of bladder distention. Other: None. IMPRESSION: 1. Unremarkable kidneys except for bilateral renal cortical thinning. No evidence of hydronephrosis. Electronically Signed   By: Margarette Canada M.D.   On: 07/02/2021 19:20   EP PPM/ICD IMPLANT  Addendum Date: 07/03/2021   CONCLUSIONS:  1. Successful implantation of a Medtronic dual-chamber pacemaker for symptomatic bradycardia due to complete heart block  2. No early apparent complications.       Cristopher Peru, MD 07/03/2021 12:58 PM    Result Date: 07/03/2021 CONCLUSIONS:  1. Successful implantation of a Medtronic dual-chamber pacemaker for symptomatic bradycardia due to complete heart block  2. No early apparent complications.       Cristopher Peru, MD 07/03/2021 12:58 PM    DG Chest Portable 1 View  Result Date: 07/02/2021 CLINICAL DATA:  Syncope EXAM: PORTABLE CHEST 1 VIEW COMPARISON:  04/21/2021 FINDINGS: Transverse diameter of heart is slightly increased. There are no signs of pulmonary edema or focal pulmonary consolidation. There is no significant pleural effusion or pneumothorax.  Implantable cardiac monitoring device is noted in the left chest wall. IMPRESSION: No active disease. Electronically Signed   By: Elmer Picker M.D.   On: 07/02/2021 12:38   CUP PACEART REMOTE DEVICE CHECK  Result Date: 06/27/2021 ILR summary report received. Battery status OK. Normal device function. No new symptom, tachy, brady, or pause episodes. No new AF episodes. Monthly summary reports and ROV/PRN    Subjective: No acute issues or events overnight tolerated pacemaker placement well PT evaluating for discharge ongoing PT otherwise denies nausea vomiting diarrhea constipation headache fevers chills chest pain   Discharge Exam: Vitals:   07/03/21 1242 07/03/21 1247  BP: (!) 149/77 (!) 159/86  Pulse: 71 80  Resp: 16 17  Temp:    SpO2: 95% 97%   Vitals:   07/03/21 1232 07/03/21 1237 07/03/21 1242 07/03/21 1247  BP: (!) 145/88 (!) 161/78 (!) 149/77 (!) 159/86  Pulse: 73 71 71 80  Resp: 15 16 16 17   Temp:      TempSrc:      SpO2: 96% 97% 95% 97%  Weight:      Height:        General: Pt is alert, awake, not in acute distress Cardiovascular: RRR, S1/S2 +, no rubs, no gallops Respiratory: CTA bilaterally, no wheezing, no rhonchi Abdominal: Soft, NT, ND, bowel sounds + Extremities: no edema, no cyanosis    The results of significant diagnostics from this hospitalization (including imaging, microbiology, ancillary and laboratory) are listed below for reference.     Microbiology: Recent Results (from the past 240 hour(s))  Resp Panel by RT-PCR (Flu A&B, Covid) Nasopharyngeal Swab     Status: Abnormal   Collection Time: 07/02/21 12:51 PM   Specimen: Nasopharyngeal Swab; Nasopharyngeal(NP) swabs in vial transport medium  Result Value Ref Range Status   SARS Coronavirus 2 by RT PCR POSITIVE (A) NEGATIVE Final    Comment: (NOTE) SARS-CoV-2 target nucleic acids are DETECTED.  The SARS-CoV-2 RNA is generally detectable in upper respiratory specimens during the acute  phase of infection. Positive results are indicative of the presence of the identified virus, but do not rule out bacterial infection or co-infection with other pathogens not detected by the test. Clinical correlation with patient history and other diagnostic information is necessary to determine patient infection status. The expected result is Negative.  Fact Sheet for Patients: EntrepreneurPulse.com.au  Fact Sheet for Healthcare Providers: IncredibleEmployment.be  This test is not yet approved or cleared by the [place] FDA and  has been authorized for detection and/or diagnosis of SARS-CoV-2 by FDA under an Emergency Use Authorization (EUA).  This EUA will remain in effect (meaning this test can be used) for the duration of  the COVID-19 declaration under Section 564(b)(1) of the A ct, 21 U.S.C. section 360bbb-3(b)(1), unless the authorization is terminated or revoked sooner.     Influenza A by PCR NEGATIVE NEGATIVE Final   Influenza B by PCR NEGATIVE NEGATIVE Final    Comment: (NOTE) The Xpert Xpress SARS-CoV-2/FLU/RSV plus assay is intended as an aid in the diagnosis of influenza from Nasopharyngeal swab specimens and should not be used as a sole basis for treatment. Nasal washings and aspirates are unacceptable for Xpert Xpress SARS-CoV-2/FLU/RSV testing.  Fact Sheet for Patients: EntrepreneurPulse.com.au  Fact Sheet for Healthcare Providers: IncredibleEmployment.be  This test is not yet approved or cleared by the [place] FDA and has been authorized for detection and/or diagnosis of SARS-CoV-2 by FDA under an Emergency Use Authorization (EUA). This EUA will remain in effect (meaning this test can be used) for the duration of the COVID-19 declaration under Section 564(b)(1) of the Act, 21 U.S.C. section 360bbb-3(b)(1), unless the authorization is terminated or revoked.  Performed at  [facility], [place] [address], [place], [place] [address]      Labs: BNP (last 3 results) Recent Labs    07/02/21 1218  BNP 456.2*   Basic Metabolic Panel: Recent Labs  Lab 07/02/21 1215 07/02/21 1827 07/03/21 0323  NA 136  --  136  K 3.6  --  4.0  CL 104  --  102  CO2 26  --  25  GLUCOSE 137*  --  110*  BUN 19  --  19  CREATININE 1.62* 1.47* 1.38*  CALCIUM 8.5*  --  8.3*  MG 2.0  --   --    Liver Function Tests: Recent Labs  Lab  07/02/21 1215 07/03/21 0323  AST 22 26  ALT 18 16  ALKPHOS 56 53  BILITOT 0.7 1.0  PROT 6.4* 5.2*  ALBUMIN 3.5 2.8*   No results for input(s): LIPASE, AMYLASE in the last 168 hours. No results for input(s): AMMONIA in the last 168 hours. CBC: Recent Labs  Lab 07/02/21 1215 07/02/21 1827 07/03/21 0323  WBC 11.1* 10.1 8.5  NEUTROABS 9.9*  --  5.9  HGB 13.2 12.7* 12.5*  HCT 39.8 38.5* 36.5*  MCV 92.6 92.5 92.2  PLT 173 192 175   Cardiac Enzymes: No results for input(s): CKTOTAL, CKMB, CKMBINDEX, TROPONINI in the last 168 hours. BNP: Invalid input(s): POCBNP CBG: No results for input(s): GLUCAP in the last 168 hours. D-Dimer Recent Labs    07/03/21 0323  DDIMER 1.19*   Hgb A1c No results for input(s): HGBA1C in the last 72 hours. Lipid Profile No results for input(s): CHOL, HDL, LDLCALC, TRIG, CHOLHDL, LDLDIRECT in the last 72 hours. Thyroid function studies Recent Labs    07/02/21 1827  TSH 0.741   Anemia work up Recent Labs    07/03/21 0323  FERRITIN 166   Urinalysis    Component Value Date/Time   COLORURINE STRAW (A) 07/03/2021 0745   APPEARANCEUR CLEAR 07/03/2021 0745   LABSPEC 1.008 07/03/2021 0745   PHURINE 6.0 07/03/2021 0745   GLUCOSEU NEGATIVE 07/03/2021 0745   HGBUR NEGATIVE 07/03/2021 0745   BILIRUBINUR NEGATIVE 07/03/2021 0745   KETONESUR NEGATIVE 07/03/2021 0745   PROTEINUR NEGATIVE 07/03/2021 0745   UROBILINOGEN 0.2 12/27/2008 1005   NITRITE NEGATIVE 07/03/2021 0745    LEUKOCYTESUR NEGATIVE 07/03/2021 0745   Sepsis Labs Invalid input(s): PROCALCITONIN,  WBC,  LACTICIDVEN Microbiology Recent Results (from the past 240 hour(s))  Resp Panel by RT-PCR (Flu A&B, Covid) Nasopharyngeal Swab     Status: Abnormal   Collection Time: 07/02/21 12:51 PM   Specimen: Nasopharyngeal Swab; Nasopharyngeal(NP) swabs in vial transport medium  Result Value Ref Range Status   SARS Coronavirus 2 by RT PCR POSITIVE (A) NEGATIVE Final    Comment: (NOTE) SARS-CoV-2 target nucleic acids are DETECTED.  The SARS-CoV-2 RNA is generally detectable in upper respiratory specimens during the acute phase of infection. Positive results are indicative of the presence of the identified virus, but do not rule out bacterial infection or co-infection with other pathogens not detected by the test. Clinical correlation with patient history and other diagnostic information is necessary to determine patient infection status. The expected result is Negative.  Fact Sheet for Patients: EntrepreneurPulse.com.au  Fact Sheet for Healthcare Providers: IncredibleEmployment.be  This test is not yet approved or cleared by the [place] FDA and  has been authorized for detection and/or diagnosis of SARS-CoV-2 by FDA under an Emergency Use Authorization (EUA).  This EUA will remain in effect (meaning this test can be used) for the duration of  the COVID-19 declaration under Section 564(b)(1) of the A ct, 21 U.S.C. section 360bbb-3(b)(1), unless the authorization is terminated or revoked sooner.     Influenza A by PCR NEGATIVE NEGATIVE Final   Influenza B by PCR NEGATIVE NEGATIVE Final    Comment: (NOTE) The Xpert Xpress SARS-CoV-2/FLU/RSV plus assay is intended as an aid in the diagnosis of influenza from Nasopharyngeal swab specimens and should not be used as a sole basis for treatment. Nasal washings and aspirates are unacceptable for Xpert Xpress  SARS-CoV-2/FLU/RSV testing.  Fact Sheet for Patients: EntrepreneurPulse.com.au  Fact Sheet for Healthcare Providers: IncredibleEmployment.be  This test is not yet  approved or cleared by the [place] and has been authorized for detection and/or diagnosis of SARS-CoV-2 by FDA under an Emergency Use Authorization (EUA). This EUA will remain in effect (meaning this test can be used) for the duration of the COVID-19 declaration under Section 564(b)(1) of the Act, 21 U.S.C. section 360bbb-3(b)(1), unless the authorization is terminated or revoked.  Performed at [facility], [place] [address] [place], [place] [address]      Time coordinating discharge: Over 30 minutes  SIGNED:   Little Ishikawa, DO [facility] 07/03/2021, 3:38 PM Pager   If 7PM-7AM, please contact night-coverage [email or web address]

## 2021-07-03 NOTE — Interval H&P Note (Signed)
History and Physical Interval Note:  07/03/2021 11:20 AM  [name]  has presented today for surgery, with the diagnosis of bradycardia.  The various methods of treatment have been discussed with the patient and family. After consideration of risks, benefits and other options for treatment, the patient has consented to  Procedure(s): PACEMAKER IMPLANT (N/A) as a surgical intervention.  The patient's history has been reviewed, patient examined, no change in status, stable for surgery.  I have reviewed the patient's chart and labs.  Questions were answered to the patient's satisfaction.     Cristopher Peru

## 2021-07-03 NOTE — H&P (View-Only) (Signed)
Cardiology Consultation:   Patient ID: [name] MRN: [id number]; DOB: 1930-10-18  Admit date: 07/02/2021 Date of Consult: 07/03/2021  PCP:  Unk Pinto, MD   [facility] Providers Cardiologist:  None  Electrophysiologist:  Vickie Epley, MD       Patient Profile:   [name] is a 88 y.o. male with a hx of syncope who is being seen 07/03/2021 for the evaluation of recurrent syncope at the request of Dr. Quentin Ore.  History of Present Illness:   [name] has a h/o syncope and bifascicular block who presented with syncope and underwent ILR insertion several months ago. He was in his usual state of health yesterday when he passed out. No warning. He was out for over 10 seconds. He went to the ED where he was found to have Covid. His ILR was interrogated and he had asystole that correlated with the spell and he is admitted in transfer to consider PPM insertion. He has not had fever. His covid symptoms are a cough with congestion.    Past Medical History:  Diagnosis Date   Allergy    Asthma    BPH (benign prostatic hypertrophy)    BPH (benign prostatic hypertrophy)    COPD (chronic obstructive pulmonary disease) (HCC)    DJD (degenerative joint disease)    GERD (gastroesophageal reflux disease)    History of urinary retention    Hypertension    IBS (irritable bowel syndrome)    RBBB    Rheumatoid arthritis(714.0)    DR. HAWKES   Scrotal lesion    Short of breath on exertion     Past Surgical History:  Procedure Laterality Date   CARDIOVASCULAR STRESS TEST  04-26-2011  DR BRACKBILL   LOW RISK STRESS NUCLEAR STUDY/ NO EVIDENCE ISCHEMIA   LOOP RECORDER INSERTION N/A 03/07/2021   Procedure: LOOP RECORDER INSERTION;  Surgeon: Vickie Epley, MD;  Location: [facility];  Service: Cardiovascular;  Laterality: N/A;   LUMBAR FUSION  X3  LAST ONE 1996   SCROTAL EXPLORATION Left 07/17/2012   Procedure:  EXCISION  OF LEFT SCROTAL SKIN LESION;  Surgeon: Molli Hazard, MD;  Location: [facility];  Service: Urology;  Laterality: Left;   TRANSTHORACIC ECHOCARDIOGRAM  04-26-2011     MILD LVH/ LVSF NORMAL/ EF 79-02%/ GRADE I DIASTOLIC DYSFUNCTION/ MILD MITRAL REGURG.   TRANSURETHRAL RESECTION OF PROSTATE  SEVERAL YRS AGO     Home Medications:  Prior to Admission medications   Medication Sig Start Date End Date Taking? Authorizing Provider  Cholecalciferol (VITAMIN D) 125 MCG (5000 UT) CAPS Take 5,000 Units by mouth daily.   Yes [provider]  finasteride (PROSCAR) 5 MG tablet Take  1 tablet  Daily  for Prostate 06/06/21  Yes Unk Pinto, MD  olmesartan (BENICAR) 20 MG tablet Take 1 tablet  Daily  for BP 06/06/21  Yes Unk Pinto, MD  predniSONE (DELTASONE) 5 MG tablet Take 1 tablet 2 x / day or as directed for Asthma & Rheumatoid Arthritis Patient taking differently: Take 10 mg by mouth daily. 01/25/21  Yes Unk Pinto, MD  rosuvastatin (CRESTOR) 20 MG tablet Take  1 tablet  Daily  for Cholesterol Patient taking differently: Take 20 mg by mouth daily. 08/20/20  Yes Unk Pinto, MD  terazosin (HYTRIN) 10 MG capsule Take  1 capsule  at Bedtime  for Prostate Patient taking differently: Take 10 mg by mouth at bedtime. Take  1 capsule  at Bedtime  for Prostate 06/06/21  Yes Unk Pinto, MD  albuterol (VENTOLIN HFA) 108 (90 Base) MCG/ACT inhaler Inhale 1-2 puffs into the lungs every 4 (four) hours as needed for wheezing or shortness of breath. Patient not taking: Reported on 07/02/2021 04/20/21   Liane Comber, NP    Inpatient Medications: Scheduled Meds:  vitamin C  500 mg Oral Daily   cholecalciferol  5,000 Units Oral Daily   enoxaparin (LOVENOX) injection  40 mg Subcutaneous Q24H   finasteride  5 mg Oral Daily   nirmatrelvir/ritonavir EUA (renal dosing)  2 tablet Oral BID   predniSONE  10 mg Oral Daily   rosuvastatin  20 mg Oral Daily   sodium chloride flush  3 mL Intravenous Q12H   zinc sulfate   220 mg Oral Daily   Continuous Infusions:  lactated ringers 125 mL/hr at 07/03/21 0817   PRN Meds: acetaminophen **OR** acetaminophen, chlorpheniramine-HYDROcodone, guaiFENesin-dextromethorphan  Allergies:    Allergies  Allergen Reactions   Ace Inhibitors    Asa [Aspirin]     High Dose asprin   Penicillins Hives   Vasotec [Enalapril]     Cough   Clindamycin/Lincomycin     Hives     Social History:   Social History   Socioeconomic History   Marital status: Married    Spouse name: Not on file   Number of children: Not on file   Years of education: Not on file   Highest education level: Not on file  Occupational History   Occupation: RETIRED    Employer: RETIRED  Tobacco Use   Smoking status: Former    Packs/day: 1.00    Years: 30.00    Pack years: 30.00    Types: Cigarettes    Quit date: 07/11/1980    Years since quitting: 41.0   Smokeless tobacco: Current    Types: Chew  Vaping Use   Vaping Use: Never used  Substance and Sexual Activity   Alcohol use: No   Drug use: No   Sexual activity: Not on file  Other Topics Concern   Not on file  Social History Narrative   Not on file   Social Determinants of Health   Financial Resource Strain: Not on file  Food Insecurity: Not on file  Transportation Needs: Not on file  Physical Activity: Not on file  Stress: Not on file  Social Connections: Not on file  Intimate Partner Violence: Not on file    Family History:    Family History  Problem Relation Age of Onset   Heart disease Father    Heart disease Brother    Colon cancer Brother 81     ROS:  Please see the history of present illness.   All other ROS reviewed and negative.     Physical Exam/Data:   Vitals:   07/02/21 2335 07/03/21 0621 07/03/21 0626 07/03/21 0908  BP: 120/60 115/61 120/65 116/70  Pulse: 86 (!) 32 71 65  Resp:      Temp: 99.3 F (37.4 C) 98.9 F (37.2 C)  98.3 F (36.8 C)  TempSrc: Oral Oral  Oral  SpO2: 91% 91% 94% 97%   Weight:  126.9 kg    Height:        Intake/Output Summary (Last 24 hours) at 07/03/2021 1003 Last data filed at 07/03/2021 0736 Gross per 24 hour  Intake 500 ml  Output 1150 ml  Net -650 ml   Last 3 Weights 07/03/2021 04/21/2021 04/20/2021  Weight (lbs) 279 lb 12.2 oz 278 lb  12.8 oz 279 lb 12.8 oz  Weight (kg) 126.9 kg 126.463 kg 126.916 kg     Body mass index is 33.18 kg/m.  General:  elderly, Well nourished, well developed, in no acute distress HEENT: normal Neck: no JVD Vascular: No carotid bruits; Distal pulses 2+ bilaterally Cardiac:  normal S1, S2; RRR; no murmur  Lungs:  clear to auscultation bilaterally, no wheezing, rhonchi or rales  Abd: soft, nontender, no hepatomegaly  Ext: no edema Musculoskeletal:  No deformities, BUE and BLE strength normal and equal Skin: warm and dry  Neuro:  CNs 2-12 intact, no focal abnormalities noted Psych:  Normal affect   EKG:  The EKG was personally reviewed and demonstrates:  NSR with RBBB and first degree AV block Telemetry:  Telemetry was personally reviewed and demonstrates:  NSR  Relevant CV Studies: none  Laboratory Data:  High Sensitivity Troponin:   Recent Labs  Lab 07/02/21 1215 07/02/21 1524  TROPONINIHS 15 11     Chemistry Recent Labs  Lab 07/02/21 1215 07/02/21 1827 07/03/21 0323  NA 136  --  136  K 3.6  --  4.0  CL 104  --  102  CO2 26  --  25  GLUCOSE 137*  --  110*  BUN 19  --  19  CREATININE 1.62* 1.47* 1.38*  CALCIUM 8.5*  --  8.3*  MG 2.0  --   --   GFRNONAA 41* 46* 49*  ANIONGAP 6  --  9    Recent Labs  Lab 07/02/21 1215 07/03/21 0323  PROT 6.4* 5.2*  ALBUMIN 3.5 2.8*  AST 22 26  ALT 18 16  ALKPHOS 56 53  BILITOT 0.7 1.0   Lipids No results for input(s): CHOL, TRIG, HDL, LABVLDL, LDLCALC, CHOLHDL in the last 168 hours.  Hematology Recent Labs  Lab 07/02/21 1215 07/02/21 1827 07/03/21 0323  WBC 11.1* 10.1 8.5  RBC 4.30 4.16* 3.96*  HGB 13.2 12.7* 12.5*  HCT 39.8 38.5* 36.5*   MCV 92.6 92.5 92.2  MCH 30.7 30.5 31.6  MCHC 33.2 33.0 34.2  RDW 13.1 13.0 13.2  PLT 173 192 175   Thyroid  Recent Labs  Lab 07/02/21 1827  TSH 0.741    BNP Recent Labs  Lab 07/02/21 1218  BNP 143.8*    DDimer  Recent Labs  Lab 07/03/21 0323  DDIMER 1.19*     Radiology/Studies:  DG Knee 1-2 Views Left  Result Date: 07/03/2021 CLINICAL DATA:  Left knee pain EXAM: LEFT KNEE - 1-2 VIEW COMPARISON:  None. FINDINGS: There is no evidence of acute fracture or dislocation. Trace joint effusion. There is tricompartment osteophyte formation and chondrocalcinosis. Suprapatellar enthesophyte formation. IMPRESSION: No acute fracture.  Trace joint effusion. Mild Tricompartment osteoarthritis and/or CPPD arthropathy. Electronically Signed   By: Maurine Simmering M.D.   On: 07/03/2021 09:44   CT HEAD WO CONTRAST (5MM)  Result Date: 07/02/2021 CLINICAL DATA:  Loss of consciousness, hit head EXAM: CT HEAD WITHOUT CONTRAST CT CERVICAL SPINE WITHOUT CONTRAST TECHNIQUE: Multidetector CT imaging of the head and cervical spine was performed following the standard protocol without intravenous contrast. Multiplanar CT image reconstructions of the cervical spine were also generated. RADIATION DOSE REDUCTION: This exam was performed according to the departmental dose-optimization program which includes automated exposure control, adjustment of the mA and/or kV according to patient size and/or use of iterative reconstruction technique. COMPARISON:  03/05/2021 FINDINGS: CT HEAD FINDINGS Brain: No evidence of acute infarction, hemorrhage, hydrocephalus, extra-axial collection or mass lesion/mass  effect. Vascular: No hyperdense vessel or unexpected calcification. Skull: Normal. Negative for fracture or focal lesion. Sinuses/Orbits: No acute finding. Other: None. CT CERVICAL SPINE FINDINGS Alignment: Normal. Skull base and vertebrae: No acute fracture. No primary bone lesion or focal pathologic process. Soft tissues  and spinal canal: No prevertebral fluid or swelling. No visible canal hematoma. Disc levels: Focally moderate disc space height loss and osteophytosis of C5 through C7 with otherwise intact disc spaces. Upper chest: Negative. Other: None. IMPRESSION: 1. No acute intracranial pathology. 2. No fracture or static subluxation of the cervical spine. 3. Focally moderate cervical disc degenerative disease of C5 through C7. Electronically Signed   By: Delanna Ahmadi M.D.   On: 07/02/2021 13:19   CT Cervical Spine Wo Contrast  Result Date: 07/02/2021 CLINICAL DATA:  Loss of consciousness, hit head EXAM: CT HEAD WITHOUT CONTRAST CT CERVICAL SPINE WITHOUT CONTRAST TECHNIQUE: Multidetector CT imaging of the head and cervical spine was performed following the standard protocol without intravenous contrast. Multiplanar CT image reconstructions of the cervical spine were also generated. RADIATION DOSE REDUCTION: This exam was performed according to the departmental dose-optimization program which includes automated exposure control, adjustment of the mA and/or kV according to patient size and/or use of iterative reconstruction technique. COMPARISON:  03/05/2021 FINDINGS: CT HEAD FINDINGS Brain: No evidence of acute infarction, hemorrhage, hydrocephalus, extra-axial collection or mass lesion/mass effect. Vascular: No hyperdense vessel or unexpected calcification. Skull: Normal. Negative for fracture or focal lesion. Sinuses/Orbits: No acute finding. Other: None. CT CERVICAL SPINE FINDINGS Alignment: Normal. Skull base and vertebrae: No acute fracture. No primary bone lesion or focal pathologic process. Soft tissues and spinal canal: No prevertebral fluid or swelling. No visible canal hematoma. Disc levels: Focally moderate disc space height loss and osteophytosis of C5 through C7 with otherwise intact disc spaces. Upper chest: Negative. Other: None. IMPRESSION: 1. No acute intracranial pathology. 2. No fracture or static  subluxation of the cervical spine. 3. Focally moderate cervical disc degenerative disease of C5 through C7. Electronically Signed   By: Delanna Ahmadi M.D.   On: 07/02/2021 13:19   US RENAL  Result Date: 07/02/2021 CLINICAL DATA:  88 year old male with acute kidney injury. EXAM: RENAL / URINARY TRACT ULTRASOUND COMPLETE COMPARISON:  12/24/2008 ultrasound FINDINGS: Right Kidney: Renal measurements: 13.1 x 6.5 x 6.8 cm = volume: 311 mL. Cortical thinning is noted. Echogenicity within normal limits. No mass or hydronephrosis visualized. Left Kidney: Renal measurements: 14.4 x 5.3 x 6.3 cm = volume: 254 mL. Cortical thinning is noted. Echogenicity within normal limits. No mass or hydronephrosis visualized. Bladder: Appears normal for degree of bladder distention. Other: None. IMPRESSION: 1. Unremarkable kidneys except for bilateral renal cortical thinning. No evidence of hydronephrosis. Electronically Signed   By: Margarette Canada M.D.   On: 07/02/2021 19:20   DG Chest Portable 1 View  Result Date: 07/02/2021 CLINICAL DATA:  Syncope EXAM: PORTABLE CHEST 1 VIEW COMPARISON:  04/21/2021 FINDINGS: Transverse diameter of heart is slightly increased. There are no signs of pulmonary edema or focal pulmonary consolidation. There is no significant pleural effusion or pneumothorax. Implantable cardiac monitoring device is noted in the left chest wall. IMPRESSION: No active disease. Electronically Signed   By: Elmer Picker M.D.   On: 07/02/2021 12:38     Assessment and Plan:   Stokes Adams syncope - he has baseline bifascicular block and passed out yesterday with over 10 seconds of asystole recorded on his ILR. I have discussed the indications/risks/benefits/goals/expectations of PPM insertion and  he wishes to proceed. HTN - after his PPM insertion he could take a beta blocker if needed.    Risk Assessment/Risk Scores:        For questions or updates, please contact [place] Please consult [email or web address]  for contact info under    Signed, Cristopher Peru, MD  07/03/2021 10:03 AM

## 2021-07-03 NOTE — Progress Notes (Signed)
*  PRELIMINARY RESULTS* Echocardiogram 2D Echocardiogram has been performed with Definity.  Samuel Germany 07/03/2021, 4:45 PM

## 2021-07-04 ENCOUNTER — Telehealth: Payer: Self-pay

## 2021-07-04 ENCOUNTER — Encounter (HOSPITAL_COMMUNITY): Payer: Self-pay | Admitting: Internal Medicine

## 2021-07-04 NOTE — Telephone Encounter (Signed)
-----   Message from Shirley Friar, PA-C sent at 07/04/2021  8:06 AM EST -----  This patient was discharged several hours after PPM yesterday, No follow up, not even clear if his instructions were in AVS or given to him verbally.  Please check on as soon as you can this am.  Thanks!  Legrand Como [address] [place], PA-C  07/04/2021 8:07 AM

## 2021-07-04 NOTE — Telephone Encounter (Signed)
Follow-up after same day discharge: Implant date: 07/03/21 MD: Cristopher Peru, MD for Dr. Quentin Ore Device: Medronic dual PPM, appears to have been implanted emergently by Dr. Lovena Le Location: Left pectoral region   Wound check visit: scheduled for 07/14/21 11:20am 90 day MD follow-up: Scheduled for 10/04/21 2:15pm  Remote Transmission received:No, pt seemed overwhelmed during call.  Verified data is in [facility], if remote transmission is not received before wound check will have to trouble shoot at that time.    Dressing removed: Not yet.   Education provided to remove outer dressing today, leave steri-strips intact, keep dry until wound check.    Reviewed activity restrictions with patient.

## 2021-07-07 ENCOUNTER — Telehealth: Payer: Self-pay | Admitting: Internal Medicine

## 2021-07-07 NOTE — Progress Notes (Signed)
°  Chronic Care Management   Note  07/07/2021 Name: [name] MRN: [id number] DOB: 03/27/1931  [name] is a 88 y.o. year old male who is a primary care patient of Unk Pinto, MD. I reached out to [name] by phone today in response to a referral sent by [name]'s PCP, Unk Pinto, MD.   [name] was given information about Chronic Care Management services today including:  CCM service includes personalized support from designated clinical staff supervised by his physician, including individualized plan of care and coordination with other care providers 24/7 contact phone numbers for assistance for urgent and routine care needs. Service will only be billed when office clinical staff spend 20 minutes or more in a month to coordinate care. Only one practitioner may furnish and bill the service in a calendar month. The patient may stop CCM services at any time (effective at the end of the month) by phone call to the office staff.   Patient agreed to services and verbal consent obtained.   Follow up plan: PT IS SCHEDULED FOR INITIAL OUTREACH 07/11/2021@10 :30AM TELEVISIT   Tatjana Dellinger Upstream Scheduler

## 2021-07-08 ENCOUNTER — Telehealth: Payer: Self-pay

## 2021-07-08 NOTE — Telephone Encounter (Signed)
07/08/21-Called patient and confirmed upcoming visit w/ CPP and went over pre-call questions w/ patient.  Total time spent: [place], [facility]

## 2021-07-11 ENCOUNTER — Encounter: Payer: Self-pay | Admitting: Cardiology

## 2021-07-11 ENCOUNTER — Ambulatory Visit: Payer: PPO | Admitting: Pharmacist

## 2021-07-11 ENCOUNTER — Other Ambulatory Visit: Payer: Self-pay

## 2021-07-11 DIAGNOSIS — R7309 Other abnormal glucose: Secondary | ICD-10-CM

## 2021-07-11 DIAGNOSIS — J45909 Unspecified asthma, uncomplicated: Secondary | ICD-10-CM

## 2021-07-11 DIAGNOSIS — N179 Acute kidney failure, unspecified: Secondary | ICD-10-CM

## 2021-07-11 DIAGNOSIS — E782 Mixed hyperlipidemia: Secondary | ICD-10-CM

## 2021-07-11 DIAGNOSIS — J449 Chronic obstructive pulmonary disease, unspecified: Secondary | ICD-10-CM

## 2021-07-11 DIAGNOSIS — M0579 Rheumatoid arthritis with rheumatoid factor of multiple sites without organ or systems involvement: Secondary | ICD-10-CM

## 2021-07-11 DIAGNOSIS — I10 Essential (primary) hypertension: Secondary | ICD-10-CM

## 2021-07-11 DIAGNOSIS — Z79899 Other long term (current) drug therapy: Secondary | ICD-10-CM

## 2021-07-12 DIAGNOSIS — E782 Mixed hyperlipidemia: Secondary | ICD-10-CM | POA: Diagnosis not present

## 2021-07-12 DIAGNOSIS — N179 Acute kidney failure, unspecified: Secondary | ICD-10-CM | POA: Diagnosis not present

## 2021-07-12 DIAGNOSIS — I10 Essential (primary) hypertension: Secondary | ICD-10-CM | POA: Diagnosis not present

## 2021-07-12 NOTE — Progress Notes (Addendum)
Pharmacist Visit  [name], [name] [id number] 69 years, Male  DOB: 14-Dec-1930  M: [phone] Care Team: Otto Herb  __________________________________________________ Clinical Summary Patient Risk: High Next CCM Follow Up: 11/22/21 Next AWV: 10/25/21 Next PCP Visit: 07/13/21 Summary for PCP: [name] is a nice 88 year old male presenting for CCM initial visit. Pt had a recent hospitalization due to fainting. Pt also had presented with an AKI and a BP of 60/40 mmHg. Pt states he is doing better now. Discussed importance of increasing water intake with pt to prevent dizziness. Also discussed that pt should take Olmesartan and Terazosin at night to decrease risk of dizziness and low BP throughout the day. Asked pt to check BP daily at home for the next 2 weeks. Will call back and assess BP readings. If low would recommend decreasing Olmesartan dose to 10mg  daily.  Pt is not taking any inhalers for COPD/Asthma. Patient did not see a difference in his SOB when taking inhalers versus not, so pt d/c'd. However, CAT assessment score was 19 and pt would benefit from therapy. Pt on long-term prednisone use for Asthma and RA, Discussed long term impacts of prednisone use. Consider follow-up with rheumatologist if pt agreeable. I will call this patient back in 1 week to assess asthma control, his goals for symptom control with asthma and COPD, willingness to try another inhaler, RA and symptoms.  With age, long term steroid use, physical inactivity, and former smoker, would recommend pt have DEXA scan  Recommend sending new Rx for Rosuvastatin 20mg  3 times a week to help with adherence  Attestation Statement:: CCM Services:  This encounter meets complex CCM services and moderate to high medical decision making.  Prior to outreach and patient consent for Chronic Care Management, I referred this patient for services after reviewing the nominated patient list or from a personal encounter  with the patient.  I have personally reviewed this encounter including the documentation in this note and have collaborated with the care management provider regarding care management and care coordination activities to include development and update of the comprehensive care plan I am certifying that I agree with the content of this note and encounter as supervising physician.  Chronic Conditions Patient's Chronic Conditions: Hypertension (HTN), Chronic Obstructive Pulmonary Disease (COPD), Asthma, Gastroesophageal Reflux Disease (GERD), Hyperlipidemia/Dyslipidemia (HLD), Benign Prostatic Hyperplasia (BPH), Diabetes (DM), Vitamin D deficiency, Prediabetes, RA, IBS, DJD, Aortic Atherosclerosis, Bradycardia, B12 deficiency, Heart Block  Disease Assessments Current BP: 130/70 Current HR: 91 taken on: 04/21/2021 Previous BP: 112/60 Previous HR: 89 taken on: 04/20/2021 Weight: 278 BMI: 33.06 Last GFR: 49 taken on: 07/03/2021 Why did the patient present?: CCM Initial Visit Marital status?: Married What does the patient do during the day?: Watches TV  Lifestyle habits such as diet and exercise?: Exercise: No activity Diet: Light breakfast: egg biscuit. Light lunch and a Big dinner: usually a 4 course meal Water: 1 bottle - 16 ounces Coffee: once in a while. Alcohol, tobacco, and illicit drug usage?: Alcohol: None Tobacco: None Illicit Drug Use: None What is the patient's sleep pattern?: Trouble staying asleep, Other (with free form text) Details: Wakes up randomly at night How many hours per night does patient typically sleep?: 5 hours Patient pleased with health care they are receiving?: Yes Family, occupational, and living circumstances relevant to overall health?: His family history includes Colon cancer (age of onset: 72) in his brother; Heart disease in his brother and father. Name and location of  Current pharmacy[facility] Current Rx insurance plan: H[employer]  Hypertension  (HTN) Assess this condition today?: Yes Is patient able to obtain BP reading today?: No Goal: <130/80 mmHG Hypertension Stage: Stage 1 (SBP: 130-139 or DBP: 80-89) Is Patient checking BP at home?: No How often does patient miss taking their blood pressure medications?: None Has patient experienced hypotension, dizziness, falls or bradycardia?: Yes Provide Details: dizziness attimes. random, after taking his medications Check present secondary causes (below) for HTN: Obesity, CKD We discussed: Other (provide details below), DASH diet:  following a diet emphasizing fruits and vegetables and low-fat dairy products along with whole grains, fish, poultry, and nuts. Reducing red meats and sugars. Details: Encouraged patient to increase physical activity by participatin in the silver sneakers program live workout videos. Counseled patient on the importance of taking Terazosin at night to help decrease orthostatic hypotension. Recommended that patient start taking Olmesartan at night Assessment:: Controlled Drug: olmesartan (BENICAR) 20 MG tablet Take 1 tablet Daily for BP Assessment: Appropriate, Effective, Query Safety Plan to (other): Asked patient to check BP daily and CCM team will follow-up with patient in 2 weeks to assess BP readings. If BP low, consider decreasing Olmesartan dose to 10mg  nightly Pharmacist Follow up: Review home BP readings, Assess for hypotension, falls  Hyperlipidemia/Dyslipidemia (HLD) Last Lipid panel on: 04/20/2021 TC (Goal<200): 106 LDL: 48 HDL (Goal>40): 42 TG (Goal<150): 77 ASCVD 10-year risk?is:: N/A due to Age > 79 Assess this condition today?: Yes LDL Goal: <70 Check present secondary causes (below) that can lead to increased cholesterol levels (multi-choice optional): CKD We discussed: How a diet high in plant sterols (fruits/vegetables/nuts/whole grains/legumes) may reduce your cholesterol., Encouraged increasing fiber to a daily intake of 10-25g/day, How to  reduce cholesterol through diet/weight management and physical activity. Assessment:: Controlled Drug: Rosuvastatin 20 mg 3 times a week Assessment: Appropriate, Effective, Safe, Accessible Pharmacist Follow up: Will reach out to provider to Rx directions for Rosuvastatin 20mg  to 3 times weekly to aid with pt adherence Lipid panel, LFTs, S/s rhabdo  Diabetes (DM) Current A1C: 5.7 taken on: 03/04/2021 Previous A1C: 5.7 taken on: 09/27/2020 Type: Pre-Diabetes/Impaired Fasting Glucose Assess this condition today?: Yes Goal A1C: < 6.5 % Type: Pre-Diabetes/Impaired Fasting Glucose We discussed: Low carbohydrate eating plan with an emphasis on whole grains, legumes, nuts, fruits, and vegetables and minimal refined and processed foods., Other Details: Be mindfule of the carb content of foods that you are eating such as rice, pasta, potatoes, green beans, corn, beans Assessment:: Controlled Drug: None Pharmacist Follow up: Asses A1c and FBG  Chronic Obstructive Pulmonary Disease (COPD) Current FEV1/FVC: 75 Current FEV1-Pred: 94% taken on: 10/22/2019 Current Eosinophils: 0 taken on: 07/03/2021 CAT Score: 19 taken on: 07/11/2021 Assess this condition today?: Yes Gold grade: 1 (FEV1 > 80 %) Gold group: B (high sx, < 2 exacerbations / yr) Exacerbations in past year with hospitalization: No Is patient currently Smoking or Vaping?: No Did patient smoke/vape before?: Yes Home oxygen therapy: No Frequency of SABA/SAMA use: Never Assessment:: Uncontrolled Drug: predniSONE (DELTASONE) 5 MG tablet Take 1 tablet 2 x / day or as directed for Asthma & Rheumatoid Arthritis Assessment: Appropriate, Effective, Query Safety Additional Info: Pt has failed Symbicort and stiolto per pulmonologist; Pt was given 1 week breztri sample to try - he stated like he felt like he did not need it. He stated he did not see a difference in his SOB Albuterol was discontinued after recent hospital  discharge Pharmacist Follow up: Quick pt call  in 1 week to discuss pt goals of treatment for SOB, if patient is following with pulmonologist, interest in nebulizer therapy or trying another inhaler. Will emphasize, pt may not feel difference immediately but will help pt long term to slow progression of disease. Pt counseling on prednisone side effects especially with long term use. Pt counseling on RA symptoms , if pt seeing a rheumatologist and interested in a rheumatologist referral?  Asthma Pre-bronchodilator FEV1: 2.67 Post-bronchodilator FEV1: 3.10 taken on: 10/22/2019 Current Eosinophils: 0 taken on: 07/03/2021 Assess this condition today?: Yes Asthma Symptom Classification: Moderate Persistent Is patient currently Smoking or Vaping?: No Frequency of SABA use: Never Comorbidities present that may worsen asthma control: GERD, Obesity We discussed: Other Patient has tried and failed: Albuterol, Symbicort, Breztri Albuterol D/c'd when patient d/c'd from hospital  Pharmacist Follow up: Quick pt call in 1 week to discuss Asthma symptoms and patient goals of treatment  Exercise, Diet and Non-Drug Coordination Needs Additional exercise counseling points. We discussed: decreasing sedentary behavior, encouraging participation in insurance-covered exercise program through local gym (i.e. [employer]ne[employer]) Additional diet counseling points. We discussed: Other Details: Enouraged pt to slowly increase water intake to decrease risk of dizziness, help with kidneys, and overall health Discussed Non-Drug Care Coordination Needs: Yes Does Patient have Medication financial barriers?: No  Accountable Health Communities Health-Related Social Needs Screening Tool -  SDOH  ([email or web address])  What is your living situation today? (ref #1): I have a steady place to live Think about the place you live. Do you have problems with any of the following?  (ref #2): None of the above Within the past 12 months, you worried that your food would run out before you got money to buy more (ref #3): Never true Within the past 12 months, the food you bought just didn't last and you didn't have money to get more (ref #4): Never true In the past 12 months, has lack of reliable transportation kept you from medical appointments, meetings, work or from getting things needed for daily living? (ref #5): No In the past 12 months, has the electric, gas, oil, or water company threatened to shut off services in your home? (ref #6): No How often does anyone, including family and friends, physically hurt you? (ref #7): Never (1) How often does anyone, including family and friends, insult or talk down to you? (ref #8): Never (1) How often does anyone, including friends and family, threaten you with harm? (ref #9): Never (1) How often does anyone, including family and friends, scream or curse at you? (ref #10): Never (1)  Engagement Notes [name] on 07/11/2021 05:34 PM CPP Chart Review: 30 min CPP Office Visit: 37 min CPP Office Visit Documentation: 60 min CPP Coordination of Care: 20 min [facility] Care Plan Completion: 33 Minutes CPP Care Plan Review: 20 min  Engagement Notes [name] on 07/11/2021 05:27 PM HC follow up: follow-up with patient in 2 weeks to assess BP readings. Please document daily BP readings from patient  CPP follow up: Quick pt call in 1 week to discuss pt goals of treatment for SOB, if patient is following with pulmonologist, interest in nebulizer therapy or trying another inhaler. Will emphasize, pt may not feel difference immediately but will help pt long term to slow progression of disease.  CPP follow up:   07/21/21 AA: Completed patient call. [place] is not currently seeing a rheumatologist and does not desire to change prednisone dose for now as RA is controlled.   Although  counseled patient on purpose of inhaler use in COPD and  Asthma, pt is not interested in inhaler use at this time. Discussed nebulizers with pt, defer to pulmonology  Pt had not been checking BP at home. States he does not have a BP monitor. Encouraged patient to get a BP monitor. Will consider pt for a BP RPM when available  CP Patient call time: 10 minutes  Pt counseling on prednisone side effects especially with long term use. Pt counseling on RA symptoms , if pt seeing a rheumatologist and interested in a rheumatologist referral? Quick pt call in 1 week to discuss Asthma symptoms and patient goals of treatment. Ask pt if checking BP, if not friendly reminder to check BP daily as HC will be calling to review BP readings.          Rachelle Hora Jeannett Senior, PharmD  Clinical Pharmacist  [name]  [phone]

## 2021-07-13 ENCOUNTER — Ambulatory Visit (INDEPENDENT_AMBULATORY_CARE_PROVIDER_SITE_OTHER): Payer: PPO | Admitting: Internal Medicine

## 2021-07-13 ENCOUNTER — Encounter: Payer: Self-pay | Admitting: Internal Medicine

## 2021-07-13 ENCOUNTER — Other Ambulatory Visit: Payer: Self-pay

## 2021-07-13 VITALS — BP 113/74 | HR 91 | Temp 97.9°F | Resp 16 | Ht 77.0 in | Wt 278.0 lb

## 2021-07-13 DIAGNOSIS — M0579 Rheumatoid arthritis with rheumatoid factor of multiple sites without organ or systems involvement: Secondary | ICD-10-CM

## 2021-07-13 DIAGNOSIS — N179 Acute kidney failure, unspecified: Secondary | ICD-10-CM | POA: Diagnosis not present

## 2021-07-13 DIAGNOSIS — I442 Atrioventricular block, complete: Secondary | ICD-10-CM

## 2021-07-13 DIAGNOSIS — U071 COVID-19: Secondary | ICD-10-CM | POA: Diagnosis not present

## 2021-07-13 DIAGNOSIS — I10 Essential (primary) hypertension: Secondary | ICD-10-CM | POA: Diagnosis not present

## 2021-07-13 DIAGNOSIS — I459 Conduction disorder, unspecified: Secondary | ICD-10-CM | POA: Diagnosis not present

## 2021-07-13 DIAGNOSIS — E782 Mixed hyperlipidemia: Secondary | ICD-10-CM

## 2021-07-13 DIAGNOSIS — Z79899 Other long term (current) drug therapy: Secondary | ICD-10-CM

## 2021-07-13 DIAGNOSIS — Z950 Presence of cardiac pacemaker: Secondary | ICD-10-CM | POA: Diagnosis not present

## 2021-07-13 DIAGNOSIS — I951 Orthostatic hypotension: Secondary | ICD-10-CM | POA: Diagnosis not present

## 2021-07-13 LAB — COMPLETE METABOLIC PANEL WITH GFR
AG Ratio: 1.3 (calc) (ref 1.0–2.5)
ALT: 22 U/L (ref 9–46)
AST: 20 U/L (ref 10–35)
Albumin: 3.5 g/dL — ABNORMAL LOW (ref 3.6–5.1)
Alkaline phosphatase (APISO): 63 U/L (ref 35–144)
BUN: 17 mg/dL (ref 7–25)
CO2: 29 mmol/L (ref 20–32)
Calcium: 8.7 mg/dL (ref 8.6–10.3)
Chloride: 105 mmol/L (ref 98–110)
Creat: 1.12 mg/dL (ref 0.70–1.22)
Globulin: 2.6 g/dL (calc) (ref 1.9–3.7)
Glucose, Bld: 131 mg/dL — ABNORMAL HIGH (ref 65–99)
Potassium: 4.5 mmol/L (ref 3.5–5.3)
Sodium: 141 mmol/L (ref 135–146)
Total Bilirubin: 0.6 mg/dL (ref 0.2–1.2)
Total Protein: 6.1 g/dL (ref 6.1–8.1)
eGFR: 63 mL/min/{1.73_m2} (ref >=60)

## 2021-07-13 LAB — CBC WITH DIFFERENTIAL/PLATELET
Absolute Monocytes: 706 cells/uL (ref 200–950)
Basophils Absolute: 32 cells/uL (ref 0–200)
Basophils Relative: 0.3 %
Eosinophils Absolute: 21 cells/uL (ref 15–500)
Eosinophils Relative: 0.2 %
HCT: 38.5 % (ref 38.5–50.0)
Hemoglobin: 12.8 g/dL — ABNORMAL LOW (ref 13.2–17.1)
Lymphs Abs: 1177 cells/uL (ref 850–3900)
MCH: 30.9 pg (ref 27.0–33.0)
MCHC: 33.2 g/dL (ref 32.0–36.0)
MCV: 93 fL (ref 80.0–100.0)
MPV: 9.9 fL (ref 7.5–12.5)
Monocytes Relative: 6.6 %
Neutro Abs: 8763 cells/uL — ABNORMAL HIGH (ref 1500–7800)
Neutrophils Relative %: 81.9 %
Platelets: 223 10*3/uL (ref 140–400)
RBC: 4.14 10*6/uL — ABNORMAL LOW (ref 4.20–5.80)
RDW: 12.6 % (ref 11.0–15.0)
Total Lymphocyte: 11 %
WBC: 10.7 10*3/uL (ref 3.8–10.8)

## 2021-07-13 LAB — MAGNESIUM: Magnesium: 2.1 mg/dL (ref 1.5–2.5)

## 2021-07-13 NOTE — Progress Notes (Signed)
Future Appointments  Date Time Provider Department  10/04/2021  2:15 PM Vickie Epley, MD [facility]  10/25/2021  2:30 PM Liane Comber, NP [facility]  11/22/2021 11:00 AM Newton Pigg, [facility]  04/25/2022  3:00 PM Unk Pinto, MD [facility] Follow-Up     This very nice 88 y.o.  MWM was admitted to the hospital on  07/02/2021  and patient was discharged from the hospital 10 days ago on  07/03/2021  . The patient now presents for follow up for transition from recent hospitalization .  The day after discharge  our clinical staff contacted the patient to assure stability and schedule a follow up appointment. The discharge summary, medications and diagnostic test results were reviewed before meeting with the patient. The patient was admitted for:   Stokes-Adams syncope  Complete heart block (HCC) S/P placement of cardiac pacemaker Orthostatic hypotension COVID-19 virus infection AKI (acute kidney injury) ([place]) Essential hypertension Hyperlipidemia, mixed Rheumatoid arthritis ([place])      Patient is a very nice 88 yo MWM with HTN, HLD, Rh Arthritis,  COPD/ Asthma, who was admitted with Syncope attributed to Stokes-Adams and orthostatic Hypotension consequent of Dehydration Myra Rude. He was initially hypotensive & was resuscitated with IVF. PPM was planted for Heart Block. Patient also tested (+) Positive for Covid-19 and was treated with Paxlovid.       Hospitalization discharge instructions and medications are reconciled with the patient.      Patient is also followed with Hypertension, Hyperlipidemia,  COPD/Asthma, Rh Arthritis, Pre-Diabetes and Vitamin D Deficiency.      Patient is treated for HTN & BP has been controlled at home. Todays  . Patient has had no complaints of any cardiac type chest pain, palpitations, dyspnea/orthopnea/PND, dizziness, claudication, or dependent edema.     Hyperlipidemia is controlled with diet & meds. Patient denies  myalgias or other med SEs. Last Lipids were at goal :  Lab Results  Component Value Date   CHOL 106 04/20/2021   HDL 42 04/20/2021   LDLCALC 48 04/20/2021   TRIG 77 04/20/2021   CHOLHDL 2.5 04/20/2021      Also, the patient has history of  PreDiabetes and has had no symptoms of reactive hypoglycemia, diabetic polys, paresthesias or visual blurring.  Last A1c was near goal :  Lab Results  Component Value Date   HGBA1C 5.7 (H) 03/05/2021      Further, the patient also has history of Vitamin D Deficiency and supplements vitamin D without any suspected side-effects. Last vitamin D was  at goal :  Lab Results  Component Value Date   VD25OH 86 04/20/2021     Current Outpatient Medications on File Prior to Visit  Medication Sig   VITAMIN D  5,000 u Take 5,000 Units by mouth as directed. T,Th,Sat,Sun 1 per day 2 per day other days of the week   finasteride  5 MG tablet Take  1 tablet  Daily  for Prostate   magnesium  500 MG tablet Take 500 mg by mouth daily.   olmesartan 20 MG tablet Take 1 tablet  Daily  for BP   predniSONE  5 MG tab Take 10 mg by mouth daily.)   rosuvastatin 20 MG tab Take  1 tablet  Daily    terazosin (10 MG cap Take  1 capsule  at Bedtime    vitamin B-12  1000 MCG tabl Take  3 times a week.     Allergies  Allergen Reactions  Ace Inhibitors    Asa [Aspirin]     High Dose asprin   Penicillins Hives   Vasotec [Enalapril]     Cough   Clindamycin/Lincomycin     Hives    PMHx:   Past Medical History:  Diagnosis Date   Allergy    Asthma    BPH (benign prostatic hypertrophy)    COPD (chronic obstructive pulmonary disease) (HCC)    DJD (degenerative joint disease)    GERD (gastroesophageal reflux disease)    History of urinary retention    Hypertension    IBS (irritable bowel syndrome)    RBBB    Rheumatoid arthritis(714.0)    DR. HAWKES   Scrotal lesion    Short of breath on exertion    Immunization History  Administered Date(s) Administered    DT (Pediatric) 04/15/2014   Influenza, High Dose  04/15/2014, 02/16/2015, 02/18/2016, 02/04/2018, 03/09/2020, 03/11/2021   Influenza 03/10/2013, 02/07/2017, 02/13/2019   PFIZER SARS-COV-2 Vacc 05/27/2019, 06/15/2019, 03/17/2020   Pneumococcal -13 04/15/2014   Pneumococcal -23 05/15/2001, 03/30/2011   Td 05/15/2002   Zoster, Live 04/01/2012   Past Surgical History:  Procedure Laterality Date   CARDIOVASCULAR STRESS TEST  04-26-2011  DR BRACKBILL   LOW RISK STRESS NUCLEAR STUDY/ NO EVIDENCE ISCHEMIA   LOOP RECORDER INSERTION N/A 03/07/2021   Procedure: LOOP RECORDER INSERTION;  Surgeon: Vickie Epley, MD;  Location: [place] [facility];  Service: Cardiovascular;  Laterality: N/A;   LOOP RECORDER REMOVAL N/A 07/03/2021   Procedure: LOOP RECORDER REMOVAL;  Surgeon: Evans Lance, MD;  Location: [place] [facility];  Service: Cardiovascular;  Laterality: N/A;   LUMBAR FUSION  X3  LAST ONE 1996   PACEMAKER IMPLANT N/A 07/03/2021   Procedure: PACEMAKER IMPLANT;  Surgeon: Evans Lance, MD;  Location: [place] [facility];  Service: Cardiovascular;  Laterality: N/A;   SCROTAL EXPLORATION Left 07/17/2012   Procedure:  EXCISION  OF LEFT SCROTAL SKIN LESION;  Surgeon: Molli Hazard, MD;  Location: [facility];  Service: Urology;  Laterality: Left;   TRANSTHORACIC ECHOCARDIOGRAM  04-26-2011     MILD LVH/ LVSF NORMAL/ EF 73-71%/ GRADE I DIASTOLIC DYSFUNCTION/ MILD MITRAL REGURG.   TRANSURETHRAL RESECTION OF PROSTATE  SEVERAL YRS AGO   FHx:    Reviewed / unchanged  SHx:    Reviewed / unchanged  Systems Review:  Constitutional: Denies fever, chills, wt changes, headaches, insomnia, fatigue, night sweats, change in appetite. Eyes: Denies redness, blurred vision, diplopia, discharge, itchy, watery eyes.  ENT: Denies discharge, congestion, post nasal drip, epistaxis, sore throat, earache, hearing loss, dental pain, tinnitus, vertigo, sinus pain, snoring.  CV: Denies chest  pain, palpitations, irregular heartbeat, syncope, dyspnea, diaphoresis, orthopnea, PND, claudication or edema. Respiratory: denies cough, dyspnea, DOE, pleurisy, hoarseness, laryngitis, wheezing.  Gastrointestinal: Denies dysphagia, odynophagia, heartburn, reflux, water brash, abdominal pain or cramps, nausea, vomiting, bloating, diarrhea, constipation, hematemesis, melena, hematochezia  or hemorrhoids. Genitourinary: Denies dysuria, frequency, urgency, nocturia, hesitancy, discharge, hematuria or flank pain. Musculoskeletal: Denies arthralgias, myalgias, stiffness, jt. swelling, pain, limping or strain/sprain.  Skin: Denies pruritus, rash, hives, warts, acne, eczema or change in skin lesion(s). Neuro: No weakness, tremor, incoordination, spasms, paresthesia or pain. Psychiatric: Denies confusion, memory loss or sensory loss. Endo: Denies change in weight, skin or hair change.  Heme/Lymph: No excessive bleeding, bruising or enlarged lymph nodes.  Physical Exam  There were no vitals taken for this visit.  Appears well nourished, well groomed  and in no distress.  Eyes: PERRLA, EOMs, conjunctiva no swelling or erythema. Sinuses: No frontal/maxillary tenderness ENT/Mouth: EAC's clear, TM's nl w/o erythema, bulging. Nares clear w/o erythema, swelling, exudates. Oropharynx clear without erythema or exudates. Oral hygiene is good. Tongue normal, non obstructing. Hearing intact.  Neck: Supple. Thyroid nl. Car 2+/2+ without bruits, nodes or JVD. Chest: Respirations nl with BS clear & equal w/o rales, rhonchi, wheezing or stridor.  Cor: Heart sounds normal w/ regular rate and rhythm without sig. murmurs, gallops, clicks or rubs. Peripheral pulses normal and equal  without edema.  Abdomen: Soft & bowel sounds normal. Non-tender w/o guarding, rebound, hernias, masses or organomegaly.  Lymphatics: Unremarkable.  Musculoskeletal: Full ROM all peripheral extremities, joint stability, 5/5 strength and  normal gait.  Skin: Warm, dry without exposed rashes, lesions or ecchymosis apparent.  Neuro: Cranial nerves intact, reflexes equal bilaterally. Sensory-motor testing grossly intact. Tendon reflexes grossly intact.  Pysch: Alert & oriented x 3.  Insight and judgement nl & appropriate. No ideations.       Discussed  regular exercise, BP monitoring, weight control to achieve/maintain BMI less than 25 and discussed meds and SE's. Recommended labs to assess and monitor clinical status with further disposition pending results of labs. Over 30 minutes of exam, counseling, chart review was performed.   Kirtland Bouchard, MD

## 2021-07-13 NOTE — Patient Instructions (Signed)
Due to recent changes in healthcare laws, you may see the results of your imaging and laboratory studies on MyChart before your provider has had a chance to review them.  We understand that in some cases there may be results that are confusing or concerning to you. Not all laboratory results come back in the same time frame and the provider may be waiting for multiple results in order to interpret others.  Please give us 48 hours in order for your provider to thoroughly review all the results before contacting the office for clarification of your results.  °++++++++++++++++++++++++++++++++++ ° Vit D  & °Vit C 1,000 mg   °are recommended to help protect  °against the Covid-19 and other Corona viruses.  ° ° Also it's recommended  °to take  °Zinc 50 mg  °to help  °protect against the Covid-19   °and best place to get ° is also on [employer].com  °and don't pay more than 6-8 cents /pill !  ° °===================================== °Coronavirus (COVID-19) Are you at risk? ° °Are you at risk for the Coronavirus (COVID-19)? ° °To be considered HIGH RISK for Coronavirus (COVID-19), you have to meet the following criteria: ° °Traveled to [place], [place], [place], [place] or [place]; or in the [place] to [place], [place], [place]  °or [place]; and have fever, cough, and shortness of breath within the last 2 weeks of travel OR °Been in close contact with a person diagnosed with COVID-19 within the last 2 weeks and have  °fever, cough,and shortness of breath ° °IF YOU DO NOT MEET THESE CRITERIA, YOU ARE CONSIDERED LOW RISK FOR COVID-19. ° °What to do if you are HIGH RISK for COVID-19? ° °If you are having a medical emergency, call 911. °Seek medical care right away. Before you go to a doctor’s office, urgent care or emergency department, ° call ahead and tell them about your recent travel, contact with someone diagnosed with COVID-19  ° and your symptoms.  °You should receive instructions from your physician’s office  regarding next steps of care.  °When you arrive at healthcare provider, tell the healthcare staff immediately you have returned from  °visiting [place], [place], [place], [place] or [place]; or traveled in the [place] to [place], [place],  °[place] or [place] in the last two weeks or you have been in close contact with a person diagnosed with  °COVID-19 in the last 2 weeks.   °Tell the health care staff about your symptoms: fever, cough and shortness of breath. °After you have been seen by a medical provider, you will be either: °Tested for (COVID-19) and discharged home on quarantine except to seek medical care if  °symptoms worsen, and asked to  °Stay home and avoid contact with others until you get your results (4-5 days)  °Avoid travel on public transportation if possible (such as bus, train, or airplane) or °Sent to the Emergency Department by EMS for evaluation, COVID-19 testing  and  °possible admission depending on your condition and test results. ° °What to do if you are LOW RISK for COVID-19? ° °Reduce your risk of any infection by using the same precautions used for avoiding the common cold or flu:  °Wash your hands often with soap and warm water for at least 20 seconds.  If soap and water are not readily available,  °use an alcohol-based hand sanitizer with at least 60% alcohol.  °If coughing or sneezing, cover your mouth and nose by coughing   or sneezing into the elbow areas of your shirt or coat, ° into a tissue or into your sleeve (not your hands). °Avoid shaking hands with others and consider head nods or verbal greetings only. °Avoid touching your eyes, nose, or mouth with unwashed hands.  °Avoid close contact with people who are sick. °Avoid places or events with large numbers of people in one location, like concerts or sporting events. °Carefully consider travel plans you have or are making. °If you are planning any travel outside or inside the [place], visit the CDC’s Travelers’ Health  webpage for the latest health notices. °If you have some symptoms but not all symptoms, continue to monitor at home and seek medical attention  °if your symptoms worsen. °If you are having a medical emergency, call 911. ° ° °++++++++++++++++++++++++++++++++ °Recommend Adult Low Dose Aspirin or  °coated  Aspirin 81 mg daily  °To reduce risk of Colon Cancer 40 %, ° Skin Cancer 26 % ,  °Melanoma 46% ° and  °Pancreatic cancer 60% °++++++++++++++++++++++++++++++++ °Vitamin D goal ° is between 70-100.  °Please make sure that you are taking your Vitamin D as directed.  °It is very important as a natural anti-inflammatory  °helping hair, skin, and nails, as well as reducing stroke and heart attack risk.  °It helps your bones and helps with mood. °It also decreases numerous cancer risks so please take it as directed.  °Low Vit D is associated with a 200-300% higher risk for CANCER  °and 200-300% higher risk for HEART   ATTACK  &  STROKE.   °...................................... °It is also associated with higher death rate at younger ages,  °autoimmune diseases like Rheumatoid arthritis, Lupus, Multiple Sclerosis.    °Also many other serious conditions, like depression, Alzheimer's °Dementia, infertility, muscle aches, fatigue, fibromyalgia - just to name a few. °++++++++++++++++++++ °Recommend the book "The END of DIETING" by Dr Joel Fuhrman  °& the book "The END of DIABETES " by Dr Joel Fuhrman °At [employer].com - get book & Audio CD's  °  Being diabetic has a  300% increased risk for heart attack, stroke, cancer, and alzheimer- type vascular dementia. It is very important that you work harder with diet by avoiding all foods that are white. Avoid white rice (brown & wild rice is OK), white potatoes (sweetpotatoes in moderation is OK), White bread or wheat bread or anything made out of white flour like bagels, donuts, rolls, buns, biscuits, cakes, pastries, cookies, pizza crust, and pasta (made from white flour & egg whites)  - vegetarian pasta or spinach or wheat pasta is OK. Multigrain breads like Arnold's or [employer], or multigrain sandwich thins or flatbreads.  Diet, exercise and weight loss can reverse and cure diabetes in the early stages.  Diet, exercise and weight loss is very important in the control and prevention of complications of diabetes which affects every system in your body, ie. Brain - dementia/stroke, eyes - glaucoma/blindness, heart - heart attack/heart failure, kidneys - dialysis, stomach - gastric paralysis, intestines - malabsorption, nerves - severe painful neuritis, circulation - gangrene & loss of a leg(s), and finally cancer and Alzheimers. ° °  I recommend avoid fried & greasy foods,  sweets/candy, white rice (brown or wild rice or Quinoa is OK), white potatoes (sweet potatoes are OK) - anything made from white flour - bagels, doughnuts, rolls, buns, biscuits,white and wheat breads, pizza crust and traditional pasta made of white flour & egg white(vegetarian pasta or spinach or wheat pasta is OK).    Multi-grain bread is OK - like multi-grain flat bread or sandwich thins. Avoid alcohol in excess. Exercise is also important. ° °  Eat all the vegetables you want - avoid meat, especially red meat and dairy - especially cheese.  Cheese is the most concentrated form of trans-fats which is the worst thing to clog up our arteries. Veggie cheese is OK which can be found in the fresh produce section at [employer] or [employer] or [employer] ° °+++++++++++++++++++++ °DASH Eating Plan ° °DASH stands for "Dietary Approaches to Stop Hypertension."  ° °The DASH eating plan is a healthy eating plan that has been shown to reduce high blood pressure (hypertension). Additional health benefits may include reducing the risk of type 2 diabetes mellitus, heart disease, and stroke. The DASH eating plan may also help with weight loss. °WHAT DO I NEED TO KNOW ABOUT THE DASH EATING PLAN? °For the DASH eating plan, you will  follow these general guidelines: °Choose foods with a percent daily value for sodium of less than 5% (as listed on the food label). °Use salt-free seasonings or herbs instead of table salt or sea salt. °Check with your health care provider or pharmacist before using salt substitutes. °Eat lower-sodium products, often labeled as "lower sodium" or "no salt added." °Eat fresh foods. °Eat more vegetables, fruits, and low-fat dairy products. °Choose whole grains. Look for the word "whole" as the first word in the ingredient list. °Choose fish  °Limit sweets, desserts, sugars, and sugary drinks. °Choose heart-healthy fats. °Eat veggie cheese  °Eat more home-cooked food and less restaurant, buffet, and fast food. °Limit fried foods. °Cook foods using methods other than frying. °Limit canned vegetables. If you do use them, rinse them well to decrease the sodium. °When eating at a restaurant, ask that your food be prepared with less salt, or no salt if possible. °                  °   WHAT FOODS CAN I EAT? °Read Dr Joel Fuhrman's books on The End of Dieting & The End of Diabetes ° °Grains °Whole grain or whole wheat bread. Brown rice. Whole grain or whole wheat pasta. Quinoa, bulgur, and whole grain cereals. Low-sodium cereals. Corn or whole wheat flour tortillas. Whole grain cornbread. Whole grain crackers. Low-sodium crackers. ° °Vegetables °Fresh or frozen vegetables (raw, steamed, roasted, or grilled). Low-sodium or reduced-sodium tomato and vegetable juices. Low-sodium or reduced-sodium tomato sauce and paste. Low-sodium or reduced-sodium canned vegetables.  ° °Fruits °All fresh, canned (in natural juice), or frozen fruits. ° °Protein Products ° All fish and seafood.  Dried beans, peas, or lentils. Unsalted nuts and seeds. Unsalted canned beans. ° °Dairy °Low-fat dairy products, such as skim or 1% milk, 2% or reduced-fat cheeses, low-fat ricotta or cottage cheese, or plain low-fat yogurt. Low-sodium or reduced-sodium  cheeses. ° °Fats and Oils °Tub margarines without trans fats. Light or reduced-fat mayonnaise and salad dressings (reduced sodium). Avocado. Safflower, olive, or canola oils. Natural peanut or almond butter. ° °Other °Unsalted popcorn and pretzels. °The items listed above may not be a complete list of recommended foods or beverages. Contact your dietitian for more options. ° °+++++++++++++++ ° °WHAT FOODS ARE NOT RECOMMENDED? °Grains/ White flour or wheat flour °White bread. White pasta. White rice. Refined cornbread. Bagels and croissants. Crackers that contain trans fat. ° °Vegetables ° °Creamed or fried vegetables. Vegetables in a . Regular canned vegetables. Regular canned tomato sauce and paste. Regular tomato and vegetable juices. ° °  Fruits Dried fruits. Canned fruit in light or heavy syrup. Fruit juice.  Meat and Other Protein Products Meat in general - RED meat & White meat.  Fatty cuts of meat. Ribs, chicken wings, all processed meats as bacon, sausage, bologna, salami, fatback, hot dogs, bratwurst and packaged luncheon meats.  Dairy Whole or 2% milk, cream, half-and-half, and cream cheese. Whole-fat or sweetened yogurt. Full-fat cheeses or blue cheese. Non-dairy creamers and whipped toppings. Processed cheese, cheese spreads, or cheese curds.  Condiments Onion and garlic salt, seasoned salt, table salt, and sea salt. Canned and packaged gravies. Worcestershire sauce. Tartar sauce. Barbecue sauce. Teriyaki sauce. Soy sauce, including reduced sodium. Steak sauce. Fish sauce. Oyster sauce. Cocktail sauce. Horseradish. Ketchup and mustard. Meat flavorings and tenderizers. Bouillon cubes. Hot sauce. Tabasco sauce. Marinades. Taco seasonings. Relishes.  Fats and Oils Butter, stick margarine, lard, shortening and bacon fat. Coconut, palm kernel, or palm oils. Regular salad dressings.  Pickles and olives. Salted popcorn and pretzels.  The items listed above may not be a complete list of foods and  beverages to avoid.

## 2021-07-14 ENCOUNTER — Ambulatory Visit (INDEPENDENT_AMBULATORY_CARE_PROVIDER_SITE_OTHER): Payer: PPO

## 2021-07-14 DIAGNOSIS — R55 Syncope and collapse: Secondary | ICD-10-CM | POA: Diagnosis not present

## 2021-07-14 LAB — CUP PACEART INCLINIC DEVICE CHECK
Battery Remaining Longevity: 135 mo
Battery Voltage: 3.21 V
Brady Statistic AP VP Percent: 26.83 %
Brady Statistic AP VS Percent: 0.03 %
Brady Statistic AS VP Percent: 72.75 %
Brady Statistic AS VS Percent: 0.39 %
Brady Statistic RA Percent Paced: 26.55 %
Brady Statistic RV Percent Paced: 99.59 %
Date Time Interrogation Session: 20230302131608
Implantable Lead Implant Date: 20230219
Implantable Lead Implant Date: 20230219
Implantable Lead Location: 753859
Implantable Lead Location: 753860
Implantable Lead Model: 3830
Implantable Lead Model: 5076
Implantable Pulse Generator Implant Date: 20230219
Lead Channel Impedance Value: 361 Ohm
Lead Channel Impedance Value: 418 Ohm
Lead Channel Impedance Value: 418 Ohm
Lead Channel Impedance Value: 532 Ohm
Lead Channel Pacing Threshold Amplitude: 0.5 V
Lead Channel Pacing Threshold Amplitude: 0.75 V
Lead Channel Pacing Threshold Pulse Width: 0.4 ms
Lead Channel Pacing Threshold Pulse Width: 0.4 ms
Lead Channel Sensing Intrinsic Amplitude: 13.5 mV
Lead Channel Sensing Intrinsic Amplitude: 4.125 mV
Lead Channel Setting Pacing Amplitude: 3.5 V
Lead Channel Setting Pacing Amplitude: 3.5 V
Lead Channel Setting Pacing Pulse Width: 0.4 ms
Lead Channel Setting Sensing Sensitivity: 1.2 mV

## 2021-07-14 MED ORDER — DOXYCYCLINE HYCLATE 100 MG PO TABS: 100.0000 mg | ORAL_TABLET | Freq: Two times a day (BID) | ORAL | Status: AC

## 2021-07-14 MED ORDER — DOXYCYCLINE HYCLATE 100 MG PO TABS: 100.0000 mg | ORAL_TABLET | Freq: Two times a day (BID) | ORAL | Status: DC

## 2021-07-14 NOTE — Progress Notes (Signed)
Wound check appointment. Steri-strips removed from extracted ILR site and PPM implant site. PPM wound without redness or edema. Incision edges approximated, wound well healed. [place] drainage noted from ILR explant site. Dr. Lovena Le in to assess. Patient prescribed doxycycline 100mg  BID x 7 days. Normal device function. Thresholds, sensing, and impedances consistent with implant measurements. Device programmed at 3.5V/auto capture programmed on for extra safety margin until 3 month visit. Histogram distribution appropriate for patient and level of activity. No mode switches or high ventricular rates noted. Patient educated about wound care, arm mobility, lifting restrictions. Patient is enrolled in remote monitoring. He has decided to not utilize the app and will continue to use his loop home monitor. He is instructed to return home and plug monitor in at bedside. Next transmission scheduled 10/03/21. 91 day follow up with Dr. Quentin Ore 10/04/21 ?

## 2021-07-14 NOTE — Progress Notes (Signed)
=============================================================== ?-   Test results slightly outside the reference range are not unusual. ?If there is anything important, I will review this with you,  ?otherwise it is considered normal test values.  ?If you have further questions,  ?please do not hesitate to contact me at the office or via My Chart.  ?=============================================================== ?=============================================================== ? ? - CBC - Kidneys - Electrolytes - Liver & Magnesium  ? ?- all  Normal / OK ? ?=============================================================== ?=============================================================== ? ?

## 2021-07-14 NOTE — Patient Instructions (Signed)
? ?  After Your Pacemaker ? ? ?Monitor your pacemaker site for redness, swelling, and drainage. Call the device clinic at [phone] if you experience these symptoms or fever/chills. ? ?Your incision was closed with Steri-strips or staples:  You may shower and wash your incision with soap and water. Avoid lotions, ointments, or perfumes over your incision until it is well-healed. ? ?You may use a hot tub or a pool after your wound check appointment if the incision is completely closed. ? ?Do not lift, push or pull greater than 10 pounds with the affected arm until 6 weeks after your procedure. There are no other restrictions in arm movement after your wound check appointment. April 3 ? ?You may drive, unless driving has been restricted by your healthcare providers. ? ?Your Pacemaker is MRI compatible. ? ?Remote monitoring is used to monitor your pacemaker from home. This monitoring is scheduled every 91 days by our office. It allows [place] to keep an eye on the functioning of your device to ensure it is working properly. You will routinely see your Electrophysiologist annually (more often if necessary).  ?

## 2021-07-27 ENCOUNTER — Encounter: Payer: PPO | Admitting: Cardiology

## 2021-08-12 ENCOUNTER — Telehealth: Payer: Self-pay | Admitting: Cardiology

## 2021-08-12 NOTE — Telephone Encounter (Signed)
The patient called in complaining of swelling in his left ankle and a little less swelling in the right. Noticed this in the last week. Current wt 275 lbs. Does not take daily weights. Patients daughter (DPR) is a Marine scientist, stating he has pitting edema in both ankles/feet. Patient is voiding with no problems. No change in diet or fluid intake. No missed doses of medication. Current VS: 145/87 HR 81. Advised patient to elevate feet while sitting to help reduced swelling and limit his salt intake. Start taking weight QAM going forward. ED precautions given. Patient and daughter verbalized understanding. Will forward to MD for advisement.  ?

## 2021-08-12 NOTE — Telephone Encounter (Signed)
?  Per MyChart scheduling message: ? ?Pt c/o swelling: STAT is pt has developed SOB within 24 hours ? ?If swelling, where is the swelling located? Ankles are swelling and pitting edema is present. ? ?How much weight have you gained and in what time span? No answer ? ?Have you gained 3 pounds in a day or 5 pounds in a week? No answer ? ?Do you have a log of your daily weights (if so, list)? no ? ?Are you currently taking a fluid pill? no ? ?Are you currently SOB? Am SOB in a normal state with exertion (this is not new) ?      SOB is mostly when up walking a short distance ? ?Have you traveled recently?  no ?

## 2021-08-15 ENCOUNTER — Encounter: Payer: Self-pay | Admitting: Cardiology

## 2021-08-15 NOTE — Telephone Encounter (Signed)
See phone call from today

## 2021-08-15 NOTE — Telephone Encounter (Signed)
?  Recommend follow up with primary care or cardiology APP.  ?Thanks,  ?Lars Mage  ? ?Made appt with Oda Kilts PA on 4/24 at 12:20 pm.  ?Gave ED precautions. Verbalized understanding with read back.  ?

## 2021-08-16 ENCOUNTER — Encounter: Payer: Self-pay | Admitting: Internal Medicine

## 2021-08-16 ENCOUNTER — Ambulatory Visit
Admission: RE | Admit: 2021-08-16 | Discharge: 2021-08-16 | Disposition: A | Discharge status: Home / Self Care | Payer: PPO | Source: Ambulatory Visit | Attending: Internal Medicine | Admitting: Internal Medicine

## 2021-08-16 ENCOUNTER — Ambulatory Visit (INDEPENDENT_AMBULATORY_CARE_PROVIDER_SITE_OTHER): Payer: PPO | Admitting: Internal Medicine

## 2021-08-16 VITALS — BP 144/80 | HR 66 | Temp 97.9°F | Resp 17 | Ht 77.0 in | Wt 277.6 lb

## 2021-08-16 DIAGNOSIS — R609 Edema, unspecified: Secondary | ICD-10-CM

## 2021-08-16 DIAGNOSIS — B353 Tinea pedis: Secondary | ICD-10-CM

## 2021-08-16 DIAGNOSIS — I10 Essential (primary) hypertension: Secondary | ICD-10-CM

## 2021-08-16 DIAGNOSIS — L0231 Cutaneous abscess of buttock: Secondary | ICD-10-CM

## 2021-08-16 DIAGNOSIS — J449 Chronic obstructive pulmonary disease, unspecified: Secondary | ICD-10-CM

## 2021-08-16 DIAGNOSIS — B351 Tinea unguium: Secondary | ICD-10-CM | POA: Diagnosis not present

## 2021-08-16 DIAGNOSIS — Z79899 Other long term (current) drug therapy: Secondary | ICD-10-CM

## 2021-08-16 DIAGNOSIS — Z950 Presence of cardiac pacemaker: Secondary | ICD-10-CM

## 2021-08-16 DIAGNOSIS — R0600 Dyspnea, unspecified: Secondary | ICD-10-CM | POA: Diagnosis not present

## 2021-08-16 MED ORDER — FUROSEMIDE 40 MG PO TABS: ORAL_TABLET | ORAL | 3 refills | Status: DC

## 2021-08-16 MED ORDER — TERBINAFINE HCL 250 MG PO TABS: ORAL_TABLET | ORAL | 1 refills | Status: DC

## 2021-08-16 MED ORDER — DOXYCYCLINE HYCLATE 100 MG PO CAPS: ORAL_CAPSULE | ORAL | 0 refills | Status: DC

## 2021-08-16 NOTE — Progress Notes (Signed)
<><><><><><><><><><><><><><><><><><><><><><><><><><><><><><><><><> ?<><><><><><><><><><><><><><><><><><><><><><><><><><><><><><><><><> ? ?-    CXR - OK - No sign of Heart Failure , Pneumonia or Cancer  --- >>   Great  ! ? ?<><><><><><><><><><><><><><><><><><><><><><><><><><><><><><><><><> ?<><><><><><><><><><><><><><><><><><><><><><><><><><><><><><><><><> ?

## 2021-08-16 NOTE — Progress Notes (Signed)
? ? ?Future Appointments  ?Date Time Provider Department  ?08/16/2021  4:00 PM Unk Pinto, MD [facility]  ?09/05/2021 12:20 PM Shirley Friar, PA-C [facility]  ?10/04/2021  2:15 PM Vickie Epley, MD [facility]  ?10/25/2021  2:30 PM Liane Comber, NP [facility]  ?11/22/2021 11:00 AM Newton Pigg, [facility]  ?04/25/2022  3:00 PM Unk Pinto, MD [facility]  ? ?History of Present Illness: ? ?   Patient is a very nice 88 yo MWM with HTN, PPM,  HLD, Rh Arthritis,  COPD/ Asthma who was recently hospitalized  about 6 weeks ago on 07/02/2021 with Syncope and had PPM implanted for CHB.  He presents now with c/o worsening dependent edema. He denies any exertional type Chest Pains. He had CXR earlier today showing COPD with Normal heart size /shape w/o evidence of CHF.  ?He's also c/o a tender lump in his left buttock. In addition he relates concerns re: apparent tinea pedis and thickened deformed toe nails. ? ?Medications ? ?  predniSONE 5 MG tablet, Take 1 tablet 1 x / day  ? ?  olmesartan (BENICAR) 20 MG tablet, Take 1 tablet  Daily  f ?  rosuvastatin (CRESTOR) 20 MG tablet, Take  1 tablet  Daily   ?  terazosin 10 MG capsule, Take  1 capsule  at Bedtime  ? ?  vitamin B-12    1000 MCG tablet, Take 3 times a week. ?  VITAMIN D 5000 u , Take 1 tab 4 x /week TTSS    &     2 tabs  3 x /week on MWF ?  finasteride (PROSCAR) 5 MG tablet, Take  1 tablet  Daily  for Prostate ?  magnesium 500 MG tablet, Take 5daily. ?  Naturebell Zinc Quercitin w/Vit C and D3 (130 mg ) - takes 1 capsule daily.  ? ?Problem list ?He has Rheumatoid arthritis ([place]); GERD (gastroesophageal reflux disease); DJD (degenerative joint disease); IBS (irritable bowel syndrome); COPD (chronic obstructive pulmonary disease) ([place]); Benign prostatic hyperplasia; Asthma; Essential hypertension; Mixed hyperlipidemia; Other abnormal glucose; Vitamin D deficiency; Medication management; Morbid obesity ([place])- BMI 30+ with COPD; Aortic  atherosclerosis ([place]) by CT scan 2011; Syncope; Bradycardia; History of skin cancer; B12 deficiency; COVID-19 virus infection; AKI (acute kidney injury) ([place]); and Heart block on their problem list. ?  ?Observations/Objective: ? ?BP (!) 144/80   P66   T97.9 ?F   R17   Ht '6\' 5"'$     ?Wt 277 lb 9.6 oz (125.9 kg)   SpO2 95%   BMI 32.92 kg/m?  ? ?HEENT - WNL. ?Neck - supple. No Br, JVD, LNs.  ?Chest - BSdecreased  w/o RRW ?Cor - Nl HS. RRR w/o sig MGR. PP 2(+).  1(+) pretibial & 2 (+) ankle /pedal edema.  ?MS- FROM w/o deformities.  Gait Nl. ?Neuro -  Nl w/o focal abnormalities. ?Skin- moderate tinea pedis and Toenail Onychomycosis with  # 10 dystrophic chalky toenails.  ?         -  There is a tend 1-2 cm lump in the Left lower middle buttock w/o fluctuance.  ? ? ?Assessment and Plan: ? ?1. Essential hypertension ? ?- DG Chest 2 View; Future ?- CBC with Differential/Platelet ?- COMPLETE METABOLIC PANEL WITH GFR ? ?- furosemide (40 MG tablet;  ?Take  1 tablet  Daily  for Fluid Retention / Edema   ?Dispense: 90 tablet; Refill: 3 ? ?2. S/P placement of cardiac pacemaker ? ?- DG Chest 2 View; Future ? ?3.  Chronic obstructive pulmonary disease,  ([place]) ? ?- DG Chest 2 View; Future ? ?4. Abscess of buttock, left ? ?- CBC with Differential/Platelet ?- doxycycline 100 MG capsule;  ?Take  1 capsule  2 x /day  with Food for Abscess in Buttock   ?Dispense: 60 capsule ? ?5. Edema ? ?- COMPLETE METABOLIC PANEL WITH GFR ?- furosemide 40 MG tablet;  ?Take  1 tablet  Daily  for Fluid Retention / Edema  ?Dispense: 90 tablet; Refill: 3 ? ?6. Onychomycosis ? ?- terbinafine 250 MG tablet;  ?Take 1 tablet Daily for ToeNail Fungus & Athlete's Foot   ?Dispense: 90 tablet; Refill: 1 ? ?7. Tinea pedis of both feet ? ?- terbinafine 250 MG tablet; ?Take 1 tablet Daily for ToeNail Fungus & Athlete's Foot   ?Dispense: 90 tablet; Refill: 1 ? ?8. Medication management ? ?- CBC with Differential/Platelet ?- COMPLETE METABOLIC PANEL WITH  GFR ? ? ?Follow Up Instructions: ? ?    I discussed the assessment and treatment plan with the patient. The patient was provided an opportunity to ask questions and all were answered. The patient agreed with the plan and demonstrated an understanding of the instructions. ?  ?    The patient was advised to call back or seek an in-person evaluation if the symptoms worsen or if the condition fails to improve as anticipated. ? ? ? ?Kirtland Bouchard, MD ? ?

## 2021-08-17 LAB — COMPLETE METABOLIC PANEL WITH GFR
AG Ratio: 1.3 (calc) (ref 1.0–2.5)
ALT: 20 U/L (ref 9–46)
AST: 17 U/L (ref 10–35)
Albumin: 3.6 g/dL (ref 3.6–5.1)
Alkaline phosphatase (APISO): 69 U/L (ref 35–144)
BUN: 14 mg/dL (ref 7–25)
CO2: 30 mmol/L (ref 20–32)
Calcium: 9.1 mg/dL (ref 8.6–10.3)
Chloride: 105 mmol/L (ref 98–110)
Creat: 1.13 mg/dL (ref 0.70–1.22)
Globulin: 2.8 g/dL (calc) (ref 1.9–3.7)
Glucose, Bld: 86 mg/dL (ref 65–99)
Potassium: 4.2 mmol/L (ref 3.5–5.3)
Sodium: 141 mmol/L (ref 135–146)
Total Bilirubin: 0.6 mg/dL (ref 0.2–1.2)
Total Protein: 6.4 g/dL (ref 6.1–8.1)
eGFR: 63 mL/min/{1.73_m2} (ref >=60)

## 2021-08-17 LAB — CBC WITH DIFFERENTIAL/PLATELET
Absolute Monocytes: 714 cells/uL (ref 200–950)
Basophils Absolute: 20 cells/uL (ref 0–200)
Basophils Relative: 0.2 %
Eosinophils Absolute: 31 cells/uL (ref 15–500)
Eosinophils Relative: 0.3 %
HCT: 39.7 % (ref 38.5–50.0)
Hemoglobin: 13.2 g/dL (ref 13.2–17.1)
Lymphs Abs: 1652 cells/uL (ref 850–3900)
MCH: 30.8 pg (ref 27.0–33.0)
MCHC: 33.2 g/dL (ref 32.0–36.0)
MCV: 92.8 fL (ref 80.0–100.0)
MPV: 9.8 fL (ref 7.5–12.5)
Monocytes Relative: 7 %
Neutro Abs: 7783 cells/uL (ref 1500–7800)
Neutrophils Relative %: 76.3 %
Platelets: 237 10*3/uL (ref 140–400)
RBC: 4.28 10*6/uL (ref 4.20–5.80)
RDW: 13.1 % (ref 11.0–15.0)
Total Lymphocyte: 16.2 %
WBC: 10.2 10*3/uL (ref 3.8–10.8)

## 2021-08-17 NOTE — Progress Notes (Signed)
<><><><><><><><><><><><><><><><><><><><><><><><><><><><><><><><><> ?<><><><><><><><><><><><><><><><><><><><><><><><><><><><><><><><><> ?-   Test results slightly outside the reference range are not unusual. ?If there is anything important, I will review this with you,  ?otherwise it is considered normal test values.  ?If you have further questions,  ?please do not hesitate to contact me at the office or via My Chart.  ?<><><><><><><><><><><><><><><><><><><><><><><><><><><><><><><><><> ?<><><><><><><><><><><><><><><><><><><><><><><><><><><><><><><><><> ? ?-  All Else - CBC - Kidneys - Electrolytes & Liver ? ?- all  Normal / OK ?===========================================================  ? ? ? ?

## 2021-08-22 ENCOUNTER — Other Ambulatory Visit: Payer: Self-pay

## 2021-08-22 DIAGNOSIS — E782 Mixed hyperlipidemia: Secondary | ICD-10-CM

## 2021-08-22 MED ORDER — ROSUVASTATIN CALCIUM 20 MG PO TABS: ORAL_TABLET | ORAL | 3 refills | Status: DC

## 2021-08-29 NOTE — Progress Notes (Signed)
? ? ?Electrophysiology Office Note ?Date: 09/05/2021 ? ?ID:  [name], DOB 1930/08/24, MRN [id number] ? ?PCP: Unk Pinto, MD ?Primary Cardiologist: None ?Electrophysiologist: Vickie Epley, MD  ? ?CC: Pacemaker follow-up ? ?[name] is a 88 y.o. male seen today for Vickie Epley, MD for routine electrophysiology followup.  Since last being seen in our clinic the patient reports doing well. He was put on 40 mg daily lasix by his PCP and edema has much improved. His daughter states they had lost his wife recently, and visiting, standing on his feet, and eating irregularly was also likely contributing.  he denies chest pain, palpitations, dyspnea, PND, orthopnea, nausea, vomiting, dizziness, syncope, weight gain, or early satiety. ? ?Device History: ?ILR 02/2021 -> extracted and paced 06/2021 ?Medtronic Dual Chamber PPM implanted 06/2021 for symptomatic bradycardia ? ?Past Medical History:  ?Diagnosis Date  ? Allergy   ? Asthma   ? BPH (benign prostatic hypertrophy)   ? BPH (benign prostatic hypertrophy)   ? COPD (chronic obstructive pulmonary disease) ([place])   ? DJD (degenerative joint disease)   ? GERD (gastroesophageal reflux disease)   ? History of urinary retention   ? Hypertension   ? IBS (irritable bowel syndrome)   ? RBBB   ? Rheumatoid arthritis(714.0)   ? DR. HAWKES  ? Scrotal lesion   ? Short of breath on exertion   ? ?Past Surgical History:  ?Procedure Laterality Date  ? CARDIOVASCULAR STRESS TEST  04-26-2011  DR BRACKBILL  ? LOW RISK STRESS NUCLEAR STUDY/ NO EVIDENCE ISCHEMIA  ? LOOP RECORDER INSERTION N/A 03/07/2021  ? Procedure: LOOP RECORDER INSERTION;  Surgeon: Vickie Epley, MD;  Location: [place] [facility];  Service: Cardiovascular;  Laterality: N/A;  ? LOOP RECORDER REMOVAL N/A 07/03/2021  ? Procedure: LOOP RECORDER REMOVAL;  Surgeon: Evans Lance, MD;  Location: [place] [facility];  Service: Cardiovascular;  Laterality: N/A;  ? LUMBAR FUSION  X3  LAST ONE 1996  ?  PACEMAKER IMPLANT N/A 07/03/2021  ? Procedure: PACEMAKER IMPLANT;  Surgeon: Evans Lance, MD;  Location: [place] [facility];  Service: Cardiovascular;  Laterality: N/A;  ? SCROTAL EXPLORATION Left 07/17/2012  ? Procedure:  EXCISION  OF LEFT SCROTAL SKIN LESION;  Surgeon: Molli Hazard, MD;  Location: [facility];  Service: Urology;  Laterality: Left;  ? TRANSTHORACIC ECHOCARDIOGRAM  04-26-2011    ? MILD LVH/ LVSF NORMAL/ EF 28-76%/ GRADE I DIASTOLIC DYSFUNCTION/ MILD MITRAL REGURG.  ? TRANSURETHRAL RESECTION OF PROSTATE  SEVERAL YRS AGO  ? ? ?Current Outpatient Medications  ?Medication Sig Dispense Refill  ? Cholecalciferol (VITAMIN D) 125 MCG (5000 UT) CAPS Take 5,000 Units by mouth as directed. T,Th,Sat,Sun 1 per day ?2 per day other days of the week    ? doxycycline (VIBRAMYCIN) 100 MG capsule Take  1 capsule  2 x /day  with Food for Abscess in Buttock 60 capsule 0  ? finasteride (PROSCAR) 5 MG tablet Take  1 tablet  Daily  for Prostate 90 tablet 3  ? furosemide (LASIX) 40 MG tablet Take  1 tablet  Daily  for Fluid Retention / Edema 90 tablet 3  ? magnesium gluconate (MAGONATE) 500 MG tablet Take 500 mg by mouth daily.    ? olmesartan (BENICAR) 20 MG tablet Take 1 tablet  Daily  for BP 90 tablet 3  ? OVER THE COUNTER MEDICATION 1 capsule daily. Naturebell Zinc Quercitin With Vitamin C and D3 (130 mg )    ?  predniSONE (DELTASONE) 5 MG tablet Take 1 tablet 2 x / day or as directed for Asthma & Rheumatoid Arthritis (Patient taking differently: Take 10 mg by mouth daily.) 180 tablet 1  ? rosuvastatin (CRESTOR) 20 MG tablet Take  1 tablet  Daily  for Cholesterol 90 tablet 3  ? terazosin (HYTRIN) 10 MG capsule Take  1 capsule  at Bedtime  for Prostate (Patient taking differently: Take 10 mg by mouth at bedtime. Take  1 capsule  at Bedtime  for Prostate) 90 capsule 3  ? terbinafine (LAMISIL) 250 MG tablet Take 1 tablet Daily for ToeNail Fungus & Athlete's Foot 90 tablet 1  ? vitamin B-12  (CYANOCOBALAMIN) 1000 MCG tablet Take 1,000 mcg by mouth 3 (three) times a week.    ? ?No current facility-administered medications for this visit.  ? ? ?Allergies:   Ace inhibitors, Asa [aspirin], Penicillins, Vasotec [enalapril], and Clindamycin/lincomycin  ? ?Social History: ?Social History  ? ?Socioeconomic History  ? Marital status: Married  ?  Spouse name: Not on file  ? Number of children: Not on file  ? Years of education: Not on file  ? Highest education level: Not on file  ?Occupational History  ? Occupation: RETIRED  ?  Employer: RETIRED  ?Tobacco Use  ? Smoking status: Former  ?  Packs/day: 1.00  ?  Years: 30.00  ?  Pack years: 30.00  ?  Types: Cigarettes  ?  Quit date: 07/11/1980  ?  Years since quitting: 41.1  ? Smokeless tobacco: Current  ?  Types: Chew  ?Vaping Use  ? Vaping Use: Never used  ?Substance and Sexual Activity  ? Alcohol use: No  ? Drug use: No  ? Sexual activity: Not on file  ?Other Topics Concern  ? Not on file  ?Social History Narrative  ? Not on file  ? ?Social Determinants of Health  ? ?Financial Resource Strain: Not on file  ?Food Insecurity: Not on file  ?Transportation Needs: Not on file  ?Physical Activity: Not on file  ?Stress: Not on file  ?Social Connections: Not on file  ?Intimate Partner Violence: Not on file  ? ? ?Family History: ?Family History  ?Problem Relation Age of Onset  ? Heart disease Father   ? Heart disease Brother   ? Colon cancer Brother 29  ? ? ? ?Review of Systems: ?All other systems reviewed and are otherwise negative except as noted above. ? ?Physical Exam: ?Vitals:  ? 09/05/21 1218  ?BP: 134/62  ?Pulse: 76  ?SpO2: 95%  ?Weight: 272 lb (123.4 kg)  ?Height: '6\' 4"'$  (1.93 m)  ?  ? ?Wt Readings from Last 3 Encounters:  ?09/05/21 272 lb (123.4 kg)  ?08/16/21 277 lb 9.6 oz (125.9 kg)  ?07/13/21 278 lb (126.1 kg)  ? ? ? ?GEN- The patient is well appearing, alert and oriented x 3 today.   ?HEENT: normocephalic, atraumatic; sclera clear, conjunctiva pink; hearing  intact; oropharynx clear; neck supple  ?Lungs- Clear to ausculation bilaterally, normal work of breathing.  No wheezes, rales, rhonchi ?Heart- Regular rate and rhythm, no murmurs, rubs or gallops  ?GI- soft, non-tender, non-distended, bowel sounds present  ?Extremities- no clubbing or cyanosis. Trace ankle edema, L > R ?MS- no significant deformity or atrophy ?Skin- warm and dry, no rash or lesion; PPM pocket well healed ?Psych- euthymic mood, full affect ?Neuro- strength and sensation are intact ? ?PPM Interrogation- reviewed in detail today,  See PACEART report ? ?EKG:  EKG is not ordered today. ? ?  Recent Labs: ?07/02/2021: B Natriuretic Peptide 143.8; TSH 0.741 ?07/13/2021: Magnesium 2.1 ?08/16/2021: ALT 20; BUN 14; Creat 1.13; Hemoglobin 13.2; Platelets 237; Potassium 4.2; Sodium 141  ? ?Wt Readings from Last 3 Encounters:  ?09/05/21 272 lb (123.4 kg)  ?08/16/21 277 lb 9.6 oz (125.9 kg)  ?07/13/21 278 lb (126.1 kg)  ?  ? ?Other studies Reviewed: ?Additional studies/ records that were reviewed today include: Previous EP office notes, Previous remote checks, Most recent labwork.  ? ?Assessment and Plan: ? ?1. Symptomatic bradycardia s/p Medtronic PPM  ?Normal PPM function ?See Claudia Desanctis Art report ?No changes today ? ?2. Chronic diastolic CHF ?Volume status looks great centrally.  ?Continue lasix 40 mg daily.  He certainly does not need more, and may need less.  ?We discussed "sliding scale" diuretics at length.  They wish to make no changes until after seeing Dr. Melford Aase.  He may only need lasix 40 mg every other day or less often, as few as a couple of times week. They will try to hold it for a day or two after seeing Dr. Melford Aase and gauge his response.  ?Encouraged limiting fluid and salt.  ?Encouraged daily weights.  ?Labwork deferred to PCP visit tomorrow. Should get at least BMET (Cr,K) ? ?Current medicines are reviewed at length with the patient today.   ? ?Disposition:   Follow up with Dr. Quentin Ore in  as scheduled.     ? ?Signed, ?Shirley Friar, PA-C  ?09/05/2021 ?12:26 PM ? ?[facility] ?[address] ?[address] ?[place] [address] ?[phone] (office) ?[phone] (fax)  ?

## 2021-09-05 ENCOUNTER — Ambulatory Visit (INDEPENDENT_AMBULATORY_CARE_PROVIDER_SITE_OTHER): Payer: PPO | Admitting: Student

## 2021-09-05 ENCOUNTER — Encounter: Payer: Self-pay | Admitting: Student

## 2021-09-05 VITALS — BP 134/62 | HR 76 | Ht 76.0 in | Wt 272.0 lb

## 2021-09-05 DIAGNOSIS — R55 Syncope and collapse: Secondary | ICD-10-CM

## 2021-09-05 DIAGNOSIS — R0600 Dyspnea, unspecified: Secondary | ICD-10-CM | POA: Diagnosis not present

## 2021-09-05 DIAGNOSIS — R001 Bradycardia, unspecified: Secondary | ICD-10-CM

## 2021-09-05 NOTE — Patient Instructions (Signed)

## 2021-09-06 ENCOUNTER — Ambulatory Visit: Payer: PPO | Admitting: Internal Medicine

## 2021-09-06 ENCOUNTER — Encounter: Payer: Self-pay | Admitting: Internal Medicine

## 2021-09-06 VITALS — BP 118/60 | HR 71 | Temp 97.7°F | Ht 77.0 in | Wt 270.2 lb

## 2021-09-06 DIAGNOSIS — I10 Essential (primary) hypertension: Secondary | ICD-10-CM

## 2021-09-06 DIAGNOSIS — Z79899 Other long term (current) drug therapy: Secondary | ICD-10-CM

## 2021-09-06 DIAGNOSIS — L0231 Cutaneous abscess of buttock: Secondary | ICD-10-CM

## 2021-09-06 DIAGNOSIS — R609 Edema, unspecified: Secondary | ICD-10-CM

## 2021-09-06 NOTE — Progress Notes (Signed)
? ? ?Future Appointments  ?Date Time Provider Department  ?09/06/2021  4:30 PM Unk Pinto, MD [facility]  ?10/04/2021  2:15 PM Vickie Epley, MD [facility]  ?10/25/2021       6 Mo OV+Wellness   2:30 PM Liane Comber, NP [facility]  ?11/22/2021 11:00 AM Carleene Mains, RPH [facility]  ?04/25/2022           CPE  3:00 PM Unk Pinto, MD [facility]  ? ? ?History of Present Illness: ? ?    Patient is a very nice 88 yo MWM for 3 week  f/u of a Left gluteal abscess treated with Doxycycline.  He returns feeling that the  sore "lump" in his Left Buttock has resolved. He was also initiated on Lamisil  for Onychomycosis . Note weight is down 7# since starting Lasix.  ? ? ? ? ?Medications ? ?Current Outpatient Medications (Endocrine & Metabolic):  ?  predniSONE  5 MG tablet, Take 1 tablet 2 x / day or as directed for Asthma & Rheumatoid Arthritis  ?  furosemide (LASIX) 40 MG tablet, Take  1 tablet  Daily  for Fluid Retention / Edema ?  olmesartan (BENICAR) 20 MG tablet, Take 1 tablet  Daily  for BP ?  rosuvastatin (CRESTOR) 20 MG tablet, Take  1 tablet  Daily  for Cholesterol ?  terazosin (HYTRIN) 10 MG capsule, Take  1 capsule  at Bedtime  for Prostate (Patient taking differently: Take 10 mg by mouth at bedtime. Take  1 capsule  at Bedtime  for Prostate) ?  vitamin B-12 (CYANOCOBALAMIN) 1000 MCG tablet, Take 1,000 mcg by mouth 3 (three) times a week. ?  Cholecalciferol (VITAMIN D) 125 MCG (5000 UT) CAPS, Take 5,000 Units by mouth as directed. T,Th,Sat,Sun 1 per day 2 per day other days of the week ?  doxycycline (VIBRAMYCIN) 100 MG capsule, Take  1 capsule  2 x /day  with Food for Abscess in Buttock ?  finasteride (PROSCAR) 5 MG tablet, Take  1 tablet  Daily  for Prostate ?  magnesium 500 MG tablet, Take daily. ?  OVER THE COUNTER MEDICATION, 1 capsule daily. Naturebell Zinc Quercitin With Vitamin C and D3 (130 mg ) ?  terbinafine (LAMISIL) 250 MG tablet, Take 1 tablet Daily for ToeNail Fungus & Athlete's  Foot ? ?Problem list ?He has Rheumatoid arthritis ([place]); GERD (gastroesophageal reflux disease); DJD (degenerative joint disease); IBS (irritable bowel syndrome); COPD (chronic obstructive pulmonary disease) ([place]); Benign prostatic hyperplasia; Asthma; Essential hypertension; Mixed hyperlipidemia; Other abnormal glucose; Vitamin D deficiency; Medication management; Morbid obesity ([place])- BMI 30+ with COPD; Aortic atherosclerosis ([place]) by CT scan 2011; Syncope; Bradycardia; History of skin cancer; B12 deficiency; COVID-19 virus infection; AKI (acute kidney injury) ([place]); and Heart block on their problem list. ?  ?Observations/Objective: ? ?BP 118/60   Pulse 71   Temp 97.7 ?F (36.5 ?C)   Ht '6\' 5"'$  (1.956 m)   Wt 270 lb 3.2 oz (122.6 kg)   SpO2 98%   BMI 32.04 kg/m?  ? ?Exam of Left Buttock finds no "lumps" or sore /tender areas today .  ? ? ?Assessment and Plan: ? ?1. Abscess of buttock, left - resolved ? ?- OK'd to D/C Abx's.  ? ? ? ? ?Follow Up Instructions: ? ?  ?    I discussed the assessment and treatment plan with the patient. The patient was provided an opportunity to ask questions and all were answered. The patient agreed with the plan and demonstrated an  understanding of the instructions. ?  ?    The patient was advised to call back or seek an in-person evaluation if the symptoms worsen or if the condition fails to improve as anticipated. ? ? ? ?Kirtland Bouchard, MD ? ?

## 2021-09-13 ENCOUNTER — Other Ambulatory Visit: Payer: PPO

## 2021-09-13 DIAGNOSIS — I10 Essential (primary) hypertension: Secondary | ICD-10-CM

## 2021-09-13 DIAGNOSIS — R609 Edema, unspecified: Secondary | ICD-10-CM

## 2021-09-13 DIAGNOSIS — Z79899 Other long term (current) drug therapy: Secondary | ICD-10-CM

## 2021-09-14 LAB — COMPLETE METABOLIC PANEL WITH GFR
AG Ratio: 1.3 (calc) (ref 1.0–2.5)
ALT: 21 U/L (ref 9–46)
AST: 20 U/L (ref 10–35)
Albumin: 3.6 g/dL (ref 3.6–5.1)
Alkaline phosphatase (APISO): 65 U/L (ref 35–144)
BUN: 18 mg/dL (ref 7–25)
CO2: 35 mmol/L — ABNORMAL HIGH (ref 20–32)
Calcium: 8.9 mg/dL (ref 8.6–10.3)
Chloride: 103 mmol/L (ref 98–110)
Creat: 1.18 mg/dL (ref 0.70–1.22)
Globulin: 2.7 g/dL (calc) (ref 1.9–3.7)
Glucose, Bld: 102 mg/dL — ABNORMAL HIGH (ref 65–99)
Potassium: 3.8 mmol/L (ref 3.5–5.3)
Sodium: 144 mmol/L (ref 135–146)
Total Bilirubin: 0.6 mg/dL (ref 0.2–1.2)
Total Protein: 6.3 g/dL (ref 6.1–8.1)
eGFR: 59 mL/min/{1.73_m2} — ABNORMAL LOW (ref >=60)

## 2021-09-14 LAB — CBC WITH DIFFERENTIAL/PLATELET
Absolute Monocytes: 832 cells/uL (ref 200–950)
Basophils Absolute: 38 cells/uL (ref 0–200)
Basophils Relative: 0.3 %
Eosinophils Absolute: 25 cells/uL (ref 15–500)
Eosinophils Relative: 0.2 %
HCT: 41.2 % (ref 38.5–50.0)
Hemoglobin: 13.7 g/dL (ref 13.2–17.1)
Lymphs Abs: 1688 cells/uL (ref 850–3900)
MCH: 31.1 pg (ref 27.0–33.0)
MCHC: 33.3 g/dL (ref 32.0–36.0)
MCV: 93.6 fL (ref 80.0–100.0)
MPV: 10.3 fL (ref 7.5–12.5)
Monocytes Relative: 6.6 %
Neutro Abs: 10017 cells/uL — ABNORMAL HIGH (ref 1500–7800)
Neutrophils Relative %: 79.5 %
Platelets: 198 10*3/uL (ref 140–400)
RBC: 4.4 10*6/uL (ref 4.20–5.80)
RDW: 12.8 % (ref 11.0–15.0)
Total Lymphocyte: 13.4 %
WBC: 12.6 10*3/uL — ABNORMAL HIGH (ref 3.8–10.8)

## 2021-09-14 NOTE — Progress Notes (Signed)
<><><><><><><><><><><><><><><><><><><><><><><><><><><><><><><><><> ?<><><><><><><><><><><><><><><><><><><><><><><><><><><><><><><><><> ?-   Test results slightly outside the reference range are not unusual. ?If there is anything important, I will review this with you,  ?otherwise it is considered normal test values.  ?If you have further questions,  ?please do not hesitate to contact me at the office or via My Chart.  ?<><><><><><><><><><><><><><><><><><><><><><><><><><><><><><><><><> ?<><><><><><><><><><><><><><><><><><><><><><><><><><><><><><><><><> ? ?-  CBC returned OK - Shows Normal Hgb - [place] -  ( ie , No Anemia )   ? ?- WBC is only very slightly elevated related to recent infection   ?                                                                 ( Infected Sebaceous cyst or oil gland )  ?<><><><><><><><><><><><><><><><><><><><><><><><><><><><><><><><><> ? ?- Kidney functions are great  -  Creatinine actually a little better than 2 months ago. So ? ?              Does not appear dehydrated or volume depleted  ?<><><><><><><><><><><><><><><><><><><><><><><><><><><><><><><><><> ? ? ? ?

## 2021-10-03 ENCOUNTER — Ambulatory Visit (INDEPENDENT_AMBULATORY_CARE_PROVIDER_SITE_OTHER): Payer: PPO

## 2021-10-03 DIAGNOSIS — I459 Conduction disorder, unspecified: Secondary | ICD-10-CM

## 2021-10-04 ENCOUNTER — Encounter: Payer: Self-pay | Admitting: Cardiology

## 2021-10-04 ENCOUNTER — Ambulatory Visit: Payer: PPO | Admitting: Cardiology

## 2021-10-04 VITALS — BP 120/68 | HR 72 | Ht 77.0 in | Wt 273.2 lb

## 2021-10-04 DIAGNOSIS — R001 Bradycardia, unspecified: Secondary | ICD-10-CM | POA: Diagnosis not present

## 2021-10-04 DIAGNOSIS — I442 Atrioventricular block, complete: Secondary | ICD-10-CM | POA: Diagnosis not present

## 2021-10-04 DIAGNOSIS — Z950 Presence of cardiac pacemaker: Secondary | ICD-10-CM | POA: Diagnosis not present

## 2021-10-04 DIAGNOSIS — R55 Syncope and collapse: Secondary | ICD-10-CM | POA: Diagnosis not present

## 2021-10-04 LAB — CUP PACEART REMOTE DEVICE CHECK
Battery Remaining Longevity: 142 mo
Battery Voltage: 3.18 V
Brady Statistic AP VP Percent: 20.58 %
Brady Statistic AP VS Percent: 0.02 %
Brady Statistic AS VP Percent: 78.83 %
Brady Statistic AS VS Percent: 0.58 %
Brady Statistic RA Percent Paced: 20.4 %
Brady Statistic RV Percent Paced: 99.41 %
Date Time Interrogation Session: 20230521214404
Implantable Lead Implant Date: 20230219
Implantable Lead Implant Date: 20230219
Implantable Lead Location: 753859
Implantable Lead Location: 753860
Implantable Lead Model: 3830
Implantable Lead Model: 5076
Implantable Pulse Generator Implant Date: 20230219
Lead Channel Impedance Value: 342 Ohm
Lead Channel Impedance Value: 399 Ohm
Lead Channel Impedance Value: 513 Ohm
Lead Channel Impedance Value: 532 Ohm
Lead Channel Pacing Threshold Amplitude: 0.75 V
Lead Channel Pacing Threshold Amplitude: 1.125 V
Lead Channel Pacing Threshold Pulse Width: 0.4 ms
Lead Channel Pacing Threshold Pulse Width: 0.4 ms
Lead Channel Sensing Intrinsic Amplitude: 12.5 mV
Lead Channel Sensing Intrinsic Amplitude: 12.5 mV
Lead Channel Sensing Intrinsic Amplitude: 3.75 mV
Lead Channel Sensing Intrinsic Amplitude: 3.75 mV
Lead Channel Setting Pacing Amplitude: 1.5 V
Lead Channel Setting Pacing Amplitude: 2.25 V
Lead Channel Setting Pacing Pulse Width: 0.4 ms
Lead Channel Setting Sensing Sensitivity: 1.2 mV

## 2021-10-04 NOTE — Patient Instructions (Signed)
Medication Instructions:  Your physician recommends that you continue on your current medications as directed. Please refer to the Current Medication list given to you today. *If you need a refill on your cardiac medications before your next appointment, please call your pharmacy*  Lab Work: None. If you have labs (blood work) drawn today and your tests are completely normal, you will receive your results only by: [place] (if you have MyChart) OR A paper copy in the mail If you have any lab test that is abnormal or we need to change your treatment, we will call you to review the results.  Testing/Procedures: None.  Follow-Up: At [facility], you and your health needs are our priority.  As part of our continuing mission to provide you with exceptional heart care, we have created designated Provider Care Teams.  These Care Teams include your primary Cardiologist (physician) and Advanced Practice Providers (APPs -  Physician Assistants and Nurse Practitioners) who all work together to provide you with the care you need, when you need it.  Your physician wants you to follow-up in: 12 months  one of the following Advanced Practice Providers on your designated Care Team:    Tommye Standard, PA-C Legrand Como "Jonni Sanger" [place], [place]   You will receive a reminder letter in the mail two months in advance. If you don't receive a letter, please call our office to schedule the follow-up appointment.  We recommend signing up for the patient portal called "MyChart".  Sign up information is provided on this After Visit Summary.  MyChart is used to connect with patients for Virtual Visits (Telemedicine).  Patients are able to view lab/test results, encounter notes, upcoming appointments, etc.  Non-urgent messages can be sent to your provider as well.   To learn more about what you can do with MyChart, go to [email or web address].    Any Other Special Instructions Will Be Listed Below (If  Applicable).

## 2021-10-04 NOTE — Progress Notes (Signed)
Electrophysiology Office Note:    Date:  10/04/2021   ID:  [name], DOB 1930/07/30, MRN [id number]  PCP:  Unk Pinto, MD  [facility] Cardiologist:  None  [facility] Electrophysiologist:  Vickie Epley, MD   Referring MD: Unk Pinto, MD   Chief Complaint: Follow-up of MDT PPM  History of Present Illness:    [name] is a 88 y.o. male who presents for follow-up of his MDT PPM implanted on 07/03/2021. Their medical history includes RBBB, hypertension, rheumatoid arthritis, asthma, COPD, GERD, and DJD.  Prior ILR implanted on 02/2021. This was extracted and Medtronic Dual Chamber PPM was implanted 06/2021 for symptomatic bradycardia.  Last seen by Barrington Ellison, PA-C on 09/05/2021. He had recently been started on 40 mg Lasix daily by his PCP with improvement in his edema. Normal PPM function.  He is accompanied by a family member. Overall, he appears well. He denies any pain associated with his device.   He continues to take Lasix 40 mg daily. Not on potassium supplements (3.8 as of 09/13/21). He weighs himself daily, with consistent weight around 167-170 lbs.  Lately he has not been active. He is suffering from back pain and weakness which he attributes to muscle spasms and fatigue. While walking he does not use a cane. He denies any falls, but his family member states that he has come close to falling.  He denies any palpitations, chest pain, shortness of breath, or peripheral edema. No lightheadedness, headaches, syncope, orthopnea, or PND.     Past Medical History:  Diagnosis Date   Allergy    Asthma    BPH (benign prostatic hypertrophy)    BPH (benign prostatic hypertrophy)    COPD (chronic obstructive pulmonary disease) ([facility])    DJD (degenerative joint disease)    GERD (gastroesophageal reflux disease)    History of urinary retention    Hypertension    IBS (irritable bowel syndrome)    RBBB    Rheumatoid arthritis(714.0)    DR. HAWKES    Scrotal lesion    Short of breath on exertion     Past Surgical History:  Procedure Laterality Date   CARDIOVASCULAR STRESS TEST  04-26-2011  DR BRACKBILL   LOW RISK STRESS NUCLEAR STUDY/ NO EVIDENCE ISCHEMIA   LOOP RECORDER INSERTION N/A 03/07/2021   Procedure: LOOP RECORDER INSERTION;  Surgeon: Vickie Epley, MD;  Location: [place] [facility];  Service: Cardiovascular;  Laterality: N/A;   LOOP RECORDER REMOVAL N/A 07/03/2021   Procedure: LOOP RECORDER REMOVAL;  Surgeon: Evans Lance, MD;  Location: [place] [facility];  Service: Cardiovascular;  Laterality: N/A;   LUMBAR FUSION  X3  LAST ONE 1996   PACEMAKER IMPLANT N/A 07/03/2021   Procedure: PACEMAKER IMPLANT;  Surgeon: Evans Lance, MD;  Location: [place] [facility];  Service: Cardiovascular;  Laterality: N/A;   SCROTAL EXPLORATION Left 07/17/2012   Procedure:  EXCISION  OF LEFT SCROTAL SKIN LESION;  Surgeon: Molli Hazard, MD;  [facility];  Service: Urology;  Laterality: Left;   TRANSTHORACIC ECHOCARDIOGRAM  04-26-2011     MILD LVH/ LVSF NORMAL/ EF 91-50%/ GRADE I DIASTOLIC DYSFUNCTION/ MILD MITRAL REGURG.   TRANSURETHRAL RESECTION OF PROSTATE  SEVERAL YRS AGO    Current Medications: Current Meds  Medication Sig   Cholecalciferol (VITAMIN D) 125 MCG (5000 UT) CAPS Take 5,000 Units by mouth as directed. T,Th,Sat,Sun 1 per day 2 per day other days of the week  finasteride (PROSCAR) 5 MG tablet Take  1 tablet  Daily  for Prostate   furosemide (LASIX) 40 MG tablet Take  1 tablet  Daily  for Fluid Retention / Edema   magnesium gluconate (MAGONATE) 500 MG tablet Take 500 mg by mouth daily.   olmesartan (BENICAR) 20 MG tablet Take 1 tablet  Daily  for BP   OVER THE COUNTER MEDICATION 1 capsule daily. Naturebell Zinc Quercitin With Vitamin C and D3 (130 mg )   predniSONE (DELTASONE) 5 MG tablet Take 1 tablet 2 x / day or as directed for Asthma & Rheumatoid Arthritis   rosuvastatin (CRESTOR) 20  MG tablet Take  1 tablet  Daily  for Cholesterol   terazosin (HYTRIN) 10 MG capsule Take  1 capsule  at Bedtime  for Prostate   vitamin B-12 (CYANOCOBALAMIN) 1000 MCG tablet Take 1,000 mcg by mouth 3 (three) times a week.     Allergies:   Ace inhibitors, Asa [aspirin], Penicillins, Vasotec [enalapril], and Clindamycin/lincomycin   Social History   Socioeconomic History   Marital status: Married    Spouse name: Not on file   Number of children: Not on file   Years of education: Not on file   Highest education level: Not on file  Occupational History   Occupation: RETIRED    Employer: RETIRED  Tobacco Use   Smoking status: Former    Packs/day: 1.00    Years: 30.00    Pack years: 30.00    Types: Cigarettes    Quit date: 07/11/1980    Years since quitting: 41.2   Smokeless tobacco: Current    Types: Chew  Vaping Use   Vaping Use: Never used  Substance and Sexual Activity   Alcohol use: No   Drug use: No   Sexual activity: Not on file  Other Topics Concern   Not on file  Social History Narrative   Not on file   Social Determinants of Health   Financial Resource Strain: Not on file  Food Insecurity: Not on file  Transportation Needs: Not on file  Physical Activity: Not on file  Stress: Not on file  Social Connections: Not on file     Family History: The patient's family history includes Colon cancer (age of onset: 14) in his brother; Heart disease in his brother and father.  ROS:   Please see the history of present illness.    (+) Back pain and weakness (+) Fatigue All other systems reviewed and are negative.  EKGs/Labs/Other Studies Reviewed:    The following studies were reviewed today:  10/04/2021  In clinic device interrogation personally reviewed. Lead parameters stable. Battery longevity 11.4 years Ventricular pacing 99.4%  07/03/2021  Echo Sonographer Comments: Suboptimal subcostal window.  IMPRESSIONS    1. Left ventricular ejection fraction, by  estimation, is 60 to 65%. The  left ventricle has normal function. The left ventricle has no regional  wall motion abnormalities. There is mild concentric left ventricular  hypertrophy. Left ventricular diastolic  parameters are consistent with Grade I diastolic dysfunction (impaired  relaxation).   2. Right ventricular systolic function is normal. The right ventricular  size is normal. Tricuspid regurgitation signal is inadequate for assessing  PA pressure.   3. The mitral valve is grossly normal. No evidence of mitral valve  regurgitation. No evidence of mitral stenosis.   4. The aortic valve is tricuspid. Aortic valve regurgitation is not  visualized. No aortic stenosis is present.   07/03/2021  Pacemaker Implant, Loop  Recorder Removal CONCLUSIONS:   1. Successful implantation of a Medtronic dual-chamber pacemaker for symptomatic bradycardia due to complete heart block  2. No early apparent complications.    EKG:   EKG is personally reviewed.  10/04/2021: Atrial sensed, ventricular paced.  QRS duration 116 ms Recent Labs: 07/02/2021: B Natriuretic Peptide 143.8; TSH 0.741 07/13/2021: Magnesium 2.1 09/13/2021: ALT 21; BUN 18; Creat 1.18; Hemoglobin 13.7; Platelets 198; Potassium 3.8; Sodium 144   Recent Lipid Panel    Component Value Date/Time   CHOL 106 04/20/2021 1625   TRIG 77 04/20/2021 1625   HDL 42 04/20/2021 1625   CHOLHDL 2.5 04/20/2021 1625   VLDL 45 (H) 06/07/2016 1055   LDLCALC 48 04/20/2021 1625    Physical Exam:    VS:  BP 120/68   Pulse 72   Ht '6\' 5"'$  (1.956 m)   Wt 273 lb 3.2 oz (123.9 kg)   BMI 32.40 kg/m     Wt Readings from Last 3 Encounters:  10/04/21 273 lb 3.2 oz (123.9 kg)  09/06/21 270 lb 3.2 oz (122.6 kg)  09/05/21 272 lb (123.4 kg)     GEN: Well nourished, well developed in no acute distress HEENT: Normal NECK: No JVD; No carotid bruits LYMPHATICS: No lymphadenopathy CARDIAC: RRR, no murmurs, rubs, gallops; Device pocket well  healed. RESPIRATORY:  Clear to auscultation without rales, wheezing or rhonchi  ABDOMEN: Soft, non-tender, non-distended MUSCULOSKELETAL:  No edema; No deformity  SKIN: Warm and dry NEUROLOGIC:  Alert and oriented x 3 PSYCHIATRIC:  Normal affect       ASSESSMENT:    1. Heart block AV complete ([place])   2. Syncope, unspecified syncope type   3. Bradycardia   4. Cardiac pacemaker in situ    PLAN:    In order of problems listed above:   #Complete heart block #Pacemaker in situ Device functioning appropriately.  Continue remote monitoring.  #Syncope No recurrent episodes.    Follow-up in 1 year or sooner as needed.  Medication Adjustments/Labs and Tests Ordered: Current medicines are reviewed at length with the patient today.  Concerns regarding medicines are outlined above.  No orders of the defined types were placed in this encounter.  No orders of the defined types were placed in this encounter.   I,Mathew Stumpf,acting as a Education administrator for Vickie Epley, MD.,have documented all relevant documentation on the behalf of Vickie Epley, MD,as directed by  Vickie Epley, MD while in the presence of Vickie Epley, MD.  I, Vickie Epley, MD, have reviewed all documentation for this visit. The documentation on 10/04/21 for the exam, diagnosis, procedures, and orders are all accurate and complete.   Signed, Hilton Cork. Quentin Ore, MD, [facility], [facility] 10/04/2021 2:54 PM    Electrophysiology [facility]

## 2021-10-05 NOTE — Addendum Note (Signed)
Addended by: Michelle Nasuti on: 10/05/2021 08:18 AM   Modules accepted: Orders

## 2021-10-19 NOTE — Progress Notes (Signed)
Remote pacemaker transmission.   

## 2021-10-25 ENCOUNTER — Encounter: Payer: Self-pay | Admitting: Adult Health

## 2021-10-25 ENCOUNTER — Ambulatory Visit (INDEPENDENT_AMBULATORY_CARE_PROVIDER_SITE_OTHER): Payer: PPO | Admitting: Adult Health

## 2021-10-25 VITALS — BP 129/68 | HR 91 | Temp 96.8°F | Wt 270.8 lb

## 2021-10-25 DIAGNOSIS — Z950 Presence of cardiac pacemaker: Secondary | ICD-10-CM

## 2021-10-25 DIAGNOSIS — N401 Enlarged prostate with lower urinary tract symptoms: Secondary | ICD-10-CM | POA: Diagnosis not present

## 2021-10-25 DIAGNOSIS — Z79899 Other long term (current) drug therapy: Secondary | ICD-10-CM | POA: Diagnosis not present

## 2021-10-25 DIAGNOSIS — R7309 Other abnormal glucose: Secondary | ICD-10-CM

## 2021-10-25 DIAGNOSIS — J45909 Unspecified asthma, uncomplicated: Secondary | ICD-10-CM

## 2021-10-25 DIAGNOSIS — I700 Atherosclerosis of aorta: Secondary | ICD-10-CM

## 2021-10-25 DIAGNOSIS — L570 Actinic keratosis: Secondary | ICD-10-CM | POA: Diagnosis not present

## 2021-10-25 DIAGNOSIS — B353 Tinea pedis: Secondary | ICD-10-CM

## 2021-10-25 DIAGNOSIS — L821 Other seborrheic keratosis: Secondary | ICD-10-CM | POA: Diagnosis not present

## 2021-10-25 DIAGNOSIS — E782 Mixed hyperlipidemia: Secondary | ICD-10-CM | POA: Diagnosis not present

## 2021-10-25 DIAGNOSIS — Z0000 Encounter for general adult medical examination without abnormal findings: Secondary | ICD-10-CM

## 2021-10-25 DIAGNOSIS — M545 Low back pain, unspecified: Secondary | ICD-10-CM

## 2021-10-25 DIAGNOSIS — Z85828 Personal history of other malignant neoplasm of skin: Secondary | ICD-10-CM | POA: Diagnosis not present

## 2021-10-25 DIAGNOSIS — L814 Other melanin hyperpigmentation: Secondary | ICD-10-CM | POA: Diagnosis not present

## 2021-10-25 DIAGNOSIS — E538 Deficiency of other specified B group vitamins: Secondary | ICD-10-CM | POA: Diagnosis not present

## 2021-10-25 DIAGNOSIS — I10 Essential (primary) hypertension: Secondary | ICD-10-CM

## 2021-10-25 DIAGNOSIS — L820 Inflamed seborrheic keratosis: Secondary | ICD-10-CM | POA: Diagnosis not present

## 2021-10-25 DIAGNOSIS — I442 Atrioventricular block, complete: Secondary | ICD-10-CM

## 2021-10-25 DIAGNOSIS — M0579 Rheumatoid arthritis with rheumatoid factor of multiple sites without organ or systems involvement: Secondary | ICD-10-CM

## 2021-10-25 DIAGNOSIS — B351 Tinea unguium: Secondary | ICD-10-CM

## 2021-10-25 DIAGNOSIS — R6889 Other general symptoms and signs: Secondary | ICD-10-CM | POA: Diagnosis not present

## 2021-10-25 DIAGNOSIS — E559 Vitamin D deficiency, unspecified: Secondary | ICD-10-CM | POA: Diagnosis not present

## 2021-10-25 DIAGNOSIS — K219 Gastro-esophageal reflux disease without esophagitis: Secondary | ICD-10-CM

## 2021-10-25 DIAGNOSIS — J449 Chronic obstructive pulmonary disease, unspecified: Secondary | ICD-10-CM | POA: Diagnosis not present

## 2021-10-25 DIAGNOSIS — C4441 Basal cell carcinoma of skin of scalp and neck: Secondary | ICD-10-CM | POA: Diagnosis not present

## 2021-10-25 DIAGNOSIS — Z0001 Encounter for general adult medical examination with abnormal findings: Secondary | ICD-10-CM

## 2021-10-25 DIAGNOSIS — K589 Irritable bowel syndrome without diarrhea: Secondary | ICD-10-CM

## 2021-10-25 MED ORDER — TERBINAFINE HCL 250 MG PO TABS: ORAL_TABLET | ORAL | 0 refills | Status: DC

## 2021-10-25 NOTE — Progress Notes (Signed)
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 Call if greater than 130/80.   Chronic obstructive pulmonary disease, unspecified COPD type ([place]) Pulm follows, on low dose prednisone Failed numerous inhalers;  Remote smoker; normal CXR 02/2021 Declines further med changes at this visit   Rheumatoid arthritis involving multiple sites with positive rheumatoid factor ([place]) Continue follow up, low dose prednisone   Morbid Obesity ([place]) - BMI 30+ with COPD - follow up 3 months for progress monitoring - increase veggies, decrease carbs - long discussion about weight loss, diet, and exercise  Mixed hyperlipidemia -continue medications, check lipids, decrease fatty foods, increase activity.   Prediabetes Discussed disease and risks Discussed diet/exercise, weight management  Check A1C q61m;   Medication management CBC, CMP/GFR, magnesium  Vitamin D deficiency Continue supplement  Benign prostatic hyperplasia with lower urinary tract symptoms, symptom details unspecified Monitor symptoms; controlled on finasteride and hytrin  Irritable bowel syndrome, unspecified type Encourage soluble fiber; monitor  Gastroesophageal reflux disease, esophagitis presence not specified Well managed by lifestyle Discussed diet, avoiding triggers and other lifestyle changes  Uncomplicated asthma, unspecified asthma severity, unspecified whether persistent Has seen pulm, denies wheezing on low dose prednisone  Syncope Denies recurrence since pacemaker   B12 deficiency  - newly on supplement  - B12  Lumbar back pain Chronic, without radicular sx Appears muscular in etiology Declines  medications, imaging or PT at this time Encouraged weight loss, regular gentle exercises, walking He can try tylenol, heat if needed -   Onychomycosis - sent in 90 day course of lamisil to restart - recheck LFTs in 6 weeks by [place]  Orders Placed This Encounter  Procedures   CBC with Differential/Platelet   COMPLETE METABOLIC PANEL WITH GFR   Magnesium   Lipid panel   TSH   Hemoglobin A1c   Vitamin B12    Over 30 minutes of exam, counseling, chart review, and critical decision making was performed  Future Appointments  Date Time Provider [place]  11/22/2021 11:00 AM Carleene Mains, RPH [facility] None  01/02/2022  7:05 AM [facility] DEVICE REMOTES [facility]  04/03/2022  7:05 AM [facility] DEVICE REMOTES [facility]  04/25/2022  3:00 PM Unk Pinto, MD [facility] None  07/03/2022  7:05 AM [facility] DEVICE REMOTES [facility]  10/02/2022  7:05 AM [facility] DEVICE REMOTES [facility]  10/26/2022  2:00 PM Alycia Rossetti, NP [facility] None     Plan:   During the course of the visit the patient was educated and counseled about appropriate screening and preventive services including:   Pneumococcal vaccine  Influenza vaccine Prevnar 13 Td vaccine Screening electrocardiogram Colorectal cancer screening Diabetes screening Glaucoma screening Nutrition counseling    Subjective:  [name] is a 88 y.o. male who presents for AWV and follow up. He has Rheumatoid arthritis ([place]); GERD (gastroesophageal reflux disease); DJD (degenerative joint disease); IBS (irritable bowel syndrome); COPD (chronic obstructive pulmonary disease) ([place]); Benign prostatic hyperplasia; Asthma; Essential hypertension; Mixed hyperlipidemia; Other abnormal glucose; Vitamin D deficiency; Medication management; Morbid obesity ([place])- BMI 30+ with COPD; Aortic atherosclerosis ([place]) by CT scan 2011; History of skin cancer; B12  deficiency; Heart block AV complete ([place]) - s/p PPM 07/03/2021; S/P placement of  cardiac pacemaker; and Lumbar back pain on their problem list.  He is recently widowed, 2 children, daughter Claiborne Billings is with him today, 5 grand kids, 8 great grands. He is a retired Administrator.   He had syncope, symptomatic brady/ CHB in 06/2021, was admitted for pacemaker placement on 07/03/2021 by Dr. Lars Mage and has done well since without syncopal episodes. He has had some edema, started on lasix 40 mg daily and has been stable since. ECHO 07/03/2021 showed LVEF 60-65%, mild concentric LVH, grade 1 diastolic dysfunction, no valvular abnormalities.   He is a former smoker, 30 pack years quit in 1982; he has COPD and asthma, has seen Dr. Londell Moh, on prednisone 5 mg daily for many years. He tried symbicort and stiolto with apprent lack of perceived benefit. He report stable exertional dyspnea.   He has seen Dr Gavin Pound for Rheumatoid Arthritis (2010) in the past, recently treatment by daily low dose steroid.   GERD and IBS are recently well controlled by diet.   Hx of SCC/BCC, follows with derm q26m   He reports Dr. MMelford Aaseprescribed terbinafine last visit, 90 days but pharmacy only gave him 328 does think this helped his sx.   BMI is Body mass index is 32.11 kg/m., he is not working on diet or exercise, has some back pain after walking a block. Denies radicular sx, not interested in treating or diagnosing this.  Wt Readings from Last 3 Encounters:  10/25/21 270 lb 12.8 oz (122.8 kg)  10/04/21 273 lb 3.2 oz (123.9 kg)  09/06/21 270 lb 3.2 oz (122.6 kg)   His blood pressure has been controlled at home, today their BP is BP: 129/68 He does not workout. He denies chest pain, shortness of breath, dizziness.   He has aortic atherosclerosis per CT in 2011.   He is on cholesterol medication and denies myalgias. His cholesterol is at goal. The cholesterol last visit was:   Lab Results  Component Value  Date   CHOL 106 04/20/2021   HDL 42 04/20/2021   LDLCALC 48 04/20/2021   TRIG 77 04/20/2021   CHOLHDL 2.5 04/20/2021   He has not been working on diet/exercise for prediabetes. He denies diabetic polys . Last A1C in the office was:  Lab Results  Component Value Date   HGBA1C 5.7 (H) 03/05/2021   Last GFR Lab Results  Component Value Date   EGFR 59 (L) 09/13/2021   EGFR 63 08/16/2021   EGFR 63 07/13/2021   Patient is on Vitamin D supplement, taking 5000 IU daily  Lab Results  Component Value Date   VD25OH 86 04/20/2021     He is newly taking 1000 mcg tab 3 days a week  Lab Results  Component Value Date   VITAMINB12 208 04/20/2021   He has BPH LUTs resolved, hx of TURP, he is on finasteride 5 mg and hytrin daily.  Lab Results  Component Value Date   PSA 0.28 04/20/2021   PSA 0.27 03/09/2020   PSA 0.3 02/13/2019       Medication Review: Current Outpatient Medications on File Prior to Visit  Medication Sig Dispense Refill   Cholecalciferol (VITAMIN D) 125 MCG (5000 UT) CAPS Take 5,000 Units by mouth as directed. T,Th,Sat,Sun 1 per day 2 per day other days of the week     finasteride (PROSCAR) 5 MG tablet Take  1 tablet  Daily  for Prostate 90 tablet 3   furosemide (LASIX) 40 MG tablet Take  1 tablet  Daily  for Fluid Retention / Edema 90 tablet 3   magnesium gluconate (MAGONATE) 500 MG tablet Take 500 mg by mouth daily.     olmesartan (BENICAR) 20 MG tablet Take 1 tablet  Daily  for BP 90 tablet 3   OVER THE COUNTER MEDICATION 1 capsule daily. [employer] Zinc Quercitin With Vitamin C and D3 (130 mg )     predniSONE (DELTASONE) 5 MG tablet Take 1 tablet 2 x / day or as directed for Asthma & Rheumatoid Arthritis 180 tablet 1   rosuvastatin (CRESTOR) 20 MG tablet Take  1 tablet  Daily  for Cholesterol 90 tablet 3   terazosin (HYTRIN) 10 MG capsule Take  1 capsule  at Bedtime  for Prostate 90 capsule 3   vitamin B-12 (CYANOCOBALAMIN) 1000 MCG tablet Take 1,000 mcg by  mouth 3 (three) times a week.     No current facility-administered medications on file prior to visit.    Allergies: Allergies  Allergen Reactions   Ace Inhibitors    Asa [Aspirin]     High Dose asprin   Penicillins Hives   Vasotec [Enalapril]     Cough   Clindamycin/Lincomycin     Hives     Current Problems (verified) has Rheumatoid arthritis ([facility]); GERD (gastroesophageal reflux disease); DJD (degenerative joint disease); IBS (irritable bowel syndrome); COPD (chronic obstructive pulmonary disease) ([place]); Benign prostatic hyperplasia; Asthma; Essential hypertension; Mixed hyperlipidemia; Other abnormal glucose; Vitamin D deficiency; Medication management; Morbid obesity ([place])- BMI 30+ with COPD; Aortic atherosclerosis ([place]) by CT scan 2011; History of skin cancer; B12 deficiency; Heart block AV complete ([place]) - s/p PPM 07/03/2021; S/P placement of cardiac pacemaker; and Lumbar back pain on their problem list.  Screening Tests Immunization History  Administered Date(s) Administered   DT (Pediatric) 04/15/2014   Influenza, High Dose Seasonal PF 04/15/2014, 02/16/2015, 02/18/2016, 02/04/2018, 03/09/2020, 03/11/2021   Influenza-Unspecified 03/10/2013, 02/07/2017, 02/13/2019   PFIZER(Purple Top)SARS-COV-2 Vaccination 05/27/2019, 06/15/2019, 03/17/2020   Pneumococcal Conjugate-13 04/15/2014   Pneumococcal Polysaccharide-23 05/15/2001, 03/30/2011   Td 05/15/2002   Zoster, Live 04/01/2012   Health Maintenance  Topic Date Due   Zoster Vaccines- Shingrix (1 of 2) Never done   COVID-19 Vaccine (4 - Booster for Pfizer series) 11/10/2021 (Originally 05/12/2020)   INFLUENZA VACCINE  12/13/2021   TETANUS/TDAP  04/15/2024   Pneumonia Vaccine 72+ Years old  Completed   HPV VACCINES  Aged Out   Last colonoscopy: unknown, age/declines another  Names of Other Physician/Practitioners you currently use: 1. [facility] here for primary care 2. Dr. Katy Fitch,  eye doctor, last visit 2018, wears glasses, encouraged to schedule 3. Dr. Rip Harbour, dentist, last visit 2022, goes q5m 4. Dr. Sarajane Jews, derm, last visit 2023, goes q63m, hx SCC/BCC  Patient Care Team: Unk Pinto, MD as PCP - General (Internal Medicine) Vickie Epley, MD as PCP - Electrophysiology (Cardiology) Newton Pigg, [facility] as Pharmacist (Pharmacist)  Surgical: He  has a past surgical history that includes Transurethral resection of prostate (SEVERAL YRS AGO); transthoracic echocardiogram (04-26-2011  ); Cardiovascular stress test (04-26-2011  DR BRACKBILL); Lumbar fusion (X3  LAST ONE 1996); Scrotal exploration (Left, 07/17/2012); LOOP RECORDER INSERTION (N/A, 03/07/2021); PACEMAKER IMPLANT (N/A, 07/03/2021); and LOOP RECORDER REMOVAL (N/A, 07/03/2021). Family His family history includes Colon cancer (age of onset: 60) in his brother; Heart disease in his brother and father. Social history  He reports that he quit smoking about 41 years ago. His smoking use included cigarettes. He  has a 30.00 pack-year smoking history. His smokeless tobacco use includes chew. He reports that he does not drink alcohol and does not use drugs.  MEDICARE WELLNESS OBJECTIVES: Physical activity: Current Exercise Habits: The patient does not participate in regular exercise at present, Exercise limited by: respiratory conditions(s) Cardiac risk factors: Cardiac Risk Factors include: advanced age (>66mn, >>53women);diabetes mellitus;dyslipidemia;hypertension;male gender;obesity (BMI >30kg/m2);sedentary lifestyle;smoking/ tobacco exposure Depression/mood screen:      10/25/2021    3:03 PM  Depression screen PHQ 2/9  Decreased Interest 0  Down, Depressed, Hopeless 0  PHQ - 2 Score 0    ADLs:     10/25/2021    3:01 PM 07/13/2021    9:29 PM  In your present state of health, do you have any difficulty performing the following activities:  Hearing? 0 0  Vision? 0 0  Difficulty concentrating or making  decisions? 0 0  Walking or climbing stairs? 0 0  Dressing or bathing? 0 0  Doing errands, shopping? 0 0     Cognitive Testing  Alert? Yes  Normal Appearance?Yes  Oriented to person? Yes  Place? Yes   Time? Yes  Recall of three objects?  Yes  Can perform simple calculations? Yes  Displays appropriate judgment?Yes  Can read the correct time from a watch face?Yes  EOL planning:        Review of Systems  Constitutional:  Negative for malaise/fatigue and weight loss.  HENT:  Negative for congestion, hearing loss, sore throat and tinnitus.   Eyes:  Negative for blurred vision and double vision.  Respiratory:  Positive for shortness of breath (chronic, exertional, unchanged). Negative for cough, sputum production and wheezing.   Cardiovascular:  Negative for chest pain, palpitations, orthopnea, claudication and leg swelling.  Gastrointestinal:  Negative for abdominal pain, blood in stool, constipation, diarrhea, heartburn, melena, nausea and vomiting.  Genitourinary: Negative.   Musculoskeletal:  Positive for back pain (chronic, lumbar). Negative for falls, joint pain and myalgias.  Skin:  Negative for rash.  Neurological:  Negative for dizziness, tingling, sensory change, focal weakness, weakness and headaches.  Endo/Heme/Allergies:  Negative for environmental allergies and polydipsia.  Psychiatric/Behavioral: Negative.  Negative for depression, memory loss and substance abuse. The patient is not nervous/anxious.   All other systems reviewed and are negative.    Objective:   Today's Vitals   10/25/21 1440  BP: 129/68  Pulse: 91  Temp: (!) 96.8 F (36 C)  SpO2: 97%  Weight: 270 lb 12.8 oz (122.8 kg)    Body mass index is 32.11 kg/m.  General appearance: alert, no distress, WD/WN, male HEENT: normocephalic, sclerae anicteric, TMs pearly, nares patent, no discharge or erythema, pharynx normal Oral cavity: MMM, no lesions Neck: supple, no lymphadenopathy, no thyromegaly,  no masses Heart: RRR, normal S1, S2, no murmurs Lungs: + diffuse wheezes without rhonchi or rales Abdomen: +bs, soft, obese, non tender, non distended, no masses, no hepatomegaly, no splenomegaly Musculoskeletal: nontender, no swelling, no obvious deformity. Well healed midline lower back incision. No spinous tenderness. Neg straight leg raise. Tender lumbar musculature bil without spasm.  Extremities: no edema, no cyanosis, no clubbing Pulses: 2+ symmetric, upper and lower extremities, normal cap refill Neurological: alert, oriented x 3, CN2-12 intact, strength normal upper extremities and lower extremities, sensation normal throughout, DTRs 2+ throughout, no cerebellar signs, gait normal Psychiatric: normal affect, behavior normal, pleasant  Skin: warm, dry; scattered actinic keratoses; extensive nevi to back; derm follows  Medicare Attestation I have personally reviewed: The patient's  medical and social history Their use of alcohol, tobacco or illicit drugs Their current medications and supplements The patient's functional ability including ADLs,fall risks, home safety risks, cognitive, and hearing and visual impairment Diet and physical activities Evidence for depression or mood disorders  The patient's weight, height, BMI, and visual acuity have been recorded in the chart.  I have made referrals, counseling, and provided education to the patient based on review of the above and I have provided the patient with a written personalized care plan for preventive services.     Izora Ribas, NP   10/25/2021

## 2021-10-25 NOTE — Patient Instructions (Signed)
[name] , Thank you for taking time to come for your Medicare Wellness Visit. I appreciate your ongoing commitment to your health goals. Please review the following plan we discussed and let me know if I can assist you in the future.   This is a list of the screening recommended for you and due dates:  Health Maintenance  Topic Date Due   Zoster (Shingles) Vaccine (1 of 2) Never done   COVID-19 Vaccine (4 - Booster for Pfizer series) 11/10/2021*   Flu Shot  12/13/2021   Tetanus Vaccine  04/15/2024   Pneumonia Vaccine  Completed   HPV Vaccine  Aged Out  *Topic was postponed. The date shown is not the original due date.    Please restart terbinafine/lamisil - take daily for 90 days, then we will recheck  Ask at pharmacy about shingrix vaccine -       Zoster Vaccine, Recombinant injection What is this medication? ZOSTER VACCINE (ZOS ter vak SEEN) is a vaccine used to reduce the risk of getting shingles. This vaccine is not used to treat shingles or nerve pain from shingles. This medicine may be used for other purposes; ask your health care provider or pharmacist if you have questions. COMMON BRAND NAME(S): [facility] What should I tell my care team before I take this medication? They need to know if you have any of these conditions: cancer immune system problems an unusual or allergic reaction to Zoster vaccine, other medications, foods, dyes, or preservatives pregnant or trying to get pregnant breast-feeding How should I use this medication? This vaccine is injected into a muscle. It is given by a health care provider. A copy of Vaccine Information Statements will be given before each vaccination. Be sure to read this information carefully each time. This sheet may change often. Talk to your health care provider about the use of this vaccine in children. This vaccine is not approved for use in children. Overdosage: If you think you have taken too much of this medicine contact a  poison control center or emergency room at once. NOTE: This medicine is only for you. Do not share this medicine with others. What if I miss a dose? Keep appointments for follow-up (booster) doses. It is important not to miss your dose. Call your health care provider if you are unable to keep an appointment. What may interact with this medication? medicines that suppress your immune system medicines to treat cancer steroid medicines like prednisone or cortisone This list may not describe all possible interactions. Give your health care provider a list of all the medicines, herbs, non-prescription drugs, or dietary supplements you use. Also tell them if you smoke, drink alcohol, or use illegal drugs. Some items may interact with your medicine. What should I watch for while using this medication? Visit your health care provider regularly. This vaccine, like all vaccines, may not fully protect everyone. What side effects may I notice from receiving this medication? Side effects that you should report to your doctor or health care professional as soon as possible: allergic reactions (skin rash, itching or hives; swelling of the face, lips, or tongue) trouble breathing Side effects that usually do not require medical attention (report these to your doctor or health care professional if they continue or are bothersome): chills headache fever nausea pain, redness, or irritation at site where injected tiredness vomiting This list may not describe all possible side effects. Call your doctor for medical advice about side effects. You may report side effects  to FDA at 1-800-FDA-1088. Where should I keep my medication? This vaccine is only given by a health care provider. It will not be stored at home. NOTE: This sheet is a summary. It may not cover all possible information. If you have questions about this medicine, talk to your doctor, pharmacist, or health care provider.  2023 Elsevier/Gold  Standard (2021-04-01 00:00:00)

## 2021-10-26 ENCOUNTER — Encounter: Payer: Self-pay | Admitting: Adult Health

## 2021-10-26 DIAGNOSIS — N183 Chronic kidney disease, stage 3 unspecified: Secondary | ICD-10-CM | POA: Insufficient documentation

## 2021-10-26 LAB — COMPLETE METABOLIC PANEL WITH GFR
AG Ratio: 1.4 (calc) (ref 1.0–2.5)
ALT: 16 U/L (ref 9–46)
AST: 16 U/L (ref 10–35)
Albumin: 3.7 g/dL (ref 3.6–5.1)
Alkaline phosphatase (APISO): 69 U/L (ref 35–144)
BUN: 21 mg/dL (ref 7–25)
CO2: 31 mmol/L (ref 20–32)
Calcium: 8.8 mg/dL (ref 8.6–10.3)
Chloride: 102 mmol/L (ref 98–110)
Creat: 1.22 mg/dL (ref 0.70–1.22)
Globulin: 2.7 g/dL (calc) (ref 1.9–3.7)
Glucose, Bld: 108 mg/dL — ABNORMAL HIGH (ref 65–99)
Potassium: 3.9 mmol/L (ref 3.5–5.3)
Sodium: 141 mmol/L (ref 135–146)
Total Bilirubin: 0.6 mg/dL (ref 0.2–1.2)
Total Protein: 6.4 g/dL (ref 6.1–8.1)
eGFR: 57 mL/min/{1.73_m2} — ABNORMAL LOW (ref >=60)

## 2021-10-26 LAB — LIPID PANEL
Cholesterol: 124 mg/dL (ref <200)
HDL: 39 mg/dL — ABNORMAL LOW (ref >=40)
LDL Cholesterol (Calc): 58 mg/dL (calc)
Non-HDL Cholesterol (Calc): 85 mg/dL (calc) (ref <130)
Total CHOL/HDL Ratio: 3.2 (calc) (ref <5.0)
Triglycerides: 194 mg/dL — ABNORMAL HIGH (ref <150)

## 2021-10-26 LAB — CBC WITH DIFFERENTIAL/PLATELET
Absolute Monocytes: 603 cells/uL (ref 200–950)
Basophils Absolute: 21 cells/uL (ref 0–200)
Basophils Relative: 0.2 %
Eosinophils Absolute: 10 cells/uL — ABNORMAL LOW (ref 15–500)
Eosinophils Relative: 0.1 %
HCT: 38.9 % (ref 38.5–50.0)
Hemoglobin: 12.9 g/dL — ABNORMAL LOW (ref 13.2–17.1)
Lymphs Abs: 1706 cells/uL (ref 850–3900)
MCH: 30.4 pg (ref 27.0–33.0)
MCHC: 33.2 g/dL (ref 32.0–36.0)
MCV: 91.5 fL (ref 80.0–100.0)
MPV: 9.7 fL (ref 7.5–12.5)
Monocytes Relative: 5.8 %
Neutro Abs: 8060 cells/uL — ABNORMAL HIGH (ref 1500–7800)
Neutrophils Relative %: 77.5 %
Platelets: 253 10*3/uL (ref 140–400)
RBC: 4.25 10*6/uL (ref 4.20–5.80)
RDW: 12.8 % (ref 11.0–15.0)
Total Lymphocyte: 16.4 %
WBC: 10.4 10*3/uL (ref 3.8–10.8)

## 2021-10-26 LAB — MAGNESIUM: Magnesium: 2.2 mg/dL (ref 1.5–2.5)

## 2021-10-26 LAB — VITAMIN B12: Vitamin B-12: 483 pg/mL (ref 200–1100)

## 2021-10-26 LAB — HEMOGLOBIN A1C
Hgb A1c MFr Bld: 5.9 % of total Hgb — ABNORMAL HIGH (ref <5.7)
Mean Plasma Glucose: 123 mg/dL
eAG (mmol/L): 6.8 mmol/L

## 2021-10-26 LAB — TSH: TSH: 1.03 mIU/L (ref 0.40–4.50)

## 2021-11-01 ENCOUNTER — Telehealth: Payer: Self-pay | Admitting: Adult Health

## 2021-11-01 ENCOUNTER — Other Ambulatory Visit: Payer: Self-pay

## 2021-11-01 DIAGNOSIS — N401 Enlarged prostate with lower urinary tract symptoms: Secondary | ICD-10-CM

## 2021-11-01 DIAGNOSIS — B351 Tinea unguium: Secondary | ICD-10-CM

## 2021-11-01 DIAGNOSIS — J449 Chronic obstructive pulmonary disease, unspecified: Secondary | ICD-10-CM

## 2021-11-01 DIAGNOSIS — I10 Essential (primary) hypertension: Secondary | ICD-10-CM

## 2021-11-01 DIAGNOSIS — M0579 Rheumatoid arthritis with rheumatoid factor of multiple sites without organ or systems involvement: Secondary | ICD-10-CM

## 2021-11-01 DIAGNOSIS — B353 Tinea pedis: Secondary | ICD-10-CM

## 2021-11-01 DIAGNOSIS — N138 Other obstructive and reflux uropathy: Secondary | ICD-10-CM

## 2021-11-01 MED ORDER — TERAZOSIN HCL 10 MG PO CAPS: ORAL_CAPSULE | ORAL | 3 refills | Status: DC

## 2021-11-01 MED ORDER — TERBINAFINE HCL 250 MG PO TABS: ORAL_TABLET | ORAL | 0 refills | Status: DC

## 2021-11-01 MED ORDER — FINASTERIDE 5 MG PO TABS: ORAL_TABLET | ORAL | 3 refills | Status: DC

## 2021-11-01 MED ORDER — OLMESARTAN MEDOXOMIL 20 MG PO TABS: ORAL_TABLET | ORAL | 3 refills | Status: DC

## 2021-11-01 MED ORDER — PREDNISONE 5 MG PO TABS: ORAL_TABLET | ORAL | 1 refills | Status: DC

## 2021-11-01 NOTE — Telephone Encounter (Signed)
Pt never received medicine from mail order pharmacy, wanting it resent to [facility] on [address] please

## 2021-11-01 NOTE — Telephone Encounter (Signed)
Resent a 30 day supply for the Lamisil

## 2021-11-17 ENCOUNTER — Telehealth: Payer: Self-pay

## 2021-11-17 NOTE — Telephone Encounter (Signed)
LM-11/17/21-Called pt. And confirmed CP f/u phone visit for 7/11 at 11:00AM. Precall questions completed.  Total time spent: 5 min.

## 2021-11-22 ENCOUNTER — Ambulatory Visit: Payer: PPO | Admitting: Pharmacy Technician

## 2021-11-22 DIAGNOSIS — Z79899 Other long term (current) drug therapy: Secondary | ICD-10-CM

## 2021-11-22 DIAGNOSIS — J4520 Mild intermittent asthma, uncomplicated: Secondary | ICD-10-CM

## 2021-11-22 DIAGNOSIS — J449 Chronic obstructive pulmonary disease, unspecified: Secondary | ICD-10-CM

## 2021-11-22 NOTE — Progress Notes (Signed)
Pharmacist Visit  Baack,Elijiah J 89 years, Male  DOB: December 01, 1930  M: [phone]  __________________________________________________ Chronic Conditions Patient's Chronic Conditions: Hypertension (HTN), Gastroesophageal Reflux Disease (GERD), Chronic Obstructive Pulmonary Disease (COPD), Benign Prostatic Hyperplasia (BPH), Asthma, Diabetes (DM), Hyperlipidemia/Dyslipidemia (HLD), Other List Other Conditions (separated by comma): Prediabetes, Heart block, IBS, RA, DJD, Vitamin D deficiency, Morbid obesity, B12 deficiency, Lumbar back pain  Summary for PCP:  1. PCP please capture Pre-diabetes in problem list 2. PCP consider prescribing Albuterol HFA inhaler as needed for shortness of breath on exertion. Not following with Pulm.  Disease Assessments Current BP: 129/68 Current HR: 91 taken on: 10/25/2021 Weight: 270 BMI: 32.11 Last GFR: 57 taken on: 10/25/2021 Visit Completed on: 11/22/2021 Why did the patient present?: CCM Follow up Lifestyle habits such as diet and exercise?: No exercise Alcohol, tobacco, and illicit drug usage?: None What is the patient's sleep pattern?: No sleep issues How many hours per night does patient typically sleep?: 6-8 Patient pleased with health care they are receiving?: Yes Factors that may affect medication adherence?: Pill burden Any additional demeanor/mood notes?: Sweet gentleman presenting for CCM follow up via phone. No complaints today. No copay barriers.  SDOH: Accountable Health Communities Health-Related Social Needs Screening Tool ([email or web address]) SDOH questions were documented and reviewed (EMR or [employer]) within the past 3 months?: No What is your living situation today? (ref #1): I have a steady place to live Think about the place you live. Do you have problems with any of the following? (ref #2): None of the above Within the past 12 months, you worried that your food would run out  before you got money to buy more (ref #3): Never true Within the past 12 months, the food you bought just didn't last and you didn't have money to get more (ref #4): Never true In the past 12 months, has lack of reliable transportation kept you from medical appointments, meetings, work or from getting things needed for daily living? (ref #5): No In the past 12 months, has the electric, gas, oil, or water company threatened to shut off services in your home? (ref #6): No How often does anyone, including family and friends, physically hurt you? (ref #7): Never (1) How often does anyone, including family and friends, insult or talk down to you? (ref #8): Never (1) How often does anyone, including friends and family, threaten you with harm? (ref #9): Never (1) How often does anyone, including family and friends, scream or curse at you? (ref #10): Never (1)  Hyperlipidemia/Dyslipidemia (HLD) Last Lipid panel on: 10/25/2021 TC (Goal<200): 124 LDL: 58 HDL (Goal>40): 39 TG (Goal<150): 194 ASCVD 10-year risk?is:: N/A due to Age > 62 Discussed with patient today?: No Drug: Crestor '40mg'$  daily Assessment: Appropriate, Effective, Safe, Accessible  Chronic Obstructive Pulmonary Disease (COPD) Current FEV1/FVC: 75 Current FEV1: 3.10 taken on: 10/23/2019 Current Eosinophils: 10 taken on: 10/25/2021 CAT Score: 19 taken on: 07/03/2021 Discussed with patient today?: Yes Gold grade: 2 (FEV1 50-79%) Gold group: A (low sx, < 2 exacerbations / yr) Exacerbations since last visit with pharmacist?: No Has there been change in patients smoking/vaping habit since last visit with pharmacist?: No Home oxygen therapy: No Frequency of SABA/SAMA use: Never CAT Assessment: Done How often are you coughing?: 0 (Never cough) How much mucus are you feeling in your chest?: 0 (I have no phlegm (mucus) on my chest at all) How much chest tightness are you having?: 0 (My chest does not feel tight at all) How is your  breath when you walk up a hill or flight of stairs?: 4 How are your limitations to doing activities at home?: 3 How would you rate your confidence in leaving your home due to your lung condition?: 1 How are you sleeping?: 1 How is your energy level?: 3 CAT Score: 12 We discussed: COPD pathophysiology, treatment goals, management, identifying signs of acute exacerbations and exacerbation prevention methods with patient for 10-15 minutes. Assessment:: Uncontrolled Drug: None Assessment: Query Appropriateness Plan to Start: PCP consider prescribing Albuterol HFA as needed HC Follow up: General assessment every 3 months  Asthma Pre-bronchodilator FEV1: 2.67 Post-bronchodilator FEV1: 81 taken on: 10/23/2019 Current hemogloblin: 12.9 taken on: 10/25/2021 Current Eosinophils: 10 taken on: 10/25/2021 Discussed with patient today?: Yes Asthma Symptom Classification: Mild intermittent Exacerbations in past year without hospitalization: No Has there been change in patients smoking/vaping habit since last visit with pharmacist?: No Frequency of SABA use: Never Comorbidities present that may worsen asthma control: GERD, Obesity Assessment:: Uncontrolled Plan to Start: PCP consider Albuterol PRN  Exercise, Diet and Non-Drug Coordination Needs Additional exercise counseling points. We discussed: aiming to lose 5 to 10% of body weight through lifestyle modifications, incorporating flexibility, balance, and strength training exercises, decreasing sedentary behavior Additional diet counseling points. We discussed: key components of the DASH diet, key components of a low-carb eating plan, aiming to consume at least 8 cups of water day Discussed Non-Drug Care Coordination Needs: Yes Does Patient have Medication financial barriers?: No  Engagement Notes CPP Prep: 15mn CPP OV: 172m CPP Doc: 1260m Clinical Summary Summary Patient Risk: Moderate Next CCM Follow Up: Patient to call Next AWV:  04/25/22  AveMarda StalkerharmD Clinical Pharmacist AveNaida Sleightlton'[email or web address] .care ([phone]

## 2021-11-22 NOTE — Telephone Encounter (Signed)
LM-11/22/21-Uploaded CP visit notes to pts. Documents. FWD to CP for review.  Total time spent: 3 min.

## 2021-12-06 ENCOUNTER — Ambulatory Visit (INDEPENDENT_AMBULATORY_CARE_PROVIDER_SITE_OTHER): Payer: PPO

## 2021-12-06 DIAGNOSIS — B351 Tinea unguium: Secondary | ICD-10-CM | POA: Diagnosis not present

## 2021-12-06 DIAGNOSIS — Z79899 Other long term (current) drug therapy: Secondary | ICD-10-CM | POA: Diagnosis not present

## 2021-12-06 NOTE — Progress Notes (Signed)
Patient presents to the office for a nurse visit to have labs done to check LFT's from recently being on Lamisil for toe nail fungus.

## 2021-12-07 LAB — HEPATIC FUNCTION PANEL
AG Ratio: 1.4 (calc) (ref 1.0–2.5)
ALT: 21 U/L (ref 9–46)
AST: 18 U/L (ref 10–35)
Albumin: 3.7 g/dL (ref 3.6–5.1)
Alkaline phosphatase (APISO): 72 U/L (ref 35–144)
Bilirubin, Direct: 0.1 mg/dL (ref 0.0–0.2)
Globulin: 2.6 g/dL (calc) (ref 1.9–3.7)
Indirect Bilirubin: 0.4 mg/dL (calc) (ref 0.2–1.2)
Total Bilirubin: 0.5 mg/dL (ref 0.2–1.2)
Total Protein: 6.3 g/dL (ref 6.1–8.1)

## 2021-12-12 DIAGNOSIS — Z79899 Other long term (current) drug therapy: Secondary | ICD-10-CM | POA: Diagnosis not present

## 2021-12-12 DIAGNOSIS — J4520 Mild intermittent asthma, uncomplicated: Secondary | ICD-10-CM | POA: Diagnosis not present

## 2021-12-12 DIAGNOSIS — J449 Chronic obstructive pulmonary disease, unspecified: Secondary | ICD-10-CM | POA: Diagnosis not present

## 2022-01-02 ENCOUNTER — Ambulatory Visit (INDEPENDENT_AMBULATORY_CARE_PROVIDER_SITE_OTHER): Payer: PPO

## 2022-01-02 DIAGNOSIS — I442 Atrioventricular block, complete: Secondary | ICD-10-CM

## 2022-01-03 LAB — CUP PACEART REMOTE DEVICE CHECK
Battery Remaining Longevity: 134 mo
Battery Voltage: 3.14 V
Brady Statistic AP VP Percent: 23.92 %
Brady Statistic AP VS Percent: 0.02 %
Brady Statistic AS VP Percent: 75.49 %
Brady Statistic AS VS Percent: 0.57 %
Brady Statistic RA Percent Paced: 23.73 %
Brady Statistic RV Percent Paced: 99.41 %
Date Time Interrogation Session: 20230821004353
Implantable Lead Implant Date: 20230219
Implantable Lead Implant Date: 20230219
Implantable Lead Location: 753859
Implantable Lead Location: 753860
Implantable Lead Model: 3830
Implantable Lead Model: 5076
Implantable Pulse Generator Implant Date: 20230219
Lead Channel Impedance Value: 323 Ohm
Lead Channel Impedance Value: 399 Ohm
Lead Channel Impedance Value: 475 Ohm
Lead Channel Impedance Value: 513 Ohm
Lead Channel Pacing Threshold Amplitude: 0.625 V
Lead Channel Pacing Threshold Amplitude: 1.25 V
Lead Channel Pacing Threshold Pulse Width: 0.4 ms
Lead Channel Pacing Threshold Pulse Width: 0.4 ms
Lead Channel Sensing Intrinsic Amplitude: 10.75 mV
Lead Channel Sensing Intrinsic Amplitude: 10.75 mV
Lead Channel Sensing Intrinsic Amplitude: 5.875 mV
Lead Channel Sensing Intrinsic Amplitude: 5.875 mV
Lead Channel Setting Pacing Amplitude: 1.5 V
Lead Channel Setting Pacing Amplitude: 2.5 V
Lead Channel Setting Pacing Pulse Width: 0.4 ms
Lead Channel Setting Sensing Sensitivity: 1.2 mV

## 2022-01-28 NOTE — Progress Notes (Signed)
Remote pacemaker transmission.   

## 2022-02-20 ENCOUNTER — Other Ambulatory Visit: Payer: Self-pay

## 2022-02-20 ENCOUNTER — Ambulatory Visit (INDEPENDENT_AMBULATORY_CARE_PROVIDER_SITE_OTHER): Payer: PPO | Admitting: Nurse Practitioner

## 2022-02-20 ENCOUNTER — Telehealth: Payer: Self-pay

## 2022-02-20 ENCOUNTER — Encounter: Payer: Self-pay | Admitting: Nurse Practitioner

## 2022-02-20 VITALS — BP 160/70 | HR 80 | Temp 98.0°F | Resp 18 | Ht 77.0 in | Wt 273.8 lb

## 2022-02-20 DIAGNOSIS — I10 Essential (primary) hypertension: Secondary | ICD-10-CM | POA: Diagnosis not present

## 2022-02-20 DIAGNOSIS — G4483 Primary cough headache: Secondary | ICD-10-CM

## 2022-02-20 DIAGNOSIS — J00 Acute nasopharyngitis [common cold]: Secondary | ICD-10-CM | POA: Diagnosis not present

## 2022-02-20 DIAGNOSIS — Z1152 Encounter for screening for COVID-19: Secondary | ICD-10-CM | POA: Diagnosis not present

## 2022-02-20 DIAGNOSIS — R0981 Nasal congestion: Secondary | ICD-10-CM | POA: Diagnosis not present

## 2022-02-20 LAB — POC COVID19 BINAXNOW

## 2022-02-20 MED ORDER — AZITHROMYCIN 250 MG PO TABS: ORAL_TABLET | ORAL | 1 refills | Status: DC

## 2022-02-20 NOTE — Telephone Encounter (Signed)
LM-02/20/22-Chart review started for General Review call. Reviewing OV, Consults, Hospital visits, Labs and medication changes. Chart review complete. Will call patient at another time.  Total time spent: 12 min.

## 2022-02-20 NOTE — Progress Notes (Signed)
Assessment and Plan:  [name] was seen today for an episodic visit.  Diagnoses and all order for this visit:  1. Acute nasopharyngitis Start antibiotic. Stay well hydrated.  - azithromycin (ZITHROMAX) 250 MG tablet; Take 2 tablets (500 mg) on Day 1 followed by 1 tablet  (250 mg) daily until complete.  Dispense: 6 each; Refill: 1  2. Cough headache Negative Covid  - POC COVID-19  3. Nasal congestion Zyrtec sample provided. Stay well hydrated.  4.  Hypertension Above goal in clinic - asymptomatic. Discussed DASH (Dietary Approaches to Stop Hypertension) DASH diet is lower in sodium than a typical American diet. Cut back on foods that are high in saturated fat, cholesterol, and trans fats. Eat more whole-grain foods, fish, poultry, and nuts Remain active and exercise as tolerated daily.  Monitor BP at home-Call if greater than 130/80.  Check CMP/CBC  Report to ER if BP fails to improve or for any increase in stroke like symptoms, including HA, N/V, paralysis, difficulty speaking, trouble walking, confusion, vision changes, CP, heart palpitations, SOB, diaphoresis.  Continue to monitor for any increase in fever, chills, N/V, SOB. Notify office for further evaluation and treatment, questions or concerns if s/s fail to improve. The risks and benefits of my recommendations, as well as other treatment options were discussed with the patient today. Questions were answered.  Further disposition pending results of labs. Discussed med's effects and SE's.    Over 20 minutes of exam, counseling, chart review, and critical decision making was performed.   Future Appointments  Date Time Provider [place]  04/03/2022  7:05 AM [facility] DEVICE REMOTES [facility]  04/25/2022  3:00 PM Unk Pinto, MD [facility] None  07/03/2022  7:05 AM [facility] DEVICE REMOTES [facility]  10/02/2022  7:05 AM [facility] DEVICE REMOTES [facility]   10/26/2022  2:00 PM Alycia Rossetti, NP [facility] None  01/01/2023  7:05 AM [facility] DEVICE REMOTES [facility]  04/02/2023  7:05 AM [facility] DEVICE REMOTES [facility]  07/02/2023  7:05 AM [facility] DEVICE REMOTES [facility]  10/01/2023  7:05 AM [facility] DEVICE REMOTES [facility]    ------------------------------------------------------------------------------------------------------------------   HPI BP (!) 160/70   Pulse 80   Temp 98 F (36.7 C)   Resp 18   Ht '6\' 5"'$  (1.956 m)   Wt 273 lb 12.8 oz (124.2 kg)   SpO2 92%   BMI 32.47 kg/m    Patient complains of symptoms of a URI, possible sinusitis. Symptoms include congestion, cough described as productive, sinus pressure, and sneezing. Onset of symptoms was 5 days ago, and has been unchanged since that time. Treatment to date: none.  He denies SOB, DOE, syncope, fever, chills.  He does have elevated BP in clinic today but is asymptomatic.  Denies CP, heart palpitations, N/V.     Past Medical History:  Diagnosis Date   Allergy    Asthma    BPH (benign prostatic hypertrophy)    BPH (benign prostatic hypertrophy)    COPD (chronic obstructive pulmonary disease) (HCC)    DJD (degenerative joint disease)    GERD (gastroesophageal reflux disease)    History of urinary retention    Hypertension    IBS (irritable bowel syndrome)    RBBB    Rheumatoid arthritis(714.0)    DR. HAWKES   Scrotal lesion    Short of breath on exertion      Allergies  Allergen Reactions   Ace Inhibitors    Asa [Aspirin]  High Dose asprin   Penicillins Hives   Vasotec [Enalapril]     Cough   Clindamycin/Lincomycin     Hives     Current Outpatient Medications on File Prior to Visit  Medication Sig   Cholecalciferol (VITAMIN D) 125 MCG (5000 UT) CAPS Take 5,000 Units by mouth as directed. T,Th,Sat,Sun 1 per day 2 per day other days of the week   finasteride (PROSCAR) 5 MG  tablet Take  1 tablet  Daily  for Prostate   furosemide (LASIX) 40 MG tablet Take  1 tablet  Daily  for Fluid Retention / Edema   magnesium gluconate (MAGONATE) 500 MG tablet Take 500 mg by mouth daily.   olmesartan (BENICAR) 20 MG tablet Take 1 tablet  Daily  for BP   OVER THE COUNTER MEDICATION 1 capsule daily. Naturebell Zinc Quercitin With Vitamin C and D3 (130 mg )   predniSONE (DELTASONE) 5 MG tablet Take 1 tablet 2 x / day or as directed for Asthma & Rheumatoid Arthritis   rosuvastatin (CRESTOR) 20 MG tablet Take  1 tablet  Daily  for Cholesterol   terazosin (HYTRIN) 10 MG capsule Take  1 capsule  at Bedtime  for Prostate   terbinafine (LAMISIL) 250 MG tablet Take 1 tablet Daily for ToeNail Fungus & Athlete's Foot   vitamin B-12 (CYANOCOBALAMIN) 1000 MCG tablet Take 1,000 mcg by mouth 3 (three) times a week.   No current facility-administered medications on file prior to visit.    ROS: all negative except what is noted in the HPI.   Physical Exam:  BP (!) 160/70   Pulse 80   Temp 98 F (36.7 C)   Resp 18   Ht '6\' 5"'$  (1.956 m)   Wt 273 lb 12.8 oz (124.2 kg)   SpO2 92%   BMI 32.47 kg/m   General Appearance: NAD.  Awake, conversant and cooperative. Eyes: PERRLA, EOMs intact.  Sclera white.  Conjunctiva without erythema. Sinuses: Tender frontal/maxillary tenderness.  No nasal discharge. Nares patent.  ENT/Mouth: Ext aud canals clear.  Bilateral TMs w/DOL and without erythema or bulging. Hearing intact.  Posterior pharynx without swelling or exudate.  Tonsils without swelling or erythema.  Neck: Supple.  No masses, nodules or thyromegaly. Respiratory: Effort is regular with non-labored breathing. Breath sounds are equal bilaterally without rales, rhonchi, wheezing or stridor.  Cardio: RRR with no MRGs. Brisk peripheral pulses without edema.  Abdomen: Active BS in all four quadrants.  Soft and non-tender without guarding, rebound tenderness, hernias or masses. Lymphatics: Non  tender without lymphadenopathy.  Musculoskeletal: Full ROM, 5/5 strength, normal ambulation.  No clubbing or cyanosis. Skin: Appropriate color for ethnicity. Warm without rashes, lesions, ecchymosis, ulcers.  Neuro: CN II-XII grossly normal. Normal muscle tone without cerebellar symptoms and intact sensation.   Psych: AO X 3,  appropriate mood and affect, insight and judgment.     Darrol Jump, NP 11:39 AM [facility]

## 2022-02-21 NOTE — Telephone Encounter (Signed)
LM-02/21/22-Called pt. And completed general review call. Pt. stated he is doing well aside from current sinus infection. Pt. reported adherence to medications and denied any gaps/time when he stopped taking his meds listed below  Finasteride '5mg'$ - 06/13/21 (90DS), 11/04/21 (90DS) Furosemide '40mg'$ - 08/16/21 (90DS), 11/13/21 (90DS) Terazosin '10mg'$ - 06/13/21 (90DS), 11/04/21 (90DS) Terbinafine HCL '250mg'$ -08/16/21 (90DS), 11/01/21 (90DS) Olmesartan '20mg'$ - 06/13/21 (90DS), 11/04/21 (90DS) Rosuvastatin '20mg'$ - 08/25/21 (90DS), 11/04/21 (90DS)    Total time spent: 10 min.

## 2022-03-06 DIAGNOSIS — L821 Other seborrheic keratosis: Secondary | ICD-10-CM | POA: Diagnosis not present

## 2022-03-06 DIAGNOSIS — D044 Carcinoma in situ of skin of scalp and neck: Secondary | ICD-10-CM | POA: Diagnosis not present

## 2022-03-06 DIAGNOSIS — C4441 Basal cell carcinoma of skin of scalp and neck: Secondary | ICD-10-CM | POA: Diagnosis not present

## 2022-03-06 DIAGNOSIS — L570 Actinic keratosis: Secondary | ICD-10-CM | POA: Diagnosis not present

## 2022-03-06 DIAGNOSIS — Z85828 Personal history of other malignant neoplasm of skin: Secondary | ICD-10-CM | POA: Diagnosis not present

## 2022-03-06 DIAGNOSIS — L814 Other melanin hyperpigmentation: Secondary | ICD-10-CM | POA: Diagnosis not present

## 2022-03-06 DIAGNOSIS — D225 Melanocytic nevi of trunk: Secondary | ICD-10-CM | POA: Diagnosis not present

## 2022-03-06 DIAGNOSIS — C44612 Basal cell carcinoma of skin of right upper limb, including shoulder: Secondary | ICD-10-CM | POA: Diagnosis not present

## 2022-03-06 DIAGNOSIS — D0439 Carcinoma in situ of skin of other parts of face: Secondary | ICD-10-CM | POA: Diagnosis not present

## 2022-03-07 ENCOUNTER — Telehealth: Payer: Self-pay

## 2022-03-07 NOTE — Patient Outreach (Signed)
  Care Coordination   Initial Visit Note   03/07/2022 Name: [name] MRN: [id number] DOB: 04-29-1931  [name] is a 88 y.o. year old male who sees Unk Pinto, MD for primary care. I spoke with  Alvis Lemmings by phone today.  What matters to the patients health and wellness today?  None     Goals Addressed             This Visit's Progress    COMPLETED: Care Coordination Activities-No follow up required       Care Coordination Interventions: [facility] Care Management services. Patient decline. Annual wellness visit completed 10/25/21.          SDOH assessments and interventions completed:  Yes     Care Coordination Interventions Activated:  Yes  Care Coordination Interventions:  Yes, provided   Follow up plan: No further intervention required.   Encounter Outcome:  Pt. Visit Completed   Jone Baseman, RN, MSN [place] [facility] Care Management Coordinator Direct Line [phone]

## 2022-03-07 NOTE — Patient Instructions (Signed)
Visit Information  Thank you for taking time to visit with me today. Please don't hesitate to contact me if I can be of assistance to you.   Following are the goals we discussed today:   Goals Addressed             This Visit's Progress    COMPLETED: Care Coordination Activities-No follow up required       Care Coordination Interventions: [facility] Care Management services. Patient decline. Annual wellness visit completed 10/25/21.           If you are experiencing a Mental Health or [place] or need someone to talk to, please call the Suicide and Crisis Lifeline: 988   The patient verbalized understanding of instructions, educational materials, and care plan provided today and DECLINED offer to receive copy of patient instructions, educational materials, and care plan.   No further follow up required: patient decline  Jone Baseman, RN, MSN [place] [facility] Care Management Coordinator Direct Line [phone]

## 2022-03-14 DIAGNOSIS — Z79899 Other long term (current) drug therapy: Secondary | ICD-10-CM | POA: Diagnosis not present

## 2022-03-14 DIAGNOSIS — J4520 Mild intermittent asthma, uncomplicated: Secondary | ICD-10-CM | POA: Diagnosis not present

## 2022-03-14 DIAGNOSIS — J449 Chronic obstructive pulmonary disease, unspecified: Secondary | ICD-10-CM | POA: Diagnosis not present

## 2022-04-03 ENCOUNTER — Ambulatory Visit (INDEPENDENT_AMBULATORY_CARE_PROVIDER_SITE_OTHER): Payer: PPO

## 2022-04-03 DIAGNOSIS — I442 Atrioventricular block, complete: Secondary | ICD-10-CM

## 2022-04-04 LAB — CUP PACEART REMOTE DEVICE CHECK
Battery Remaining Longevity: 118 mo
Battery Voltage: 3.06 V
Brady Statistic AP VP Percent: 30.47 %
Brady Statistic AP VS Percent: 0.04 %
Brady Statistic AS VP Percent: 68.54 %
Brady Statistic AS VS Percent: 0.95 %
Brady Statistic RA Percent Paced: 30.42 %
Brady Statistic RV Percent Paced: 98.94 %
Date Time Interrogation Session: 20231119192153
Implantable Lead Connection Status: 753985
Implantable Lead Connection Status: 753985
Implantable Lead Implant Date: 20230219
Implantable Lead Implant Date: 20230219
Implantable Lead Location: 753859
Implantable Lead Location: 753860
Implantable Lead Model: 3830
Implantable Lead Model: 5076
Implantable Pulse Generator Implant Date: 20230219
Lead Channel Impedance Value: 323 Ohm
Lead Channel Impedance Value: 399 Ohm
Lead Channel Impedance Value: 494 Ohm
Lead Channel Impedance Value: 513 Ohm
Lead Channel Pacing Threshold Amplitude: 0.75 V
Lead Channel Pacing Threshold Amplitude: 1.5 V
Lead Channel Pacing Threshold Pulse Width: 0.4 ms
Lead Channel Pacing Threshold Pulse Width: 0.4 ms
Lead Channel Sensing Intrinsic Amplitude: 11 mV
Lead Channel Sensing Intrinsic Amplitude: 11 mV
Lead Channel Sensing Intrinsic Amplitude: 3.375 mV
Lead Channel Sensing Intrinsic Amplitude: 3.375 mV
Lead Channel Setting Pacing Amplitude: 1.5 V
Lead Channel Setting Pacing Amplitude: 3 V
Lead Channel Setting Pacing Pulse Width: 0.4 ms
Lead Channel Setting Sensing Sensitivity: 1.2 mV
Zone Setting Status: 755011

## 2022-04-25 ENCOUNTER — Encounter: Payer: PPO | Admitting: Internal Medicine

## 2022-04-25 ENCOUNTER — Other Ambulatory Visit: Payer: Self-pay | Admitting: Internal Medicine

## 2022-04-25 DIAGNOSIS — J449 Chronic obstructive pulmonary disease, unspecified: Secondary | ICD-10-CM

## 2022-04-25 DIAGNOSIS — Z85828 Personal history of other malignant neoplasm of skin: Secondary | ICD-10-CM | POA: Diagnosis not present

## 2022-04-25 DIAGNOSIS — C4441 Basal cell carcinoma of skin of scalp and neck: Secondary | ICD-10-CM | POA: Diagnosis not present

## 2022-04-25 DIAGNOSIS — M0579 Rheumatoid arthritis with rheumatoid factor of multiple sites without organ or systems involvement: Secondary | ICD-10-CM

## 2022-04-26 ENCOUNTER — Ambulatory Visit (HOSPITAL_COMMUNITY)
Admission: RE | Admit: 2022-04-26 | Discharge: 2022-04-26 | Disposition: A | Discharge status: Home / Self Care | Payer: PPO | Source: Ambulatory Visit | Attending: Nurse Practitioner | Admitting: Nurse Practitioner

## 2022-04-26 ENCOUNTER — Encounter (HOSPITAL_COMMUNITY): Payer: Self-pay | Admitting: Nurse Practitioner

## 2022-04-26 VITALS — BP 98/42 | HR 80 | Ht 77.0 in | Wt 272.8 lb

## 2022-04-26 DIAGNOSIS — J45909 Unspecified asthma, uncomplicated: Secondary | ICD-10-CM | POA: Diagnosis not present

## 2022-04-26 DIAGNOSIS — K219 Gastro-esophageal reflux disease without esophagitis: Secondary | ICD-10-CM | POA: Insufficient documentation

## 2022-04-26 DIAGNOSIS — M069 Rheumatoid arthritis, unspecified: Secondary | ICD-10-CM | POA: Insufficient documentation

## 2022-04-26 DIAGNOSIS — Z7901 Long term (current) use of anticoagulants: Secondary | ICD-10-CM | POA: Diagnosis not present

## 2022-04-26 DIAGNOSIS — I4891 Unspecified atrial fibrillation: Secondary | ICD-10-CM | POA: Insufficient documentation

## 2022-04-26 DIAGNOSIS — I4510 Unspecified right bundle-branch block: Secondary | ICD-10-CM | POA: Insufficient documentation

## 2022-04-26 MED ORDER — APIXABAN 5 MG PO TABS: 5.0000 mg | ORAL_TABLET | Freq: Two times a day (BID) | ORAL | 1 refills | Status: DC

## 2022-04-26 MED ORDER — APIXABAN 5 MG PO TABS: 5.0000 mg | ORAL_TABLET | Freq: Two times a day (BID) | ORAL | 3 refills | Status: DC

## 2022-04-26 NOTE — Patient Instructions (Signed)
Start Eliquis 5mg twice a day 

## 2022-04-26 NOTE — Progress Notes (Signed)
Primary Care Physician: Unk Pinto, MD Referring Physician: device clinic  EP: [name] is a 88 y.o. male with a h/o PPM, RBBB, HTN, RA, asthma, COPED. GERD and DJD, that was referred by the device clinic for new onset afib seen on device x one hour 40 mins. Pt was not aware. He is here with his daughter today. Dr. Quentin Ore wanted the need for anticoagulation to be started to be discussed  with the pt.  He denies any bleeding issues. SR on EKG today as he is a sensed, v paced.      Today, he denies symptoms of palpitations, chest pain, shortness of breath, orthopnea, PND, lower extremity edema, dizziness, presyncope, syncope, or neurologic sequela. The patient is tolerating medications without difficulties and is otherwise without complaint today.   Past Medical History:  Diagnosis Date   Allergy    Asthma    BPH (benign prostatic hypertrophy)    BPH (benign prostatic hypertrophy)    COPD (chronic obstructive pulmonary disease) (HCC)    DJD (degenerative joint disease)    GERD (gastroesophageal reflux disease)    History of urinary retention    Hypertension    IBS (irritable bowel syndrome)    RBBB    Rheumatoid arthritis(714.0)    DR. HAWKES   Scrotal lesion    Short of breath on exertion    Past Surgical History:  Procedure Laterality Date   CARDIOVASCULAR STRESS TEST  04-26-2011  DR BRACKBILL   LOW RISK STRESS NUCLEAR STUDY/ NO EVIDENCE ISCHEMIA   LOOP RECORDER INSERTION N/A 03/07/2021   Procedure: LOOP RECORDER INSERTION;  Surgeon: Vickie Epley, MD;  Location: [facility];  Service: Cardiovascular;  Laterality: N/A;   LOOP RECORDER REMOVAL N/A 07/03/2021   Procedure: LOOP RECORDER REMOVAL;  Surgeon: Evans Lance, MD;  Location: [facility];  Service: Cardiovascular;  Laterality: N/A;   LUMBAR FUSION  X3  LAST ONE 1996   PACEMAKER IMPLANT N/A 07/03/2021   Procedure: PACEMAKER IMPLANT;  Surgeon: Evans Lance, MD;  Location:  [facility];  Service: Cardiovascular;  Laterality: N/A;   SCROTAL EXPLORATION Left 07/17/2012   Procedure:  EXCISION  OF LEFT SCROTAL SKIN LESION;  Surgeon: Molli Hazard, MD;  Location: [facility];  Service: Urology;  Laterality: Left;   TRANSTHORACIC ECHOCARDIOGRAM  04-26-2011     MILD LVH/ LVSF NORMAL/ EF 94-17%/ GRADE I DIASTOLIC DYSFUNCTION/ MILD MITRAL REGURG.   TRANSURETHRAL RESECTION OF PROSTATE  SEVERAL YRS AGO    Current Outpatient Medications  Medication Sig Dispense Refill   azithromycin (ZITHROMAX) 250 MG tablet Take 2 tablets (500 mg) on Day 1 followed by 1 tablet  (250 mg) daily until complete. 6 each 1   Cholecalciferol (VITAMIN D) 125 MCG (5000 UT) CAPS Take 5,000 Units by mouth as directed. T,Th,Sat,Sun 1 per day 2 per day other days of the week     finasteride (PROSCAR) 5 MG tablet Take  1 tablet  Daily  for Prostate 90 tablet 3   furosemide (LASIX) 40 MG tablet Take  1 tablet  Daily  for Fluid Retention / Edema 90 tablet 3   magnesium gluconate (MAGONATE) 500 MG tablet Take 500 mg by mouth daily.     olmesartan (BENICAR) 20 MG tablet Take 1 tablet  Daily  for BP 90 tablet 3   OVER THE COUNTER MEDICATION 1 capsule daily. Naturebell Zinc Quercitin With Vitamin C and D3 (130  mg )     predniSONE (DELTASONE) 5 MG tablet Take 1 tablet by mouth twice a day or as directed for asthma and rheumatoid arthritis 180 tablet 0   rosuvastatin (CRESTOR) 20 MG tablet Take  1 tablet  Daily  for Cholesterol 90 tablet 3   terazosin (HYTRIN) 10 MG capsule Take  1 capsule  at Bedtime  for Prostate 90 capsule 3   terbinafine (LAMISIL) 250 MG tablet Take 1 tablet Daily for ToeNail Fungus & Athlete's Foot 30 tablet 0   vitamin B-12 (CYANOCOBALAMIN) 1000 MCG tablet Take 1,000 mcg by mouth 3 (three) times a week.     No current facility-administered medications for this encounter.    Allergies  Allergen Reactions   Ace Inhibitors    Asa [Aspirin]     High Dose  asprin   Penicillins Hives   Vasotec [Enalapril]     Cough   Clindamycin/Lincomycin     Hives     Social History   Socioeconomic History   Marital status: Married    Spouse name: Not on file   Number of children: Not on file   Years of education: Not on file   Highest education level: Not on file  Occupational History   Occupation: RETIRED    Employer: RETIRED  Tobacco Use   Smoking status: Former    Packs/day: 1.00    Years: 30.00    Total pack years: 30.00    Types: Cigarettes    Quit date: 07/11/1980    Years since quitting: 41.8   Smokeless tobacco: Current    Types: Chew  Vaping Use   Vaping Use: Never used  Substance and Sexual Activity   Alcohol use: No   Drug use: No   Sexual activity: Not on file  Other Topics Concern   Not on file  Social History Narrative   Not on file   Social Determinants of Health   Financial Resource Strain: Not on file  Food Insecurity: Not on file  Transportation Needs: Not on file  Physical Activity: Not on file  Stress: Not on file  Social Connections: Not on file  Intimate Partner Violence: Not on file    Family History  Problem Relation Age of Onset   Heart disease Father    Heart disease Brother    Colon cancer Brother 3    ROS- All systems are reviewed and negative except as per the HPI above  Physical Exam: There were no vitals filed for this visit. Wt Readings from Last 3 Encounters:  02/20/22 124.2 kg  12/06/21 123.4 kg  10/25/21 122.8 kg    Labs: Lab Results  Component Value Date   NA 141 10/25/2021   K 3.9 10/25/2021   CL 102 10/25/2021   CO2 31 10/25/2021   GLUCOSE 108 (H) 10/25/2021   BUN 21 10/25/2021   CREATININE 1.22 10/25/2021   CALCIUM 8.8 10/25/2021   PHOS 2.9 03/07/2021   MG 2.2 10/25/2021   Lab Results  Component Value Date   INR 1.0 03/05/2021   Lab Results  Component Value Date   CHOL 124 10/25/2021   HDL 39 (L) 10/25/2021   LDLCALC 58 10/25/2021   TRIG 194 (H)  10/25/2021     GEN- The patient is well appearing, alert and oriented x 3 today.   Head- normocephalic, atraumatic Eyes-  Sclera clear, conjunctiva pink Ears- hearing intact Oropharynx- clear Neck- supple, no JVP Lymph- no cervical lymphadenopathy Lungs- Clear to ausculation bilaterally, normal work  of breathing Heart- Regular rate and rhythm, no murmurs, rubs or gallops, PMI not laterally displaced GI- soft, NT, ND, + BS Extremities- no clubbing, cyanosis, or edema MS- no significant deformity or atrophy Skin- no rash or lesion Psych- euthymic mood, full affect Neuro- strength and sensation are intact  EKG-Vent. rate 80 BPM PR interval 168 ms QRS duration 148 ms QT/QTcB 442/509 ms P-R-T axes 66 108 25 Atrial-sensed ventricular-paced rhythm Abnormal ECG When compared with ECG of 02-Jul-2021 12:13, PREVIOUS ECG IS PRESENT    Assessment and Plan:  1. New onset afib Pt was not aware, found at time of paceart review for one hour 40 mins Will not add rate control today with paucity of episodes  Continue remote checks thru device clinic General education re afib and triggers  2. CHA2DS2VASc score of 3 Risk vrs benefit of anticoagulation discussed in detail Denies a bleeding history Will add eliquis 5 mg bid  30 day free card given   I was going to see back back in 3-4 weeks to f/u tolerance  and with a cbc/bmet for new start anticoagulation, but he has a f/u with his PCP, Dr. Darcus Pester that time, so that will suffice. Otherwise, f/u with Dr. Quentin Ore and remote checks as scheduled.   Geroge Baseman Vanna Shavers, [facility] [address] [place], [place] [address] [phone]

## 2022-05-02 ENCOUNTER — Encounter: Payer: PPO | Admitting: Internal Medicine

## 2022-05-04 DIAGNOSIS — C44612 Basal cell carcinoma of skin of right upper limb, including shoulder: Secondary | ICD-10-CM | POA: Diagnosis not present

## 2022-05-04 DIAGNOSIS — Z85828 Personal history of other malignant neoplasm of skin: Secondary | ICD-10-CM | POA: Diagnosis not present

## 2022-05-08 ENCOUNTER — Encounter: Payer: Self-pay | Admitting: Internal Medicine

## 2022-05-08 NOTE — Patient Instructions (Addendum)

## 2022-05-08 NOTE — Progress Notes (Unsigned)
Annual  Screening/Preventative Visit  & Comprehensive Evaluation & Examination   Future Appointments  Date Time Provider Department  05/09/2022               cpe 11:00 AM Unk Pinto, MD [facility]  10/26/2022  2:00 PM Alycia Rossetti, NP [facility]  05/11/2023               cpe 11:00 AM Unk Pinto, MD [facility]       This very nice 88 y.o.  WWM  with   HTN, HLD, Prediabetes,  IBS & GERD and Vitamin D Deficiency  presents for a Screening /Preventative Visit & comprehensive evaluation and management of multiple medical co-morbidities.  Patient has been followed for . Patient is followed  for Rheumatoid Arthritis (2010) in the past by Dr Berna Bue. &b currently he seems well controlled on low dose prednisone 5 mg which also benefits his chronic COPD /asthma for which he is followed by Dr Joelyn Oms for his COPD /Asthma.       HTN predates circa 1999. Patient's BP has been controlled at home.  Today's BP is at goal -  120/62. Patient has CKD3a (GFR 57).   In Feb 2023 , patient was admitted with Syncope  & had a PPM implanted for CHB by Dr. Lars Mage .  Patient denies any cardiac symptoms as chest pain, palpitations, shortness of breath, dizziness or ankle swelling.       Patient's hyperlipidemia is controlled with diet and Rosuvastatin. Patient denies myalgias or other medication SE's. Last lipids were at goal except elevated Trig's:  Lab Results  Component Value Date   CHOL 124 10/25/2021   HDL 39 (L) 10/25/2021   LDLCALC 58 10/25/2021   TRIG 194 (H) 10/25/2021   CHOLHDL 3.2 10/25/2021        Patient has Moderate  Obesity (BMI 33+) and  hx/o prediabetes (A1c 5.7% / 2013) and patient denies reactive hypoglycemic symptoms, visual blurring, diabetic polys or paresthesias. Last A1c was not at goal:  Lab Results  Component Value Date   HGBA1C 5.9 (H) 10/25/2021        Finally, patient has history of Vitamin D Deficiency  ("25" /2008) and last vitamin D  was slightly elevated & dose was  at goal :  Lab Results  Component Value Date   VD25OH 86 04/20/2021                                                              Current Outpatient Medications  Medication Instructions   apixaban (ELIQUIS)  5 mg 2 times daily   VITAMIN B12  1,000 mcg  Take 3 x / times week   finasteride (PROSCAR) 5 MG tablet Take  1 tablet  Daily  for Prostate   furosemide (LASIX) 40 MG tablet Take  1 tablet  Daily    Magnesium  500 mg  Daily   olmesartan (BENICAR) 20 MG tablet Take 1 tablet  Daily  for BP   Naturebell Zinc Quercitin w/ Vit C and D (130 mg ) 1 capsule Daily   predniSONE 5 MG tablet Take 1 tablet wice a day or as directed    rosuvastatin (CRESTOR) 20 MG tablet Take  1 tablet  Daily  for  Cholesterol   terazosin (HYTRIN) 10 MG capsule Take  1 capsule  at Bedtime  for Prostate   Vitamin D   5,000 Units  2 caps T,Th,S,S   & 1 cap MWF     Allergies  Allergen Reactions   Ace Inhibitors    Asa [Aspirin]     High Dose asprin   Penicillins Hives   Vasotec [Enalapril]     Cough   Clindamycin/Lincomycin     Hives   Past Medical History:  Diagnosis Date   Allergy    Asthma    BPH (benign prostatic hypertrophy)    BPH (benign prostatic hypertrophy)    COPD (chronic obstructive pulmonary disease) (HCC)    DJD (degenerative joint disease)    GERD (gastroesophageal reflux disease)    History of urinary retention    Hypertension    IBS (irritable bowel syndrome)    RBBB    Rheumatoid arthritis(714.0)    DR. HAWKES   Scrotal lesion    Short of breath on exertion    Health Maintenance  Topic Date Due   INFLUENZA VACCINE  12/14/2019   TETANUS/TDAP  04/15/2024   COVID-19 Vaccine  Completed   PNA vac Low Risk Adult  Completed   Immunization History  Administered Date(s) Administered   DT (Pediatric) 04/15/2014   Influenza, High Dose  02/18/2016, 02/04/2018   Influenza 03/10/2013, 02/07/2017, 02/13/2019   PFIZER SARS-COV-2 Vacc 05/27/2019,  06/15/2019   Pneumococcal -13 04/15/2014   Pneumococcal Po-23 05/15/2001, 03/30/2011   Td 05/15/2002   Zoster 04/01/2012   Last Colon - 09/28/2009 - Dr Deborha Payment - No f/u recommended due to age  Past Surgical History:  Procedure Laterality Date   CARDIOVASCULAR STRESS TEST  04-26-2011  DR BRACKBILL   LOW RISK STRESS NUCLEAR STUDY/ NO EVIDENCE ISCHEMIA   LUMBAR FUSION  X3  LAST ONE 1996   SCROTAL EXPLORATION Left 07/17/2012   Procedure:  EXCISION  OF LEFT SCROTAL SKIN LESION;  Surgeon: Molli Hazard, MD;  Location: [facility];  Service: Urology;  Laterality: Left;   TRANSTHORACIC ECHOCARDIOGRAM  04-26-2011     MILD LVH/ LVSF NORMAL/ EF 40-10%/ GRADE I DIASTOLIC DYSFUNCTION/ MILD MITRAL REGURG.   TRANSURETHRAL RESECTION OF PROSTATE  SEVERAL YRS AGO   Family History  Problem Relation Age of Onset   Heart disease Father    Colon cancer Father    Hypertension Father    Heart disease Brother    Diabetes Sister    Social History   Socioeconomic History   Marital status: widowed   Number of children: 2 daughters & 5 GC  Occupational History   Occupation: Retired Administrator  Tobacco Use   Smoking status: Former Smoker    Packs/day: 1.00    Years: 30.00    Pack years: 30.00    Types: Cigarettes    Quit date: 07/11/1980    Years since quitting: 39.6   Smokeless tobacco: Former Systems developer    Types: Chew  Substance and Sexual Activity   Alcohol use: No   Drug use: No   Sexual activity: Not on file   ROS Constitutional: Denies fever, chills, weight loss/gain, headaches, insomnia,  night sweats or change in appetite. Does c/o fatigue. Eyes: Denies redness, blurred vision, diplopia, discharge, itchy or watery eyes.  ENT: Denies discharge, congestion, post nasal drip, epistaxis, sore throat, earache, hearing loss, dental pain, Tinnitus, Vertigo, Sinus pain or snoring.  Cardio: Denies chest pain, palpitations, irregular heartbeat, syncope, dyspnea, diaphoresis,  orthopnea, PND, claudication or edema Respiratory: denies cough, dyspnea, DOE, pleurisy, hoarseness, laryngitis or wheezing.  Gastrointestinal: Denies dysphagia, heartburn, reflux, water brash, pain, cramps, nausea, vomiting, bloating, diarrhea, constipation, hematemesis, melena, hematochezia, jaundice or hemorrhoids Genitourinary: Denies dysuria, frequency, urgency, nocturia, hesitancy, discharge, hematuria or flank pain Musculoskeletal: Denies arthralgia, myalgia, stiffness, Jt. Swelling, pain, limp or strain/sprain. Denies Falls. Skin: Denies puritis, rash, hives, warts, acne, eczema or change in skin lesion Neuro: No weakness, tremor, incoordination, spasms, paresthesia or pain Psychiatric: Denies confusion, memory loss or sensory loss. Denies Depression. Endocrine: Denies change in weight, skin, hair change, nocturia, and paresthesia, diabetic polys, visual blurring or hyper / hypo glycemic episodes.  Heme/Lymph: No excessive bleeding, bruising or enlarged lymph nodes.  Physical Exam  There were no vitals taken for this visit.  General Appearance: Well nourished and well groomed and in no apparent distress.  Eyes: PERRLA, EOMs, conjunctiva no swelling or erythema, normal fundi and vessels. Sinuses: No frontal/maxillary tenderness ENT/Mouth: EACs patent / TMs  nl. Nares clear without erythema, swelling, mucoid exudates. Oral hygiene is good. No erythema, swelling, or exudate. Tongue normal, non-obstructing. Tonsils not swollen or erythematous. Hearing normal.  Neck: Supple, thyroid not palpable. No bruits, nodes or JVD. Respiratory: Respiratory effort normal.  BS equal and clear bilateral without rales, rhonci, wheezing or stridor. Cardio: Heart sounds are normal with regular rate and rhythm and no murmurs, rubs or gallops. Peripheral pulses are normal and equal bilaterally without edema. No aortic or femoral bruits. Chest: symmetric with normal excursions and percussion.  Abdomen: Soft,  with Nl bowel sounds. Nontender, no guarding, rebound, hernias, masses, or organomegaly.  Lymphatics: Non tender without lymphadenopathy.  Musculoskeletal: Full ROM all peripheral extremities, joint stability, 5/5 strength, and normal gait. Skin: Warm and dry without rashes, lesions, cyanosis, clubbing or  ecchymosis.  Neuro: Cranial nerves intact, reflexes equal bilaterally. Normal muscle tone, no cerebellar symptoms. Sensation intact.  Pysch: Alert and oriented X 3 with normal affect, insight and judgment appropriate.   Assessment and Plan  1. Annual Preventative/Screening Exam    2. Essential hypertension  - EKG 12-Lead - [place], RETROPERITNL ABD,  LTD - Urinalysis, Routine w reflex microscopic - Microalbumin / creatinine urine ratio - CBC with Differential/Platelet - COMPLETE METABOLIC PANEL WITH GFR - Magnesium - TSH  3. Hyperlipidemia, mixed  - EKG 12-Lead - [place], RETROPERITNL ABD,  LTD - Lipid panel - TSH  4. Abnormal glucose  - EKG 12-Lead - [place], RETROPERITNL ABD,  LTD - Hemoglobin A1c - Insulin, random  5. Vitamin D deficiency  - VITAMIN D 25 Hydroxy   6. S/P placement of cardiac pacemaker  - EKG 12-Lead  7. Heart block AV complete (HCC) - s/p PPM 07/03/2021  - EKG 12-Lead  8. Aortic atherosclerosis ([place]) by CT scan 2011  - [place], RETROPERITNL ABD,  LTD - Urinalysis, Routine w reflex microscopic - Lipid panel  9. Chronic obstructive pulmonary disease ([place])   10. B12 deficiency  - Vitamin B12 - CBC with Differential/Platelet  11. BPH with obstruction/lower urinary tract symptoms  - PSA  12. Rheumatoid arthritis involving multiple sites with positive rheumatoid factor ([place])   13. Stage 3a chronic kidney disease (HCC)  - Urinalysis, Routine w reflex microscopic - Microalbumin / creatinine urine ratio - PTH, intact and calcium  14. Screening for colorectal cancer  - POC Hemoccult Bld/Stl   15. Prostate cancer screening  - PSA  16.  Screening for heart disease  - EKG 12-Lead  17. Former smoker  -  EKG 12-Lead - [place], RETROPERITNL ABD,  LTD  18. Screening for AAA (aortic abdominal aneurysm)  - [place], RETROPERITNL ABD,  LTD  19. Medication management  - Vitamin B12 - CBC with Differential/Platelet - COMPLETE METABOLIC PANEL WITH GFR - Magnesium - Lipid panel - TSH - Hemoglobin A1c - Insulin, random - VITAMIN D 25 Hydroxy          Patient was counseled in prudent diet, weight control to achieve/maintain BMI less than 25, BP monitoring, regular exercise and medications as discussed.  Discussed med effects and SE's. Routine screening labs and tests as requested with regular follow-up as recommended. Over 40 minutes of exam, counseling, chart review and high complex critical decision making was performed   Kirtland Bouchard, MD

## 2022-05-09 ENCOUNTER — Encounter: Payer: Self-pay | Admitting: Internal Medicine

## 2022-05-09 ENCOUNTER — Ambulatory Visit (INDEPENDENT_AMBULATORY_CARE_PROVIDER_SITE_OTHER): Payer: PPO | Admitting: Internal Medicine

## 2022-05-09 VITALS — BP 126/62 | HR 84 | Temp 97.5°F | Resp 16 | Ht 76.0 in | Wt 272.0 lb

## 2022-05-09 DIAGNOSIS — Z87891 Personal history of nicotine dependence: Secondary | ICD-10-CM

## 2022-05-09 DIAGNOSIS — Z0000 Encounter for general adult medical examination without abnormal findings: Secondary | ICD-10-CM

## 2022-05-09 DIAGNOSIS — R7309 Other abnormal glucose: Secondary | ICD-10-CM | POA: Diagnosis not present

## 2022-05-09 DIAGNOSIS — Z950 Presence of cardiac pacemaker: Secondary | ICD-10-CM

## 2022-05-09 DIAGNOSIS — N138 Other obstructive and reflux uropathy: Secondary | ICD-10-CM | POA: Diagnosis not present

## 2022-05-09 DIAGNOSIS — E559 Vitamin D deficiency, unspecified: Secondary | ICD-10-CM | POA: Diagnosis not present

## 2022-05-09 DIAGNOSIS — Z1211 Encounter for screening for malignant neoplasm of colon: Secondary | ICD-10-CM

## 2022-05-09 DIAGNOSIS — E782 Mixed hyperlipidemia: Secondary | ICD-10-CM

## 2022-05-09 DIAGNOSIS — N1831 Chronic kidney disease, stage 3a: Secondary | ICD-10-CM

## 2022-05-09 DIAGNOSIS — Z0001 Encounter for general adult medical examination with abnormal findings: Secondary | ICD-10-CM

## 2022-05-09 DIAGNOSIS — Z136 Encounter for screening for cardiovascular disorders: Secondary | ICD-10-CM | POA: Diagnosis not present

## 2022-05-09 DIAGNOSIS — I10 Essential (primary) hypertension: Secondary | ICD-10-CM

## 2022-05-09 DIAGNOSIS — I700 Atherosclerosis of aorta: Secondary | ICD-10-CM

## 2022-05-09 DIAGNOSIS — E538 Deficiency of other specified B group vitamins: Secondary | ICD-10-CM

## 2022-05-09 DIAGNOSIS — M0579 Rheumatoid arthritis with rheumatoid factor of multiple sites without organ or systems involvement: Secondary | ICD-10-CM

## 2022-05-09 DIAGNOSIS — Z125 Encounter for screening for malignant neoplasm of prostate: Secondary | ICD-10-CM

## 2022-05-09 DIAGNOSIS — I442 Atrioventricular block, complete: Secondary | ICD-10-CM

## 2022-05-09 DIAGNOSIS — N401 Enlarged prostate with lower urinary tract symptoms: Secondary | ICD-10-CM | POA: Diagnosis not present

## 2022-05-09 DIAGNOSIS — J449 Chronic obstructive pulmonary disease, unspecified: Secondary | ICD-10-CM

## 2022-05-09 DIAGNOSIS — Z79899 Other long term (current) drug therapy: Secondary | ICD-10-CM | POA: Diagnosis not present

## 2022-05-10 LAB — LIPID PANEL
Cholesterol: 135 mg/dL (ref <200)
HDL: 42 mg/dL (ref >=40)
LDL Cholesterol (Calc): 63 mg/dL (calc)
Non-HDL Cholesterol (Calc): 93 mg/dL (calc) (ref <130)
Total CHOL/HDL Ratio: 3.2 (calc) (ref <5.0)
Triglycerides: 257 mg/dL — ABNORMAL HIGH (ref <150)

## 2022-05-10 LAB — CBC WITH DIFFERENTIAL/PLATELET
Absolute Monocytes: 730 cells/uL (ref 200–950)
Basophils Absolute: 34 cells/uL (ref 0–200)
Basophils Relative: 0.3 %
Eosinophils Absolute: 57 cells/uL (ref 15–500)
Eosinophils Relative: 0.5 %
HCT: 38.2 % — ABNORMAL LOW (ref 38.5–50.0)
Hemoglobin: 13 g/dL — ABNORMAL LOW (ref 13.2–17.1)
Lymphs Abs: 1813 cells/uL (ref 850–3900)
MCH: 31.5 pg (ref 27.0–33.0)
MCHC: 34 g/dL (ref 32.0–36.0)
MCV: 92.5 fL (ref 80.0–100.0)
MPV: 9.7 fL (ref 7.5–12.5)
Monocytes Relative: 6.4 %
Neutro Abs: 8767 cells/uL — ABNORMAL HIGH (ref 1500–7800)
Neutrophils Relative %: 76.9 %
Platelets: 206 10*3/uL (ref 140–400)
RBC: 4.13 10*6/uL — ABNORMAL LOW (ref 4.20–5.80)
RDW: 12.6 % (ref 11.0–15.0)
Total Lymphocyte: 15.9 %
WBC: 11.4 10*3/uL — ABNORMAL HIGH (ref 3.8–10.8)

## 2022-05-10 LAB — URINALYSIS, ROUTINE W REFLEX MICROSCOPIC
Bilirubin Urine: NEGATIVE
Glucose, UA: NEGATIVE
Hgb urine dipstick: NEGATIVE
Ketones, ur: NEGATIVE
Leukocytes,Ua: NEGATIVE
Nitrite: NEGATIVE
Protein, ur: NEGATIVE
Specific Gravity, Urine: 1.008 (ref 1.001–1.035)
pH: 7 (ref 5.0–8.0)

## 2022-05-10 LAB — COMPLETE METABOLIC PANEL WITH GFR
AG Ratio: 1.3 (calc) (ref 1.0–2.5)
ALT: 15 U/L (ref 9–46)
AST: 16 U/L (ref 10–35)
Albumin: 3.7 g/dL (ref 3.6–5.1)
Alkaline phosphatase (APISO): 73 U/L (ref 35–144)
BUN: 18 mg/dL (ref 7–25)
CO2: 32 mmol/L (ref 20–32)
Calcium: 8.9 mg/dL (ref 8.6–10.3)
Chloride: 104 mmol/L (ref 98–110)
Creat: 1.2 mg/dL (ref 0.70–1.22)
Globulin: 2.8 g/dL (calc) (ref 1.9–3.7)
Glucose, Bld: 98 mg/dL (ref 65–99)
Potassium: 4.1 mmol/L (ref 3.5–5.3)
Sodium: 145 mmol/L (ref 135–146)
Total Bilirubin: 0.6 mg/dL (ref 0.2–1.2)
Total Protein: 6.5 g/dL (ref 6.1–8.1)
eGFR: 58 mL/min/{1.73_m2} — ABNORMAL LOW (ref >=60)

## 2022-05-10 LAB — PSA: PSA: 0.39 ng/mL (ref <=4.00)

## 2022-05-10 LAB — MICROALBUMIN / CREATININE URINE RATIO
Creatinine, Urine: 32 mg/dL (ref 20–320)
Microalb Creat Ratio: 44 mcg/mg creat — ABNORMAL HIGH (ref <30)
Microalb, Ur: 1.4 mg/dL

## 2022-05-10 LAB — HEMOGLOBIN A1C
Hgb A1c MFr Bld: 5.9 % of total Hgb — ABNORMAL HIGH (ref <5.7)
Mean Plasma Glucose: 123 mg/dL
eAG (mmol/L): 6.8 mmol/L

## 2022-05-10 LAB — TSH: TSH: 1.83 mIU/L (ref 0.40–4.50)

## 2022-05-10 LAB — VITAMIN D 25 HYDROXY (VIT D DEFICIENCY, FRACTURES): Vit D, 25-Hydroxy: 63 ng/mL (ref 30–100)

## 2022-05-10 LAB — INSULIN, RANDOM: Insulin: 33.1 u[IU]/mL — ABNORMAL HIGH

## 2022-05-10 LAB — PTH, INTACT AND CALCIUM
Calcium: 8.9 mg/dL (ref 8.6–10.3)
PTH: 97 pg/mL — ABNORMAL HIGH (ref 16–77)

## 2022-05-10 LAB — MAGNESIUM: Magnesium: 2.1 mg/dL (ref 1.5–2.5)

## 2022-05-10 LAB — VITAMIN B12: Vitamin B-12: 465 pg/mL (ref 200–1100)

## 2022-05-10 NOTE — Progress Notes (Signed)
<><><><><><><><><><><><><><><><><><><><><><><><><><><><><><><><><> <><><><><><><><><><><><><><><><><><><><><><><><><><><><><><><><><> - Test results slightly outside the reference range are not unusual. If there is anything important, I will review this with you,  otherwise it is considered normal test values.  If you have further questions,  please do not hesitate to contact me at the office or via My Chart.  <><><><><><><><><><><><><><><><><><><><><><><><><><><><><><><><><> <><><><><><><><><><><><><><><><><><><><><><><><><><><><><><><><><>  -  Total chol =   135   &  LDL Chol  = 63   - Both   Excellent   - Very low risk for Heart Attack  / Stroke <><><><><><><><><><><><><><><><><><><><><><><><><><><><><><><><><> <><><><><><><><><><><><><><><><><><><><><><><><><><><><><><><><><>  -  But Triglycerides (    257     ) or fats in blood are too high                 (   Ideal or  Goal is less than 150  !  )    - Recommend avoid fried & greasy foods,  sweets / candy,   - Avoid white rice  (brown or wild rice or Quinoa is OK),   - Avoid white potatoes  (sweet potatoes are OK)   - Avoid anything made from white flour  - bagels, doughnuts, rolls, buns, biscuits, white and   wheat breads, pizza crust and traditional  pasta made of white flour & egg white  - (vegetarian pasta or spinach or wheat pasta is OK).    - Multi-grain bread is OK - like multi-grain flat bread or  sandwich thins.   - Avoid alcohol in excess.   - Exercise is also important. <><><><><><><><><><><><><><><><><><><><><><><><><><><><><><><><><> <><><><><><><><><><><><><><><><><><><><><><><><><><><><><><><><><>  -  A1c = 5.9%   Still Blood sugar and A1c are elevated                               in the borderline and early or pre-diabetes range which has the same   300% increased risk for                                 heart attack, stroke, cancer and                                               alzheimer-  type vascular dementia as full blown diabetes.   But the good news is that diet, exercise with weight loss can                                                                                 cure the early diabetes at this point. <><><><><><><><><><><><><><><><><><><><><><><><><><><><><><><><><> <><><><><><><><><><><><><><><><><><><><><><><><><><><><><><><><><>  -  PTH hormone that regulates calcium balance is a little elevated,                                               but calcium level is normal , so will continue to monitor  <><><><><><><><><><><><><><><><><><><><><><><><><><><><><><><><><> <><><><><><><><><><><><><><><><><><><><><><><><><><><><><><><><><>  -  Vitamin B12  is Normal =- So please continue your Vitamin B12 supplement <><><><><><><><><><><><><><><><><><><><><><><><><><><><><><><><><> <><><><><><><><><><><><><><><><><><><><><><><><><><><><><><><><><>  -  PSA - Very low  - Great  ! <><><><><><><><><><><><><><><><><><><><><><><><><><><><><><><><><> <><><><><><><><><><><><><><><><><><><><><><><><><><><><><><><><><>  -  Vitamin D= 63 - Excellent   <><><><><><><><><><><><><><><><><><><><><><><><><><><><><><><><><> <><><><><><><><><><><><><><><><><><><><><><><><><><><><><><><><><>  -   All Else - CBC - Kidneys - Electrolytes - Liver - Magnesium & Thyroid    - all  Normal / OK <><><><><><><><><><><><><><><><><><><><><><><><><><><><><><><><><> <><><><><><><><><><><><><><><><><><><><><><><><><><><><><><><><><>  -  Keep up the Great Work   <><><><><><><><><><><><><><><><><><><><><><><><><><><><><><><><><> <><><><><><><><><><><><><><><><><><><><><><><><><><><><><><><><><>

## 2022-05-16 NOTE — Progress Notes (Signed)
Remote pacemaker transmission.   

## 2022-05-23 ENCOUNTER — Telehealth: Payer: Self-pay

## 2022-05-23 NOTE — Telephone Encounter (Signed)
LM-05/23/22-Chart review started. Reviewing OV, Consults, Hospital visits, Labs and medication changes.  Chart review complete. Spoke to pt. And completed a General Review Call. Pt. Has no health or medication concerns to discuss. (21 min.)

## 2022-06-07 DIAGNOSIS — Z85828 Personal history of other malignant neoplasm of skin: Secondary | ICD-10-CM | POA: Diagnosis not present

## 2022-06-07 DIAGNOSIS — L820 Inflamed seborrheic keratosis: Secondary | ICD-10-CM | POA: Diagnosis not present

## 2022-06-07 DIAGNOSIS — D1801 Hemangioma of skin and subcutaneous tissue: Secondary | ICD-10-CM | POA: Diagnosis not present

## 2022-06-07 DIAGNOSIS — L821 Other seborrheic keratosis: Secondary | ICD-10-CM | POA: Diagnosis not present

## 2022-06-07 DIAGNOSIS — L570 Actinic keratosis: Secondary | ICD-10-CM | POA: Diagnosis not present

## 2022-06-14 DIAGNOSIS — J4520 Mild intermittent asthma, uncomplicated: Secondary | ICD-10-CM | POA: Diagnosis not present

## 2022-06-14 DIAGNOSIS — J449 Chronic obstructive pulmonary disease, unspecified: Secondary | ICD-10-CM | POA: Diagnosis not present

## 2022-06-14 DIAGNOSIS — Z79899 Other long term (current) drug therapy: Secondary | ICD-10-CM | POA: Diagnosis not present

## 2022-06-22 ENCOUNTER — Encounter (HOSPITAL_COMMUNITY): Payer: Self-pay | Admitting: *Deleted

## 2022-07-03 ENCOUNTER — Ambulatory Visit: Payer: PPO

## 2022-07-03 DIAGNOSIS — I442 Atrioventricular block, complete: Secondary | ICD-10-CM

## 2022-07-03 LAB — CUP PACEART REMOTE DEVICE CHECK
Battery Remaining Longevity: 115 mo
Battery Voltage: 3.03 V
Brady Statistic AP VP Percent: 29.26 %
Brady Statistic AP VS Percent: 0.08 %
Brady Statistic AS VP Percent: 69.4 %
Brady Statistic AS VS Percent: 1.26 %
Brady Statistic RA Percent Paced: 29.43 %
Brady Statistic RV Percent Paced: 98.65 %
Date Time Interrogation Session: 20240218182621
Implantable Lead Connection Status: 753985
Implantable Lead Connection Status: 753985
Implantable Lead Implant Date: 20230219
Implantable Lead Implant Date: 20230219
Implantable Lead Location: 753859
Implantable Lead Location: 753860
Implantable Lead Model: 3830
Implantable Lead Model: 5076
Implantable Pulse Generator Implant Date: 20230219
Lead Channel Impedance Value: 342 Ohm
Lead Channel Impedance Value: 399 Ohm
Lead Channel Impedance Value: 513 Ohm
Lead Channel Impedance Value: 513 Ohm
Lead Channel Pacing Threshold Amplitude: 0.625 V
Lead Channel Pacing Threshold Amplitude: 1.5 V
Lead Channel Pacing Threshold Pulse Width: 0.4 ms
Lead Channel Pacing Threshold Pulse Width: 0.4 ms
Lead Channel Sensing Intrinsic Amplitude: 11.625 mV
Lead Channel Sensing Intrinsic Amplitude: 11.625 mV
Lead Channel Sensing Intrinsic Amplitude: 5.75 mV
Lead Channel Sensing Intrinsic Amplitude: 5.75 mV
Lead Channel Setting Pacing Amplitude: 1.5 V
Lead Channel Setting Pacing Amplitude: 3 V
Lead Channel Setting Pacing Pulse Width: 0.4 ms
Lead Channel Setting Sensing Sensitivity: 1.2 mV
Zone Setting Status: 755011

## 2022-07-05 ENCOUNTER — Other Ambulatory Visit: Payer: Self-pay | Admitting: Nurse Practitioner

## 2022-07-05 DIAGNOSIS — E782 Mixed hyperlipidemia: Secondary | ICD-10-CM

## 2022-07-07 ENCOUNTER — Other Ambulatory Visit: Payer: Self-pay | Admitting: Internal Medicine

## 2022-07-07 DIAGNOSIS — J449 Chronic obstructive pulmonary disease, unspecified: Secondary | ICD-10-CM

## 2022-07-07 DIAGNOSIS — M0579 Rheumatoid arthritis with rheumatoid factor of multiple sites without organ or systems involvement: Secondary | ICD-10-CM

## 2022-07-07 MED ORDER — PREDNISONE 5 MG PO TABS: ORAL_TABLET | ORAL | 3 refills | Status: AC

## 2022-07-07 MED ORDER — PREDNISONE 5 MG PO TABS: ORAL_TABLET | ORAL | 3 refills | Status: DC

## 2022-07-19 DIAGNOSIS — H2513 Age-related nuclear cataract, bilateral: Secondary | ICD-10-CM | POA: Diagnosis not present

## 2022-07-19 DIAGNOSIS — H40033 Anatomical narrow angle, bilateral: Secondary | ICD-10-CM | POA: Diagnosis not present

## 2022-07-19 DIAGNOSIS — H53022 Refractive amblyopia, left eye: Secondary | ICD-10-CM | POA: Diagnosis not present

## 2022-07-26 DIAGNOSIS — H40033 Anatomical narrow angle, bilateral: Secondary | ICD-10-CM | POA: Diagnosis not present

## 2022-08-07 NOTE — Progress Notes (Unsigned)
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 Call if greater than 130/80.   Chronic obstructive pulmonary disease, unspecified COPD type ([place]) Pulm follows, on low dose prednisone Failed numerous inhalers;  Remote smoker; normal CXR 02/2021 Declines further med changes at this visit   Rheumatoid arthritis involving multiple sites with positive rheumatoid factor ([place]) Continue follow up, low dose prednisone   Morbid Obesity ([place]) - BMI 30+ with COPD - follow up 3 months for progress monitoring - increase veggies, decrease carbs - long discussion about weight loss, diet, and exercise  Mixed hyperlipidemia -continue medications, check lipids, decrease fatty foods, increase activity.   Prediabetes Discussed disease and risks Discussed diet/exercise, weight management  Check A1C q69m;   Medication management CBC, CMP/GFR, magnesium  Vitamin D deficiency Continue supplement  Benign prostatic hyperplasia with lower urinary tract symptoms, symptom details unspecified Monitor symptoms; controlled on finasteride and hytrin  Irritable bowel syndrome, unspecified type Encourage soluble fiber; monitor  Gastroesophageal reflux disease, esophagitis presence not specified Well managed by lifestyle Discussed diet, avoiding triggers and other lifestyle changes  Uncomplicated asthma, unspecified asthma severity, unspecified whether persistent Has seen pulm, denies wheezing on low dose prednisone  Syncope Denies recurrence since pacemaker   B12 deficiency  - newly on supplement  - B12  Lumbar back pain Chronic, without radicular sx Appears muscular in etiology Declines  medications, imaging or PT at this time Encouraged weight loss, regular gentle exercises, walking He can try tylenol, heat if needed -   Onychomycosis - sent in 90 day course of lamisil to restart - recheck LFTs in 6 weeks by [place]  No orders of the defined types were placed in this encounter.   Over 30 minutes of exam, counseling, chart review, and critical decision making was performed  Future Appointments  Date Time Provider [place]  08/08/2022  3:00 PM Alycia Rossetti, NP [facility] None  10/02/2022  7:05 AM [facility] DEVICE REMOTES [facility]  10/26/2022  2:00 PM Alycia Rossetti, NP [facility] None  01/01/2023  7:05 AM [facility] DEVICE REMOTES [facility]  04/02/2023  7:05 AM [facility] DEVICE REMOTES [facility]  05/11/2023 10:00 AM Unk Pinto, MD [facility] None  07/02/2023  7:05 AM [facility] DEVICE REMOTES [facility]  10/01/2023  7:05 AM [facility] DEVICE REMOTES [facility]     Plan:   During the course of the visit the patient was educated and counseled about appropriate screening and preventive services including:   Pneumococcal vaccine  Influenza vaccine Prevnar 13 Td vaccine Screening electrocardiogram Colorectal cancer screening Diabetes screening Glaucoma screening Nutrition counseling    Subjective:  [name] is a 89 y.o. male who presents for AWV and follow up. He has Rheumatoid arthritis ([place]); GERD (gastroesophageal reflux disease); DJD (degenerative joint disease); IBS (irritable bowel syndrome); COPD (chronic obstructive pulmonary disease) ([place]); Benign prostatic hyperplasia; Asthma; Essential hypertension; Hyperlipidemia, mixed; Abnormal glucose; Vitamin D deficiency; Medication management; Morbid obesity ([place])- BMI 30+ with COPD; Aortic atherosclerosis ([place]) by CT scan 2011; History of skin cancer; B12 deficiency; Heart block AV complete ([place]) - s/p PPM  07/03/2021; S/P placement of cardiac pacemaker; Lumbar back pain; and CKD (chronic kidney disease) stage 3, GFR 30-59 ml/min (HCC) on  their problem list.  He is recently widowed, 2 children, daughter Claiborne Billings is with him today, 5 grand kids, 8 great grands. He is a retired Administrator.   He had syncope, symptomatic brady/ CHB in 06/2021, was admitted for pacemaker placement on 07/03/2021 by Dr. Lars Mage and has done well since without syncopal episodes. He has had some edema, started on lasix 40 mg daily and has been stable since. ECHO 07/03/2021 showed LVEF 60-65%, mild concentric LVH, grade 1 diastolic dysfunction, no valvular abnormalities.   He is a former smoker, 30 pack years quit in 1982; he has COPD and asthma, has seen Dr. Londell Moh, on prednisone 5 mg daily for many years. He tried symbicort and stiolto with apprent lack of perceived benefit. He report stable exertional dyspnea.   He has seen Dr Gavin Pound for Rheumatoid Arthritis (2010) in the past, recently treatment by daily low dose steroid.   GERD and IBS are recently well controlled by diet.   Hx of SCC/BCC, follows with derm q22m.   He reports Dr. Melford Aase prescribed terbinafine last visit, 90 days but pharmacy only gave him 24, does think this helped his sx.   BMI is There is no height or weight on file to calculate BMI., he is not working on diet or exercise, has some back pain after walking a block. Denies radicular sx, not interested in treating or diagnosing this.  Wt Readings from Last 3 Encounters:  05/09/22 272 lb (123.4 kg)  04/26/22 272 lb 12.8 oz (123.7 kg)  02/20/22 273 lb 12.8 oz (124.2 kg)   His blood pressure has been controlled at home, today their BP is   He does not workout. He denies chest pain, shortness of breath, dizziness.   He has aortic atherosclerosis per CT in 2011.   He is on cholesterol medication and denies myalgias. His cholesterol is at goal. The cholesterol last visit was:   Lab Results   Component Value Date   CHOL 135 05/09/2022   HDL 42 05/09/2022   LDLCALC 63 05/09/2022   TRIG 257 (H) 05/09/2022   CHOLHDL 3.2 05/09/2022   He has not been working on diet/exercise for prediabetes. He denies diabetic polys . Last A1C in the office was:  Lab Results  Component Value Date   HGBA1C 5.9 (H) 05/09/2022   Last GFR Lab Results  Component Value Date   EGFR 58 (L) 05/09/2022   EGFR 57 (L) 10/25/2021   EGFR 59 (L) 09/13/2021   Patient is on Vitamin D supplement, taking 5000 IU daily  Lab Results  Component Value Date   VD25OH 19 05/09/2022     He is newly taking 1000 mcg tab 3 days a week  Lab Results  Component Value Date   VITAMINB12 465 05/09/2022   He has BPH LUTs resolved, hx of TURP, he is on finasteride 5 mg and hytrin daily.  Lab Results  Component Value Date   PSA 0.39 05/09/2022   PSA 0.28 04/20/2021   PSA 0.27 03/09/2020       Medication Review: Current Outpatient Medications on File Prior to Visit  Medication Sig Dispense Refill   apixaban (ELIQUIS) 5 MG TABS tablet Take 1 tablet (5 mg total) by mouth 2 (two) times daily. 180 tablet 1   Cholecalciferol (VITAMIN D) 125 MCG (5000 UT) CAPS Take 5,000 Units by mouth as directed. T,Th,Sat,Sun 1 per day 2 per day other days of the week     finasteride (PROSCAR) 5 MG  tablet Take  1 tablet  Daily  for Prostate 90 tablet 3   furosemide (LASIX) 40 MG tablet Take  1 tablet  Daily  for Fluid Retention / Edema 90 tablet 3   magnesium gluconate (MAGONATE) 500 MG tablet Take 500 mg by mouth daily.     olmesartan (BENICAR) 20 MG tablet Take 1 tablet  Daily  for BP 90 tablet 3   OVER THE COUNTER MEDICATION 1 capsule daily. [employer] Zinc Quercitin With Vitamin C and D3 (130 mg )     predniSONE (DELTASONE) 5 MG tablet Take 1 tablet by mouth twice a day or as directed for asthma and rheumatoid arthritis 180 tablet 3   rosuvastatin (CRESTOR) 20 MG tablet Take 1 tablet by mouth daily for cholesterol 90 tablet 2    terazosin (HYTRIN) 10 MG capsule Take  1 capsule  at Bedtime  for Prostate 90 capsule 3   vitamin B-12 (CYANOCOBALAMIN) 1000 MCG tablet Take 1,000 mcg by mouth 3 (three) times a week.     No current facility-administered medications on file prior to visit.    Allergies: Allergies  Allergen Reactions   Ace Inhibitors    Asa [Aspirin]     High Dose asprin   Penicillins Hives   Vasotec [Enalapril]     Cough   Clindamycin/Lincomycin     Hives     Current Problems (verified) has Rheumatoid arthritis (HCC); GERD (gastroesophageal reflux disease); DJD (degenerative joint disease); IBS (irritable bowel syndrome); COPD (chronic obstructive pulmonary disease) ([place]); Benign prostatic hyperplasia; Asthma; Essential hypertension; Hyperlipidemia, mixed; Abnormal glucose; Vitamin D deficiency; Medication management; Morbid obesity ([place])- BMI 30+ with COPD; Aortic atherosclerosis ([place]) by CT scan 2011; History of skin cancer; B12 deficiency; Heart block AV complete ([place]) - s/p PPM 07/03/2021; S/P placement of cardiac pacemaker; Lumbar back pain; and CKD (chronic kidney disease) stage 3, GFR 30-59 ml/min (HCC) on their problem list.  Screening Tests Immunization History  Administered Date(s) Administered   DT (Pediatric) 04/15/2014   Fluad Quad(high Dose 65+) 02/15/2022   Influenza, High Dose Seasonal PF 04/15/2014, 02/16/2015, 02/18/2016, 02/04/2018, 03/09/2020, 03/11/2021   Influenza-Unspecified 03/10/2013, 02/07/2017, 02/13/2019   PFIZER(Purple Top)SARS-COV-2 Vaccination 05/27/2019, 06/15/2019, 03/17/2020   Pneumococcal Conjugate-13 04/15/2014   Pneumococcal Polysaccharide-23 05/15/2001, 03/30/2011   Td 05/15/2002   Zoster, Live 04/01/2012   Health Maintenance  Topic Date Due   Zoster Vaccines- Shingrix (1 of 2) Never done   COVID-19 Vaccine (4 - 2023-24 season) 01/13/2022   Medicare Annual Wellness (AWV)  10/26/2022   DTaP/Tdap/Td (3 - Tdap) 04/15/2024   Pneumonia Vaccine 97+ Years old   Completed   INFLUENZA VACCINE  Completed   HPV VACCINES  Aged Out   Last colonoscopy: unknown, age/declines another  Names of Other Physician/Practitioners you currently use: 1. [facility] here for primary care 2. Dr. Katy Fitch, eye doctor, last visit 2018, wears glasses, encouraged to schedule 3. Dr. Rip Harbour, dentist, last visit 2022, goes q29m 4. Dr. Sarajane Jews, derm, last visit 2023, goes q25m, hx SCC/BCC  Patient Care Team: Unk Pinto, MD as PCP - General (Internal Medicine) Vickie Epley, MD as PCP - Electrophysiology (Cardiology) Newton Pigg, [facility] (Inactive) as Pharmacist (Pharmacist)  Surgical: He  has a past surgical history that includes Transurethral resection of prostate (SEVERAL YRS AGO); transthoracic echocardiogram (04-26-2011  ); Cardiovascular stress test (04-26-2011  DR BRACKBILL); Lumbar fusion (X3  LAST ONE 1996); Scrotal exploration (Left, 07/17/2012); LOOP RECORDER INSERTION (N/A, 03/07/2021); PACEMAKER IMPLANT (N/A, 07/03/2021); and  LOOP RECORDER REMOVAL (N/A, 07/03/2021). Family His family history includes Colon cancer (age of onset: 55) in his brother; Heart disease in his brother and father. Social history  He reports that he quit smoking about 42 years ago. His smoking use included cigarettes. He has a 30.00 pack-year smoking history. His smokeless tobacco use includes chew. He reports that he does not drink alcohol and does not use drugs.  MEDICARE WELLNESS OBJECTIVES: Physical activity:   Cardiac risk factors:   Depression/mood screen:      05/08/2022   10:58 PM  Depression screen PHQ 2/9  Decreased Interest 0  Down, Depressed, Hopeless 0  PHQ - 2 Score 0    ADLs:     05/08/2022   10:58 PM 10/25/2021    3:01 PM  In your present state of health, do you have any difficulty performing the following activities:  Hearing? 0 0  Vision? 0 0  Difficulty concentrating or making decisions? 0 0  Walking or climbing  stairs? 0 0  Dressing or bathing? 0 0  Doing errands, shopping? 0 0     Cognitive Testing  Alert? Yes  Normal Appearance?Yes  Oriented to person? Yes  Place? Yes   Time? Yes  Recall of three objects?  Yes  Can perform simple calculations? Yes  Displays appropriate judgment?Yes  Can read the correct time from a watch face?Yes  EOL planning:        Review of Systems  Constitutional:  Negative for malaise/fatigue and weight loss.  HENT:  Negative for congestion, hearing loss, sore throat and tinnitus.   Eyes:  Negative for blurred vision and double vision.  Respiratory:  Positive for shortness of breath (chronic, exertional, unchanged). Negative for cough, sputum production and wheezing.   Cardiovascular:  Negative for chest pain, palpitations, orthopnea, claudication and leg swelling.  Gastrointestinal:  Negative for abdominal pain, blood in stool, constipation, diarrhea, heartburn, melena, nausea and vomiting.  Genitourinary: Negative.   Musculoskeletal:  Positive for back pain (chronic, lumbar). Negative for falls, joint pain and myalgias.  Skin:  Negative for rash.  Neurological:  Negative for dizziness, tingling, sensory change, focal weakness, weakness and headaches.  Endo/Heme/Allergies:  Negative for environmental allergies and polydipsia.  Psychiatric/Behavioral: Negative.  Negative for depression, memory loss and substance abuse. The patient is not nervous/anxious.   All other systems reviewed and are negative.    Objective:   There were no vitals filed for this visit.   There is no height or weight on file to calculate BMI.  General appearance: alert, no distress, WD/WN, male HEENT: normocephalic, sclerae anicteric, TMs pearly, nares patent, no discharge or erythema, pharynx normal Oral cavity: MMM, no lesions Neck: supple, no lymphadenopathy, no thyromegaly, no masses Heart: RRR, normal S1, S2, no murmurs Lungs: + diffuse wheezes without rhonchi or  rales Abdomen: +bs, soft, obese, non tender, non distended, no masses, no hepatomegaly, no splenomegaly Musculoskeletal: nontender, no swelling, no obvious deformity. Well healed midline lower back incision. No spinous tenderness. Neg straight leg raise. Tender lumbar musculature bil without spasm.  Extremities: no edema, no cyanosis, no clubbing Pulses: 2+ symmetric, upper and lower extremities, normal cap refill Neurological: alert, oriented x 3, CN2-12 intact, strength normal upper extremities and lower extremities, sensation normal throughout, DTRs 2+ throughout, no cerebellar signs, gait normal Psychiatric: normal affect, behavior normal, pleasant  Skin: warm, dry; scattered actinic keratoses; extensive nevi to back; derm follows  Medicare Attestation I have personally reviewed: The patient's medical and social history Their  use of alcohol, tobacco or illicit drugs Their current medications and supplements The patient's functional ability including ADLs,fall risks, home safety risks, cognitive, and hearing and visual impairment Diet and physical activities Evidence for depression or mood disorders  The patient's weight, height, BMI, and visual acuity have been recorded in the chart.  I have made referrals, counseling, and provided education to the patient based on review of the above and I have provided the patient with a written personalized care plan for preventive services.     Alycia Rossetti, NP   08/07/2022

## 2022-08-08 ENCOUNTER — Encounter: Payer: Self-pay | Admitting: Nurse Practitioner

## 2022-08-08 ENCOUNTER — Ambulatory Visit (INDEPENDENT_AMBULATORY_CARE_PROVIDER_SITE_OTHER): Payer: PPO | Admitting: Nurse Practitioner

## 2022-08-08 VITALS — BP 104/60 | HR 91 | Temp 97.5°F | Ht 76.0 in | Wt 271.8 lb

## 2022-08-08 DIAGNOSIS — R7309 Other abnormal glucose: Secondary | ICD-10-CM | POA: Diagnosis not present

## 2022-08-08 DIAGNOSIS — M545 Low back pain, unspecified: Secondary | ICD-10-CM

## 2022-08-08 DIAGNOSIS — Z79899 Other long term (current) drug therapy: Secondary | ICD-10-CM

## 2022-08-08 DIAGNOSIS — M0579 Rheumatoid arthritis with rheumatoid factor of multiple sites without organ or systems involvement: Secondary | ICD-10-CM

## 2022-08-08 DIAGNOSIS — J449 Chronic obstructive pulmonary disease, unspecified: Secondary | ICD-10-CM | POA: Diagnosis not present

## 2022-08-08 DIAGNOSIS — J4520 Mild intermittent asthma, uncomplicated: Secondary | ICD-10-CM

## 2022-08-08 DIAGNOSIS — I442 Atrioventricular block, complete: Secondary | ICD-10-CM | POA: Diagnosis not present

## 2022-08-08 DIAGNOSIS — E559 Vitamin D deficiency, unspecified: Secondary | ICD-10-CM | POA: Diagnosis not present

## 2022-08-08 DIAGNOSIS — I4891 Unspecified atrial fibrillation: Secondary | ICD-10-CM

## 2022-08-08 DIAGNOSIS — I10 Essential (primary) hypertension: Secondary | ICD-10-CM

## 2022-08-08 DIAGNOSIS — B351 Tinea unguium: Secondary | ICD-10-CM

## 2022-08-08 DIAGNOSIS — N1831 Chronic kidney disease, stage 3a: Secondary | ICD-10-CM

## 2022-08-08 DIAGNOSIS — I700 Atherosclerosis of aorta: Secondary | ICD-10-CM

## 2022-08-08 DIAGNOSIS — Z950 Presence of cardiac pacemaker: Secondary | ICD-10-CM

## 2022-08-08 DIAGNOSIS — E782 Mixed hyperlipidemia: Secondary | ICD-10-CM | POA: Diagnosis not present

## 2022-08-08 DIAGNOSIS — E538 Deficiency of other specified B group vitamins: Secondary | ICD-10-CM

## 2022-08-08 DIAGNOSIS — Z0000 Encounter for general adult medical examination without abnormal findings: Secondary | ICD-10-CM

## 2022-08-08 MED ORDER — TERBINAFINE HCL 250 MG PO TABS: ORAL_TABLET | ORAL | 1 refills | Status: DC

## 2022-08-08 NOTE — Patient Instructions (Addendum)
   Miralax twice a week for constipation  Constipation, Adult Constipation is when a person has fewer than three bowel movements in a week, has difficulty having a bowel movement, or has stools (feces) that are dry, hard, or larger than normal. Constipation may be caused by an underlying condition. It may become worse with age if a person takes certain medicines and does not take in enough fluids. Follow these instructions at home: Eating and drinking  Eat foods that have a lot of fiber, such as beans, whole grains, and fresh fruits and vegetables. Limit foods that are low in fiber and high in fat and processed sugars, such as fried or sweet foods. These include french fries, hamburgers, cookies, candies, and soda. Drink enough fluid to keep your urine pale yellow. General instructions Exercise regularly or as told by your health care provider. Try to do 150 minutes of moderate exercise each week. Use the bathroom when you have the urge to go. Do not hold it in. Take over-the-counter and prescription medicines only as told by your health care provider. This includes any fiber supplements. During bowel movements: Practice deep breathing while relaxing the lower abdomen. Practice pelvic floor relaxation. Watch your condition for any changes. Let your health care provider know about them. Keep all follow-up visits as told by your health care provider. This is important. Contact a health care provider if: You have pain that gets worse. You have a fever. You do not have a bowel movement after 4 days. You vomit. You are not hungry or you lose weight. You are bleeding from the opening between the buttocks (anus). You have thin, pencil-like stools. Get help right away if: You have a fever and your symptoms suddenly get worse. You leak stool or have blood in your stool. Your abdomen is bloated. You have severe pain in your abdomen. You feel dizzy or you faint. Summary Constipation is when a  person has fewer than three bowel movements in a week, has difficulty having a bowel movement, or has stools (feces) that are dry, hard, or larger than normal. Eat foods that have a lot of fiber, such as beans, whole grains, and fresh fruits and vegetables. Drink enough fluid to keep your urine pale yellow. Take over-the-counter and prescription medicines only as told by your health care provider. This includes any fiber supplements. This information is not intended to replace advice given to you by your health care provider. Make sure you discuss any questions you have with your health care provider. Document Revised: 03/15/2022 Document Reviewed: 03/15/2022 Elsevier Patient Education  [place].

## 2022-08-09 DIAGNOSIS — H40033 Anatomical narrow angle, bilateral: Secondary | ICD-10-CM | POA: Diagnosis not present

## 2022-08-09 LAB — LIPID PANEL
Cholesterol: 128 mg/dL (ref <200)
HDL: 39 mg/dL — ABNORMAL LOW (ref >=40)
LDL Cholesterol (Calc): 68 mg/dL (calc)
Non-HDL Cholesterol (Calc): 89 mg/dL (calc) (ref <130)
Total CHOL/HDL Ratio: 3.3 (calc) (ref <5.0)
Triglycerides: 133 mg/dL (ref <150)

## 2022-08-09 LAB — COMPLETE METABOLIC PANEL WITH GFR
AG Ratio: 1.4 (calc) (ref 1.0–2.5)
ALT: 18 U/L (ref 9–46)
AST: 16 U/L (ref 10–35)
Albumin: 3.9 g/dL (ref 3.6–5.1)
Alkaline phosphatase (APISO): 74 U/L (ref 35–144)
BUN: 17 mg/dL (ref 7–25)
CO2: 31 mmol/L (ref 20–32)
Calcium: 8.5 mg/dL — ABNORMAL LOW (ref 8.6–10.3)
Chloride: 102 mmol/L (ref 98–110)
Creat: 1.19 mg/dL (ref 0.70–1.22)
Globulin: 2.7 g/dL (calc) (ref 1.9–3.7)
Glucose, Bld: 123 mg/dL — ABNORMAL HIGH (ref 65–99)
Potassium: 3.8 mmol/L (ref 3.5–5.3)
Sodium: 143 mmol/L (ref 135–146)
Total Bilirubin: 0.6 mg/dL (ref 0.2–1.2)
Total Protein: 6.6 g/dL (ref 6.1–8.1)
eGFR: 58 mL/min/{1.73_m2} — ABNORMAL LOW (ref >=60)

## 2022-08-09 LAB — CBC WITH DIFFERENTIAL/PLATELET
Absolute Monocytes: 566 cells/uL (ref 200–950)
Basophils Absolute: 19 cells/uL (ref 0–200)
Basophils Relative: 0.2 %
Eosinophils Absolute: 10 cells/uL — ABNORMAL LOW (ref 15–500)
Eosinophils Relative: 0.1 %
HCT: 39 % (ref 38.5–50.0)
Hemoglobin: 13.3 g/dL (ref 13.2–17.1)
Lymphs Abs: 1632 cells/uL (ref 850–3900)
MCH: 31.4 pg (ref 27.0–33.0)
MCHC: 34.1 g/dL (ref 32.0–36.0)
MCV: 92 fL (ref 80.0–100.0)
MPV: 10.3 fL (ref 7.5–12.5)
Monocytes Relative: 5.9 %
Neutro Abs: 7373 cells/uL (ref 1500–7800)
Neutrophils Relative %: 76.8 %
Platelets: 235 10*3/uL (ref 140–400)
RBC: 4.24 10*6/uL (ref 4.20–5.80)
RDW: 12.7 % (ref 11.0–15.0)
Total Lymphocyte: 17 %
WBC: 9.6 10*3/uL (ref 3.8–10.8)

## 2022-08-09 LAB — MAGNESIUM: Magnesium: 2.1 mg/dL (ref 1.5–2.5)

## 2022-08-14 NOTE — Progress Notes (Signed)
Remote pacemaker transmission.   

## 2022-09-06 ENCOUNTER — Other Ambulatory Visit (HOSPITAL_COMMUNITY): Payer: Self-pay | Admitting: *Deleted

## 2022-09-06 ENCOUNTER — Other Ambulatory Visit: Payer: Self-pay

## 2022-09-06 DIAGNOSIS — N138 Other obstructive and reflux uropathy: Secondary | ICD-10-CM

## 2022-09-06 DIAGNOSIS — I10 Essential (primary) hypertension: Secondary | ICD-10-CM

## 2022-09-06 MED ORDER — OLMESARTAN MEDOXOMIL 20 MG PO TABS: ORAL_TABLET | ORAL | 3 refills | Status: DC

## 2022-09-06 MED ORDER — APIXABAN 5 MG PO TABS: 5.0000 mg | ORAL_TABLET | Freq: Two times a day (BID) | ORAL | 1 refills | Status: DC

## 2022-09-06 MED ORDER — TERAZOSIN HCL 10 MG PO CAPS: ORAL_CAPSULE | ORAL | 3 refills | Status: AC

## 2022-10-02 ENCOUNTER — Ambulatory Visit: Payer: PPO

## 2022-10-09 ENCOUNTER — Other Ambulatory Visit: Payer: Self-pay | Admitting: Internal Medicine

## 2022-10-09 DIAGNOSIS — R609 Edema, unspecified: Secondary | ICD-10-CM

## 2022-10-09 DIAGNOSIS — I10 Essential (primary) hypertension: Secondary | ICD-10-CM

## 2022-10-10 DIAGNOSIS — Z85828 Personal history of other malignant neoplasm of skin: Secondary | ICD-10-CM | POA: Diagnosis not present

## 2022-10-10 DIAGNOSIS — D1801 Hemangioma of skin and subcutaneous tissue: Secondary | ICD-10-CM | POA: Diagnosis not present

## 2022-10-10 DIAGNOSIS — L820 Inflamed seborrheic keratosis: Secondary | ICD-10-CM | POA: Diagnosis not present

## 2022-10-10 DIAGNOSIS — L570 Actinic keratosis: Secondary | ICD-10-CM | POA: Diagnosis not present

## 2022-10-10 DIAGNOSIS — C44319 Basal cell carcinoma of skin of other parts of face: Secondary | ICD-10-CM | POA: Diagnosis not present

## 2022-10-10 DIAGNOSIS — L821 Other seborrheic keratosis: Secondary | ICD-10-CM | POA: Diagnosis not present

## 2022-10-10 DIAGNOSIS — D692 Other nonthrombocytopenic purpura: Secondary | ICD-10-CM | POA: Diagnosis not present

## 2022-10-24 ENCOUNTER — Telehealth: Payer: Self-pay | Admitting: *Deleted

## 2022-10-24 NOTE — Telephone Encounter (Signed)
Focused Nurse Outreach  Carvalho,Thermon  89 years, Male  DOB: 1931-02-18  M: [phone]  __________________________________________________  Call Outreach  Attempt #1 Date:: 10/24/2022 Time: PM Outcome:: Successful  Patient Visit  Details  Date visit completed: 10/24/2022  Type of visit:: Phone  Visit Details::  1. Afib: Continues on Eliquis. Denies any palpitations, sob, chest pain or dizziness. He does see his Cardiologist as scheduled. Bruises more easily, but nothing major that concerns him. No recent medication changes. 2. HTN: Patient does not monitor his BPs at home. Reports being at goal at office visits. Continues Benicar daily and is taking as prescribed. 3. COPD: Continues on low dose Prednisone which is working well. Failed inhalers in the past. Denies sob at rest or with exertion. Currently out at the park with his grandchildren. 4. Annual Wellness visit completed this year on 08/08/22  CRN Telemedicine Visit: 12 min

## 2022-10-24 NOTE — Telephone Encounter (Signed)
CARE PLAN  Printed on: 10/24/2022 [name],[name]  __________________________________________________  Ongoing Problems Problem: CP: 1. Actions for Patient  Goal 1: Review and act on the below actions as discussed with your clinical pharmacist Priority: Medium  Achieve by: 10/24/2022  Long-term   Care Team Interventions:   1. Action 1: Continue to take all medications as prescribed Done on: 10/24/2022  2. Action 2:  Contact PCP or CCM team if no BM in 72 hours Done on: 10/24/2022  3. Action 3: Aim to consume at least 8 cups of water daily Done on: 10/24/2022  4. Next visit with pharmacist: (6 months) Done on: 10/24/2022 Problem: CP: 2. Chronic Care Management (CCM) Team  Goal 1: Contact your clinical pharmacist and care team with any questions or concerns Priority: Medium  Achieve by: 10/24/2022  Long-term   Care Team Interventions:   1. Team Phone #: [phone] Done on: 10/24/2022  2. Clinical Pharmacist: Yolande Jolly Done on: 10/24/2022  3. Coordinating Registered Nurse: Candiss Norse Done on: 10/24/2022  4. Health Concierge: Maryellen Pile Done on: 10/24/2022 Problem: CP: Atrial Fibrillation  Goal 1: Maintain controlled heart rhythm and rate and prevent stroke / clots Priority: Medium  Achieve by: 10/24/2022  Long-term   Care Team Interventions:   1. Educated Patient on signs and symptoms of bleeding and to contact pharmacist / nurse / PCP with any questions or concerns. This includes a nosebleed that won't stop within 10 minutes, cuts / scrapes that won't stop bleeding, red or cola colored urine, red or dark-colored stool. Done on: 10/24/2022  2. Continue taking Eliquis as prescribed to prevent reduce risk of stroke. Done on: 10/24/2022 Problem: CP: Hypertension (HTN)  Goal 1: Maintain Blood Pressure less than 130/80.    Your current BP is 104/60 (08/08/22) Priority: Medium  Achieve by: 10/24/2022  Long-term   Care Team Interventions:   1.  Recommend following the DASH diet, which emphasizes fruits and vegetables and low-fat dairy products along with whole grains, fish, poultry, and nuts. Reduce red meats and sugars. Done on: 10/24/2022  2. Educated patient to call PCP office if you develop episodes of high or low blood pressure, dizziness, falls or increased headaches, and/or swelling of legs and feet. Done on: 10/24/2022  3. Continue taking Olmesartan as prescribed Done on: 10/24/2022 Problem: CP: Chronic Obstructive Pulmonary Disease (COPD)  Goal 1: Prevent worsening of shortness of breath and hospitalizations daily over the next (90) days Priority: Medium  Achieve by: 10/24/2022   Care Team Interventions:   1. Recommend calling pharmacist/coordinating registered nurse/primary care provider if your COPD symptoms worsen, such as worsened cough, shortness of breath, and/or increased mucous production. Done on: 10/24/2022  2. Continue low dose Prednisone as prescribed (Failed inhalers in the past) Done on: 10/24/2022 Problem: CP: Preventative health care screenings  Goal 1: Patient will have all recommended preventative health care screening(s) Priority: Medium  Achieve by: 10/24/2022  Long-term   Care Team Interventions:   1. Annual Wellness Visit scheduled for (already completed on 08/08/22) Done on: 10/24/2022  2. Recommend Shingrix vaccine Done on: 10/24/2022  [facility] Care plan revision: 14 min

## 2022-10-26 ENCOUNTER — Ambulatory Visit: Payer: PPO | Admitting: Nurse Practitioner

## 2022-11-08 ENCOUNTER — Ambulatory Visit: Payer: PPO | Admitting: Nurse Practitioner

## 2022-12-04 ENCOUNTER — Ambulatory Visit: Payer: PPO | Admitting: Nurse Practitioner

## 2022-12-12 ENCOUNTER — Ambulatory Visit: Payer: PPO | Admitting: Nurse Practitioner

## 2022-12-19 DIAGNOSIS — Z6832 Body mass index (BMI) 32.0-32.9, adult: Secondary | ICD-10-CM | POA: Diagnosis not present

## 2022-12-19 DIAGNOSIS — I509 Heart failure, unspecified: Secondary | ICD-10-CM | POA: Diagnosis not present

## 2022-12-19 DIAGNOSIS — D692 Other nonthrombocytopenic purpura: Secondary | ICD-10-CM | POA: Diagnosis not present

## 2022-12-26 ENCOUNTER — Other Ambulatory Visit: Payer: Self-pay | Admitting: Internal Medicine

## 2022-12-26 DIAGNOSIS — I10 Essential (primary) hypertension: Secondary | ICD-10-CM

## 2022-12-26 DIAGNOSIS — N138 Other obstructive and reflux uropathy: Secondary | ICD-10-CM

## 2022-12-26 MED ORDER — FINASTERIDE 5 MG PO TABS: ORAL_TABLET | ORAL | 3 refills | Status: AC

## 2022-12-26 MED ORDER — OLMESARTAN MEDOXOMIL 20 MG PO TABS: ORAL_TABLET | ORAL | 3 refills | Status: DC

## 2023-01-01 ENCOUNTER — Ambulatory Visit (INDEPENDENT_AMBULATORY_CARE_PROVIDER_SITE_OTHER): Payer: PPO

## 2023-01-01 DIAGNOSIS — I442 Atrioventricular block, complete: Secondary | ICD-10-CM | POA: Diagnosis not present

## 2023-01-01 LAB — CUP PACEART REMOTE DEVICE CHECK
Battery Remaining Longevity: 120 mo
Battery Voltage: 3.01 V
Brady Statistic AP VP Percent: 32.79 %
Brady Statistic AP VS Percent: 0.11 %
Brady Statistic AS VP Percent: 65.34 %
Brady Statistic AS VS Percent: 1.77 %
Brady Statistic RA Percent Paced: 33.07 %
Brady Statistic RV Percent Paced: 98.12 %
Date Time Interrogation Session: 20240818200755
Implantable Lead Connection Status: 753985
Implantable Lead Connection Status: 753985
Implantable Lead Implant Date: 20230219
Implantable Lead Implant Date: 20230219
Implantable Lead Location: 753859
Implantable Lead Location: 753860
Implantable Lead Model: 3830
Implantable Lead Model: 5076
Implantable Pulse Generator Implant Date: 20230219
Lead Channel Impedance Value: 304 Ohm
Lead Channel Impedance Value: 380 Ohm
Lead Channel Impedance Value: 475 Ohm
Lead Channel Impedance Value: 494 Ohm
Lead Channel Pacing Threshold Amplitude: 0.75 V
Lead Channel Pacing Threshold Amplitude: 1.25 V
Lead Channel Pacing Threshold Pulse Width: 0.4 ms
Lead Channel Pacing Threshold Pulse Width: 0.4 ms
Lead Channel Sensing Intrinsic Amplitude: 11.125 mV
Lead Channel Sensing Intrinsic Amplitude: 11.125 mV
Lead Channel Sensing Intrinsic Amplitude: 3.375 mV
Lead Channel Sensing Intrinsic Amplitude: 3.375 mV
Lead Channel Setting Pacing Amplitude: 1.5 V
Lead Channel Setting Pacing Amplitude: 2.5 V
Lead Channel Setting Pacing Pulse Width: 0.4 ms
Lead Channel Setting Sensing Sensitivity: 1.2 mV
Zone Setting Status: 755011

## 2023-01-11 NOTE — Progress Notes (Signed)
Remote pacemaker transmission.   

## 2023-02-01 ENCOUNTER — Encounter: Payer: Self-pay | Admitting: Nurse Practitioner

## 2023-02-01 ENCOUNTER — Ambulatory Visit (INDEPENDENT_AMBULATORY_CARE_PROVIDER_SITE_OTHER): Payer: PPO | Admitting: Nurse Practitioner

## 2023-02-01 VITALS — BP 100/50 | HR 84 | Temp 97.5°F | Ht 76.0 in | Wt 273.2 lb

## 2023-02-01 DIAGNOSIS — I4891 Unspecified atrial fibrillation: Secondary | ICD-10-CM | POA: Diagnosis not present

## 2023-02-01 DIAGNOSIS — N1831 Chronic kidney disease, stage 3a: Secondary | ICD-10-CM

## 2023-02-01 DIAGNOSIS — R6889 Other general symptoms and signs: Secondary | ICD-10-CM | POA: Diagnosis not present

## 2023-02-01 DIAGNOSIS — I959 Hypotension, unspecified: Secondary | ICD-10-CM | POA: Diagnosis not present

## 2023-02-01 DIAGNOSIS — R5383 Other fatigue: Secondary | ICD-10-CM | POA: Diagnosis not present

## 2023-02-01 DIAGNOSIS — N401 Enlarged prostate with lower urinary tract symptoms: Secondary | ICD-10-CM | POA: Diagnosis not present

## 2023-02-01 DIAGNOSIS — Z1152 Encounter for screening for COVID-19: Secondary | ICD-10-CM | POA: Diagnosis not present

## 2023-02-01 DIAGNOSIS — Z79899 Other long term (current) drug therapy: Secondary | ICD-10-CM | POA: Diagnosis not present

## 2023-02-01 DIAGNOSIS — Z7901 Long term (current) use of anticoagulants: Secondary | ICD-10-CM | POA: Diagnosis not present

## 2023-02-01 DIAGNOSIS — R531 Weakness: Secondary | ICD-10-CM | POA: Diagnosis not present

## 2023-02-01 DIAGNOSIS — N138 Other obstructive and reflux uropathy: Secondary | ICD-10-CM

## 2023-02-01 DIAGNOSIS — R350 Frequency of micturition: Secondary | ICD-10-CM

## 2023-02-01 DIAGNOSIS — D649 Anemia, unspecified: Secondary | ICD-10-CM | POA: Diagnosis not present

## 2023-02-01 LAB — POC COVID19 BINAXNOW: SARS Coronavirus 2 Ag: NEGATIVE

## 2023-02-01 LAB — POCT INFLUENZA A/B
Influenza A, POC: NEGATIVE
Influenza B, POC: NEGATIVE

## 2023-02-01 NOTE — Patient Instructions (Signed)
Fatigue If you have fatigue, you feel tired all the time and have a lack of energy or a lack of motivation. Fatigue may make it difficult to start or complete tasks because of exhaustion. Occasional or mild fatigue is often a normal response to activity or life. However, long-term (chronic) or extreme fatigue may be a symptom of a medical condition such as: Depression. Not having enough red blood cells or hemoglobin in the blood (anemia). A problem with a small gland located in the lower front part of the neck (thyroid disorder). Rheumatologic conditions. These are problems related to the body's defense system (immune system). Infections, especially certain viral infections. Fatigue can also lead to negative health outcomes over time. Follow these instructions at home: Medicines Take over-the-counter and prescription medicines only as told by your health care provider. Take a multivitamin if told by your health care provider. Do not use herbal or dietary supplements unless they are approved by your health care provider. Eating and drinking  Avoid heavy meals in the evening. Eat a well-balanced diet, which includes lean proteins, whole grains, plenty of fruits and vegetables, and low-fat dairy products. Avoid eating or drinking too many products with caffeine in them. Avoid alcohol. Drink enough fluid to keep your urine pale yellow. Activity  Exercise regularly, as told by your health care provider. Use or practice techniques to help you relax, such as yoga, tai chi, meditation, or massage therapy. Lifestyle Change situations that cause you stress. Try to keep your work and personal schedules in balance. Do not use recreational or illegal drugs. General instructions Monitor your fatigue for any changes. Go to bed and get up at the same time every day. Avoid fatigue by pacing yourself during the day and getting enough sleep at night. Maintain a healthy weight. Contact a health care  provider if: Your fatigue does not get better. You have a fever. You suddenly lose or gain weight. You have headaches. You have trouble falling asleep or sleeping through the night. You feel angry, guilty, anxious, or sad. You have swelling in your legs or another part of your body. Get help right away if: You feel confused, feel like you might faint, or faint. Your vision is blurry or you have a severe headache. You have severe pain in your abdomen, your back, or the area between your waist and hips (pelvis). You have chest pain, shortness of breath, or an irregular or fast heartbeat. You are unable to urinate, or you urinate less than normal. You have abnormal bleeding from the rectum, nose, lungs, nipples, or, if you are male, the vagina. You vomit blood. You have thoughts about hurting yourself or others. These symptoms may be an emergency. Get help right away. Call 911. Do not wait to see if the symptoms will go away. Do not drive yourself to the hospital. Get help right away if you feel like you may hurt yourself or others, or have thoughts about taking your own life. Go to your nearest emergency room or: Call 911. Call the National Suicide Prevention Lifeline at 1-800-273-8255 or 988. This is open 24 hours a day. Text the Crisis Text Line at 741741. Summary If you have fatigue, you feel tired all the time and have a lack of energy or a lack of motivation. Fatigue may make it difficult to start or complete tasks because of exhaustion. Long-term (chronic) or extreme fatigue may be a symptom of a medical condition. Exercise regularly, as told by your health care provider.   Change situations that cause you stress. Try to keep your work and personal schedules in balance. This information is not intended to replace advice given to you by your health care provider. Make sure you discuss any questions you have with your health care provider. Document Revised: 02/21/2021 Document  Reviewed: 02/21/2021 Elsevier Patient Education  2024 Elsevier Inc.  

## 2023-02-01 NOTE — Progress Notes (Signed)
Assessment and Plan:  [name] was seen today for an episodic visit.  Diagnoses and all order for this visit:  Hypotension, unspecified hypotension type Instructed to continue to check BP and hold if <120/80. Change positions slowly if feeling dizzy to prevent increase in falls. Monitor electrolytes and Hgb for any underlying blood loss  - CBC with Differential/Platelet - COMPLETE METABOLIC PANEL WITH GFR  Other fatigue Covid and Flu Negative  Stay well hydrated Monitor electrolytes and Hgb for any underlying etiology.  - CBC with Differential/Platelet - COMPLETE METABOLIC PANEL WITH GFR - Covid Negative -  Flu Negative  Weakness Monitor electrolytes and Hgb for any underlying etiology.  - CBC with Differential/Platelet - COMPLETE METABOLIC PANEL WITH GFR  Long term (current) use of anticoagulants Will check INR to confirm medication compliance considering fatigue weakness and possible blood loss (to be confirmed by CBC/Hgb).  Atrial fibrillation, unspecified type [facility]) Pacemaker Cardiology following Continue Eliquis  Stage 3a chronic kidney disease (HCC) Discussed how what you eat and drink can aide in kidney protection. Stay well hydrated. Avoid high salt foods. Avoid NSAIDS. Keep BP and BG well controlled.   Take medications as prescribed. Remain active and exercise as tolerated daily. Maintain weight.   - COMPLETE METABOLIC PANEL WITH GFR  BPH with obstruction/lower urinary tract symptoms/urinary frequency Considering age and reports of fatigue and weakness will obtain UA with culture for any underlying urinary infection.  - Urinalysis, Routine w reflex microscopic - Urine Culture  Medication management All medications discussed and reviewed in full. All questions and concerns regarding medications addressed.    - CBC with Differential/Platelet - COMPLETE METABOLIC PANEL WITH GFR - Urinalysis, Routine w reflex microscopic - Urine  Culture  Notify office for further evaluation and treatment, questions or concerns if s/s fail to improve. The risks and benefits of my recommendations, as well as other treatment options were discussed with the patient today. Questions were answered.  Further disposition pending results of labs. Discussed med's effects and SE's.    Over 30 minutes of exam, counseling, chart review, and critical decision making was performed.   Future Appointments  Date Time Provider Department Center  04/02/2023  7:05 AM [facility]  05/11/2023 10:00 AM Lucky Cowboy, MD [facility] None  07/02/2023  7:05 AM [facility]  10/01/2023  7:05 AM [facility]    ------------------------------------------------------------------------------------------------------------------   HPI BP (!) 100/50   Pulse 84   Temp (!) 97.5 F (36.4 C)   Ht 6\' 4"  (1.93 m)   Wt 273 lb 3.2 oz (123.9 kg)   SpO2 94%   BMI 33.25 kg/m   89 y.o.male presents alongside daughter with change in daily routine accompanied by fatigue and weakness.  Daughter who lives beside him states that she has a tracker on him and follows him throughout the day.  She noticed this  morning he did not go to [employer] at his usual time of 0730.  She went to check on him at 0800 and he ws not OOB.  She got him up and he then when back to bed with his clothes on.  States she saw him the evening before and he was acting normal, eating and drinking foot - did not appear fatigued or tired.  Patient shares today that he did wake up "feeing off, weak and tired."  He did take in some food this morning.  Denies nausea, vomiting, blood in stool or urine.  He is on Eliquis for atrial fib, has pacemaker.  Follows with Cardiology but ha not had f/u since 07/2022.  Has hx of BPH with lower urinary tract symptoms, some urinary frequency but no other  symptoms.  Denies fever, chills.  He did take his BP medications as scheduled this AM.  Daughter checked BP in home and reported 90/60.  He was also hypotensive in clinic today.  BP Readings from Last 3 Encounters:  02/01/23 (!) 100/50  08/08/22 104/60  05/09/22 126/62   He has a hx of CKD Stage 3.  Last GFR: Lab Results  Component Value Date   EGFR 58 (L) 08/08/2022     Past Medical History:  Diagnosis Date   Allergy    Asthma    BPH (benign prostatic hypertrophy)    BPH (benign prostatic hypertrophy)    COPD (chronic obstructive pulmonary disease) (HCC)    DJD (degenerative joint disease)    GERD (gastroesophageal reflux disease)    History of urinary retention    Hypertension    IBS (irritable bowel syndrome)    RBBB    Rheumatoid arthritis(714.0)    DR. HAWKES   Scrotal lesion    Short of breath on exertion      Allergies  Allergen Reactions   Ace Inhibitors    Asa [Aspirin]     High Dose asprin   Penicillins Hives   Vasotec [Enalapril]     Cough   Clindamycin/Lincomycin     Hives     Current Outpatient Medications on File Prior to Visit  Medication Sig   apixaban (ELIQUIS) 5 MG TABS tablet Take 1 tablet (5 mg total) by mouth 2 (two) times daily.   Cholecalciferol (VITAMIN D) 125 MCG (5000 UT) CAPS Take 5,000 Units by mouth as directed.   finasteride (PROSCAR) 5 MG tablet Take  1 tablet  Daily  for Prostate   furosemide (LASIX) 40 MG tablet TAKE 1 TABLET DAILY FOR FLUID RETENTION / EDEMA   olmesartan (BENICAR) 20 MG tablet Take 1 tablet  Daily  for BP   OVER THE COUNTER MEDICATION 1 capsule daily. Naturebell Zinc Quercitin With Vitamin C and D3 (130 mg )   predniSONE (DELTASONE) 5 MG tablet Take 1 tablet by mouth twice a day or as directed for asthma and rheumatoid arthritis   rosuvastatin (CRESTOR) 20 MG tablet Take 1 tablet by mouth daily for cholesterol   terazosin (HYTRIN) 10 MG capsule Take  1 capsule  at Bedtime  for Prostate   terbinafine (LAMISIL)  250 MG tablet Take 1 tablet Daily for ToeNail Fungus & Athlete's Foot for 30 days straight then stop for 30 days and repeat cycle   vitamin B-12 (CYANOCOBALAMIN) 1000 MCG tablet Take 1,000 mcg by mouth 3 (three) times a week.   magnesium gluconate (MAGONATE) 500 MG tablet Take 500 mg by mouth daily.   No current facility-administered medications on file prior to visit.    ROS: all negative except what is noted in the HPI.   Physical Exam:  BP (!) 100/50   Pulse 84   Temp (!) 97.5 F (36.4 C)   Ht 6\' 4"  (1.93 m)   Wt 273 lb 3.2 oz (123.9 kg)   SpO2 94%   BMI 33.25 kg/m   General Appearance: NAD.  Awake, conversant and cooperative. Eyes: PERRLA, EOMs intact.  Sclera white.  Conjunctiva without erythema. Sinuses: No frontal/maxillary tenderness.  No nasal discharge. Nares patent.  ENT/Mouth: Ext aud canals clear.  Bilateral  TMs w/DOL and without erythema or bulging. Hearing intact.  Posterior pharynx without swelling or exudate.  Tonsils without swelling or erythema.  Neck: Supple.  No masses, nodules or thyromegaly. Respiratory: Effort is regular with non-labored breathing. Breath sounds are equal bilaterally without rales, rhonchi, wheezing or stridor.  Cardio: RRR with no MRGs. Brisk peripheral pulses without edema.  Abdomen: Active BS in all four quadrants.  Soft and non-tender without guarding, rebound tenderness, hernias or masses. Lymphatics: Non tender without lymphadenopathy.  Musculoskeletal: Full ROM, 5/5 strength, normal ambulation.  No clubbing or cyanosis. Skin: Appropriate color for ethnicity. Warm without rashes, lesions, ecchymosis, ulcers.  Neuro: CN II-XII grossly normal. Normal muscle tone without cerebellar symptoms and intact sensation.   Psych: AO X 3,  appropriate mood and affect, insight and judgment.     Adela Glimpse, NP 12:05 PM [facility]

## 2023-02-01 NOTE — Addendum Note (Signed)
Addended by: Adria Dill on: 02/01/2023 02:25 PM   Modules accepted: Orders

## 2023-02-02 ENCOUNTER — Encounter: Payer: Self-pay | Admitting: Nurse Practitioner

## 2023-02-02 ENCOUNTER — Other Ambulatory Visit: Payer: Self-pay | Admitting: Nurse Practitioner

## 2023-02-02 DIAGNOSIS — D649 Anemia, unspecified: Secondary | ICD-10-CM

## 2023-02-02 MED ORDER — CIPROFLOXACIN HCL 250 MG PO TABS
ORAL_TABLET | ORAL | 0 refills | Status: DC
Start: 2023-02-02 — End: 2023-03-20

## 2023-02-03 LAB — CBC WITH DIFFERENTIAL/PLATELET
Absolute Monocytes: 769 cells/uL (ref 200–950)
Basophils Absolute: 25 cells/uL (ref 0–200)
Basophils Relative: 0.2 %
Eosinophils Absolute: 13 cells/uL — ABNORMAL LOW (ref 15–500)
Eosinophils Relative: 0.1 %
HCT: 39.4 % (ref 38.5–50.0)
Hemoglobin: 12.7 g/dL — ABNORMAL LOW (ref 13.2–17.1)
Lymphs Abs: 1058 cells/uL (ref 850–3900)
MCH: 30.7 pg (ref 27.0–33.0)
MCHC: 32.2 g/dL (ref 32.0–36.0)
MCV: 95.2 fL (ref 80.0–100.0)
MPV: 10.1 fL (ref 7.5–12.5)
Monocytes Relative: 6.1 %
Neutro Abs: 10735 cells/uL — ABNORMAL HIGH (ref 1500–7800)
Neutrophils Relative %: 85.2 %
Platelets: 211 10*3/uL (ref 140–400)
RBC: 4.14 10*6/uL — ABNORMAL LOW (ref 4.20–5.80)
RDW: 12.7 % (ref 11.0–15.0)
Total Lymphocyte: 8.4 %
WBC: 12.6 10*3/uL — ABNORMAL HIGH (ref 3.8–10.8)

## 2023-02-03 LAB — COMPLETE METABOLIC PANEL WITH GFR
AG Ratio: 1.5 (calc) (ref 1.0–2.5)
ALT: 14 U/L (ref 9–46)
AST: 16 U/L (ref 10–35)
Albumin: 3.7 g/dL (ref 3.6–5.1)
Alkaline phosphatase (APISO): 69 U/L (ref 35–144)
BUN/Creatinine Ratio: 12 (calc) (ref 6–22)
BUN: 20 mg/dL (ref 7–25)
CO2: 29 mmol/L (ref 20–32)
Calcium: 9 mg/dL (ref 8.6–10.3)
Chloride: 103 mmol/L (ref 98–110)
Creat: 1.66 mg/dL — ABNORMAL HIGH (ref 0.70–1.22)
Globulin: 2.5 g/dL (calc) (ref 1.9–3.7)
Glucose, Bld: 108 mg/dL — ABNORMAL HIGH (ref 65–99)
Potassium: 4.3 mmol/L (ref 3.5–5.3)
Sodium: 140 mmol/L (ref 135–146)
Total Bilirubin: 0.7 mg/dL (ref 0.2–1.2)
Total Protein: 6.2 g/dL (ref 6.1–8.1)
eGFR: 39 mL/min/{1.73_m2} — ABNORMAL LOW (ref >=60)

## 2023-02-03 LAB — URINALYSIS, ROUTINE W REFLEX MICROSCOPIC
Bacteria, UA: NONE SEEN /HPF
Bilirubin Urine: NEGATIVE
Glucose, UA: NEGATIVE
Nitrite: NEGATIVE
Specific Gravity, Urine: 1.014 (ref 1.001–1.035)
pH: 6 (ref 5.0–8.0)

## 2023-02-03 LAB — URINE CULTURE
MICRO NUMBER:: 15490086
SPECIMEN QUALITY:: ADEQUATE

## 2023-02-03 LAB — TEST AUTHORIZATION

## 2023-02-03 LAB — IRON,TIBC AND FERRITIN PANEL
%SAT: 25 % (calc) (ref 20–48)
Ferritin: 108 ng/mL (ref 24–380)
Iron: 72 ug/dL (ref 50–180)
TIBC: 291 mcg/dL (calc) (ref 250–425)

## 2023-02-06 ENCOUNTER — Encounter: Payer: Self-pay | Admitting: Cardiology

## 2023-02-12 DIAGNOSIS — N1831 Chronic kidney disease, stage 3a: Secondary | ICD-10-CM | POA: Diagnosis not present

## 2023-02-12 DIAGNOSIS — J449 Chronic obstructive pulmonary disease, unspecified: Secondary | ICD-10-CM | POA: Diagnosis not present

## 2023-02-12 DIAGNOSIS — Z87891 Personal history of nicotine dependence: Secondary | ICD-10-CM | POA: Diagnosis not present

## 2023-02-12 DIAGNOSIS — Z1331 Encounter for screening for depression: Secondary | ICD-10-CM | POA: Diagnosis not present

## 2023-02-12 DIAGNOSIS — D6869 Other thrombophilia: Secondary | ICD-10-CM | POA: Diagnosis not present

## 2023-02-12 DIAGNOSIS — I4891 Unspecified atrial fibrillation: Secondary | ICD-10-CM | POA: Diagnosis not present

## 2023-02-12 DIAGNOSIS — M059 Rheumatoid arthritis with rheumatoid factor, unspecified: Secondary | ICD-10-CM | POA: Diagnosis not present

## 2023-02-12 DIAGNOSIS — I129 Hypertensive chronic kidney disease with stage 1 through stage 4 chronic kidney disease, or unspecified chronic kidney disease: Secondary | ICD-10-CM | POA: Diagnosis not present

## 2023-02-12 DIAGNOSIS — Z85828 Personal history of other malignant neoplasm of skin: Secondary | ICD-10-CM | POA: Diagnosis not present

## 2023-02-12 DIAGNOSIS — L814 Other melanin hyperpigmentation: Secondary | ICD-10-CM | POA: Diagnosis not present

## 2023-02-12 DIAGNOSIS — C44319 Basal cell carcinoma of skin of other parts of face: Secondary | ICD-10-CM | POA: Diagnosis not present

## 2023-02-12 DIAGNOSIS — L821 Other seborrheic keratosis: Secondary | ICD-10-CM | POA: Diagnosis not present

## 2023-02-12 DIAGNOSIS — L570 Actinic keratosis: Secondary | ICD-10-CM | POA: Diagnosis not present

## 2023-02-12 DIAGNOSIS — C44529 Squamous cell carcinoma of skin of other part of trunk: Secondary | ICD-10-CM | POA: Diagnosis not present

## 2023-02-12 DIAGNOSIS — C44519 Basal cell carcinoma of skin of other part of trunk: Secondary | ICD-10-CM | POA: Diagnosis not present

## 2023-02-12 DIAGNOSIS — D1801 Hemangioma of skin and subcutaneous tissue: Secondary | ICD-10-CM | POA: Diagnosis not present

## 2023-02-22 ENCOUNTER — Other Ambulatory Visit: Payer: Self-pay

## 2023-02-22 ENCOUNTER — Ambulatory Visit: Payer: PPO | Admitting: Nurse Practitioner

## 2023-02-22 ENCOUNTER — Encounter: Payer: Self-pay | Admitting: Nurse Practitioner

## 2023-02-22 VITALS — BP 130/60 | HR 90 | Temp 97.5°F | Ht 76.0 in | Wt 272.0 lb

## 2023-02-22 DIAGNOSIS — Z1152 Encounter for screening for COVID-19: Secondary | ICD-10-CM | POA: Diagnosis not present

## 2023-02-22 DIAGNOSIS — R5383 Other fatigue: Secondary | ICD-10-CM | POA: Diagnosis not present

## 2023-02-22 DIAGNOSIS — J449 Chronic obstructive pulmonary disease, unspecified: Secondary | ICD-10-CM

## 2023-02-22 DIAGNOSIS — R051 Acute cough: Secondary | ICD-10-CM

## 2023-02-22 DIAGNOSIS — R0981 Nasal congestion: Secondary | ICD-10-CM | POA: Diagnosis not present

## 2023-02-22 LAB — POC COVID19 BINAXNOW: SARS Coronavirus 2 Ag: NEGATIVE

## 2023-02-22 MED ORDER — BENZONATATE 200 MG PO CAPS: ORAL_CAPSULE | ORAL | 1 refills | Status: DC

## 2023-02-22 MED ORDER — ALBUTEROL SULFATE HFA 108 (90 BASE) MCG/ACT IN AERS: 2.0000 | INHALATION_SPRAY | Freq: Four times a day (QID) | RESPIRATORY_TRACT | 2 refills | Status: DC | PRN

## 2023-02-22 MED ORDER — AZITHROMYCIN 250 MG PO TABS
ORAL_TABLET | ORAL | 1 refills | Status: DC
Start: 2023-02-22 — End: 2023-03-20

## 2023-02-22 NOTE — Progress Notes (Signed)
Assessment and Plan:  [name] was seen today for an episodic visit.  Diagnoses and all order for this visit:  Encounter for screening for COVID-19 Negative  - POC COVID-19  Acute cough/Nasal congestion Start Albuterol inhaler as directed. Start Benzonatate cough perles as directed. Start Azithromycin as directed. Continue Prednisone on hand as directed. Stay hydrated to keep mucus thin and productive  Report to ER or call 911 for any increase in difficulty breathing.   Other fatigue Rest Discussed good nutritional support for immune boosting with foods rich in vitamin C and zinc.    Chronic obstructive pulmonary disease, unspecified COPD type (HCC) Start Albuterol as directed Continue Prednisone as directed Report to ER or call 911 for any increase in difficulty breathing.   Notify office for further evaluation and treatment, questions or concerns if s/s fail to improve. The risks and benefits of my recommendations, as well as other treatment options were discussed with the patient today. Questions were answered.  Further disposition pending results of labs. Discussed med's effects and SE's.    Over 15 minutes of exam, counseling, chart review, and critical decision making was performed.   Future Appointments  Date Time Provider Department Center  03/20/2023  2:20 PM Sheilah Pigeon, [place] [facility]  04/02/2023  7:05 AM [facility]  05/11/2023 10:00 AM Lucky Cowboy, MD [facility] None  07/02/2023  7:05 AM [facility]  10/01/2023  7:05 AM [facility]    ------------------------------------------------------------------------------------------------------------------   HPI BP 130/60   Pulse 90   Temp (!) 97.5 F (36.4 C)   Ht 6\' 4"  (1.93 m)   Wt 272 lb (123.4 kg)   SpO2 98%   BMI 33.11 kg/m    Patient complains of  symptoms of a URI. Symptoms include congestion, cough described as nonproductive, nasal congestion, and sneezing. Onset of symptoms was 3 days ago, and has been unchanged since that time. Treatment to date: none.  Denies SOB, DOE, wheezing, fever, chills, N/V.    Past Medical History:  Diagnosis Date   Allergy    Asthma    BPH (benign prostatic hypertrophy)    BPH (benign prostatic hypertrophy)    COPD (chronic obstructive pulmonary disease) (HCC)    DJD (degenerative joint disease)    GERD (gastroesophageal reflux disease)    History of urinary retention    Hypertension    IBS (irritable bowel syndrome)    RBBB    Rheumatoid arthritis(714.0)    DR. HAWKES   Scrotal lesion    Short of breath on exertion      Allergies  Allergen Reactions   Ace Inhibitors    Asa [Aspirin]     High Dose asprin   Penicillins Hives   Vasotec [Enalapril]     Cough   Clindamycin/Lincomycin     Hives     Current Outpatient Medications on File Prior to Visit  Medication Sig   apixaban (ELIQUIS) 5 MG TABS tablet Take 1 tablet (5 mg total) by mouth 2 (two) times daily.   Cholecalciferol (VITAMIN D) 125 MCG (5000 UT) CAPS Take 5,000 Units by mouth as directed.   ciprofloxacin (CIPRO) 250 MG tablet Take 1 tablet 2 x /day with Food for Infection   finasteride (PROSCAR) 5 MG tablet Take  1 tablet  Daily  for Prostate   furosemide (LASIX) 40 MG tablet TAKE 1 TABLET DAILY FOR FLUID RETENTION / EDEMA   magnesium gluconate (MAGONATE) 500  MG tablet Take 500 mg by mouth daily.   olmesartan (BENICAR) 20 MG tablet Take 1 tablet  Daily  for BP   OVER THE COUNTER MEDICATION 1 capsule daily. Naturebell Zinc Quercitin With Vitamin C and D3 (130 mg )   predniSONE (DELTASONE) 5 MG tablet Take 1 tablet by mouth twice a day or as directed for asthma and rheumatoid arthritis   rosuvastatin (CRESTOR) 20 MG tablet Take 1 tablet by mouth daily for cholesterol   terazosin (HYTRIN) 10 MG capsule Take  1 capsule  at Bedtime   for Prostate   terbinafine (LAMISIL) 250 MG tablet Take 1 tablet Daily for ToeNail Fungus & Athlete's Foot for 30 days straight then stop for 30 days and repeat cycle   vitamin B-12 (CYANOCOBALAMIN) 1000 MCG tablet Take 1,000 mcg by mouth 3 (three) times a week.   No current facility-administered medications on file prior to visit.    ROS: all negative except what is noted in the HPI.   Physical Exam:  BP 130/60   Pulse 90   Temp (!) 97.5 F (36.4 C)   Ht 6\' 4"  (1.93 m)   Wt 272 lb (123.4 kg)   SpO2 98%   BMI 33.11 kg/m   General Appearance: NAD.  Awake, conversant and cooperative. Eyes: PERRLA, EOMs intact.  Sclera white.  Conjunctiva without erythema. Sinuses: Frontal/maxillary tenderness.  No nasal discharge. Nares patent.  ENT/Mouth: Ext aud canals clear.  Bilateral TMs w/DOL and without erythema or bulging. Hearing intact.  Posterior pharynx without swelling or exudate.  Tonsils without swelling or erythema.  Neck: Supple.  No masses, nodules or thyromegaly. Respiratory: Effort is regular with non-labored breathing. Breath sounds are equal bilaterally without rales, rhonchi, wheezing or stridor.  Cardio: RRR with no MRGs. Brisk peripheral pulses without edema.  Abdomen: Active BS in all four quadrants.  Soft and non-tender without guarding, rebound tenderness, hernias or masses. Lymphatics: Non tender without lymphadenopathy.  Musculoskeletal: Full ROM, 5/5 strength, normal ambulation.  No clubbing or cyanosis. Skin: Appropriate color for ethnicity. Warm without rashes, lesions, ecchymosis, ulcers.  Neuro: CN II-XII grossly normal. Normal muscle tone without cerebellar symptoms and intact sensation.   Psych: AO X 3,  appropriate mood and affect, insight and judgment.     Adela Glimpse, NP 11:42 AM [facility]

## 2023-02-22 NOTE — Patient Instructions (Signed)

## 2023-02-27 DIAGNOSIS — H353111 Nonexudative age-related macular degeneration, right eye, early dry stage: Secondary | ICD-10-CM | POA: Diagnosis not present

## 2023-02-27 DIAGNOSIS — H40033 Anatomical narrow angle, bilateral: Secondary | ICD-10-CM | POA: Diagnosis not present

## 2023-02-27 DIAGNOSIS — H2513 Age-related nuclear cataract, bilateral: Secondary | ICD-10-CM | POA: Diagnosis not present

## 2023-02-27 DIAGNOSIS — H53022 Refractive amblyopia, left eye: Secondary | ICD-10-CM | POA: Diagnosis not present

## 2023-03-18 NOTE — Progress Notes (Unsigned)
Cardiology Office Note:  .   Date:  03/18/2023  ID:  [name], DOB [birth year], MRN [id number] PCP: Lucky Cowboy, MD   [facility] Providers Cardiologist:  None Electrophysiologist:  Lanier Prude, MD {  History of Present Illness: .   [name] is a 89 y.o. male w/PMHx of RBBB, HTN, RA, asthma/COPD, DJD, GERD, symptomatic brady w/PPM, AFib  He saw Dr. Lalla Brothers May 2023, doing well, stable home weights, back pain limiting him, no changes made.  He saw AFib clinic Dec 2023, brief episode, started on [facility], no AAD  Today's visit is scheduled as a annual visit  ROS:   He is accompanied by his daughter (a Char[employer] fundraiser) The pt reports doing well, turns 89 in a few days! His daughter reports that late September she noticed (via [employer]) that he had not gotten up and gone to [employer] like he does usually without fail for breakfast. She went to check on him and he was a bit lethargic, his BP 70's/40' He did not want her to call 911, and was able to eventually bget him to the PMD BP better but still low. As it turned out he was taking his olmesart and AND an old losartan rx as wel. Both were held and eventually his BP normalized and then high again towars 150's or so. His olmesartan resumed His PMD preferred that we manage his BP  He denies CP, palpitations or cardiac awareness No SOB Live a primarily sedentary life His trip to [employer] is generally the most he does. Likes to watch TV and nap. No orthostatic symptoms No falls No bleeding or signs of bleeding   Device information MDT dual chamber PPM implanted 07/03/2021 (loop removed)  Arrhythmia/AAD hx Found vis device Dec 2023  Studies Reviewed: Marland Kitchen    EKG done today and reviewed by myself:  SR/V paced 69bpm  DEVICE interrogation done today and reviewed by myself Battery and lead measurements are good Rare short NSVT episodes No new AF episodes  07/03/2021: TTE 1. Left ventricular ejection fraction,  by estimation, is 60 to 65%. The  left ventricle has normal function. The left ventricle has no regional  wall motion abnormalities. There is mild concentric left ventricular  hypertrophy. Left ventricular diastolic  parameters are consistent with Grade I diastolic dysfunction (impaired  relaxation).   2. Right ventricular systolic function is normal. The right ventricular  size is normal. Tricuspid regurgitation signal is inadequate for assessing  PA pressure.   3. The mitral valve is grossly normal. No evidence of mitral valve  regurgitation. No evidence of mitral stenosis.   4. The aortic valve is tricuspid. Aortic valve regurgitation is not  visualized. No aortic stenosis is present.     Risk Assessment/Calculations:    Physical Exam:   VS:  There were no vitals taken for this visit.   Wt Readings from Last 3 Encounters:  02/22/23 272 lb (123.4 kg)  02/01/23 273 lb 3.2 oz (123.9 kg)  08/08/22 271 lb 12.8 oz (123.3 kg)    GEN: Well nourished, well developed in no acute distress NECK: No JVD; No carotid bruits CARDIAC: RRR, no murmurs, rubs, gallops RESPIRATORY:   CTA b/l without rales, wheezing or rhonchi  ABDOMEN: Soft, non-tender, non-distended EXTREMITIES:  No edema; No deformity   PPM site: is stable, no thinning, fluctuation, tethering  ASSESSMENT AND PLAN: .    PPM Intact function No programming changes He has AV conduction today (MVP is off) In  review of EKGs, his paced EKG (3830 lead in LB/septal position) QRS is narrower from RBBB >> paced QRS  Paroxysmal AFib CHA2DS2Vasc is 4, on eliquis Will get BMET today Advised better hydration  If his Creat remains elevated will need to reduce his Eliquis dose, perhaps consider alternative to his ARB Zero new AFib episodes  HTN Advised to hold his olmesartan for SBP < 110 May decided to change it pending updated Creat  Secondary hypercoagulable state 2/2 AFib    Dispo: back in 3-4 mo, sooner if  needed  Signed, Sheilah Pigeon, PA-C

## 2023-03-20 ENCOUNTER — Ambulatory Visit: Payer: PPO | Attending: Physician Assistant | Admitting: Physician Assistant

## 2023-03-20 ENCOUNTER — Encounter: Payer: Self-pay | Admitting: Physician Assistant

## 2023-03-20 VITALS — BP 114/62 | HR 69 | Ht 77.0 in | Wt 269.0 lb

## 2023-03-20 DIAGNOSIS — Z950 Presence of cardiac pacemaker: Secondary | ICD-10-CM | POA: Diagnosis not present

## 2023-03-20 DIAGNOSIS — D6869 Other thrombophilia: Secondary | ICD-10-CM | POA: Diagnosis not present

## 2023-03-20 DIAGNOSIS — I480 Paroxysmal atrial fibrillation: Secondary | ICD-10-CM | POA: Diagnosis not present

## 2023-03-20 DIAGNOSIS — I442 Atrioventricular block, complete: Secondary | ICD-10-CM | POA: Diagnosis not present

## 2023-03-20 DIAGNOSIS — I10 Essential (primary) hypertension: Secondary | ICD-10-CM

## 2023-03-20 DIAGNOSIS — I700 Atherosclerosis of aorta: Secondary | ICD-10-CM | POA: Diagnosis not present

## 2023-03-20 LAB — CUP PACEART INCLINIC DEVICE CHECK
Date Time Interrogation Session: 20241105164702
Implantable Lead Connection Status: 753985
Implantable Lead Connection Status: 753985
Implantable Lead Implant Date: 20230219
Implantable Lead Implant Date: 20230219
Implantable Lead Location: 753859
Implantable Lead Location: 753860
Implantable Lead Model: 3830
Implantable Lead Model: 5076
Implantable Pulse Generator Implant Date: 20230219
Lead Channel Pacing Threshold Amplitude: 0.75 V
Lead Channel Pacing Threshold Amplitude: 1 V
Lead Channel Pacing Threshold Pulse Width: 0.4 ms
Lead Channel Pacing Threshold Pulse Width: 0.4 ms
Lead Channel Sensing Intrinsic Amplitude: 17.3 mV
Lead Channel Sensing Intrinsic Amplitude: 3.4 mV

## 2023-03-20 NOTE — Patient Instructions (Addendum)
Medication Instructions:  HOLD Olmesartan (Benicar) if top number of blood pressure is less than 110 *If you need a refill on your cardiac medications before your next appointment, please call your pharmacy*   Lab Work: TODAY-BMET  If you have labs (blood work) drawn today and your tests are completely normal, you will receive your results only by: MyChart Message (if you have MyChart) OR A paper copy in the mail If you have any lab test that is abnormal or we need to change your treatment, we will call you to review the results.   Testing/Procedures: NONE ORDERED   Follow-Up: At [facility], you and your health needs are our priority.  As part of our continuing mission to provide you with exceptional heart care, we have created designated Provider Care Teams.  These Care Teams include your primary Cardiologist (physician) and Advanced Practice Providers (APPs -  Physician Assistants and Nurse Practitioners) who all work together to provide you with the care you need, when you need it.  We recommend signing up for the patient portal called "MyChart".  Sign up information is provided on this After Visit Summary.  MyChart is used to connect with patients for Virtual Visits (Telemedicine).  Patients are able to view lab/test results, encounter notes, upcoming appointments, etc.  Non-urgent messages can be sent to your provider as well.   To learn more about what you can do with MyChart, go to [email or web address].    Your next appointment:   2 month(s)  Provider:   Francis Dowse, PA-C    Other Instructions

## 2023-03-21 ENCOUNTER — Telehealth: Payer: Self-pay

## 2023-03-21 LAB — BASIC METABOLIC PANEL
BUN/Creatinine Ratio: 12 (ref 10–24)
BUN: 15 mg/dL (ref 8–27)
CO2: 26 mmol/L (ref 20–29)
Calcium: 8.7 mg/dL (ref 8.6–10.2)
Chloride: 102 mmol/L (ref 96–106)
Creatinine, Ser: 1.24 mg/dL (ref 0.76–1.27)
Glucose: 142 mg/dL — ABNORMAL HIGH (ref 70–99)
Potassium: 4 mmol/L (ref 3.5–5.2)
Sodium: 144 mmol/L (ref 134–144)
eGFR: 56 mL/min/{1.73_m2} — ABNORMAL LOW (ref >59)

## 2023-03-21 MED ORDER — OLMESARTAN MEDOXOMIL 20 MG PO TABS: 10.0000 mg | ORAL_TABLET | Freq: Every day | ORAL | 3 refills | Status: AC

## 2023-03-21 NOTE — Telephone Encounter (Signed)
-----   Message from Sheilah Pigeon sent at 03/21/2023  7:07 AM EST ----- Creat is back to wnl.  No change needed to [name] dose. Keep adequately hydrated. Lets go ahead and reduce his Olmesartan to 1/2 tab (10mg ) daily I gave hold parameters to not take if SBP is <110.... continue this guidline Follow up as planned

## 2023-03-21 NOTE — Telephone Encounter (Signed)
The patient has been notified of the result and verbalized understanding.  All questions (if any) were answered. Frutoso Schatz, RN 03/21/2023 8:32 AM

## 2023-03-28 ENCOUNTER — Encounter: Payer: Self-pay | Admitting: Internal Medicine

## 2023-04-02 ENCOUNTER — Ambulatory Visit (INDEPENDENT_AMBULATORY_CARE_PROVIDER_SITE_OTHER): Payer: PPO

## 2023-04-02 DIAGNOSIS — R55 Syncope and collapse: Secondary | ICD-10-CM

## 2023-04-02 DIAGNOSIS — I442 Atrioventricular block, complete: Secondary | ICD-10-CM

## 2023-04-03 LAB — CUP PACEART REMOTE DEVICE CHECK
Battery Remaining Longevity: 108 mo
Battery Voltage: 3.01 V
Brady Statistic AP VP Percent: 39.89 %
Brady Statistic AP VS Percent: 0.13 %
Brady Statistic AS VP Percent: 58.28 %
Brady Statistic AS VS Percent: 1.7 %
Brady Statistic RA Percent Paced: 39.88 %
Brady Statistic RV Percent Paced: 98.17 %
Date Time Interrogation Session: 20241117213056
Implantable Lead Connection Status: 753985
Implantable Lead Connection Status: 753985
Implantable Lead Implant Date: 20230219
Implantable Lead Implant Date: 20230219
Implantable Lead Location: 753859
Implantable Lead Location: 753860
Implantable Lead Model: 3830
Implantable Lead Model: 5076
Implantable Pulse Generator Implant Date: 20230219
Lead Channel Impedance Value: 323 Ohm
Lead Channel Impedance Value: 380 Ohm
Lead Channel Impedance Value: 456 Ohm
Lead Channel Impedance Value: 475 Ohm
Lead Channel Pacing Threshold Amplitude: 0.625 V
Lead Channel Pacing Threshold Amplitude: 1.375 V
Lead Channel Pacing Threshold Pulse Width: 0.4 ms
Lead Channel Pacing Threshold Pulse Width: 0.4 ms
Lead Channel Sensing Intrinsic Amplitude: 10.75 mV
Lead Channel Sensing Intrinsic Amplitude: 10.75 mV
Lead Channel Sensing Intrinsic Amplitude: 5.25 mV
Lead Channel Sensing Intrinsic Amplitude: 5.25 mV
Lead Channel Setting Pacing Amplitude: 1.5 V
Lead Channel Setting Pacing Amplitude: 2.75 V
Lead Channel Setting Pacing Pulse Width: 0.4 ms
Lead Channel Setting Sensing Sensitivity: 1.2 mV
Zone Setting Status: 755011

## 2023-04-25 NOTE — Progress Notes (Signed)
Remote pacemaker transmission.   

## 2023-05-11 ENCOUNTER — Encounter: Payer: PPO | Admitting: Internal Medicine

## 2023-05-15 ENCOUNTER — Ambulatory Visit: Payer: PPO | Admitting: Internal Medicine

## 2023-05-15 ENCOUNTER — Encounter: Payer: Self-pay | Admitting: Internal Medicine

## 2023-05-15 VITALS — BP 122/60 | HR 100 | Temp 97.5°F | Resp 16 | Ht 75.0 in | Wt 269.2 lb

## 2023-05-15 DIAGNOSIS — Z950 Presence of cardiac pacemaker: Secondary | ICD-10-CM

## 2023-05-15 DIAGNOSIS — Z0000 Encounter for general adult medical examination without abnormal findings: Secondary | ICD-10-CM

## 2023-05-15 DIAGNOSIS — N1831 Chronic kidney disease, stage 3a: Secondary | ICD-10-CM

## 2023-05-15 DIAGNOSIS — I700 Atherosclerosis of aorta: Secondary | ICD-10-CM | POA: Diagnosis not present

## 2023-05-15 DIAGNOSIS — Z79899 Other long term (current) drug therapy: Secondary | ICD-10-CM

## 2023-05-15 DIAGNOSIS — Z136 Encounter for screening for cardiovascular disorders: Secondary | ICD-10-CM | POA: Diagnosis not present

## 2023-05-15 DIAGNOSIS — Z23 Encounter for immunization: Secondary | ICD-10-CM | POA: Diagnosis not present

## 2023-05-15 DIAGNOSIS — Z1211 Encounter for screening for malignant neoplasm of colon: Secondary | ICD-10-CM

## 2023-05-15 DIAGNOSIS — I10 Essential (primary) hypertension: Secondary | ICD-10-CM

## 2023-05-15 DIAGNOSIS — E559 Vitamin D deficiency, unspecified: Secondary | ICD-10-CM

## 2023-05-15 DIAGNOSIS — Z125 Encounter for screening for malignant neoplasm of prostate: Secondary | ICD-10-CM

## 2023-05-15 DIAGNOSIS — N138 Other obstructive and reflux uropathy: Secondary | ICD-10-CM | POA: Diagnosis not present

## 2023-05-15 DIAGNOSIS — J449 Chronic obstructive pulmonary disease, unspecified: Secondary | ICD-10-CM

## 2023-05-15 DIAGNOSIS — E538 Deficiency of other specified B group vitamins: Secondary | ICD-10-CM | POA: Diagnosis not present

## 2023-05-15 DIAGNOSIS — Z0001 Encounter for general adult medical examination with abnormal findings: Secondary | ICD-10-CM

## 2023-05-15 DIAGNOSIS — N401 Enlarged prostate with lower urinary tract symptoms: Secondary | ICD-10-CM | POA: Diagnosis not present

## 2023-05-15 DIAGNOSIS — R7309 Other abnormal glucose: Secondary | ICD-10-CM

## 2023-05-15 DIAGNOSIS — I442 Atrioventricular block, complete: Secondary | ICD-10-CM

## 2023-05-15 DIAGNOSIS — E782 Mixed hyperlipidemia: Secondary | ICD-10-CM | POA: Diagnosis not present

## 2023-05-15 DIAGNOSIS — Z87891 Personal history of nicotine dependence: Secondary | ICD-10-CM

## 2023-05-15 DIAGNOSIS — M0579 Rheumatoid arthritis with rheumatoid factor of multiple sites without organ or systems involvement: Secondary | ICD-10-CM

## 2023-05-15 NOTE — Progress Notes (Signed)
 [facility]      ADULT   &   ADOLESCENT      INTERNAL MEDICINE  Elsie Richards, M.D.          Lonell Rous, ANP        Bascom Necessary, FNP  [facility] [address]  [place], [place]. [address] Telephone [phone] Telefax [phone]   Annual  Screening/Preventative Visit  & Comprehensive Evaluation & Examination   Future Appointments  Date Time Provider Department  05/15/2023                     cpe  4:00 PM Richards Elsie, MD [facility]  02/20/2024  2:20 PM Leverne Charlies Helling, PA-C [facility]  05/29/2024                      cpe 10:00 AM Richards Elsie, MD [facility]       This very nice 89 y.o.  WWM  with   HTN, HLD, Prediabetes,  IBS & GERD and Vitamin D  Deficiency  presents for a Screening /Preventative Visit & comprehensive evaluation and management of multiple medical co-morbidities.   Patient is followed  for Rheumatoid Arthritis (2010) in the past by Dr Jon Learn & currently he seems well controlled on low dose prednisone  5 mg which also benefits his chronic COPD /asthma for which he is followed by Dr Arlester Chris for his COPD /Asthma.       HTN predates circa 1999. Patient's BP has been controlled at home.  Today's BP is at goal - 122/60 . Patient has CKD3a (GFR 57).   In Feb 2023 , patient was admitted with Syncope  & had a PPM implanted for CHB by Dr. Ole Holts .   Recently his remote monitoring found Atrial Fibrillation & he was referred to the Afib clinic & started on Eliquis . Patient denies any cardiac symptoms as chest pain, palpitations, shortness of breath, dizziness or ankle swelling.       Patient's hyperlipidemia is controlled with diet and Rosuvastatin . Patient denies myalgias or other medication SE's. Last lipids were at goal except elevated Trig's:  Lab Results  Component Value Date   CHOL 133 05/15/2023   HDL 46 05/15/2023   LDLCALC 64 05/15/2023   TRIG 145 05/15/2023   CHOLHDL 2.9 05/15/2023        Patient  has Moderate  Obesity (BMI 33+) and  hx/o prediabetes (A1c 5.7% / 2013) and patient denies reactive hypoglycemic symptoms, visual blurring, diabetic polys or paresthesias. Last A1c was near goal:  Lab Results  Component Value Date   HGBA1C 5.8 (H) 05/15/2023        Finally, patient has history of Vitamin D  Deficiency  (25 /2008) and last vitamin D  was slightly elevated & dose was  at goal :  Lab Results  Component Value Date   VD25OH 57 05/15/2023                                                              Current Outpatient Medications  Medication Instructions   apixaban  (ELIQUIS ) 5 MG  Take  1 tablet   2 x / day      VITAMIN B12   1,000 mcg 3 times weekly   finasteride   5 MG  Take  1 tablet  Daily     furosemide  40 MG tablet TAKE 1 TABLET DAILY    magnesium    500 mg,  Daily    BENICAR    10 mg  Daily   OTC  Zinc , Quercitin,  Vit C  & Vit D 1 capsule Daily   predniSONE  MG tablet Take 1 tablet by mouth twice a day    rosuvastatin   20 MG tablet Take 1 tablet daily    terazosin   10 MG capsule Take  1 capsule  at Bedtime    terbinafine   250 MG tablet Take 1 tablet Daily for ToeNail Fungus & Athlete's Foot for 30 days straight then stop for 30 days and repeat cycle   Vitamin D  5,000 Units  As directed     Allergies  Allergen Reactions   Ace Inhibitors    Asa [Aspirin]     High Dose asprin   Penicillins Hives   Vasotec [Enalapril]     Cough   Clindamycin/Lincomycin     Hives   Past Medical History:  Diagnosis Date   Allergy    Asthma    BPH (benign prostatic hypertrophy)    BPH (benign prostatic hypertrophy)    COPD (chronic obstructive pulmonary disease) (HCC)    DJD (degenerative joint disease)    GERD (gastroesophageal reflux disease)    History of urinary retention    Hypertension    IBS (irritable bowel syndrome)    RBBB    Rheumatoid arthritis(714.0)    DR. HAWKES   Scrotal lesion    Short of breath on exertion    Health Maintenance  Topic Date Due    INFLUENZA VACCINE  12/14/2019   TETANUS/TDAP  04/15/2024   COVID-19 Vaccine  Completed   PNA vac Low Risk Adult  Completed   Immunization History  Administered Date(s) Administered   DT (Pediatric) 04/15/2014   Influenza, High Dose  02/18/2016, 02/04/2018   Influenza 03/10/2013, 02/07/2017, 02/13/2019   PFIZER SARS-COV-2 Vacc 05/27/2019, 06/15/2019   Pneumococcal -13 04/15/2014   Pneumococcal Po-23 05/15/2001, 03/30/2011   Td 05/15/2002   Zoster 04/01/2012   Last Colon - 09/28/2009 - Dr JOLYNN Hint - No f/u recommended due to age  Past Surgical History:  Procedure Laterality Date   CARDIOVASCULAR STRESS TEST  04-26-2011  DR BRACKBILL   LOW RISK STRESS NUCLEAR STUDY/ NO EVIDENCE ISCHEMIA   LUMBAR FUSION  X3  LAST ONE 1996   SCROTAL EXPLORATION Left 07/17/2012   Procedure:  EXCISION  OF LEFT SCROTAL SKIN LESION;  Surgeon: Toribio Neysa Repine, MD;  Location[facility];  Service: Urology;  Laterality: Left;   TRANSTHORACIC ECHOCARDIOGRAM  04-26-2011     MILD LVH/ LVSF NORMAL/ EF 60-65%/ GRADE I DIASTOLIC DYSFUNCTION/ MILD MITRAL REGURG.   TRANSURETHRAL RESECTION OF PROSTATE  SEVERAL YRS AGO   Family History  Problem Relation Age of Onset   Heart disease Father    Colon cancer Father    Hypertension Father    Heart disease Brother    Diabetes Sister    Social History   Socioeconomic History   Marital status: widowed   Number of children: 2 daughters & 5 GC  Occupational History   Occupation: Retired Naval Architect  Tobacco Use   Smoking status: Former Smoker    Packs/day: 1.00    Years: 30.00    Pack years: 30.00    Types: Cigarettes    Quit date: 07/11/1980    Years since  quitting: 39.6   Smokeless tobacco: Former Neurosurgeon    Types: Chew  Substance and Sexual Activity   Alcohol use: No   Drug use: No   Sexual activity: Not on file   ROS Constitutional: Denies fever, chills, weight loss/gain, headaches, insomnia,  night sweats or change in appetite. Does  c/o fatigue. Eyes: Denies redness, blurred vision, diplopia, discharge, itchy or watery eyes.  ENT: Denies discharge, congestion, post nasal drip, epistaxis, sore throat, earache, hearing loss, dental pain, Tinnitus, Vertigo, Sinus pain or snoring.  Cardio: Denies chest pain, palpitations, irregular heartbeat, syncope, dyspnea, diaphoresis, orthopnea, PND, claudication or edema Respiratory: denies cough, dyspnea, DOE, pleurisy, hoarseness, laryngitis or wheezing.  Gastrointestinal: Denies dysphagia, heartburn, reflux, water brash, pain, cramps, nausea, vomiting, bloating, diarrhea, constipation, hematemesis, melena, hematochezia, jaundice or hemorrhoids Genitourinary: Denies dysuria, frequency, urgency, nocturia, hesitancy, discharge, hematuria or flank pain Musculoskeletal: Denies arthralgia, myalgia, stiffness, Jt. Swelling, pain, limp or strain/sprain. Denies Falls. Skin: Denies puritis, rash, hives, warts, acne, eczema or change in skin lesion Neuro: No weakness, tremor, incoordination, spasms, paresthesia or pain Psychiatric: Denies confusion, memory loss or sensory loss. Denies Depression. Endocrine: Denies change in weight, skin, hair change, nocturia, and paresthesia, diabetic polys, visual blurring or hyper / hypo glycemic episodes.  Heme/Lymph: No excessive bleeding, bruising or enlarged lymph nodes.  Physical Exam  BP 122/60   Pulse 100   Temp (!) 97.5 F (36.4 C)   Resp 16   Ht 6' 3 (1.905 m)   Wt 269 lb 3.2 oz (122.1 kg)   SpO2 96%   BMI 33.65 kg/m   General Appearance: Well nourished and well groomed and in no apparent distress.  Eyes: PERRLA, EOMs, conjunctiva no swelling or erythema, normal fundi and vessels. Sinuses: No frontal/maxillary tenderness ENT/Mouth: EACs patent / TMs  nl. Nares clear without erythema, swelling, mucoid exudates. Oral hygiene is good. No erythema, swelling, or exudate. Tongue normal, non-obstructing. Tonsils not swollen or erythematous.  Hearing normal.  Neck: Supple, thyroid  not palpable. No bruits, nodes or JVD. Respiratory: Respiratory effort normal.  BS equal and clear bilateral without rales, rhonci, wheezing or stridor. Cardio: Heart sounds are normal with regular rate and rhythm and no murmurs, rubs or gallops. Peripheral pulses are normal and equal bilaterally without edema. No aortic or femoral bruits. Chest: symmetric with normal excursions and percussion.  Abdomen: Soft, with Nl bowel sounds. Nontender, no guarding, rebound, hernias, masses, or organomegaly.  Lymphatics: Non tender without lymphadenopathy.  Musculoskeletal: Full ROM all peripheral extremities, joint stability, 5/5 strength, and normal gait. Skin: Warm and dry without rashes, lesions, cyanosis, clubbing or  ecchymosis.  Neuro: Cranial nerves intact, reflexes equal bilaterally. Normal muscle tone, no cerebellar symptoms. Sensation intact.  Pysch: Alert and oriented X 3 with normal affect, insight and judgment appropriate.   Assessment and Plan  1. Annual Preventative/Screening Exam    2. Essential hypertension  - EKG 12-Lead - US , RETROPERITNL ABD,  LTD - Urinalysis, Routine w reflex microscopic - Microalbumin / creatinine urine ratio - CBC with Differential/Platelet - COMPLETE METABOLIC PANEL WITH GFR - Magnesium  - TSH  3. Hyperlipidemia, mixed  - EKG 12-Lead - US , RETROPERITNL ABD,  LTD - Lipid panel - TSH  4. Abnormal glucose  - EKG 12-Lead - US , RETROPERITNL ABD,  LTD - Hemoglobin A1c - Insulin , random  5. Vitamin D  deficiency  - VITAMIN D  25 Hydroxy   6. S/P placement of cardiac pacemaker  - EKG 12-Lead  7. Heart block AV complete (HCC) -  s/p PPM 07/03/2021  - EKG 12-Lead   8. Aortic atherosclerosis (HCC) by CT scan 2011  - US , RETROPERITNL ABD,  LTD - Urinalysis, Routine w reflex microscopic - Lipid panel   9. Chronic obstructive pulmonary disease (HCC)   10. B12 deficiency  - Vitamin B12 - CBC with  Differential/Platelet   11. BPH with obstruction/lower urinary tract symptoms  - PSA   12. Rheumatoid arthritis involving multiple sites with positive rheumatoid factor (HCC)   13. Stage 3a chronic kidney disease (HCC)  - Urinalysis, Routine w reflex microscopic - Microalbumin / creatinine urine ratio - PTH, intact and calcium    14. Screening for colorectal cancer  - POC Hemoccult Bld/Stl    15. Prostate cancer screening  - PSA   16. Screening for heart disease  - EKG 12-Lead   17. Former smoker  - EKG 12-Lead - US , RETROPERITNL ABD,  LTD   18. Screening for AAA (aortic abdominal aneurysm)  - US , RETROPERITNL ABD,  LTD   19. Medication management  - Vitamin B12 - CBC with Differential/Platelet - COMPLETE METABOLIC PANEL WITH GFR - Magnesium  - Lipid panel - TSH - Hemoglobin A1c - Insulin , random - VITAMIN D  25 Hydroxy          Patient was counseled in prudent diet, weight control to achieve/maintain BMI less than 25, BP monitoring, regular exercise and medications as discussed.  Discussed med effects and SE's. Routine screening labs and tests as requested with regular follow-up as recommended. Over 40 minutes of exam, counseling, chart review and high complex critical decision making was performed   Elsie JONETTA Richards, MD

## 2023-05-15 NOTE — Patient Instructions (Signed)

## 2023-05-16 LAB — CBC WITH DIFFERENTIAL/PLATELET
Absolute Lymphocytes: 1972 {cells}/uL (ref 850–3900)
Absolute Monocytes: 541 {cells}/uL (ref 200–950)
Basophils Absolute: 32 {cells}/uL (ref 0–200)
Basophils Relative: 0.3 %
Eosinophils Absolute: 11 {cells}/uL — ABNORMAL LOW (ref 15–500)
Eosinophils Relative: 0.1 %
HCT: 39.6 % (ref 38.5–50.0)
Hemoglobin: 13.1 g/dL — ABNORMAL LOW (ref 13.2–17.1)
MCH: 30.9 pg (ref 27.0–33.0)
MCHC: 33.1 g/dL (ref 32.0–36.0)
MCV: 93.4 fL (ref 80.0–100.0)
MPV: 10 fL (ref 7.5–12.5)
Monocytes Relative: 5.1 %
Neutro Abs: 8045 {cells}/uL — ABNORMAL HIGH (ref 1500–7800)
Neutrophils Relative %: 75.9 %
Platelets: 251 10*3/uL (ref 140–400)
RBC: 4.24 10*6/uL (ref 4.20–5.80)
RDW: 12.6 % (ref 11.0–15.0)
Total Lymphocyte: 18.6 %
WBC: 10.6 10*3/uL (ref 3.8–10.8)

## 2023-05-16 LAB — URINALYSIS, ROUTINE W REFLEX MICROSCOPIC
Bacteria, UA: NONE SEEN /[HPF]
Bilirubin Urine: NEGATIVE
Glucose, UA: NEGATIVE
Hgb urine dipstick: NEGATIVE
Hyaline Cast: NONE SEEN /[LPF]
Ketones, ur: NEGATIVE
Leukocytes,Ua: NEGATIVE
Nitrite: NEGATIVE
RBC / HPF: NONE SEEN /[HPF] (ref 0–2)
Specific Gravity, Urine: 1.014 (ref 1.001–1.035)
Squamous Epithelial / HPF: NONE SEEN /[HPF] (ref <=5)
pH: 6.5 (ref 5.0–8.0)

## 2023-05-16 LAB — COMPLETE METABOLIC PANEL WITH GFR
AG Ratio: 1.4 (calc) (ref 1.0–2.5)
ALT: 17 U/L (ref 9–46)
AST: 15 U/L (ref 10–35)
Albumin: 3.9 g/dL (ref 3.6–5.1)
Alkaline phosphatase (APISO): 89 U/L (ref 35–144)
BUN/Creatinine Ratio: 12 (calc) (ref 6–22)
BUN: 15 mg/dL (ref 7–25)
CO2: 27 mmol/L (ref 20–32)
Calcium: 8.8 mg/dL (ref 8.6–10.3)
Chloride: 104 mmol/L (ref 98–110)
Creat: 1.24 mg/dL — ABNORMAL HIGH (ref 0.70–1.22)
Globulin: 2.7 g/dL (ref 1.9–3.7)
Glucose, Bld: 132 mg/dL — ABNORMAL HIGH (ref 65–99)
Potassium: 4.1 mmol/L (ref 3.5–5.3)
Sodium: 143 mmol/L (ref 135–146)
Total Bilirubin: 0.6 mg/dL (ref 0.2–1.2)
Total Protein: 6.6 g/dL (ref 6.1–8.1)
eGFR: 55 mL/min/{1.73_m2} — ABNORMAL LOW (ref >=60)

## 2023-05-16 LAB — HEMOGLOBIN A1C
Hgb A1c MFr Bld: 5.8 %{Hb} — ABNORMAL HIGH (ref <5.7)
Mean Plasma Glucose: 120 mg/dL
eAG (mmol/L): 6.6 mmol/L

## 2023-05-16 LAB — MICROSCOPIC MESSAGE

## 2023-05-16 LAB — PARATHYROID HORMONE, INTACT (NO CA): PTH: 150 pg/mL — ABNORMAL HIGH (ref 16–77)

## 2023-05-16 LAB — INSULIN, RANDOM: Insulin: 149.6 u[IU]/mL — ABNORMAL HIGH

## 2023-05-16 LAB — VITAMIN B12: Vitamin B-12: 550 pg/mL (ref 200–1100)

## 2023-05-16 LAB — LIPID PANEL
Cholesterol: 133 mg/dL (ref <200)
HDL: 46 mg/dL (ref >=40)
LDL Cholesterol (Calc): 64 mg/dL
Non-HDL Cholesterol (Calc): 87 mg/dL (ref <130)
Total CHOL/HDL Ratio: 2.9 (calc) (ref <5.0)
Triglycerides: 145 mg/dL (ref <150)

## 2023-05-16 LAB — MICROALBUMIN / CREATININE URINE RATIO
Creatinine, Urine: 109 mg/dL (ref 20–320)
Microalb Creat Ratio: 51 mg/g{creat} — ABNORMAL HIGH (ref <30)
Microalb, Ur: 5.6 mg/dL

## 2023-05-16 LAB — VITAMIN D 25 HYDROXY (VIT D DEFICIENCY, FRACTURES): Vit D, 25-Hydroxy: 57 ng/mL (ref 30–100)

## 2023-05-16 LAB — PSA: PSA: 0.37 ng/mL (ref <=4.00)

## 2023-05-16 LAB — MAGNESIUM: Magnesium: 2.1 mg/dL (ref 1.5–2.5)

## 2023-05-16 LAB — TSH: TSH: 1.65 m[IU]/L (ref 0.40–4.50)

## 2023-05-16 NOTE — Progress Notes (Signed)
 [] [] [] [] [] [] [] [] [] [] [] [] [] [] [] [] [] [] [] [] [] [] [] [] [] [] [] [] [] [] [] [] [] [] [] [] [] [] [] [] [] ][] [] [] [] [] [] [] [] [] [] [] [] [] [] [] [] [] [] [] [] [] [] [[] [] [] [] []   [] [] [] [] [] [] [] [] [] [] [] [] [] [] [] [] [] [] [] [] [] [] [] [] [] [] [] [] [] [] [] [] [] [] [] [] [] [] [] [] [] ][] [] [] [] [] [] [] [] [] [] [] [] [] [] [] [] [] [] [] [] [] [] [[] [] [] [] []  -Test results slightly outside the reference range are not unusual. If there is anything important, I will review this with you,  otherwise it is considered normal test values.  If you have further questions,  please do not hesitate to contact me at the office or via My Chart.  [] [] [] [] [] [] [] [] [] [] [] [] [] [] [] [] [] [] [] [] [] [] [] [] [] [] [] [] [] [] [] [] [] [] [] [] [] [] [] [] [] ][] [] [] [] [] [] [] [] [] [] [] [] [] [] [] [] [] [] [] [] [] [] [[] [] [] [] []   [] [] [] [] [] [] [] [] [] [] [] [] [] [] [] [] [] [] [] [] [] [] [] [] [] [] [] [] [] [] [] [] [] [] [] [] [] [] [] [] [] ][] [] [] [] [] [] [] [] [] [] [] [] [] [] [] [] [] [] [] [] [] [] [[] [] [] [] []   -  A1c = 5.8%  - Blood sugar and A1c are STILL elevated in the borderline and                                                                early or pre-diabetes range which has the same   300% increased risk for heart attack, stroke, cancer and                                                  alzheimer- type vascular dementia as full blown diabetes.   But the good news is that diet, exercise with                                                            weight loss can cure the early diabetes at this point.  [] [] [] [] [] [] [] [] [] [] [] [] [] [] [] [] [] [] [] [] [] [] [] [] [] [] [] [] [] [] [] [] [] [] [] [] [] [] [] [] [] ][] [] [] [] [] [] [] [] [] [] [] [] [] [] [] [] [] [] [] [] [] [] [[] [] [] [] []   -  It is very important that you work harder with diet by                                      avoiding all foods that are white except chicken,fish & calliflower.  - Avoid white rice  (brown & wild rice is OK),   - Avoid white potatoes  (sweet potatoes in moderation is OK),   White bread or wheat bread or anything made out of                                               white flour like bagels, donuts, rolls,  buns, biscuits, cakes,  - pastries, cookies, pizza crust, and pasta (made from white flour & egg whites)   - vegetarian pasta or spinach or wheat pasta is OK.  - Multigrain breads like Arnold's, Pepperidge Farm or                                                             multigrain sandwich thins or high fiber breads like   Eureka bread or Dave's Killer breads that are 4 to 5 grams fiber per slice !  are best.    Diet, exercise and weight loss can reverse and cure diabetes in the early stages.    - Diet, exercise and  weight loss is very important in the                                            control and prevention of complications of diabetes which   affects every system in your body, ie.   -Brain - dementia/stroke,   - eyes - glaucoma/blindness,   - heart - heart attack/heart failure,   - kidneys - dialysis,   - stomach - gastric paralysis,   - intestines - malabsorption,   - nerves - severe painful neuritis,   - circulation - gangrene & loss of a leg(s)   - and finally  . . . . . . . . . . . . . . . . . .    - cancer and Alzheimers.  [] [] [] [] [] [] [] [] [] [] [] [] [] [] [] [] [] [] [] [] [] [] [] [] [] [] [] [] [] [] [] [] [] [] [] [] [] [] [] [] [] ][] [] [] [] [] [] [] [] [] [] [] [] [] [] [] [] [] [] [] [] [] [] [[] [] [] [] []   -  Insulin  = 149.6 is very ELEVATED  ( Normal is less than 20  !  )                                           And shows insulin  resistance - a sign of early diabetes and                                   associated with a 300 % greater risk for                                   heart attacks, strokes, cancer & Alzheimer type vascular dementia   - All this can be cured  and prevented with losing weight   - get Dr Marty Fuhrman's book 'the End of Diabetes and                                                                                                     the End of Dieting    - and add many years of good health to your  life.  [] [] [] [] [] [] [] [] [] [] [] [] [] [] [] [] [] [] [] [] [] [] [] [] [] [] [] [] [] [] [] [] [] [] [] [] [] [] [] [] [] ][] [] [] [] [] [] [] [] [] [] [] [] [] [] [] [] [] [] [] [] [] [] [[] [] [] [] []   -  Vitamin B 12 is Normal & OK - Please continue dose same   [] [] [] [] [] [] [] [] [] [] [] [] [] [] [] [] [] [] [] [] [] [] [] [] [] [] [] [] [] [] [] [] [] [] [] [] [] [] [] [] [] ][] [] [] [] [] [] [] [] [] [] [] [] [] [] [] [] [] [] [] [] [] [] [[] [] [] [] []   -  PSA - very  low - Great   [] [] [] [] [] [] [] [] [] [] [] [] [] [] [] [] [] [] [] [] [] [] [] [] [] [] [] [] [] [] [] [] [] [] [] [] [] [] [] [] [] ][] [] [] [] [] [] [] [] [] [] [] [] [] [] [] [] [] [] [] [] [] [] [[] [] [] [] []   -  Chol = 133   &  LDL = 64   - Both  Excellent   - Very low risk for Heart Attack  / Stroke  [] [] [] [] [] [] [] [] [] [] [] [] [] [] [] [] [] [] [] [] [] [] [] [] [] [] [] [] [] [] [] [] [] [] [] [] [] [] [] [] [] ][] [] [] [] [] [] [] [] [] [] [] [] [] [] [] [] [] [] [] [] [] [] [[] [] [] [] []   -  Vitamin D  = 57 - OK         -   Keep dose same   [] [] [] [] [] [] [] [] [] [] [] [] [] [] [] [] [] [] [] [] [] [] [] [] [] [] [] [] [] [] [] [] [] [] [] [] [] [] [] [] [] ][] [] [] [] [] [] [] [] [] [] [] [] [] [] [] [] [] [] [] [] [] [] [[] [] [] [] []   -  All Else - CBC - Kidneys - Electrolytes - Liver - Magnesium  & Thyroid     - all  Normal / OK  [] [] [] [] [] [] [] [] [] [] [] [] [] [] [] [] [] [] [] [] [] [] [] [] [] [] [] [] [] [] [] [] [] [] [] [] [] [] [] [] [] ][] [] [] [] [] [] [] [] [] [] [] [] [] [] [] [] [] [] [] [] [] [] [[] [] [] [] [] 

## 2023-05-17 ENCOUNTER — Other Ambulatory Visit: Payer: Self-pay

## 2023-05-17 MED ORDER — APIXABAN 5 MG PO TABS: 5.0000 mg | ORAL_TABLET | Freq: Two times a day (BID) | ORAL | 0 refills | Status: DC

## 2023-05-18 ENCOUNTER — Other Ambulatory Visit: Payer: Self-pay

## 2023-05-18 MED ORDER — APIXABAN 5 MG PO TABS: 5.0000 mg | ORAL_TABLET | Freq: Two times a day (BID) | ORAL | 1 refills | Status: DC

## 2023-05-20 ENCOUNTER — Other Ambulatory Visit: Payer: Self-pay | Admitting: Internal Medicine

## 2023-05-20 ENCOUNTER — Encounter: Payer: Self-pay | Admitting: Internal Medicine

## 2023-05-20 DIAGNOSIS — I480 Paroxysmal atrial fibrillation: Secondary | ICD-10-CM

## 2023-05-20 MED ORDER — APIXABAN 5 MG PO TABS: ORAL_TABLET | ORAL | 3 refills | Status: AC

## 2023-05-21 NOTE — Progress Notes (Signed)
 Cardiology Office Note:  .   Date:  05/21/2023  ID:  [name], DOB 02-Dec-1930, MRN [id number] PCP: Tonita Fallow, MD  [facility] Providers Cardiologist:  None Electrophysiologist:  [name] [name], MD {  History of Present Illness: .   [name] is a 89 y.o. male w/PMHx of RBBB, HTN, RA, asthma/COPD, DJD, GERD, symptomatic brady w/PPM, AFib  He saw Dr. Holts May 2023, doing well, stable home weights, back pain limiting him, no changes made.  He saw AFib clinic Dec 2023, brief episode, started on OAC, no AAD  I saw him 03/20/23 He is accompanied by his daughter (a [employer]DR[employer]ISER) The pt reports doing well, turns 89 in a few days! His daughter reports that late September she noticed (via [employer]) that he had not gotten up and gone to [employer] like he does usually without fail for breakfast. She went to check on him and he was a bit lethargic, his BP 70's/40' He did not want her to call 911, and was able to eventually bget him to the PMD BP better but still low. As it turned out he was taking his olmesart and AND an old losartan  rx as well. Both were held and eventually his BP normalized and then high again towars 150's or so. His olmesartan  resumed His PMD preferred that we manage his BP Otherwise no cardiac c/o or concerns Advised to hold his ARB for SBP < 110 Noted his paced QRS morphology/duration better paced (LB position) MVP off AF burden zero  Today's visit is scheduled as a 2 mo f/u  ROS:   He is accompanied by his daughter Doing well BPs generally 110's/50s, a few lower and higher He denies any dizzy spells, weak spells, no near syncope or syncope No CP, SOB No bleeding/signs of bleeding Just saw his PMD, labs/EKG, exam > reduced his lasix  to QOD   Device information MDT dual chamber PPM implanted 07/03/2021 (loop removed)  Arrhythmia/AAD hx Found vis device Dec 2023  Studies Reviewed: SABRA    EKG not done today  DEVICE interrogation done  today and reviewed by myself Battery and lead measurements are good On episode labeled monitored VT> EGMs c/w an ATach, PACs, PVCs  AP 37.4% VP 97.4%  07/03/2021: TTE 1. Left ventricular ejection fraction, by estimation, is 60 to 65%. The  left ventricle has normal function. The left ventricle has no regional  wall motion abnormalities. There is mild concentric left ventricular  hypertrophy. Left ventricular diastolic  parameters are consistent with Grade I diastolic dysfunction (impaired  relaxation).   2. Right ventricular systolic function is normal. The right ventricular  size is normal. Tricuspid regurgitation signal is inadequate for assessing  PA pressure.   3. The mitral valve is grossly normal. No evidence of mitral valve  regurgitation. No evidence of mitral stenosis.   4. The aortic valve is tricuspid. Aortic valve regurgitation is not  visualized. No aortic stenosis is present.     Risk Assessment/Calculations:    Physical Exam:   VS:  There were no vitals taken for this visit.   Wt Readings from Last 3 Encounters:  05/15/23 269 lb 3.2 oz (122.1 kg)  03/20/23 269 lb (122 kg)  02/22/23 272 lb (123.4 kg)    GEN: Well nourished, well developed in no acute distress NECK: No JVD; No carotid bruits CARDIAC: RRR, no murmurs, rubs, gallops RESPIRATORY:  CTA b/l without rales, wheezing or rhonchi  ABDOMEN: Soft, non-tender, non-distended EXTREMITIES:  No edema; No deformity   PPM site: is stable, no thinning, fluctuation, tethering  ASSESSMENT AND PLAN: .    PPM Intact function No programming changes  Paroxysmal AFib CHA2DS2Vasc is 4, on eliquis , appropriately dosed Burden is zero Had one PAT   HTN Relative low BPs Looks better  Secondary hypercoagulable state 2/2 AFib    Dispo: back in 6 mo, sooner if needed  Signed, Charlies Macario Arthur, PA-C

## 2023-05-23 ENCOUNTER — Ambulatory Visit: Payer: PPO | Attending: Physician Assistant | Admitting: Physician Assistant

## 2023-05-23 ENCOUNTER — Encounter: Payer: Self-pay | Admitting: Physician Assistant

## 2023-05-23 VITALS — BP 100/40 | HR 92 | Ht 77.0 in | Wt 239.0 lb

## 2023-05-23 DIAGNOSIS — I10 Essential (primary) hypertension: Secondary | ICD-10-CM

## 2023-05-23 DIAGNOSIS — D6869 Other thrombophilia: Secondary | ICD-10-CM

## 2023-05-23 DIAGNOSIS — Z950 Presence of cardiac pacemaker: Secondary | ICD-10-CM | POA: Diagnosis not present

## 2023-05-23 DIAGNOSIS — I480 Paroxysmal atrial fibrillation: Secondary | ICD-10-CM | POA: Diagnosis not present

## 2023-05-23 LAB — CUP PACEART INCLINIC DEVICE CHECK
Battery Remaining Longevity: 116 mo
Battery Voltage: 3.01 V
Brady Statistic AP VP Percent: 37.06 %
Brady Statistic AP VS Percent: 0.13 %
Brady Statistic AS VP Percent: 60.31 %
Brady Statistic AS VS Percent: 2.5 %
Brady Statistic RA Percent Paced: 37.4 %
Brady Statistic RV Percent Paced: 97.37 %
Date Time Interrogation Session: 20250108152057
Implantable Lead Connection Status: 753985
Implantable Lead Connection Status: 753985
Implantable Lead Implant Date: 20230219
Implantable Lead Implant Date: 20230219
Implantable Lead Location: 753859
Implantable Lead Location: 753860
Implantable Lead Model: 3830
Implantable Lead Model: 5076
Implantable Pulse Generator Implant Date: 20230219
Lead Channel Impedance Value: 323 Ohm
Lead Channel Impedance Value: 380 Ohm
Lead Channel Impedance Value: 475 Ohm
Lead Channel Impedance Value: 494 Ohm
Lead Channel Pacing Threshold Amplitude: 0.625 V
Lead Channel Pacing Threshold Amplitude: 1.25 V
Lead Channel Pacing Threshold Pulse Width: 0.4 ms
Lead Channel Pacing Threshold Pulse Width: 0.4 ms
Lead Channel Sensing Intrinsic Amplitude: 11.125 mV
Lead Channel Sensing Intrinsic Amplitude: 12.75 mV
Lead Channel Sensing Intrinsic Amplitude: 3.125 mV
Lead Channel Sensing Intrinsic Amplitude: 3.625 mV
Lead Channel Setting Pacing Amplitude: 1.5 V
Lead Channel Setting Pacing Amplitude: 2.5 V
Lead Channel Setting Pacing Pulse Width: 0.4 ms
Lead Channel Setting Sensing Sensitivity: 1.2 mV
Zone Setting Status: 755011

## 2023-05-23 NOTE — Patient Instructions (Signed)
 Medication Instructions:   Your physician recommends that you continue on your current medications as directed. Please refer to the Current Medication list given to you today.  *If you need a refill on your cardiac medications before your next appointment, please call your pharmacy*   Lab Work: NONE ORDERED  TODAY    If you have labs (blood work) drawn today and your tests are completely normal, you will receive your results only by: MyChart Message (if you have MyChart) OR A paper copy in the mail If you have any lab test that is abnormal or we need to change your treatment, we will call you to review the results.   Testing/Procedures: NONE ORDERED  TODAY     Follow-Up: At [facility], you and your health needs are our priority.  As part of our continuing mission to provide you with exceptional heart care, we have created designated Provider Care Teams.  These Care Teams include your primary Cardiologist (physician) and Advanced Practice Providers (APPs -  Physician Assistants and Nurse Practitioners) who all work together to provide you with the care you need, when you need it.  We recommend signing up for the patient portal called MyChart.  Sign up information is provided on this After Visit Summary.  MyChart is used to connect with patients for Virtual Visits (Telemedicine).  Patients are able to view lab/test results, encounter notes, upcoming appointments, etc.  Non-urgent messages can be sent to your provider as well.   To learn more about what you can do with MyChart, go to [email or web address].    Your next appointment:    3 month(s) ( CONTACT  CASSIE HALL/ ANGELINE HAMMER FOR EP SCHEDULING ISSUES )   Provider:    You may see one of the following Advanced Practice Providers on your designated Care Team:   Charlies Arthur, PA-C Michael Andy Tillery, PA-C Daphne Barrack, NP    Other Instructions

## 2023-06-04 ENCOUNTER — Telehealth: Payer: Self-pay | Admitting: Nurse Practitioner

## 2023-06-04 DIAGNOSIS — R609 Edema, unspecified: Secondary | ICD-10-CM

## 2023-06-04 DIAGNOSIS — I10 Essential (primary) hypertension: Secondary | ICD-10-CM

## 2023-06-04 NOTE — Telephone Encounter (Signed)
Please refill Furosemide. Pharm: [facility] [employer], [place] - [address]

## 2023-06-04 NOTE — Addendum Note (Signed)
Addended by: Dionicio Stall on: 06/04/2023 09:49 AM   Modules accepted: Orders

## 2023-06-12 ENCOUNTER — Emergency Department (HOSPITAL_COMMUNITY): Payer: PPO

## 2023-06-12 ENCOUNTER — Inpatient Hospital Stay (HOSPITAL_COMMUNITY)
Admission: EM | Admit: 2023-06-12 | Discharge: 2023-06-14 | DRG: 391 | Disposition: A | Discharge status: Home Health | Payer: PPO | Attending: Internal Medicine | Admitting: Internal Medicine

## 2023-06-12 ENCOUNTER — Other Ambulatory Visit: Payer: Self-pay

## 2023-06-12 ENCOUNTER — Encounter (HOSPITAL_COMMUNITY): Payer: Self-pay

## 2023-06-12 DIAGNOSIS — N401 Enlarged prostate with lower urinary tract symptoms: Secondary | ICD-10-CM

## 2023-06-12 DIAGNOSIS — D631 Anemia in chronic kidney disease: Secondary | ICD-10-CM | POA: Diagnosis present

## 2023-06-12 DIAGNOSIS — Z886 Allergy status to analgesic agent status: Secondary | ICD-10-CM

## 2023-06-12 DIAGNOSIS — A0839 Other viral enteritis: Secondary | ICD-10-CM

## 2023-06-12 DIAGNOSIS — R000 Tachycardia, unspecified: Secondary | ICD-10-CM | POA: Diagnosis present

## 2023-06-12 DIAGNOSIS — D649 Anemia, unspecified: Secondary | ICD-10-CM | POA: Diagnosis not present

## 2023-06-12 DIAGNOSIS — Z7901 Long term (current) use of anticoagulants: Secondary | ICD-10-CM | POA: Diagnosis not present

## 2023-06-12 DIAGNOSIS — E872 Acidosis, unspecified: Secondary | ICD-10-CM

## 2023-06-12 DIAGNOSIS — R55 Syncope and collapse: Secondary | ICD-10-CM | POA: Diagnosis present

## 2023-06-12 DIAGNOSIS — N189 Chronic kidney disease, unspecified: Secondary | ICD-10-CM | POA: Diagnosis not present

## 2023-06-12 DIAGNOSIS — R3129 Other microscopic hematuria: Secondary | ICD-10-CM | POA: Diagnosis present

## 2023-06-12 DIAGNOSIS — W19XXXA Unspecified fall, initial encounter: Principal | ICD-10-CM | POA: Diagnosis present

## 2023-06-12 DIAGNOSIS — M0579 Rheumatoid arthritis with rheumatoid factor of multiple sites without organ or systems involvement: Secondary | ICD-10-CM

## 2023-06-12 DIAGNOSIS — Z888 Allergy status to other drugs, medicaments and biological substances status: Secondary | ICD-10-CM

## 2023-06-12 DIAGNOSIS — M069 Rheumatoid arthritis, unspecified: Secondary | ICD-10-CM | POA: Diagnosis present

## 2023-06-12 DIAGNOSIS — K219 Gastro-esophageal reflux disease without esophagitis: Secondary | ICD-10-CM | POA: Diagnosis present

## 2023-06-12 DIAGNOSIS — J4489 Other specified chronic obstructive pulmonary disease: Secondary | ICD-10-CM | POA: Diagnosis present

## 2023-06-12 DIAGNOSIS — Y92009 Unspecified place in unspecified non-institutional (private) residence as the place of occurrence of the external cause: Secondary | ICD-10-CM | POA: Diagnosis not present

## 2023-06-12 DIAGNOSIS — K589 Irritable bowel syndrome without diarrhea: Secondary | ICD-10-CM | POA: Diagnosis present

## 2023-06-12 DIAGNOSIS — Z7952 Long term (current) use of systemic steroids: Secondary | ICD-10-CM

## 2023-06-12 DIAGNOSIS — E861 Hypovolemia: Secondary | ICD-10-CM | POA: Diagnosis present

## 2023-06-12 DIAGNOSIS — E782 Mixed hyperlipidemia: Secondary | ICD-10-CM | POA: Diagnosis present

## 2023-06-12 DIAGNOSIS — I129 Hypertensive chronic kidney disease with stage 1 through stage 4 chronic kidney disease, or unspecified chronic kidney disease: Secondary | ICD-10-CM | POA: Diagnosis present

## 2023-06-12 DIAGNOSIS — I4510 Unspecified right bundle-branch block: Secondary | ICD-10-CM | POA: Diagnosis present

## 2023-06-12 DIAGNOSIS — Z950 Presence of cardiac pacemaker: Secondary | ICD-10-CM | POA: Diagnosis not present

## 2023-06-12 DIAGNOSIS — N400 Enlarged prostate without lower urinary tract symptoms: Secondary | ICD-10-CM | POA: Diagnosis present

## 2023-06-12 DIAGNOSIS — F17290 Nicotine dependence, other tobacco product, uncomplicated: Secondary | ICD-10-CM | POA: Diagnosis present

## 2023-06-12 DIAGNOSIS — I959 Hypotension, unspecified: Secondary | ICD-10-CM | POA: Diagnosis not present

## 2023-06-12 DIAGNOSIS — U071 COVID-19: Secondary | ICD-10-CM | POA: Diagnosis present

## 2023-06-12 DIAGNOSIS — R197 Diarrhea, unspecified: Secondary | ICD-10-CM | POA: Diagnosis present

## 2023-06-12 DIAGNOSIS — N1832 Chronic kidney disease, stage 3b: Secondary | ICD-10-CM | POA: Diagnosis present

## 2023-06-12 DIAGNOSIS — Z880 Allergy status to penicillin: Secondary | ICD-10-CM

## 2023-06-12 DIAGNOSIS — Z8249 Family history of ischemic heart disease and other diseases of the circulatory system: Secondary | ICD-10-CM

## 2023-06-12 DIAGNOSIS — B353 Tinea pedis: Secondary | ICD-10-CM | POA: Diagnosis present

## 2023-06-12 DIAGNOSIS — N179 Acute kidney failure, unspecified: Secondary | ICD-10-CM

## 2023-06-12 DIAGNOSIS — Z79899 Other long term (current) drug therapy: Secondary | ICD-10-CM

## 2023-06-12 DIAGNOSIS — R159 Full incontinence of feces: Secondary | ICD-10-CM | POA: Diagnosis present

## 2023-06-12 DIAGNOSIS — Z881 Allergy status to other antibiotic agents status: Secondary | ICD-10-CM

## 2023-06-12 DIAGNOSIS — I480 Paroxysmal atrial fibrillation: Secondary | ICD-10-CM | POA: Diagnosis present

## 2023-06-12 DIAGNOSIS — A0811 Acute gastroenteropathy due to Norwalk agent: Principal | ICD-10-CM | POA: Diagnosis present

## 2023-06-12 LAB — URINALYSIS, ROUTINE W REFLEX MICROSCOPIC
Bilirubin Urine: NEGATIVE
Glucose, UA: NEGATIVE mg/dL
Ketones, ur: NEGATIVE mg/dL
Leukocytes,Ua: NEGATIVE
Nitrite: NEGATIVE
Protein, ur: 30 mg/dL — AB
Specific Gravity, Urine: 1.04 — ABNORMAL HIGH (ref 1.005–1.030)
pH: 5 (ref 5.0–8.0)

## 2023-06-12 LAB — TROPONIN I (HIGH SENSITIVITY)
Troponin I (High Sensitivity): 17 ng/L (ref <18)
Troponin I (High Sensitivity): 18 ng/L — ABNORMAL HIGH (ref <18)

## 2023-06-12 LAB — CBC WITH DIFFERENTIAL/PLATELET
Abs Immature Granulocytes: 0.08 10*3/uL — ABNORMAL HIGH (ref 0.00–0.07)
Basophils Absolute: 0 10*3/uL (ref 0.0–0.1)
Basophils Relative: 0 %
Eosinophils Absolute: 0 10*3/uL (ref 0.0–0.5)
Eosinophils Relative: 0 %
HCT: 36.5 % — ABNORMAL LOW (ref 39.0–52.0)
Hemoglobin: 12.1 g/dL — ABNORMAL LOW (ref 13.0–17.0)
Immature Granulocytes: 1 %
Lymphocytes Relative: 9 %
Lymphs Abs: 0.9 10*3/uL (ref 0.7–4.0)
MCH: 31.2 pg (ref 26.0–34.0)
MCHC: 33.2 g/dL (ref 30.0–36.0)
MCV: 94.1 fL (ref 80.0–100.0)
Monocytes Absolute: 1.1 10*3/uL — ABNORMAL HIGH (ref 0.1–1.0)
Monocytes Relative: 11 %
Neutro Abs: 8.1 10*3/uL — ABNORMAL HIGH (ref 1.7–7.7)
Neutrophils Relative %: 79 %
Platelets: 185 10*3/uL (ref 150–400)
RBC: 3.88 MIL/uL — ABNORMAL LOW (ref 4.22–5.81)
RDW: 13.2 % (ref 11.5–15.5)
WBC: 10.2 10*3/uL (ref 4.0–10.5)
nRBC: 0 % (ref 0.0–0.2)

## 2023-06-12 LAB — COMPREHENSIVE METABOLIC PANEL
ALT: 13 U/L (ref 0–44)
AST: 23 U/L (ref 15–41)
Albumin: 2.9 g/dL — ABNORMAL LOW (ref 3.5–5.0)
Alkaline Phosphatase: 50 U/L (ref 38–126)
Anion gap: 13 (ref 5–15)
BUN: 20 mg/dL (ref 8–23)
CO2: 20 mmol/L — ABNORMAL LOW (ref 22–32)
Calcium: 7.9 mg/dL — ABNORMAL LOW (ref 8.9–10.3)
Chloride: 103 mmol/L (ref 98–111)
Creatinine, Ser: 1.73 mg/dL — ABNORMAL HIGH (ref 0.61–1.24)
GFR, Estimated: 37 mL/min — ABNORMAL LOW (ref >60)
Glucose, Bld: 105 mg/dL — ABNORMAL HIGH (ref 70–99)
Potassium: 3.5 mmol/L (ref 3.5–5.1)
Sodium: 136 mmol/L (ref 135–145)
Total Bilirubin: 1.3 mg/dL — ABNORMAL HIGH (ref 0.0–1.2)
Total Protein: 5.8 g/dL — ABNORMAL LOW (ref 6.5–8.1)

## 2023-06-12 LAB — LACTIC ACID, PLASMA
Lactic Acid, Venous: 4 mmol/L (ref 0.5–1.9)
Lactic Acid, Venous: 1.6 mmol/L (ref 0.5–1.9)

## 2023-06-12 LAB — I-STAT CHEM 8, ED
BUN: 22 mg/dL (ref 8–23)
Calcium, Ion: 1.05 mmol/L — ABNORMAL LOW (ref 1.15–1.40)
Chloride: 103 mmol/L (ref 98–111)
Creatinine, Ser: 1.7 mg/dL — ABNORMAL HIGH (ref 0.61–1.24)
Glucose, Bld: 101 mg/dL — ABNORMAL HIGH (ref 70–99)
HCT: 34 % — ABNORMAL LOW (ref 39.0–52.0)
Hemoglobin: 11.6 g/dL — ABNORMAL LOW (ref 13.0–17.0)
Potassium: 3.7 mmol/L (ref 3.5–5.1)
Sodium: 139 mmol/L (ref 135–145)
TCO2: 22 mmol/L (ref 22–32)

## 2023-06-12 LAB — RESP PANEL BY RT-PCR (RSV, FLU A&B, COVID)  RVPGX2
Influenza A by PCR: NEGATIVE
Influenza B by PCR: NEGATIVE
Resp Syncytial Virus by PCR: NEGATIVE
SARS Coronavirus 2 by RT PCR: POSITIVE — AB

## 2023-06-12 LAB — C DIFFICILE QUICK SCREEN W PCR REFLEX
C Diff antigen: NEGATIVE
C Diff interpretation: NOT DETECTED
C Diff toxin: NEGATIVE

## 2023-06-12 LAB — CK: Total CK: 89 U/L (ref 49–397)

## 2023-06-12 LAB — I-STAT CG4 LACTIC ACID, ED: Lactic Acid, Venous: 4 mmol/L (ref 0.5–1.9)

## 2023-06-12 LAB — LIPASE, BLOOD: Lipase: 24 U/L (ref 11–51)

## 2023-06-12 MED ORDER — NIRMATRELVIR/RITONAVIR (PAXLOVID) TABLET (RENAL DOSING): 2.0000 | ORAL_TABLET | Freq: Two times a day (BID) | ORAL | Status: DC

## 2023-06-12 MED ORDER — SODIUM CHLORIDE 0.9 % IV SOLN
200.0000 mg | Freq: Once | INTRAVENOUS | Status: AC
  Administered 2023-06-12: 200 mg via INTRAVENOUS
  Filled 2023-06-12: qty 40

## 2023-06-12 MED ORDER — CALCIUM GLUCONATE-NACL 2-0.675 GM/100ML-% IV SOLN
2.0000 g | Freq: Once | INTRAVENOUS | Status: AC
  Administered 2023-06-12: 2000 mg via INTRAVENOUS
  Filled 2023-06-12 (×2): qty 100

## 2023-06-12 MED ORDER — LACTATED RINGERS IV BOLUS
1000.0000 mL | Freq: Once | INTRAVENOUS | Status: AC
  Administered 2023-06-12: 1000 mL via INTRAVENOUS

## 2023-06-12 MED ORDER — ROSUVASTATIN CALCIUM 20 MG PO TABS
20.0000 mg | ORAL_TABLET | Freq: Every day | ORAL | Status: DC
  Administered 2023-06-12 – 2023-06-14 (×3): 20 mg via ORAL
  Filled 2023-06-12 (×3): qty 1

## 2023-06-12 MED ORDER — SODIUM CHLORIDE 0.9 % IV SOLN
100.0000 mg | Freq: Every day | INTRAVENOUS | Status: DC
  Filled 2023-06-12: qty 20

## 2023-06-12 MED ORDER — ACETAMINOPHEN 650 MG RE SUPP
650.0000 mg | Freq: Four times a day (QID) | RECTAL | Status: DC | PRN
Start: 2023-06-12 — End: 2023-06-14

## 2023-06-12 MED ORDER — FINASTERIDE 5 MG PO TABS
5.0000 mg | ORAL_TABLET | Freq: Every day | ORAL | Status: DC
  Administered 2023-06-12 – 2023-06-14 (×3): 5 mg via ORAL
  Filled 2023-06-12 (×3): qty 1

## 2023-06-12 MED ORDER — APIXABAN 2.5 MG PO TABS
2.5000 mg | ORAL_TABLET | Freq: Two times a day (BID) | ORAL | Status: DC
  Administered 2023-06-12 – 2023-06-13 (×2): 2.5 mg via ORAL
  Filled 2023-06-12 (×3): qty 1

## 2023-06-12 MED ORDER — TERAZOSIN HCL 5 MG PO CAPS
10.0000 mg | ORAL_CAPSULE | Freq: Every day | ORAL | Status: DC
Start: 2023-06-12 — End: 2023-06-14
  Administered 2023-06-12 – 2023-06-13 (×2): 10 mg via ORAL
  Filled 2023-06-12 (×3): qty 2

## 2023-06-12 MED ORDER — ONDANSETRON HCL 4 MG/2ML IJ SOLN: 4.0000 mg | Freq: Four times a day (QID) | INTRAMUSCULAR | Status: DC | PRN

## 2023-06-12 MED ORDER — ENOXAPARIN SODIUM 40 MG/0.4ML IJ SOSY: 40.0000 mg | PREFILLED_SYRINGE | INTRAMUSCULAR | Status: DC

## 2023-06-12 MED ORDER — PREDNISONE 5 MG PO TABS
5.0000 mg | ORAL_TABLET | Freq: Two times a day (BID) | ORAL | Status: DC
  Administered 2023-06-12 – 2023-06-14 (×4): 5 mg via ORAL
  Filled 2023-06-12 (×4): qty 1

## 2023-06-12 MED ORDER — ACETAMINOPHEN 325 MG PO TABS: 650.0000 mg | ORAL_TABLET | Freq: Four times a day (QID) | ORAL | Status: DC | PRN

## 2023-06-12 MED ORDER — IOHEXOL 350 MG/ML SOLN
80.0000 mL | Freq: Once | INTRAVENOUS | Status: AC | PRN
  Administered 2023-06-12: 80 mL via INTRAVENOUS

## 2023-06-12 MED ORDER — SODIUM CHLORIDE 0.9% FLUSH
3.0000 mL | Freq: Two times a day (BID) | INTRAVENOUS | Status: DC
  Administered 2023-06-12 – 2023-06-14 (×5): 3 mL via INTRAVENOUS

## 2023-06-12 MED ORDER — MAGNESIUM GLUCONATE 500 MG PO TABS
500.0000 mg | ORAL_TABLET | Freq: Every day | ORAL | Status: DC
  Administered 2023-06-13 – 2023-06-14 (×2): 500 mg via ORAL
  Filled 2023-06-12 (×2): qty 1

## 2023-06-12 MED ORDER — ONDANSETRON HCL 4 MG PO TABS: 4.0000 mg | ORAL_TABLET | Freq: Four times a day (QID) | ORAL | Status: DC | PRN

## 2023-06-12 MED ORDER — SODIUM CHLORIDE 0.9 % IV SOLN: INTRAVENOUS | Status: DC

## 2023-06-12 MED ORDER — SODIUM CHLORIDE 0.9 % IV BOLUS
500.0000 mL | Freq: Once | INTRAVENOUS | Status: AC
  Administered 2023-06-12: 500 mL via INTRAVENOUS

## 2023-06-12 MED ORDER — ALBUTEROL SULFATE (2.5 MG/3ML) 0.083% IN NEBU: 2.5000 mg | INHALATION_SOLUTION | Freq: Four times a day (QID) | RESPIRATORY_TRACT | Status: DC | PRN

## 2023-06-12 NOTE — ED Provider Notes (Signed)
[place] EMERGENCY DEPARTMENT AT [facility] Provider Note   CSN: [id number] Arrival date & time: 06/12/23  0449     History  Chief Complaint  Patient presents with   [name]    DOLLIE MAYSE is a 89 y.o. male.  Patient brought in by EMS as a level 2 trauma.  Unwitnessed fall while taking Eliquis.  Patient denies any complaints.  He was found on the ground next to her recliner is unclear whether he hit his head.  Does not recall falling.  EMS reports multiple sick contacts at home with GI bug over the past several days.  He has had diarrhea, abdominal pain and distention but no vomiting.  He denies any abdominal pain currently.  Hypotensive to the 80s for EMS and tachycardic and feeling warm with diarrhea everywhere.  No blood in the diarrhea per EMS.  Patient oriented to person and place.  Denies head, neck, back, chest or abdominal pain.  No shortness of breath.  No focal weakness, numbness or tingling.  No fever.  No recent cough, URI, sore throat or runny nose.  The history is provided by the patient. The history is limited by the absence of a caregiver and the condition of the patient.  Fall Associated symptoms include abdominal pain. Pertinent negatives include no chest pain and no headaches.       Home Medications Prior to Admission medications   Medication Sig Start Date End Date Taking? Authorizing Provider  apixaban (ELIQUIS) 5 MG TABS tablet Take  1 tablet   2 x / day (every 12 hours)  for Afib to Prevent Blood Clots         /       TAKE      BY      MOUTH 05/20/23   Lucky Cowboy, MD  Cholecalciferol (VITAMIN D) 125 MCG (5000 UT) CAPS Take 5,000 Units by mouth as directed.    [provider]  finasteride (PROSCAR) 5 MG tablet Take  1 tablet  Daily  for Prostate 12/26/22   Lucky Cowboy, MD  furosemide (LASIX) 40 MG tablet TAKE 1 TABLET DAILY FOR FLUID RETENTION / EDEMA Patient taking differently: Take 40 mg by mouth every other day. Take  1 tablet   Daily  for Fluid Retention / Edema 10/09/22   Lucky Cowboy, MD  magnesium gluconate (MAGONATE) 500 MG tablet Take 500 mg by mouth daily.    [provider]  olmesartan (BENICAR) 20 MG tablet Take 0.5 tablets (10 mg total) by mouth daily. 03/21/23   Sheilah Pigeon, PA-C  OVER THE COUNTER MEDICATION 1 capsule daily. [employer] Zinc Quercitin With Vitamin C and D3 (130 mg )    [provider]  predniSONE (DELTASONE) 5 MG tablet Take 1 tablet by mouth twice a day or as directed for asthma and rheumatoid arthritis 07/07/22   Lucky Cowboy, MD  rosuvastatin (CRESTOR) 20 MG tablet Take 1 tablet by mouth daily for cholesterol 07/06/22   Raynelle Dick, NP  terazosin (HYTRIN) 10 MG capsule Take  1 capsule  at Bedtime  for Prostate 09/06/22   Lucky Cowboy, MD  terbinafine (LAMISIL) 250 MG tablet Take 1 tablet Daily for ToeNail Fungus & Athlete's Foot for 30 days straight then stop for 30 days and repeat cycle Patient taking differently: as needed. Take 1 tablet Daily for ToeNail Fungus & Athlete's Foot for 30 days straight then stop for 30 days and repeat cycle 08/08/22   Anda Kraft  E, NP  vitamin B-12 (CYANOCOBALAMIN) 1000 MCG tablet Take 1,000 mcg by mouth 3 (three) times a week.    [provider]      Allergies    Ace inhibitors, Asa [aspirin], Penicillins, Vasotec [enalapril], and Clindamycin/lincomycin    Review of Systems   Review of Systems  Constitutional:  Positive for fatigue. Negative for activity change, appetite change and fever.  HENT:  Negative for congestion.   Respiratory:  Negative for cough and stridor.   Cardiovascular:  Negative for chest pain.  Gastrointestinal:  Positive for abdominal pain, nausea and vomiting.  Genitourinary:  Negative for dysuria.  Skin:  Negative for rash.  Neurological:  Positive for weakness. Negative for dizziness and headaches.   all other systems are negative except as noted in the HPI and PMH.    Physical  Exam Updated Vital Signs BP 108/60 Comment: manual  Pulse 88   Temp 98.6 F (37 C)   Resp 20   Ht 6\' 5"  (1.956 m)   Wt 108.4 kg   SpO2 99% Comment: RA  BMI 28.34 kg/m  Physical Exam Vitals and nursing note reviewed.  Constitutional:      General: He is not in acute distress.    Appearance: He is well-developed.     Comments: Oriented to person and place  HENT:     Head: Normocephalic and atraumatic.     Mouth/Throat:     Pharynx: No oropharyngeal exudate.  Eyes:     Conjunctiva/sclera: Conjunctivae normal.     Pupils: Pupils are equal, round, and reactive to light.  Neck:     Comments: No midline C-spine tenderness Cardiovascular:     Rate and Rhythm: Normal rate and regular rhythm.     Heart sounds: Normal heart sounds. No murmur heard. Pulmonary:     Effort: Pulmonary effort is normal. No respiratory distress.     Breath sounds: Normal breath sounds.  Abdominal:     General: There is distension.     Palpations: Abdomen is soft.     Tenderness: There is no abdominal tenderness. There is no guarding or rebound.     Comments: Distended, nontender  Musculoskeletal:        General: No tenderness. Normal range of motion.     Cervical back: Normal range of motion and neck supple.     Comments: No T or L-spine tenderness Full range of motion hips bilaterally without pain  Skin:    General: Skin is warm.  Neurological:     Mental Status: He is alert.     Cranial Nerves: No cranial nerve deficit.     Motor: No abnormal muscle tone.     Coordination: Coordination normal.     Comments: No ataxia on finger to nose bilaterally. No pronator drift. 5/5 strength throughout. CN 2-12 intact.Equal grip strength. Sensation intact.   Psychiatric:        Behavior: Behavior normal.     ED Results / Procedures / Treatments   Labs (all labs ordered are listed, but only abnormal results are displayed) Labs Reviewed  RESP PANEL BY RT-PCR (RSV, FLU A&B, COVID)  RVPGX2 - Abnormal;  Notable for the following components:      Result Value   SARS Coronavirus 2 by RT PCR POSITIVE (*)    All other components within normal limits  CBC WITH DIFFERENTIAL/PLATELET - Abnormal; Notable for the following components:   RBC 3.88 (*)    Hemoglobin 12.1 (*)    HCT 36.5 (*)  Neutro Abs 8.1 (*)    Monocytes Absolute 1.1 (*)    Abs Immature Granulocytes 0.08 (*)    All other components within normal limits  COMPREHENSIVE METABOLIC PANEL - Abnormal; Notable for the following components:   CO2 20 (*)    Glucose, Bld 105 (*)    Creatinine, Ser 1.73 (*)    Calcium 7.9 (*)    Total Protein 5.8 (*)    Albumin 2.9 (*)    Total Bilirubin 1.3 (*)    GFR, Estimated 37 (*)    All other components within normal limits  LACTIC ACID, PLASMA - Abnormal; Notable for the following components:   Lactic Acid, Venous 4.0 (*)    All other components within normal limits  I-STAT CHEM 8, ED - Abnormal; Notable for the following components:   Creatinine, Ser 1.70 (*)    Glucose, Bld 101 (*)    Calcium, Ion 1.05 (*)    Hemoglobin 11.6 (*)    HCT 34.0 (*)    All other components within normal limits  I-STAT CG4 LACTIC ACID, ED - Abnormal; Notable for the following components:   Lactic Acid, Venous 4.0 (*)    All other components within normal limits  CULTURE, BLOOD (ROUTINE X 2)  CULTURE, BLOOD (ROUTINE X 2)  LIPASE, BLOOD  LACTIC ACID, PLASMA  URINALYSIS, ROUTINE W REFLEX MICROSCOPIC  TROPONIN I (HIGH SENSITIVITY)  TROPONIN I (HIGH SENSITIVITY)    EKG EKG Interpretation Date/Time:  Tuesday June 12 2023 06:05:16 EST Ventricular Rate:  81 PR Interval:  192 QRS Duration:  125 QT Interval:  365 QTC Calculation: 424 R Axis:   88  Text Interpretation: Sinus arrhythmia Nonspecific intraventricular conduction delay Anteroseptal infarct, old Borderline repolarization abnormality No significant change was found Confirmed by Glynn Octave [phone]) on 06/12/2023 6:11:05 AM  Radiology CT  ABDOMEN PELVIS W CONTRAST Result Date: 06/12/2023 CLINICAL DATA:  Abdominal trauma, blunt. Unwitnessed fall. Several days of diarrhea and abdominal pain with distension. EXAM: CT ABDOMEN AND PELVIS WITH CONTRAST TECHNIQUE: Multidetector CT imaging of the abdomen and pelvis was performed using the standard protocol following bolus administration of intravenous contrast. RADIATION DOSE REDUCTION: This exam was performed according to the departmental dose-optimization program which includes automated exposure control, adjustment of the mA and/or kV according to patient size and/or use of iterative reconstruction technique. CONTRAST:  80mL OMNIPAQUE IOHEXOL 350 MG/ML SOLN COMPARISON:  10/08/2009 FINDINGS: Lower chest: Pacer leads in the right ventricle and right atrium. Reticulation of lung markings at the bases is likely scar. Hepatobiliary: Cluster of cysts in the left lobe liver, some showing interval collapse since 2011, maximal size of 2.4 cm.No suspicious liver finding. No gallstone or biliary dilatation. Pancreas: Unremarkable. Spleen: Unremarkable. Adrenals/Urinary Tract: Negative adrenals. No hydronephrosis or stone. Thick walled bladder likely related to chronic outlet obstruction. No bladder over distension currently. Stomach/Bowel: Distal colonic fluid levels. No bowel wall thickening or obstruction. Vascular/Lymphatic: No acute vascular abnormality. Scattered atheromatous calcification. No mass or adenopathy. Reproductive:Enlarged prostate up lifting the bladder base. Other: No ascites or pneumoperitoneum. Musculoskeletal: Lumbar spine degeneration with L3-4 solid arthrodesis. No acute or aggressive finding. IMPRESSION: 1. Distal colonic fluid levels compatible with diarrheal illness. No bowel wall thickening. 2. History of fall.  No traumatic finding. Electronically Signed   By: Tiburcio Pea M.D.   On: 06/12/2023 05:50   CT Head Wo Contrast Result Date: 06/12/2023 CLINICAL DATA:  Head trauma. Sick  with nausea, vomiting, and diarrhea. EXAM: CT HEAD WITHOUT CONTRAST CT CERVICAL  SPINE WITHOUT CONTRAST TECHNIQUE: Multidetector CT imaging of the head and cervical spine was performed following the standard protocol without intravenous contrast. Multiplanar CT image reconstructions of the cervical spine were also generated. RADIATION DOSE REDUCTION: This exam was performed according to the departmental dose-optimization program which includes automated exposure control, adjustment of the mA and/or kV according to patient size and/or use of iterative reconstruction technique. COMPARISON:  07/02/2021 FINDINGS: CT HEAD FINDINGS Brain: No evidence of acute infarction, hemorrhage, hydrocephalus, extra-axial collection or mass lesion/mass effect. Generalized cortical volume loss in keeping with age. Vascular: No hyperdense vessel or unexpected calcification. Skull: Normal. Negative for fracture or focal lesion. Sinuses/Orbits: No acute finding. CT CERVICAL SPINE FINDINGS Alignment: Normal. Skull base and vertebrae: No acute fracture. No primary bone lesion or focal pathologic process. Soft tissues and spinal canal: No prevertebral fluid or swelling. No visible canal hematoma. Disc levels:  Ordinary cervical spine degeneration. Upper chest: No evidence of injury IMPRESSION: No evidence of acute intracranial or cervical spine injury. Electronically Signed   By: Tiburcio Pea M.D.   On: 06/12/2023 05:31   CT Cervical Spine Wo Contrast Result Date: 06/12/2023 CLINICAL DATA:  Head trauma. Sick with nausea, vomiting, and diarrhea. EXAM: CT HEAD WITHOUT CONTRAST CT CERVICAL SPINE WITHOUT CONTRAST TECHNIQUE: Multidetector CT imaging of the head and cervical spine was performed following the standard protocol without intravenous contrast. Multiplanar CT image reconstructions of the cervical spine were also generated. RADIATION DOSE REDUCTION: This exam was performed according to the departmental dose-optimization program  which includes automated exposure control, adjustment of the mA and/or kV according to patient size and/or use of iterative reconstruction technique. COMPARISON:  07/02/2021 FINDINGS: CT HEAD FINDINGS Brain: No evidence of acute infarction, hemorrhage, hydrocephalus, extra-axial collection or mass lesion/mass effect. Generalized cortical volume loss in keeping with age. Vascular: No hyperdense vessel or unexpected calcification. Skull: Normal. Negative for fracture or focal lesion. Sinuses/Orbits: No acute finding. CT CERVICAL SPINE FINDINGS Alignment: Normal. Skull base and vertebrae: No acute fracture. No primary bone lesion or focal pathologic process. Soft tissues and spinal canal: No prevertebral fluid or swelling. No visible canal hematoma. Disc levels:  Ordinary cervical spine degeneration. Upper chest: No evidence of injury IMPRESSION: No evidence of acute intracranial or cervical spine injury. Electronically Signed   By: Tiburcio Pea M.D.   On: 06/12/2023 05:31   DG Chest Port 1 View Result Date: 06/12/2023 CLINICAL DATA:  89 year old male with history of trauma from a fall. EXAM: PORTABLE CHEST 1 VIEW COMPARISON:  Chest x-ray 08/16/2021. FINDINGS: Lung volumes are low. Mild elevation of the right hemidiaphragm. No acute consolidative airspace disease. No pleural effusions. No pneumothorax. No evidence of pulmonary edema. Heart size is normal. Upper mediastinal contours are within normal limits. Left-sided pacemaker device in place with lead tips projecting over the expected location of the right atrium and right ventricle. Atherosclerotic calcifications are noted in the thoracic aorta. IMPRESSION: 1. Low lung volumes without radiographic evidence of acute cardiopulmonary disease. 2. Aortic atherosclerosis. Electronically Signed   By: Trudie Reed M.D.   On: 06/12/2023 05:31    Procedures Procedures    Medications Ordered in ED Medications - No data to display  ED Course/ Medical  Decision Making/ A&P                                 Medical Decision Making Amount and/or Complexity of Data Reviewed Independent Historian: EMS Labs:  ordered. Decision-making details documented in ED Course. Radiology: ordered and independent interpretation performed. Decision-making details documented in ED Course. ECG/medicine tests: ordered and independent interpretation performed. Decision-making details documented in ED Course.  Risk Prescription drug management.   Unwitnessed fall with possible head injury while taking blood thinner.  Does not recall falling.  GCS is 14, ABCs are intact.  Oriented to person and place.  Vitals are stable on arrival.  Denies abdominal pain.  Concern for likely viral illness causing his diarrhea and possible fever.  Will check labs, urinalysis, EKG, chest x-ray.  CT head and C-spine given possible head injury on Eliquis.  Patient IV fluids.  EKG is unchanged sinus arrhythmia.  No acute ST changes.  No Brugada, no prolonged QT.  Labs show elevated creatinine 1.7 from 1.3.  Will hydrate.  Lactate is 4.0.  No fever.  Traumatic workup is reassuring.  CT head and C-spine are negative.  CT abdomen pelvis without acute findings but does show evidence of diarrheal illness.  Results reviewed interpreted by me.  IV fluids to be judiciously given. Attempt p.o. challenge.  Patient will need to reach assessed after additional fluid to check his lactate and troponin to ensure these are downtrending.  Suspect likely diarrheal illness causing his lactic acidosis and weakness and possible syncope.  COVID test was positive but no hypoxia or increased work of breathing.  Chest x-ray is negative  Care to Be transferred at shift change pending repeat lactic acid level and troponin. Dr. Jeraldine Loots to assume care.       Final Clinical Impression(s) / ED Diagnoses Final diagnoses:  Fall, initial encounter  Lactic acidosis    Rx / DC Orders ED Discharge Orders      None         Selma Mink, Jeannett Senior, MD 06/12/23 [phone]

## 2023-06-12 NOTE — H&P (Signed)
History and Physical    Patient: [name] UJW:[id number] DOB: May 07, 1931 DOA: 06/12/2023 DOS: the patient was seen and examined on 06/12/2023 PCP: Lucky Cowboy, MD  Patient coming from: Home via EMS  Chief Complaint:  Chief Complaint  Patient presents with   Fall   HPI: [name] is a 89 y.o. male with medical history significant of hypertension, paroxysmal atrial fibrillation on chronic anticoagulation, PPM, COPD, BPH, RA presents after having a fall while going to the bathroom this morning.  History is obtained from the patient with assistance of his family.  He has had multiple episodes of diarrhea for which he had been incontinent of his bowels.  Denied having any significant abdominal pain, nausea, vomiting, or cough symptoms. Patient had gone to a family gathering 3 days ago, and other family members poorly have had similar symptoms.  This morning family noticed that patient was significantly confused and normally is alert and oriented x 3.  He had just recently followed up at cardiology and family notes that his blood pressures have been low for which they had family questions if patient has norovirus and request his stool to be tested.  With EMS patient was noted to be hypotensive with initial blood pressure 98/38.  Patient presented to the ED as a level 2 trauma.  Patient was noted to be afebrile with blood pressures noted to be as low as 90/47.  Labs significant for WBC 10.2, hemoglobin 12.1, BUN 20, creatinine 1.73, glucose 105, lipase 24, high-sensitivity troponin 17->18, lactic acid 4->1.6.  Chest x-ray had noted low lung volumes without acute pulmonary disease.  COVID-19 screening was positive.  CT scan of the head and cervical spine did not note any acute abnormality.  CT scan of the abdomen and pelvis noted distal colonic fluid levels compatible with diarrheal illness.  Blood cultures have been obtained.   Patient had been bolused 2.5 L normal saline IV fluids and given  one-time dose of remdesivir.   Review of Systems: As mentioned in the history of present illness. All other systems reviewed and are negative. Past Medical History:  Diagnosis Date   Allergy    Asthma    BPH (benign prostatic hypertrophy)    BPH (benign prostatic hypertrophy)    COPD (chronic obstructive pulmonary disease) (HCC)    DJD (degenerative joint disease)    GERD (gastroesophageal reflux disease)    History of urinary retention    Hypertension    IBS (irritable bowel syndrome)    RBBB    Rheumatoid arthritis(714.0)    DR. HAWKES   Scrotal lesion    Short of breath on exertion    Past Surgical History:  Procedure Laterality Date   CARDIOVASCULAR STRESS TEST  04-26-2011  DR BRACKBILL   LOW RISK STRESS NUCLEAR STUDY/ NO EVIDENCE ISCHEMIA   LOOP RECORDER INSERTION N/A 03/07/2021   Procedure: LOOP RECORDER INSERTION;  Surgeon: Lanier Prude, MD;  Location: [facility];  Service: Cardiovascular;  Laterality: N/A;   LOOP RECORDER REMOVAL N/A 07/03/2021   Procedure: LOOP RECORDER REMOVAL;  Surgeon: Marinus Maw, MD;  Location: [facility];  Service: Cardiovascular;  Laterality: N/A;   LUMBAR FUSION  X3  LAST ONE 1996   PACEMAKER IMPLANT N/A 07/03/2021   Procedure: PACEMAKER IMPLANT;  Surgeon: Marinus Maw, MD;  Location: [facility];  Service: Cardiovascular;  Laterality: N/A;   SCROTAL EXPLORATION Left 07/17/2012   Procedure:  EXCISION  OF LEFT SCROTAL SKIN  LESION;  Surgeon: Milford Cage, MD;  Location[facility];  Service: Urology;  Laterality: Left;   TRANSTHORACIC ECHOCARDIOGRAM  04-26-2011     MILD LVH/ LVSF NORMAL/ EF 60-65%/ GRADE I DIASTOLIC DYSFUNCTION/ MILD MITRAL REGURG.   TRANSURETHRAL RESECTION OF PROSTATE  SEVERAL YRS AGO   Social History:  reports that he quit smoking about 42 years ago. His smoking use included cigarettes. He started smoking about 72 years ago. He has a 30 pack-year smoking history. His  smokeless tobacco use includes chew. He reports that he does not drink alcohol and does not use drugs.  Allergies  Allergen Reactions   Ace Inhibitors    Asa [Aspirin]     High Dose asprin   Penicillins Hives   Vasotec [Enalapril]     Cough   Clindamycin/Lincomycin     Hives     Family History  Problem Relation Age of Onset   Heart disease Father    Heart disease Brother    Colon cancer Brother 40    Prior to Admission medications   Medication Sig Start Date End Date Taking? Authorizing Provider  apixaban (ELIQUIS) 5 MG TABS tablet Take  1 tablet   2 x / day (every 12 hours)  for Afib to Prevent Blood Clots         /       TAKE      BY      MOUTH 05/20/23   Lucky Cowboy, MD  Cholecalciferol (VITAMIN D) 125 MCG (5000 UT) CAPS Take 5,000 Units by mouth as directed.    [provider]  finasteride (PROSCAR) 5 MG tablet Take  1 tablet  Daily  for Prostate 12/26/22   Lucky Cowboy, MD  furosemide (LASIX) 40 MG tablet TAKE 1 TABLET DAILY FOR FLUID RETENTION / EDEMA Patient taking differently: Take 40 mg by mouth every other day. Take  1 tablet  Daily  for Fluid Retention / Edema 10/09/22   Lucky Cowboy, MD  magnesium gluconate (MAGONATE) 500 MG tablet Take 500 mg by mouth daily.    [provider]  olmesartan (BENICAR) 20 MG tablet Take 0.5 tablets (10 mg total) by mouth daily. 03/21/23   Sheilah Pigeon, PA-C  OVER THE COUNTER MEDICATION 1 capsule daily. Naturebell Zinc Quercitin With Vitamin C and D3 (130 mg )    [provider]  predniSONE (DELTASONE) 5 MG tablet Take 1 tablet by mouth twice a day or as directed for asthma and rheumatoid arthritis 07/07/22   Lucky Cowboy, MD  rosuvastatin (CRESTOR) 20 MG tablet Take 1 tablet by mouth daily for cholesterol 07/06/22   Raynelle Dick, NP  terazosin (HYTRIN) 10 MG capsule Take  1 capsule  at Bedtime  for Prostate 09/06/22   Lucky Cowboy, MD  terbinafine (LAMISIL) 250 MG tablet Take 1 tablet Daily  for ToeNail Fungus & Athlete's Foot for 30 days straight then stop for 30 days and repeat cycle Patient taking differently: as needed. Take 1 tablet Daily for ToeNail Fungus & Athlete's Foot for 30 days straight then stop for 30 days and repeat cycle 08/08/22   Raynelle Dick, NP  vitamin B-12 (CYANOCOBALAMIN) 1000 MCG tablet Take 1,000 mcg by mouth 3 (three) times a week.    [provider]    Physical Exam: Vitals:   06/12/23 0830 06/12/23 0859 06/12/23 0900 06/12/23 1000  BP: (!) 114/59  (!) 90/47 123/60  Pulse: 89  80 76  Resp: 12  18 16  Temp:  98.1 F (36.7 C)    TempSrc:  Oral    SpO2: 97%  97% 97%  Weight:      Height:        Constitutional: Elderly male who appears to be in no acute distress Eyes: PERRL, lids and conjunctivae normal ENMT: Mucous membranes are moist.  Hard of hearing despite hearing aids currently in place. Neck: normal, supple  Respiratory: clear to auscultation bilaterally, no wheezing, no crackles. Normal respiratory effort. No accessory muscle use.  Cardiovascular: Regular rate and rhythm, no murmurs / rubs / gallops. No extremity edema. 2+ pedal pulses.   Abdomen: no tenderness, no masses palpated.  Bowel sounds positive.  Musculoskeletal: no clubbing / cyanosis. No joint deformity upper and lower extremities. Good ROM, no contractures. Normal muscle tone.  Skin: no rashes, lesions, ulcers. No induration Neurologic: CN 2-12 grossly intact.  Patient able to move all extremities at this time. Psychiatric: Normal judgment and insight. Alert and oriented x 3. Normal mood.   Data Reviewed:  Sinus arrhythmia at 81 bpm.  Reviewed labs, imaging, and pertinent records as documented.  Assessment and Plan:  Diarrhea secondary to COVID-19 Patient presents with acute onset of diarrhea after recent family gathering 3 days ago.  Denies having any significant cough, nausea, vomiting or pain.  Chest x-ray noted low lung volumes.  CT scan of the abdomen  pelvis noted a diarrheal illness without bowel wall thickening.  Patient was found to be positive for COVID-19.  Suspect symptoms related.  He had received 1 dose of remdesivir IV.  After discussing with patient's family that he not be continued on the remdesivir. -Admit to a medical telemetry bed. -Strict intake and output -Check GI panel and C. Difficile -Discontinue remdesivir -Gentle IV fluids at 50 mL/hr  Lactic acidosis Resolved.  Initial lactic acid noted to be elevated at 4.  Suspect secondary to significant dehydration due to reports of diarrhea.  Boluses repeat check was within normal limits.  Transient hypotension On admission in the emergency department patient was noted blood pressure was low as 90/47.  Patient had been bolused fluids with improvement blood pressures.  Family made note that he had medications recently.   -Hold home blood pressure regimen.  Resume home regimen when medically appropriate. -Goal MAP greater than 65 -Consider need of IV stress dosing if hypotension persist  Fall at home Patient was reported to have an unwitnessed fall at home prior to arrival.  Possibly secondary to him passing out.  CT scan of the head and cervical spine obtained which noted no acute intracranial or cervical abnormality. -Follow-up telemetry -Up with assistance -PT to evaluate and treat  Acute kidney injury superimposed on chronic kidney disease stage IIIb Patient presents with creatinine elevated up to 1.73 with BUN 20.  When he previously had been around 1.24 when last checked on 05/15/2023.  Patient had been bolused at least 2.5 L of IV fluids. -Follow-up urinalysis -Fluids as noted above  Paroxysmal atrial fibrillation on chronic anticoagulation S/p PPM Patient appears to be in a sinus rhythm at this time. CHA2DS2-VASc score equal 4. -Continue Eliquis with dose adjusted due to kidney function  Normocytic anemia Hemoglobin 12.1 which appears around patient's  baseline. -Recheck CBC tomorrow  Rheumatoid arthritis Patient appears to be on chronic steroids. -Continue prednisone  Mixed hyperlipidemia -Continue Crestor  BPH -Continue Proscar and terazosin    DVT prophylaxis: Eliquis  advance Care Planning:   Code Status: Full Code  Consults: None  Family Communication: Daughter updated at bedside  Severity of Illness: The appropriate patient status for this patient is INPATIENT. Inpatient status is judged to be reasonable and necessary in order to provide the required intensity of service to ensure the patient's safety. The patient's presenting symptoms, physical exam findings, and initial radiographic and laboratory data in the context of their chronic comorbidities is felt to place them at high risk for further clinical deterioration. Furthermore, it is not anticipated that the patient will be medically stable for discharge from the hospital within 2 midnights of admission.   * I certify that at the point of admission it is my clinical judgment that the patient will require inpatient hospital care spanning beyond 2 midnights from the point of admission due to high intensity of service, high risk for further deterioration and high frequency of surveillance required.*  Author: Clydie Braun, MD 06/12/2023 10:36 AM  For on call review [email or web address].

## 2023-06-12 NOTE — TOC CAGE-AID Note (Signed)
Transition of Care [facility]) - CAGE-AID Screening   Patient Details  Name: [name] MRN: [id number] Date of Birth: January 13, 1931  Transition of Care [facility]) CM/SW Contact:    Janora Norlander, RN  Phone Number: [phone] 06/12/2023, 7:30 PM   Clinical Narrative: Pt in the [facility] ED after having a fall while going to the bathroom this morning.  Pt is on blood thinners.  Pt denies alcohol or drug use.  Screening complete.    CAGE-AID Screening:    Have You Ever Felt You Ought to Cut Down on Your Drinking or Drug Use?: No Have People Annoyed You By Critizing Your Drinking Or Drug Use?: No Have You Felt Bad Or Guilty About Your Drinking Or Drug Use?: No Have You Ever Had a Drink or Used Drugs First Thing In The Morning to Steady Your Nerves or to Get Rid of a Hangover?: No CAGE-AID Score: 0  Substance Abuse Education Offered: No

## 2023-06-12 NOTE — ED Provider Notes (Signed)
Care assumed at signout.  On this exam the patient remains hypotensive, but awake and alert.  With concern for renal injury, likely secondary to dehydration, possible from COVID plus minus diarrhea patient will be admitted for further monitoring, management, fluids.  C. difficile panel sent given his loose stool, though the patient has no current abdominal pain, and is afebrile.   Gerhard Munch, MD 06/12/23 1001

## 2023-06-12 NOTE — ED Triage Notes (Signed)
EMS reports entire family has been sick with virus and he has had n/v/d x 2days.  Patient on blood thinner and had fall unwitnessed.  Per ems patient was hypotensive tachy and hot to touch and there was diarrhea everywhere.  Patient has no complaints but does not recall event of fall.

## 2023-06-13 DIAGNOSIS — D649 Anemia, unspecified: Secondary | ICD-10-CM | POA: Diagnosis not present

## 2023-06-13 DIAGNOSIS — N179 Acute kidney failure, unspecified: Secondary | ICD-10-CM | POA: Diagnosis not present

## 2023-06-13 DIAGNOSIS — I480 Paroxysmal atrial fibrillation: Secondary | ICD-10-CM | POA: Diagnosis not present

## 2023-06-13 DIAGNOSIS — A0811 Acute gastroenteropathy due to Norwalk agent: Principal | ICD-10-CM

## 2023-06-13 DIAGNOSIS — U071 COVID-19: Secondary | ICD-10-CM | POA: Diagnosis not present

## 2023-06-13 LAB — URINALYSIS, ROUTINE W REFLEX MICROSCOPIC
Bilirubin Urine: NEGATIVE
Glucose, UA: NEGATIVE mg/dL
Ketones, ur: NEGATIVE mg/dL
Nitrite: NEGATIVE
Protein, ur: NEGATIVE mg/dL
RBC / HPF: >50 RBC/hpf (ref 0–5)
Specific Gravity, Urine: 1.014 (ref 1.005–1.030)
pH: 5 (ref 5.0–8.0)

## 2023-06-13 LAB — GASTROINTESTINAL PANEL BY PCR, STOOL (REPLACES STOOL CULTURE)

## 2023-06-13 LAB — BASIC METABOLIC PANEL
Anion gap: 6 (ref 5–15)
BUN: 20 mg/dL (ref 8–23)
CO2: 23 mmol/L (ref 22–32)
Calcium: 7.7 mg/dL — ABNORMAL LOW (ref 8.9–10.3)
Chloride: 107 mmol/L (ref 98–111)
Creatinine, Ser: 1.15 mg/dL (ref 0.61–1.24)
GFR, Estimated: >60 mL/min (ref >60)
Glucose, Bld: 86 mg/dL (ref 70–99)
Potassium: 3.8 mmol/L (ref 3.5–5.1)
Sodium: 136 mmol/L (ref 135–145)

## 2023-06-13 LAB — CBC
HCT: 31.8 % — ABNORMAL LOW (ref 39.0–52.0)
Hemoglobin: 11 g/dL — ABNORMAL LOW (ref 13.0–17.0)
MCH: 31.5 pg (ref 26.0–34.0)
MCHC: 34.6 g/dL (ref 30.0–36.0)
MCV: 91.1 fL (ref 80.0–100.0)
Platelets: 139 10*3/uL — ABNORMAL LOW (ref 150–400)
RBC: 3.49 MIL/uL — ABNORMAL LOW (ref 4.22–5.81)
RDW: 13.3 % (ref 11.5–15.5)
WBC: 7.5 10*3/uL (ref 4.0–10.5)
nRBC: 0 % (ref 0.0–0.2)

## 2023-06-13 LAB — MAGNESIUM: Magnesium: 1.9 mg/dL (ref 1.7–2.4)

## 2023-06-13 MED ORDER — APIXABAN 5 MG PO TABS
5.0000 mg | ORAL_TABLET | Freq: Two times a day (BID) | ORAL | Status: DC
  Administered 2023-06-13 – 2023-06-14 (×2): 5 mg via ORAL
  Filled 2023-06-13 (×2): qty 1

## 2023-06-13 MED ORDER — LOPERAMIDE HCL 2 MG PO CAPS: 4.0000 mg | ORAL_CAPSULE | Freq: Three times a day (TID) | ORAL | Status: DC | PRN

## 2023-06-13 NOTE — Evaluation (Signed)
Physical Therapy Evaluation Patient Details Name: [name] MRN: [id number] DOB: [birth year] Today's Date: 06/13/2023  History of Present Illness  89 y.o. male admitted 1/28 who presented after having a fall while going to the bathroom.  Patient has had episodes of diarrhea as well.  He was found to be positive for COVID-19.  PMH: hypertension, paroxysmal atrial fibrillation on chronic anticoagulation, PPM, COPD, BPH, RA  Clinical Impression  Pt admitted with above diagnosis. Pt was able to ambulate with RW in room with good stability.  VSS and no LOB.  Did not need O2 with activity. Pt did need some cues for safety with RW in uncontrolled environment.  Daughter aware that PT recommends pt have someone at home with him iniially and use RW initially.  HHPT f/u when able to arrange (due to COVID).  Will follow acutely.  Pt currently with functional limitations due to the deficits listed below (see PT Problem List). Pt will benefit from acute skilled PT to increase their independence and safety with mobility to allow discharge.           If plan is discharge home, recommend the following: A little help with walking and/or transfers;A little help with bathing/dressing/bathroom;Assistance with cooking/housework;Assist for transportation;Help with stairs or ramp for entrance   Can travel by private vehicle        Equipment Recommendations Other (comment) (May need a tall RW as equipment above was wife's equipment. Daughter to go home and check height of RW.)  Recommendations for Other Services       Functional Status Assessment Patient has had a recent decline in their functional status and demonstrates the ability to make significant improvements in function in a reasonable and predictable amount of time.     Precautions / Restrictions Precautions Precautions: Fall Precaution Comments: DROPLET and Enteric precautions Restrictions Weight Bearing Restrictions Per Provider Order: No       Mobility  Bed Mobility Overal bed mobility: Independent                  Transfers Overall transfer level: Needs assistance Equipment used: Rolling walker (2 wheels) Transfers: Sit to/from Stand Sit to Stand: Contact guard assist           General transfer comment: No assist needed but did cue for hand placement.    Ambulation/Gait Ambulation/Gait assistance: Contact guard assist Gait Distance (Feet): 65 Feet Assistive device: Rolling walker (2 wheels) Gait Pattern/deviations: WFL(Within Functional Limits)   Gait velocity interpretation: <1.8 ft/sec, indicate of risk for recurrent falls   General Gait Details: Pt was able to ambulate with RW with CGA and no LOB.  VSS throughout.  Stairs            Wheelchair Mobility     Tilt Bed    Modified Rankin (Stroke Patients Only)       Balance Overall balance assessment: Needs assistance Sitting-balance support: No upper extremity supported, Feet supported Sitting balance-Leahy Scale: Fair     Standing balance support: Bilateral upper extremity supported, During functional activity, Reliant on assistive device for balance Standing balance-Leahy Scale: Poor                               Pertinent Vitals/Pain Pain Assessment Pain Assessment: No/denies pain    Home Living Family/patient expects to be discharged to:: Private residence Living Arrangements: Alone Available Help at Discharge: Family;Available PRN/intermittently (daughters assist prn normally, and can provide  more care when needed) Type of Home: House Home Access: Ramped entrance       Home Layout: One level Home Equipment: Agricultural consultant (2 wheels);BSC/3in1;Shower seat;Wheelchair Financial trader (4 wheels);Grab bars - tub/shower;Hand held shower head;Lift chair (walking stick, bed mechanical)      Prior Function Prior Level of Function : Independent/Modified Independent;Driving             Mobility Comments: no  device used PTA ADLs Comments: B/D self, daughters provide meals, someone cleans and does yardwork, daughters do laundry     Extremity/Trunk Assessment   Upper Extremity Assessment Upper Extremity Assessment: Defer to OT evaluation    Lower Extremity Assessment Lower Extremity Assessment: Generalized weakness    Cervical / Trunk Assessment Cervical / Trunk Assessment: Normal  Communication   Communication Communication: No apparent difficulties  Cognition Arousal: Alert Behavior During Therapy: WFL for tasks assessed/performed Overall Cognitive Status: Within Functional Limits for tasks assessed                                          General Comments General comments (skin integrity, edema, etc.): 61 bpm, 96% RA, 115/57 supine, sitting 90 bpm 108/59, stand 108/57, 97 bpm; 110/56 standing 3 min, 87 bpm;  post 77 bpm, 97% RA, 132/54    Exercises General Exercises - Lower Extremity Ankle Circles/Pumps: AROM, Both, 10 reps, Seated Long Arc Quad: AROM, Both, 10 reps, Seated Hip Flexion/Marching: AROM, Both, 10 reps, Seated   Assessment/Plan    PT Assessment Patient needs continued PT services  PT Problem List Decreased activity tolerance;Decreased balance;Decreased mobility;Decreased knowledge of use of DME;Decreased safety awareness;Decreased knowledge of precautions;Cardiopulmonary status limiting activity       PT Treatment Interventions DME instruction;Gait training;Functional mobility training;Therapeutic activities;Therapeutic exercise;Balance training;Patient/family education    PT Goals (Current goals can be found in the Care Plan section)  Acute Rehab PT Goals Patient Stated Goal: to go home PT Goal Formulation: With patient Time For Goal Achievement: 06/27/23 Potential to Achieve Goals: Good    Frequency Min 1X/week     Co-evaluation               AM-PAC PT "6 Clicks" Mobility  Outcome Measure Help needed turning from your back  to your side while in a flat bed without using bedrails?: A Little Help needed moving from lying on your back to sitting on the side of a flat bed without using bedrails?: A Little Help needed moving to and from a bed to a chair (including a wheelchair)?: A Little Help needed standing up from a chair using your arms (e.g., wheelchair or bedside chair)?: A Little Help needed to walk in hospital room?: A Little Help needed climbing 3-5 steps with a railing? : A Little 6 Click Score: 18    End of Session Equipment Utilized During Treatment: Gait belt Activity Tolerance: Patient limited by fatigue Patient left: in chair;with call bell/phone within reach;with chair alarm set;with family/visitor present Nurse Communication: Mobility status PT Visit Diagnosis: Other abnormalities of gait and mobility (R26.89)    Time: 1115-1150 PT Time Calculation (min) (ACUTE ONLY): 35 min   Charges:   PT Evaluation $PT Eval Moderate Complexity: 1 Mod PT Treatments $Gait Training: 8-22 mins PT General Charges $$ ACUTE PT VISIT: 1 Visit         Khyson Sebesta M,PT Acute Rehab Services [phone]   Bevelyn Buckles  06/13/2023, 2:20 PM

## 2023-06-13 NOTE — Progress Notes (Addendum)
[facility] PROGRESS NOTE   [name] WJX:[id number] DOB: [birth year] DOA: 06/12/2023  PCP: Lucky Cowboy, MD  Brief History: 89 y.o. male with medical history significant of hypertension, paroxysmal atrial fibrillation on chronic anticoagulation, PPM, COPD, BPH, RA presented after having a fall while going to the bathroom this morning.  Patient has had episodes of diarrhea as well.  He was found to be positive for COVID-19.  He was hospitalized for further management.   Consultants: None  Procedures: None    Subjective/Interval History: Patient mentioned that he is feeling better.  His daughter is at the bedside.  Patient does not report significant diarrhea other daughter feels that he has had a few episodes overnight.  Denies any nausea.  No abdominal pain.  Occasional dry cough but no shortness of breath.  No chest pain.    Assessment/Plan:  Acute diarrhea secondary to norovirus Initially thought to be secondary to COVID-19.  C. difficile is negative.   GI pathogen panel is positive for norovirus which is the likely reason for his diarrhea.   Abdomen is benign.  CT of the abdomen pelvis was reassuring. Continue supportive care.  COVID-19 infection Has only very minimal respiratory symptoms.  No acute findings on chest x-ray.  He is saturating normal on room air. He was given a dose of Remdesivir in the ED.  No clear indication to continue the same at this time.  Lactic acidosis Resolved.  Transient hypotension/near syncope Likely due to hypovolemia from acute diarrhea.  Blood pressures have stabilized.  Holding his antihypertensives.  PT and OT evaluation.  Acute kidney injury Creatinine was noted to be 1.73.  Improved 1.15 this morning.  Hematuria Daughter mentioned that patient passed some blood in the urine.  Urine at bedside noted to be clear.  No gross hematuria noted.  Penile examination was unremarkable.  Will check another UA.  Abdomen is benign.   No concerning findings in the GU tract on the recently done CT scan.  Paroxysmal atrial fibrillation status post pacemaker He is chronically anticoagulated with Eliquis which is being continued.  Normocytic anemia Mild drop in hemoglobin is likely dilutional.  Continue to monitor.  History of rheumatoid arthritis on chronic steroids Continue prednisone.  No clear indication for stress dosing at this time.  Hyperlipidemia Continue Crestor.  History of BPH Continue Proscar and terazosin.  DVT Prophylaxis: On Eliquis Code Status: Full code Family Communication: Discussed with patient and his daughters Disposition Plan: PT and OT eval.  Lives alone.  Status is: Inpatient Remains inpatient appropriate because: Acute diarrhea secondary to norovirus      Medications: Scheduled:  apixaban  2.5 mg Oral BID   finasteride  5 mg Oral Daily   magnesium gluconate  500 mg Oral Daily   predniSONE  5 mg Oral BID WC   rosuvastatin  20 mg Oral Daily   sodium chloride flush  3 mL Intravenous Q12H   terazosin  10 mg Oral QHS   Continuous:  sodium chloride 50 mL/hr at 06/12/23 1728   OZH:YQMVHQIONGEXB **OR** acetaminophen, albuterol, ondansetron **OR** ondansetron (ZOFRAN) IV  Antibiotics: Anti-infectives (From admission, onward)    Start     Dose/Rate Route Frequency Ordered Stop   06/13/23 1000  remdesivir 100 mg in sodium chloride 0.9 % 100 mL IVPB  Status:  Discontinued       Placed in "Followed by" Linked Group   100 mg 200 mL/hr over 30 Minutes Intravenous Daily 06/12/23 1015 06/13/23 0944  06/12/23 1030  remdesivir 200 mg in sodium chloride 0.9% 250 mL IVPB       Placed in "Followed by" Linked Group   200 mg 580 mL/hr over 30 Minutes Intravenous Once 06/12/23 1015 06/12/23 1946   06/12/23 1000  nirmatrelvir/ritonavir (renal dosing) (PAXLOVID) 2 tablet  Status:  Discontinued        2 tablet Oral 2 times daily 06/12/23 0959 06/12/23 1014       Objective:  Vital  Signs  Vitals:   06/12/23 1945 06/12/23 2159 06/13/23 0518 06/13/23 0851  BP: (!) 126/50   123/68  Pulse: 63   70  Resp: 18   18  Temp:  98.1 F (36.7 C) 98.2 F (36.8 C) 97.7 F (36.5 C)  TempSrc:  Oral Oral Oral  SpO2: 100%   97%  Weight:      Height:        Intake/Output Summary (Last 24 hours) at 06/13/2023 1055 Last data filed at 06/13/2023 0940 Gross per 24 hour  Intake 1003.26 ml  Output 1150 ml  Net -146.74 ml   Filed Weights   06/12/23 0443  Weight: 108.4 kg    General appearance: Awake alert.  In no distress Resp: Clear to auscultation bilaterally.  Normal effort Cardio: S1-S2 is normal regular.  No S3-S4.  No rubs murmurs or bruit GI: Abdomen is soft.  Nontender nondistended.  Bowel sounds are present normal.  No masses organomegaly Extremities: No edema.  Moving all of his extremities.  Physical deconditioning is noted. Neurologic:  No focal neurological deficits.    Lab Results:  Data Reviewed: I have personally reviewed following labs and reports of the imaging studies  CBC: Recent Labs  Lab 06/12/23 0446 06/12/23 0458 06/13/23 0354  WBC  --  10.2 7.5  NEUTROABS  --  8.1*  --   HGB 11.6* 12.1* 11.0*  HCT 34.0* 36.5* 31.8*  MCV  --  94.1 91.1  PLT  --  185 139*    Basic Metabolic Panel: Recent Labs  Lab 06/12/23 0446 06/12/23 0458 06/13/23 0354  NA 139 136 136  K 3.7 3.5 3.8  CL 103 103 107  CO2  --  20* 23  GLUCOSE 101* 105* 86  BUN 22 20 20   CREATININE 1.70* 1.73* 1.15  CALCIUM  --  7.9* 7.7*  MG  --   --  1.9    GFR: Estimated Creatinine Clearance: 58.5 mL/min (by C-G formula based on SCr of 1.15 mg/dL).  Liver Function Tests: Recent Labs  Lab 06/12/23 0458  AST 23  ALT 13  ALKPHOS 50  BILITOT 1.3*  PROT 5.8*  ALBUMIN 2.9*    Recent Labs  Lab 06/12/23 0458  LIPASE 24    Cardiac Enzymes: Recent Labs  Lab 06/12/23 0458  CKTOTAL 89    Recent Results (from the past 240 hours)  Resp panel by RT-PCR (RSV, Flu  A&B, Covid) Anterior Nasal Swab     Status: Abnormal   Collection Time: 06/12/23  4:51 AM   Specimen: Anterior Nasal Swab  Result Value Ref Range Status   SARS Coronavirus 2 by RT PCR POSITIVE (A) NEGATIVE Final   Influenza A by PCR NEGATIVE NEGATIVE Final   Influenza B by PCR NEGATIVE NEGATIVE Final    Comment: (NOTE) The Xpert Xpress SARS-CoV-2/FLU/RSV plus assay is intended as an aid in the diagnosis of influenza from Nasopharyngeal swab specimens and should not be used as a sole basis for treatment. Nasal washings and aspirates  are unacceptable for Xpert Xpress SARS-CoV-2/FLU/RSV testing.  Fact Sheet for Patients: BloggerCourse.com  Fact Sheet for Healthcare Providers: SeriousBroker.it  This test is not yet approved or cleared by the [place] FDA and has been authorized for detection and/or diagnosis of SARS-CoV-2 by FDA under an Emergency Use Authorization (EUA). This EUA will remain in effect (meaning this test can be used) for the duration of the COVID-19 declaration under Section 564(b)(1) of the Act, 21 U.S.C. section 360bbb-3(b)(1), unless the authorization is terminated or revoked.     Resp Syncytial Virus by PCR NEGATIVE NEGATIVE Final    Comment: (NOTE) Fact Sheet for Patients: BloggerCourse.com  Fact Sheet for Healthcare Providers: SeriousBroker.it  This test is not yet approved or cleared by the [place] FDA and has been authorized for detection and/or diagnosis of SARS-CoV-2 by FDA under an Emergency Use Authorization (EUA). This EUA will remain in effect (meaning this test can be used) for the duration of the COVID-19 declaration under Section 564(b)(1) of the Act, 21 U.S.C. section 360bbb-3(b)(1), unless the authorization is terminated or revoked.  Performed at [facility], [address] [place], [place] [address]   Blood culture  (routine x 2)     Status: None (Preliminary result)   Collection Time: 06/12/23  7:00 AM   Specimen: BLOOD RIGHT HAND  Result Value Ref Range Status   Specimen Description BLOOD RIGHT HAND  Final   Special Requests   Final    BOTTLES DRAWN AEROBIC AND ANAEROBIC Blood Culture adequate volume   Culture   Final    NO GROWTH 1 DAY Performed at [facility], [address] [place], [place] [address]    Report Status PENDING  Incomplete  Blood culture (routine x 2)     Status: None (Preliminary result)   Collection Time: 06/12/23  7:00 AM   Specimen: BLOOD RIGHT ARM  Result Value Ref Range Status   Specimen Description BLOOD RIGHT ARM  Final   Special Requests   Final    BOTTLES DRAWN AEROBIC AND ANAEROBIC Blood Culture adequate volume   Culture   Final    NO GROWTH 1 DAY Performed at [facility], [address] [place], [place] [address]    Report Status PENDING  Incomplete  Gastrointestinal Panel by PCR , Stool     Status: Abnormal   Collection Time: 06/12/23  1:48 PM   Specimen: Stool  Result Value Ref Range Status   Campylobacter species NOT DETECTED NOT DETECTED Final   Plesimonas shigelloides NOT DETECTED NOT DETECTED Final   Salmonella species NOT DETECTED NOT DETECTED Final   Yersinia enterocolitica NOT DETECTED NOT DETECTED Final   Vibrio species NOT DETECTED NOT DETECTED Final   Vibrio cholerae NOT DETECTED NOT DETECTED Final   Enteroaggregative E coli (EAEC) NOT DETECTED NOT DETECTED Final   Enteropathogenic E coli (EPEC) NOT DETECTED NOT DETECTED Final   Enterotoxigenic E coli (ETEC) NOT DETECTED NOT DETECTED Final   Shiga like toxin producing E coli (STEC) NOT DETECTED NOT DETECTED Final   Shigella/Enteroinvasive E coli (EIEC) NOT DETECTED NOT DETECTED Final   Cryptosporidium NOT DETECTED NOT DETECTED Final   Cyclospora cayetanensis NOT DETECTED NOT DETECTED Final   Entamoeba histolytica NOT DETECTED NOT DETECTED Final   Giardia lamblia NOT DETECTED NOT  DETECTED Final   Adenovirus F40/41 NOT DETECTED NOT DETECTED Final   Astrovirus NOT DETECTED NOT DETECTED Final   Norovirus GI/GII DETECTED (A) NOT DETECTED Final  Comment: RESULT CALLED TO, READ BACK BY AND VERIFIED WITH: KRISTI HINSHAW 06/13/23 0953 MW    Rotavirus A NOT DETECTED NOT DETECTED Final   Sapovirus (I, II, IV, and V) NOT DETECTED NOT DETECTED Final    Comment: Performed at [facility], [address] [place], [place] [address]  C Difficile Quick Screen w PCR reflex     Status: None   Collection Time: 06/12/23  1:48 PM   Specimen: STOOL  Result Value Ref Range Status   C Diff antigen NEGATIVE NEGATIVE Final   C Diff toxin NEGATIVE NEGATIVE Final   C Diff interpretation No C. difficile detected.  Final    Comment: Performed at [facility], [address] [place], [place] [address]      Radiology Studies: CT ABDOMEN PELVIS W CONTRAST Result Date: 06/12/2023 CLINICAL DATA:  Abdominal trauma, blunt. Unwitnessed fall. Several days of diarrhea and abdominal pain with distension. EXAM: CT ABDOMEN AND PELVIS WITH CONTRAST TECHNIQUE: Multidetector CT imaging of the abdomen and pelvis was performed using the standard protocol following bolus administration of intravenous contrast. RADIATION DOSE REDUCTION: This exam was performed according to the departmental dose-optimization program which includes automated exposure control, adjustment of the mA and/or kV according to patient size and/or use of iterative reconstruction technique. CONTRAST:  80mL OMNIPAQUE IOHEXOL 350 MG/ML SOLN COMPARISON:  10/08/2009 FINDINGS: Lower chest: Pacer leads in the right ventricle and right atrium. Reticulation of lung markings at the bases is likely scar. Hepatobiliary: Cluster of cysts in the left lobe liver, some showing interval collapse since 2011, maximal size of 2.4 cm.No suspicious liver finding. No gallstone or biliary dilatation. Pancreas: Unremarkable. Spleen: Unremarkable.  Adrenals/Urinary Tract: Negative adrenals. No hydronephrosis or stone. Thick walled bladder likely related to chronic outlet obstruction. No bladder over distension currently. Stomach/Bowel: Distal colonic fluid levels. No bowel wall thickening or obstruction. Vascular/Lymphatic: No acute vascular abnormality. Scattered atheromatous calcification. No mass or adenopathy. Reproductive:Enlarged prostate up lifting the bladder base. Other: No ascites or pneumoperitoneum. Musculoskeletal: Lumbar spine degeneration with L3-4 solid arthrodesis. No acute or aggressive finding. IMPRESSION: 1. Distal colonic fluid levels compatible with diarrheal illness. No bowel wall thickening. 2. History of fall.  No traumatic finding. Electronically Signed   By: Tiburcio Pea M.D.   On: 06/12/2023 05:50   CT Head Wo Contrast Result Date: 06/12/2023 CLINICAL DATA:  Head trauma. Sick with nausea, vomiting, and diarrhea. EXAM: CT HEAD WITHOUT CONTRAST CT CERVICAL SPINE WITHOUT CONTRAST TECHNIQUE: Multidetector CT imaging of the head and cervical spine was performed following the standard protocol without intravenous contrast. Multiplanar CT image reconstructions of the cervical spine were also generated. RADIATION DOSE REDUCTION: This exam was performed according to the departmental dose-optimization program which includes automated exposure control, adjustment of the mA and/or kV according to patient size and/or use of iterative reconstruction technique. COMPARISON:  07/02/2021 FINDINGS: CT HEAD FINDINGS Brain: No evidence of acute infarction, hemorrhage, hydrocephalus, extra-axial collection or mass lesion/mass effect. Generalized cortical volume loss in keeping with age. Vascular: No hyperdense vessel or unexpected calcification. Skull: Normal. Negative for fracture or focal lesion. Sinuses/Orbits: No acute finding. CT CERVICAL SPINE FINDINGS Alignment: Normal. Skull base and vertebrae: No acute fracture. No primary bone lesion or  focal pathologic process. Soft tissues and spinal canal: No prevertebral fluid or swelling. No visible canal hematoma. Disc levels:  Ordinary cervical spine degeneration. Upper chest: No evidence of injury IMPRESSION: No evidence of acute intracranial or cervical spine injury. Electronically Signed  By: Tiburcio Pea M.D.   On: 06/12/2023 05:31   CT Cervical Spine Wo Contrast Result Date: 06/12/2023 CLINICAL DATA:  Head trauma. Sick with nausea, vomiting, and diarrhea. EXAM: CT HEAD WITHOUT CONTRAST CT CERVICAL SPINE WITHOUT CONTRAST TECHNIQUE: Multidetector CT imaging of the head and cervical spine was performed following the standard protocol without intravenous contrast. Multiplanar CT image reconstructions of the cervical spine were also generated. RADIATION DOSE REDUCTION: This exam was performed according to the departmental dose-optimization program which includes automated exposure control, adjustment of the mA and/or kV according to patient size and/or use of iterative reconstruction technique. COMPARISON:  07/02/2021 FINDINGS: CT HEAD FINDINGS Brain: No evidence of acute infarction, hemorrhage, hydrocephalus, extra-axial collection or mass lesion/mass effect. Generalized cortical volume loss in keeping with age. Vascular: No hyperdense vessel or unexpected calcification. Skull: Normal. Negative for fracture or focal lesion. Sinuses/Orbits: No acute finding. CT CERVICAL SPINE FINDINGS Alignment: Normal. Skull base and vertebrae: No acute fracture. No primary bone lesion or focal pathologic process. Soft tissues and spinal canal: No prevertebral fluid or swelling. No visible canal hematoma. Disc levels:  Ordinary cervical spine degeneration. Upper chest: No evidence of injury IMPRESSION: No evidence of acute intracranial or cervical spine injury. Electronically Signed   By: Tiburcio Pea M.D.   On: 06/12/2023 05:31   DG Chest Port 1 View Result Date: 06/12/2023 CLINICAL DATA:  89 year old male  with history of trauma from a fall. EXAM: PORTABLE CHEST 1 VIEW COMPARISON:  Chest x-ray 08/16/2021. FINDINGS: Lung volumes are low. Mild elevation of the right hemidiaphragm. No acute consolidative airspace disease. No pleural effusions. No pneumothorax. No evidence of pulmonary edema. Heart size is normal. Upper mediastinal contours are within normal limits. Left-sided pacemaker device in place with lead tips projecting over the expected location of the right atrium and right ventricle. Atherosclerotic calcifications are noted in the thoracic aorta. IMPRESSION: 1. Low lung volumes without radiographic evidence of acute cardiopulmonary disease. 2. Aortic atherosclerosis. Electronically Signed   By: Trudie Reed M.D.   On: 06/12/2023 05:31       LOS: 1 day   Shilee Biggs  [facility] Pager on [email or web address]  06/13/2023, 10:55 AM

## 2023-06-13 NOTE — Progress Notes (Signed)
Patient and daughter is refusing the remdesivir and is requesting to remove it from his orders. MD notified.  Kenard Gower, RN

## 2023-06-13 NOTE — Plan of Care (Signed)
  Problem: Respiratory: Goal: Will maintain a patent airway Outcome: Adequate for Discharge

## 2023-06-14 ENCOUNTER — Other Ambulatory Visit (HOSPITAL_COMMUNITY): Payer: Self-pay

## 2023-06-14 ENCOUNTER — Other Ambulatory Visit: Payer: Self-pay

## 2023-06-14 DIAGNOSIS — A0811 Acute gastroenteropathy due to Norwalk agent: Secondary | ICD-10-CM | POA: Diagnosis not present

## 2023-06-14 DIAGNOSIS — R609 Edema, unspecified: Secondary | ICD-10-CM

## 2023-06-14 DIAGNOSIS — I10 Essential (primary) hypertension: Secondary | ICD-10-CM

## 2023-06-14 LAB — CBC
HCT: 33 % — ABNORMAL LOW (ref 39.0–52.0)
Hemoglobin: 11.1 g/dL — ABNORMAL LOW (ref 13.0–17.0)
MCH: 30.7 pg (ref 26.0–34.0)
MCHC: 33.6 g/dL (ref 30.0–36.0)
MCV: 91.4 fL (ref 80.0–100.0)
Platelets: 147 10*3/uL — ABNORMAL LOW (ref 150–400)
RBC: 3.61 MIL/uL — ABNORMAL LOW (ref 4.22–5.81)
RDW: 13.1 % (ref 11.5–15.5)
WBC: 7.7 10*3/uL (ref 4.0–10.5)
nRBC: 0 % (ref 0.0–0.2)

## 2023-06-14 LAB — COMPREHENSIVE METABOLIC PANEL
ALT: 24 U/L (ref 0–44)
AST: 44 U/L — ABNORMAL HIGH (ref 15–41)
Albumin: 2.5 g/dL — ABNORMAL LOW (ref 3.5–5.0)
Alkaline Phosphatase: 57 U/L (ref 38–126)
Anion gap: 8 (ref 5–15)
BUN: 20 mg/dL (ref 8–23)
CO2: 24 mmol/L (ref 22–32)
Calcium: 8 mg/dL — ABNORMAL LOW (ref 8.9–10.3)
Chloride: 106 mmol/L (ref 98–111)
Creatinine, Ser: 1.12 mg/dL (ref 0.61–1.24)
GFR, Estimated: >60 mL/min (ref >60)
Glucose, Bld: 110 mg/dL — ABNORMAL HIGH (ref 70–99)
Potassium: 3.9 mmol/L (ref 3.5–5.1)
Sodium: 138 mmol/L (ref 135–145)
Total Bilirubin: 0.7 mg/dL (ref 0.0–1.2)
Total Protein: 5.4 g/dL — ABNORMAL LOW (ref 6.5–8.1)

## 2023-06-14 LAB — MAGNESIUM: Magnesium: 1.9 mg/dL (ref 1.7–2.4)

## 2023-06-14 MED ORDER — FUROSEMIDE 40 MG PO TABS: ORAL_TABLET | ORAL | 3 refills | Status: AC

## 2023-06-14 MED ORDER — SACCHAROMYCES BOULARDII 250 MG PO CAPS: 250.0000 mg | ORAL_CAPSULE | Freq: Two times a day (BID) | ORAL | Status: DC

## 2023-06-14 MED ORDER — ACIDOPHILUS PO CAPS
1.0000 | ORAL_CAPSULE | Freq: Three times a day (TID) | ORAL | 0 refills | Status: AC
  Filled 2023-06-14: qty 42, 14d supply, fill #0

## 2023-06-14 MED ORDER — LOPERAMIDE HCL 2 MG PO CAPS
4.0000 mg | ORAL_CAPSULE | Freq: Three times a day (TID) | ORAL | 0 refills | Status: DC | PRN
  Filled 2023-06-14: qty 30, 5d supply, fill #0

## 2023-06-14 NOTE — Discharge Summary (Addendum)
[facility]  Physician Discharge Summary   Patient ID: [name] MRN: [id number] DOB/AGE: 16-Apr-1931 89 y.o.  Admit date: 06/12/2023 Discharge date: 06/14/2023    PCP: [name] Cowboy, MD  DISCHARGE DIAGNOSES:    Diarrhea due to Norovirus   Lactic acidosis   Transient hypotension   Acute kidney injury superimposed on chronic kidney disease (HCC)   Paroxysmal atrial fibrillation (HCC)   Chronic anticoagulation   Normocytic anemia   Rheumatoid arthritis (HCC)   Hyperlipidemia, mixed   Benign prostatic hyperplasia   RECOMMENDATIONS FOR OUTPATIENT FOLLOW UP: F/u with PCP.   Home Health:PT  Equipment/Devices:walker   CODE STATUS:Full code   DISCHARGE CONDITION: fair  Diet recommendation: As before  INITIAL HISTORY: 89 y.o. [name] with medical history significant of hypertension, paroxysmal atrial fibrillation on chronic anticoagulation, PPM, COPD, BPH, RA presented after having a fall while going to the bathroom this morning.  Patient has had episodes of diarrhea as well.  He was found to be positive for COVID-19.  He was hospitalized for further management.    Consultants: None  HOSPITAL COURSE:     Acute diarrhea secondary to norovirus Initially thought to be secondary to COVID-19.  C. difficile is negative.   GI pathogen panel is positive for norovirus which is the likely reason for his diarrhea.   Abdomen is benign.  CT of the abdomen pelvis was reassuring. Improved with supportive care.   COVID-19 infection Has only very minimal respiratory symptoms.  No acute findings on chest x-ray.     Lactic acidosis Resolved.   Transient hypotension/near syncope Likely due to hypovolemia from acute diarrhea.  Blood pressures have stabilized.    Acute kidney injury Creatinine was noted to be 1.73.  Improved with hydration.   Hematuria Daughter mentioned that patient passed some blood in the urine.  Urine at bedside noted to be clear.  No gross hematuria  noted.  Penile examination was unremarkable.  UA was repeated and showed microscopic hematuria. Patient asked to reach out to his urologist if symptoms recur.  No concerning findings in the GU tract on the recently done CT scan.   Paroxysmal atrial fibrillation status post pacemaker He is chronically anticoagulated with Eliquis which is being continued.   Normocytic anemia Mild drop in hemoglobin is likely dilutional.     History of rheumatoid arthritis on chronic steroids Continue prednisone.  No clear indication for stress dosing at this time.   Hyperlipidemia Continue Crestor.   History of BPH Continue Proscar and terazosin.   Stable for discharge today. Discussed with his daughter.   PERTINENT LABS:  The results of significant diagnostics from this hospitalization (including imaging, microbiology, ancillary and laboratory) are listed below for reference.    Microbiology: Recent Results (from the past 240 hours)  Resp panel by RT-PCR (RSV, Flu A&B, Covid) Anterior Nasal Swab     Status: Abnormal   Collection Time: 06/12/23  4:51 AM   Specimen: Anterior Nasal Swab  Result Value Ref Range Status   SARS Coronavirus 2 by RT PCR POSITIVE (A) NEGATIVE Final   Influenza A by PCR NEGATIVE NEGATIVE Final   Influenza B by PCR NEGATIVE NEGATIVE Final    Comment: (NOTE) The Xpert Xpress SARS-CoV-2/FLU/RSV plus assay is intended as an aid in the diagnosis of influenza from Nasopharyngeal swab specimens and should not be used as a sole basis for treatment. Nasal washings and aspirates are unacceptable for Xpert Xpress SARS-CoV-2/FLU/RSV testing.  Fact Sheet for Patients: BloggerCourse.com  Fact Sheet  for Healthcare Providers: SeriousBroker.it  This test is not yet approved or cleared by the [place] and has been authorized for detection and/or diagnosis of SARS-CoV-2 by FDA under an Emergency Use Authorization (EUA). This  EUA will remain in effect (meaning this test can be used) for the duration of the COVID-19 declaration under Section 564(b)(1) of the Act, 21 U.S.C. section 360bbb-3(b)(1), unless the authorization is terminated or revoked.     Resp Syncytial Virus by PCR NEGATIVE NEGATIVE Final    Comment: (NOTE) Fact Sheet for Patients: BloggerCourse.com  Fact Sheet for Healthcare Providers: SeriousBroker.it  This test is not yet approved or cleared by the [place] FDA and has been authorized for detection and/or diagnosis of SARS-CoV-2 by FDA under an Emergency Use Authorization (EUA). This EUA will remain in effect (meaning this test can be used) for the duration of the COVID-19 declaration under Section 564(b)(1) of the Act, 21 U.S.C. section 360bbb-3(b)(1), unless the authorization is terminated or revoked.  Performed at [facility], [address], [place], [place] [address]   Blood culture (routine x 2)     Status: None (Preliminary result)   Collection Time: 06/12/23  7:00 AM   Specimen: BLOOD RIGHT HAND  Result Value Ref Range Status   Specimen Description BLOOD RIGHT HAND  Final   Special Requests   Final    BOTTLES DRAWN AEROBIC AND ANAEROBIC Blood Culture adequate volume   Culture   Final    NO GROWTH 3 DAYS Performed at [facility], [address] [place], [place] [address]    Report Status PENDING  Incomplete  Blood culture (routine x 2)     Status: None (Preliminary result)   Collection Time: 06/12/23  7:00 AM   Specimen: BLOOD RIGHT ARM  Result Value Ref Range Status   Specimen Description BLOOD RIGHT ARM  Final   Special Requests   Final    BOTTLES DRAWN AEROBIC AND ANAEROBIC Blood Culture adequate volume   Culture   Final    NO GROWTH 3 DAYS Performed at [facility], [address], [place], [place] [address]    Report Status PENDING  Incomplete  Gastrointestinal Panel by PCR , Stool      Status: Abnormal   Collection Time: 06/12/23  1:48 PM   Specimen: Stool  Result Value Ref Range Status   Campylobacter species NOT DETECTED NOT DETECTED Final   Plesimonas shigelloides NOT DETECTED NOT DETECTED Final   Salmonella species NOT DETECTED NOT DETECTED Final   Yersinia enterocolitica NOT DETECTED NOT DETECTED Final   Vibrio species NOT DETECTED NOT DETECTED Final   Vibrio cholerae NOT DETECTED NOT DETECTED Final   Enteroaggregative E coli (EAEC) NOT DETECTED NOT DETECTED Final   Enteropathogenic E coli (EPEC) NOT DETECTED NOT DETECTED Final   Enterotoxigenic E coli (ETEC) NOT DETECTED NOT DETECTED Final   Shiga like toxin producing E coli (STEC) NOT DETECTED NOT DETECTED Final   Shigella/Enteroinvasive E coli (EIEC) NOT DETECTED NOT DETECTED Final   Cryptosporidium NOT DETECTED NOT DETECTED Final   Cyclospora cayetanensis NOT DETECTED NOT DETECTED Final   Entamoeba histolytica NOT DETECTED NOT DETECTED Final   Giardia lamblia NOT DETECTED NOT DETECTED Final   Adenovirus F40/[name] NOT DETECTED NOT DETECTED Final   Astrovirus NOT DETECTED NOT DETECTED Final   Norovirus GI/GII DETECTED (A) NOT DETECTED Final    Comment: RESULT CALLED TO, READ BACK BY AND VERIFIED WITH: KRISTI HINSHAW 06/13/23 0953 MW  Rotavirus A NOT DETECTED NOT DETECTED Final   Sapovirus (I, II, IV, and V) NOT DETECTED NOT DETECTED Final    Comment: Performed at [facility], [address] [place], [place] [address]  C Difficile Quick Screen w PCR reflex     Status: None   Collection Time: 06/12/23  1:48 PM   Specimen: STOOL  Result Value Ref Range Status   C Diff antigen NEGATIVE NEGATIVE Final   C Diff toxin NEGATIVE NEGATIVE Final   C Diff interpretation No C. difficile detected.  Final    Comment: Performed at [facility], [address], [place], [place] [address]     Labs:   Basic Metabolic Panel: Recent Labs  Lab 06/12/23 0446 06/12/23 0458 06/13/23 0354 06/14/23 0304   NA 139 136 136 138  K 3.7 3.5 3.8 3.9  CL 103 103 107 106  CO2  --  20* 23 24  GLUCOSE 101* 105* 86 110*  BUN 22 20 20 20   CREATININE 1.70* 1.73* 1.15 1.12  CALCIUM  --  7.9* 7.7* 8.0*  MG  --   --  1.9 1.9   Liver Function Tests: Recent Labs  Lab 06/12/23 0458 06/14/23 0304  AST 23 44*  ALT 13 24  ALKPHOS 50 57  BILITOT 1.3* 0.7  PROT 5.8* 5.4*  ALBUMIN 2.9* 2.5*   Recent Labs  Lab 06/12/23 0458  LIPASE 24   CBC: Recent Labs  Lab 06/12/23 0446 06/12/23 0458 06/13/23 0354 06/14/23 0304  WBC  --  10.2 7.5 7.7  NEUTROABS  --  8.1*  --   --   HGB 11.6* 12.1* 11.0* 11.1*  HCT 34.0* 36.5* 31.8* 33.0*  MCV  --  94.1 91.1 91.4  PLT  --  185 139* 147*   Cardiac Enzymes: Recent Labs  Lab 06/12/23 0458  CKTOTAL 89      IMAGING STUDIES CT ABDOMEN PELVIS W CONTRAST Result Date: 06/12/2023 CLINICAL DATA:  Abdominal trauma, blunt. Unwitnessed fall. Several days of diarrhea and abdominal pain with distension. EXAM: CT ABDOMEN AND PELVIS WITH CONTRAST TECHNIQUE: Multidetector CT imaging of the abdomen and pelvis was performed using the standard protocol following bolus administration of intravenous contrast. RADIATION DOSE REDUCTION: This exam was performed according to the departmental dose-optimization program which includes automated exposure control, adjustment of the mA and/or kV according to patient size and/or use of iterative reconstruction technique. CONTRAST:  80mL OMNIPAQUE IOHEXOL 350 MG/ML SOLN COMPARISON:  10/08/2009 FINDINGS: Lower chest: Pacer leads in the right ventricle and right atrium. Reticulation of lung markings at the bases is likely scar. Hepatobiliary: Cluster of cysts in the left lobe liver, some showing interval collapse since 2011, maximal size of 2.4 cm.No suspicious liver finding. No gallstone or biliary dilatation. Pancreas: Unremarkable. Spleen: Unremarkable. Adrenals/Urinary Tract: Negative adrenals. No hydronephrosis or stone. Thick walled  bladder likely related to chronic outlet obstruction. No bladder over distension currently. Stomach/Bowel: Distal colonic fluid levels. No bowel wall thickening or obstruction. Vascular/Lymphatic: No acute vascular abnormality. Scattered atheromatous calcification. No mass or adenopathy. Reproductive:Enlarged prostate up lifting the bladder base. Other: No ascites or pneumoperitoneum. Musculoskeletal: Lumbar spine degeneration with L3-4 solid arthrodesis. No acute or aggressive finding. IMPRESSION: 1. Distal colonic fluid levels compatible with diarrheal illness. No bowel wall thickening. 2. History of fall.  No traumatic finding. Electronically Signed   By: Tiburcio Pea M.D.   On: 06/12/2023 05:50   CT Head Wo Contrast Result Date: 06/12/2023 CLINICAL DATA:  Head  trauma. Sick with nausea, vomiting, and diarrhea. EXAM: CT HEAD WITHOUT CONTRAST CT CERVICAL SPINE WITHOUT CONTRAST TECHNIQUE: Multidetector CT imaging of the head and cervical spine was performed following the standard protocol without intravenous contrast. Multiplanar CT image reconstructions of the cervical spine were also generated. RADIATION DOSE REDUCTION: This exam was performed according to the departmental dose-optimization program which includes automated exposure control, adjustment of the mA and/or kV according to patient size and/or use of iterative reconstruction technique. COMPARISON:  07/02/2021 FINDINGS: CT HEAD FINDINGS Brain: No evidence of acute infarction, hemorrhage, hydrocephalus, extra-axial collection or mass lesion/mass effect. Generalized cortical volume loss in keeping with age. Vascular: No hyperdense vessel or unexpected calcification. Skull: Normal. Negative for fracture or focal lesion. Sinuses/Orbits: No acute finding. CT CERVICAL SPINE FINDINGS Alignment: Normal. Skull base and vertebrae: No acute fracture. No primary bone lesion or focal pathologic process. Soft tissues and spinal canal: No prevertebral fluid or  swelling. No visible canal hematoma. Disc levels:  Ordinary cervical spine degeneration. Upper chest: No evidence of injury IMPRESSION: No evidence of acute intracranial or cervical spine injury. Electronically Signed   By: Tiburcio Pea M.D.   On: 06/12/2023 05:31   CT Cervical Spine Wo Contrast Result Date: 06/12/2023 CLINICAL DATA:  Head trauma. Sick with nausea, vomiting, and diarrhea. EXAM: CT HEAD WITHOUT CONTRAST CT CERVICAL SPINE WITHOUT CONTRAST TECHNIQUE: Multidetector CT imaging of the head and cervical spine was performed following the standard protocol without intravenous contrast. Multiplanar CT image reconstructions of the cervical spine were also generated. RADIATION DOSE REDUCTION: This exam was performed according to the departmental dose-optimization program which includes automated exposure control, adjustment of the mA and/or kV according to patient size and/or use of iterative reconstruction technique. COMPARISON:  07/02/2021 FINDINGS: CT HEAD FINDINGS Brain: No evidence of acute infarction, hemorrhage, hydrocephalus, extra-axial collection or mass lesion/mass effect. Generalized cortical volume loss in keeping with age. Vascular: No hyperdense vessel or unexpected calcification. Skull: Normal. Negative for fracture or focal lesion. Sinuses/Orbits: No acute finding. CT CERVICAL SPINE FINDINGS Alignment: Normal. Skull base and vertebrae: No acute fracture. No primary bone lesion or focal pathologic process. Soft tissues and spinal canal: No prevertebral fluid or swelling. No visible canal hematoma. Disc levels:  Ordinary cervical spine degeneration. Upper chest: No evidence of injury IMPRESSION: No evidence of acute intracranial or cervical spine injury. Electronically Signed   By: Tiburcio Pea M.D.   On: 06/12/2023 05:31   DG Chest Port 1 View Result Date: 06/12/2023 CLINICAL DATA:  89 year old [name] with history of trauma from a fall. EXAM: PORTABLE CHEST 1 VIEW COMPARISON:  Chest  x-ray 08/16/2021. FINDINGS: Lung volumes are low. Mild elevation of the right hemidiaphragm. No acute consolidative airspace disease. No pleural effusions. No pneumothorax. No evidence of pulmonary edema. Heart size is normal. Upper mediastinal contours are within normal limits. Left-sided pacemaker device in place with lead tips projecting over the expected location of the right atrium and right ventricle. Atherosclerotic calcifications are noted in the thoracic aorta. IMPRESSION: 1. Low lung volumes without radiographic evidence of acute cardiopulmonary disease. 2. Aortic atherosclerosis. Electronically Signed   By: Trudie Reed M.D.   On: 06/12/2023 05:31   CUP PACEART INCLINIC DEVICE CHECK Result Date: 05/23/2023 Normal in-clinic Pacemaker check. Thresholds, sensing, and impedance WNL or stable for patient over time. episode labeled monitored VT is a PAT with some PACs, PVCs, not true VT. Estimated longevity __9.6 years__. Pt enrolled in remote follow-up. Normal in-clinic Pacemaker check. Thresholds, sensing, and impedance  WNL or stable for patient over time. episode labeled monitored VT is a PAT with some PACs, PVCs, not true VT. Estimated longevity __9.6 years__. Pt enrolled in remote follow-up. RU   DISCHARGE EXAMINATION: Vitals:   06/13/23 2339 06/14/23 0259 06/14/23 0839 06/14/23 1120  BP: 124/60 125/64 126/61 123/62  Pulse: 65 62 60 67  Resp: 20 20 15 19   Temp: 98 F (36.7 C) 98.1 F (36.7 C) 97.6 F (36.4 C) 97.6 F (36.4 C)  TempSrc: Oral Oral Tympanic Oral  SpO2: 96% 97% 95% 99%  Weight:      Height:       General appearance: alert, cooperative, appears stated age, and no distress Resp: clear to auscultation bilaterally Cardio: regular rate and rhythm, S1, S2 normal, no murmur, click, rub or gallop GI: soft, non-tender; bowel sounds normal; no masses,  no organomegaly  DISPOSITION: Home  Discharge Instructions     Call MD for:  difficulty breathing, headache or visual  disturbances   Complete by: As directed    Call MD for:  extreme fatigue   Complete by: As directed    Call MD for:  persistant dizziness or light-headedness   Complete by: As directed    Call MD for:  persistant nausea and vomiting   Complete by: As directed    Call MD for:  severe uncontrolled pain   Complete by: As directed    Call MD for:  temperature >100.4   Complete by: As directed    Diet - low sodium heart healthy   Complete by: As directed    Discharge instructions   Complete by: As directed    Please be sure to follow-up with your primary care provider in 1 week.  Take your medications as prescribed.  You were cared for by a hospitalist during your hospital stay. If you have any questions about your discharge medications or the care you received while you were in the hospital after you are discharged, you can call the unit and asked to speak with the hospitalist on call if the hospitalist that took care of you is not available. Once you are discharged, your primary care physician will handle any further medical issues. Please note that NO REFILLS for any discharge medications will be authorized once you are discharged, as it is imperative that you return to your primary care physician (or establish a relationship with a primary care physician if you do not have one) for your aftercare needs so that they can reassess your need for medications and monitor your lab values. If you do not have a primary care physician, you can call [phone] for a physician referral.   Increase activity slowly   Complete by: As directed         Allergies as of 06/14/2023       Reactions   Ace Inhibitors Other (See Comments)   Pt is uncertain as of 06/12/2023   Asa [aspirin] Other (See Comments)   High Dose asprin -- Pt is unaware of reaction.   Penicillins Hives   Vasotec [enalapril] Other (See Comments)   Cough. Pt is unaware of allergy and reaction.    Clindamycin/lincomycin Hives   Hives         Medication List     TAKE these medications    Acidophilus Caps capsule Take 1 capsule by mouth 3 (three) times daily with meals for 14 days.   apixaban 5 MG Tabs tablet Commonly known as: Eliquis Take  1 tablet  2 x / day (every 12 hours)  for Afib to Prevent Blood Clots         /       TAKE      BY      MOUTH   finasteride 5 MG tablet Commonly known as: PROSCAR Take  1 tablet  Daily  for Prostate   loperamide 2 MG capsule Commonly known as: IMODIUM Take 2 capsules (4 mg total) by mouth 3 (three) times daily as needed for diarrhea or loose stools.   magnesium gluconate 500 MG tablet Commonly known as: MAGONATE Take 500 mg by mouth daily.   olmesartan 20 MG tablet Commonly known as: BENICAR Take 0.5 tablets (10 mg total) by mouth daily.   OVER THE COUNTER MEDICATION 1 capsule daily. Naturebell Zinc Quercitin With Vitamin C and D3 (130 mg )   predniSONE 5 MG tablet Commonly known as: DELTASONE Take 1 tablet by mouth twice a day or as directed for asthma and rheumatoid arthritis   rosuvastatin 20 MG tablet Commonly known as: CRESTOR Take 1 tablet by mouth daily for cholesterol   terazosin 10 MG capsule Commonly known as: HYTRIN Take  1 capsule  at Bedtime  for Prostate   Vitamin D 125 MCG (5000 UT) Caps Take 5,000 Units by mouth as directed.          Follow-up Information     [name] Cowboy, MD. Schedule an appointment as soon as possible for a visit in 1 week(s).   Specialty: Internal Medicine Contact information: [address] [place] [address] [phone]         [facility] Follow up.   Why: ([employer]) rolling walker arranged- to be delivered to room Contact information: [address] [place] [address] [phone]         [facility] Follow up.   Why: HHPT arranged- they will contact you to schedule visits                TOTAL DISCHARGE TIME: 35  mins  Meilin Brosh [employer] on [email or web address]  06/15/2023, 12:05 PM

## 2023-06-14 NOTE — TOC Transition Note (Addendum)
Transition of Care (TOC) - Discharge Note Donn Pierini RN, BSN Transitions of Care Unit 4E- RN Case Manager See Treatment Team for direct phone #   Patient Details  Name: [name] MRN: [id number] Date of Birth: [birth year]  Transition of Care [facility]) CM/SW Contact:  Darrold Span, RN Phone Number: 06/14/2023, 1:38 PM   Clinical Narrative:    Pt stable for transition home today, received msg that daughter requesting tall RW for pt, MD has placed order as well as HHPT order. List for [facility] choice provided Per CMS guidelines from [email or web address].pl website with star ratings (copy placed in shadow chart) to the bedside.   Call made to the daughter- [name], to discuss DME and HH needs- per Martie Lee expressed no preference for DME provider- agreeable to have RW delivered to room. Reviewed HH choice- per [name] family is familiar with [facility] as they have used several agencies for pt's wife. Per [name] she would like to try for either [facility] or [facility].   [name] will provide transport home once RW delivered.   Call made to [facility] liaison with no answer, VM left.   1340- have not received return call from [facility]- liaison still not responding to calls or texts- CM reached out to [facility] liaison- Haywood Lasso who has accepted referral for needed HHPT- [facility] to call pt/daughter to f/u and schedule    Update post discharge 2/3- Liaison- Lynette from [facility] called to update- pt has declined [facility] services- stating that he does not need them and he is already driving again which means he is not homebound as well. [facility] closed referral.    Final next level of care: Home w Home Health Services Barriers to Discharge: No Barriers Identified   Patient Goals and CMS Choice Patient states their goals for this hospitalization and ongoing recovery are:: return home CMS Medicare.gov Compare Post Acute Care list provided to:: Patient Represenative (must comment) Choice offered to / list presented  to : Adult Children      Discharge Placement                 Home w/ [facility]      Discharge Plan and Services Additional resources added to the After Visit Summary for     Discharge Planning Services: CM Consult Post Acute Care Choice: Durable Medical Equipment, Home Health          DME Arranged: Dan Humphreys tall DME Agency: [facility] Date DME Agency Contacted: 06/14/23 Time DME Agency Contacted: 1120 Representative spoke with at DME Agency: Ian Malkin HH Arranged: PT HH Agency: [facility] Date [facility] Agency Contacted: 06/14/23 Time HH Agency Contacted: 1338 Representative spoke with at [facility] Agency: Haywood Lasso  Social Drivers of Health (SDOH) Interventions SDOH Screenings   Food Insecurity: No Food Insecurity (06/13/2023)  Housing: Low Risk  (06/13/2023)  Transportation Needs: No Transportation Needs (06/13/2023)  Utilities: Not At Risk (06/13/2023)  Depression (PHQ2-9): Low Risk  (05/20/2023)  Social Connections: Unknown (06/12/2023)  Tobacco Use: High Risk (06/12/2023)     Readmission Risk Interventions    06/14/2023   12:00 PM  Readmission Risk Prevention Plan  Post Dischage Appt Complete  Medication Screening Complete  Transportation Screening Complete

## 2023-06-14 NOTE — Progress Notes (Signed)
Occupational Therapy Evaluation Patient Details Name: [name] MRN: [id number] DOB: [birth year] Today's Date: 06/14/2023   History of Present Illness 89 y.o. male admitted 1/28 who presented after having a fall while going to the bathroom.  Patient has had episodes of diarrhea as well.  He was found to be positive for COVID-19.  PMH: hypertension, paroxysmal atrial fibrillation on chronic anticoagulation, PPM, COPD, BPH, RA   Clinical Impression   PTA, pt lives alone and typically completely independent in all ADLs, IADLs and mobility without AD. Pt with one recent fall PTA. Pt presents now with minor deficits in endurance and dynamic standing balance. Trialed mobility in room without AD at [facility] and no overt LOB. OT did recommend pt to obtain RW and use for mobility around the home initially. Overall, pt requires no more than CGA for ADLs. Educated pt and daughter on safety precautions and ADL modifications to consider initially. Recommend DC home with HHOT.        If plan is discharge home, recommend the following: A little help with bathing/dressing/bathroom;Assistance with cooking/housework;Assist for transportation    Functional Status Assessment  Patient has had a recent decline in their functional status and demonstrates the ability to make significant improvements in function in a reasonable and predictable amount of time.  Equipment Recommendations  Other (comment) Adult nurse)    Recommendations for Other Services       Precautions / Restrictions Precautions Precautions: Fall Restrictions Weight Bearing Restrictions Per Provider Order: No      Mobility Bed Mobility Overal bed mobility: Independent                  Transfers Overall transfer level: Needs assistance Equipment used: None Transfers: Sit to/from Stand Sit to Stand: Supervision           General transfer comment: standing from low bed and BSC over toilet (adjusted height as tall as  possibe)      Balance Overall balance assessment: Needs assistance Sitting-balance support: No upper extremity supported, Feet supported Sitting balance-Leahy Scale: Good     Standing balance support: No upper extremity supported, During functional activity Standing balance-Leahy Scale: Fair Standing balance comment: Fair+                           ADL either performed or assessed with clinical judgement   ADL Overall ADL's : Needs assistance/impaired Eating/Feeding: Independent   Grooming: Contact guard assist;Standing;Wash/dry face;Wash/dry hands;Oral care;Brushing hair Grooming Details (indicate cue type and reason): CGA for safety in standing though no overt LOB. able to stand > 6 min for task Upper Body Bathing: Set up;Sitting   Lower Body Bathing: Contact guard assist;Sitting/lateral leans;Sit to/from stand Lower Body Bathing Details (indicate cue type and reason): to bathe posterior peri region Upper Body Dressing : Set up;Sitting   Lower Body Dressing: Contact guard assist Lower Body Dressing Details (indicate cue type and reason): able to doff/don clean socks EOB Toilet Transfer: Contact guard assist;Ambulation Toilet Transfer Details (indicate cue type and reason): BSC over toilet Toileting- Clothing Manipulation and Hygiene: Supervision/safety;Sitting/lateral lean;Sit to/from stand       Functional mobility during ADLs: Contact guard assist       Vision Baseline Vision/History: 1 Wears glasses Ability to See in Adequate Light: 0 Adequate Patient Visual Report: No change from baseline Vision Assessment?: No apparent visual deficits     Perception         Praxis  Pertinent Vitals/Pain Pain Assessment Pain Assessment: No/denies pain     Extremity/Trunk Assessment Upper Extremity Assessment Upper Extremity Assessment: Overall WFL for tasks assessed;Right hand dominant   Lower Extremity Assessment Lower Extremity Assessment: Defer  to PT evaluation   Cervical / Trunk Assessment Cervical / Trunk Assessment: Normal   Communication Communication Communication: No apparent difficulties   Cognition Arousal: Alert Behavior During Therapy: WFL for tasks assessed/performed Overall Cognitive Status: Within Functional Limits for tasks assessed                                       General Comments       Exercises     Shoulder Instructions      Home Living Family/patient expects to be discharged to:: Private residence Living Arrangements: Alone Available Help at Discharge: Family;Available PRN/intermittently Type of Home: House Home Access: Ramped entrance     Home Layout: One level     Bathroom Shower/Tub: Arts development officer Toilet: Handicapped height     Home Equipment: Agricultural consultant (2 wheels);BSC/3in1;Shower seat;Wheelchair Financial trader (4 wheels);Grab bars - tub/shower;Hand held shower head;Lift chair (adjustable bed, walking stick (some DME may be late wife's as daughter reports walker not tall enough))          Prior Functioning/Environment Prior Level of Function : Independent/Modified Independent;Driving             Mobility Comments: no device used PTA ADLs Comments: B/D self, daughters provide meals, someone cleans and does yardwork, daughters do laundry. fell prior to admission likely in bathroom due to diarrhea/weakness        OT Problem List: Decreased strength;Decreased activity tolerance;Impaired balance (sitting and/or standing);Decreased knowledge of use of DME or AE      OT Treatment/Interventions: Self-care/ADL training;Therapeutic exercise;Energy conservation;DME and/or AE instruction;Therapeutic activities;Balance training;Patient/family education    OT Goals(Current goals can be found in the care plan section) Acute Rehab OT Goals Patient Stated Goal: pt wants to go home. daughter wants to ensure that pt can walk, go to bathroom independently  at home OT Goal Formulation: With patient/family Time For Goal Achievement: 06/28/23 Potential to Achieve Goals: Good  OT Frequency: Min 1X/week    Co-evaluation              AM-PAC OT "6 Clicks" Daily Activity     Outcome Measure Help from another person eating meals?: None Help from another person taking care of personal grooming?: A Little Help from another person toileting, which includes using toliet, bedpan, or urinal?: A Little Help from another person bathing (including washing, rinsing, drying)?: A Little Help from another person to put on and taking off regular upper body clothing?: A Little Help from another person to put on and taking off regular lower body clothing?: A Little 6 Click Score: 19   End of Session Equipment Utilized During Treatment: Gait belt Nurse Communication: Mobility status  Activity Tolerance: Patient tolerated treatment well Patient left: in bed;with call bell/phone within reach;with bed alarm set;with family/visitor present  OT Visit Diagnosis: Unsteadiness on feet (R26.81);Other abnormalities of gait and mobility (R26.89)                Time: 1610-9604 OT Time Calculation (min): 48 min Charges:  OT General Charges $OT Visit: 1 Visit OT Evaluation $OT Eval Moderate Complexity: 1 Mod OT Treatments $Self Care/Home Management : 8-22 mins $Therapeutic Activity: 8-22 mins  Bradd Canary, OTR/L Acute Rehab Services Office: [phone]   Lorre Munroe 06/14/2023, 10:34 AM

## 2023-06-14 NOTE — Plan of Care (Signed)

## 2023-06-14 NOTE — Progress Notes (Signed)
PT Cancellation Note  Patient Details Name: BRUIN BOLGER MRN: [id number] DOB: 12-19-1930   Cancelled Treatment:    Reason Eval/Treat Not Completed: Patient declined  Patient just back to bed and finished bathing with nurse tech. Explained role of PT and pt reports he covered all his questions with OT. Daughter not present in room. RN and MD made aware.   Jerolyn Center, PT Acute Rehabilitation Services  Office [phone]    Zena Amos 06/14/2023, 11:41 AM

## 2023-06-15 ENCOUNTER — Telehealth: Payer: Self-pay

## 2023-06-15 NOTE — Transitions of Care (Post Inpatient/ED Visit) (Signed)
06/15/2023  Name: [name] MRN: [id number] DOB: 10-23-1930  Today's TOC FU Call Status: Today's TOC FU Call Status:: Successful TOC FU Call Completed TOC FU Call Complete Date: 06/15/23 Patient's Name and Date of Birth confirmed.  Transition Care Management Follow-up Telephone Call Date of Discharge: 06/14/23 Discharge Facility: [facility]) Type of Discharge: Inpatient Admission Primary Inpatient Discharge Diagnosis:: Fall, Lactic acidosis, diarrhea due to Norovirus How have you been since you were released from the hospital?: Better Any questions or concerns?: No  Items Reviewed: Did you receive and understand the discharge instructions provided?: Yes Medications obtained,verified, and reconciled?: Yes (Medications Reviewed) Any new allergies since your discharge?: No Dietary orders reviewed?: Yes Type of Diet Ordered:: Low sodium, heart healthy Do you have support at home?: No (lives alone but does have family nearby for emergencies)  Medications Reviewed Today: Medications Reviewed Today     Reviewed by Marcos Eke, RN (Registered Nurse) on 06/15/23 at 1343  Med List Status: <None>   Medication Order Taking? Sig Documenting Provider Last Dose Status Informant  apixaban (ELIQUIS) 5 MG TABS tablet [id number] Yes Take  1 tablet   2 x / day (every 12 hours)  for Afib to Prevent Blood Clots         /       TAKE      BY      MOUTH Lucky Cowboy, MD Taking Active Self, Family Member, Pharmacy Records, Multiple Informants           Med Note Annye Rusk   Tue Jun 12, 2023 12:29 PM) Refill needed upon DC. Noted on 06/12/23  Cholecalciferol (VITAMIN D) 125 MCG (5000 UT) CAPS [id number] Yes Take 5,000 Units by mouth as directed. [provider] Taking Active Self, Family Member, Pharmacy Records, Multiple Informants  finasteride (PROSCAR) 5 MG tablet [id number] Yes Take  1 tablet  Daily  for Prostate Lucky Cowboy, MD Taking Active Self, Family Member,  Pharmacy Records, Multiple Informants           Med Note Annye Rusk   Tue Jun 12, 2023 12:31 PM) Refill needed upon DC. Noted 06/12/2023  furosemide (LASIX) 40 MG tablet [id number] Yes Take  1 tablet  Daily  for Fluid Retention / Edema Adela Glimpse, NP Taking Active   Lactobacillus (ACIDOPHILUS) CAPS capsule [id number] Yes Take 1 capsule by mouth 3 (three) times daily with meals for 14 days. Osvaldo Shipper, MD Taking Active   loperamide (IMODIUM) 2 MG capsule [id number] Yes Take 2 capsules (4 mg total) by mouth 3 (three) times daily as needed for diarrhea or loose stools. Osvaldo Shipper, MD Taking Active   magnesium gluconate (MAGONATE) 500 MG tablet [id number] Yes Take 500 mg by mouth daily. [provider] Taking Active Self, Family Member, Pharmacy Records, Multiple Informants  olmesartan (BENICAR) 20 MG tablet [id number] No Take 0.5 tablets (10 mg total) by mouth daily.  Patient not taking: Reported on 06/12/2023   Sheilah Pigeon, PA-C Not Taking Active Self, Family Member, Pharmacy Records, Multiple Informants           Med Note Cheron Schaumann, Mackie Pai   Tue Jun 12, 2023 12:27 PM) Pt reports he is not taking. Dispense records indicate a 90 day script was filled 04/2023  OVER THE COUNTER MEDICATION [id number]  1 capsule daily. Naturebell Zinc Quercitin With Vitamin C and D3 (130 mg ) [provider]  Active Self, Family Member, Pharmacy Records, Multiple Informants  predniSONE (DELTASONE) 5 MG tablet  [id number] Yes Take 1 tablet by mouth twice a day or as directed for asthma and rheumatoid arthritis Lucky Cowboy, MD Taking Active Self, Family Member, Pharmacy Records, Multiple Informants           Med Note Annye Rusk   Tue Jun 12, 2023 12:32 PM) Refill needed upon DC. Noted on 06/12/23  rosuvastatin (CRESTOR) 20 MG tablet [id number] Yes Take 1 tablet by mouth daily for cholesterol Raynelle Dick, NP Taking Active Self, Family Member, Pharmacy Records,  Multiple Informants  terazosin (HYTRIN) 10 MG capsule [id number] Yes Take  1 capsule  at Bedtime  for Prostate Lucky Cowboy, MD Taking Active Self, Family Member, Pharmacy Records, Multiple Informants            Home Care and Equipment/Supplies: Were Home Health Services Ordered?: Yes Name of Home Health Agency:: [facility],  HHPT ordered Has Agency set up a time to come to your home?: No EMR reviewed for Home Health Orders: Orders present/patient has not received call (refer to CM for follow-up) Any new equipment or medical supplies ordered?: Yes Name of Medical supply agency?: [facility] Were you able to get the equipment/medical supplies?: Yes Do you have any questions related to the use of the equipment/supplies?: No  Functional Questionnaire: Do you need assistance with bathing/showering or dressing?: No Do you need assistance with meal preparation?: No Do you need assistance with eating?: No Do you have difficulty maintaining continence: No Do you need assistance with getting out of bed/getting out of a chair/moving?: No Do you have difficulty managing or taking your medications?: No  Follow up appointments reviewed: PCP Follow-up appointment confirmed?: No (states he just got home, has PCP's number and will call - patient agreed to [facility] FU/30d program so will follow up with patient early next week for Week 2 outreach and check on/confirm PCP appt has been made, otherwise will help patient obtain) MD Provider Line Number:[phone] Given: No Specialist Hospital Follow-up appointment confirmed?: NA Do you need transportation to your follow-up appointment?: No (Patient states he is still able to drive - either he can drive himself or have a family member take him to appointments if needed) Do you understand care options if your condition(s) worsen?: Yes-patient verbalized understanding   Alyse Low, RN, BA, [facility],  CRRN [facility] Population Health Care Management Coordinator, Transition of Care Ph # [phone]

## 2023-06-17 LAB — CULTURE, BLOOD (ROUTINE X 2)
Culture: NO GROWTH
Culture: NO GROWTH
Special Requests: ADEQUATE
Special Requests: ADEQUATE

## 2023-06-20 ENCOUNTER — Other Ambulatory Visit: Payer: Self-pay

## 2023-06-20 ENCOUNTER — Telehealth: Payer: Self-pay

## 2023-06-20 DIAGNOSIS — Z1212 Encounter for screening for malignant neoplasm of rectum: Secondary | ICD-10-CM

## 2023-06-20 DIAGNOSIS — Z1211 Encounter for screening for malignant neoplasm of colon: Secondary | ICD-10-CM

## 2023-06-20 LAB — POC HEMOCCULT BLD/STL (HOME/3-CARD/SCREEN)
Card #2 Fecal Occult Blod, POC: NEGATIVE
Card #3 Fecal Occult Blood, POC: NEGATIVE
Fecal Occult Blood, POC: NEGATIVE

## 2023-06-21 NOTE — Patient Outreach (Signed)
  Care Management  Transitions of Care Program Transitions of Care Post-discharge week 2  06/20/2023 Name: [name] MRN: [id number] DOB: 11-22-1930  Subjective: [name] is a 89 y.o. year old male who is a primary care patient of Tonita Fallow, MD. The Care Management team was unable to reach the patient by phone to assess and address transitions of care needs.   Plan: Additional outreach attempts will be made to reach the patient enrolled in the [facility] Program (Post Inpatient/ED Visit).  Channing Larry, RN, BA, [facility], CRRN [facility] Coordinator, Transition of Care Ph # [phone]

## 2023-06-22 ENCOUNTER — Telehealth: Payer: Self-pay

## 2023-06-22 ENCOUNTER — Other Ambulatory Visit: Payer: Self-pay

## 2023-06-22 NOTE — Patient Instructions (Signed)
 Visit Information  Thank you for taking time to visit with me today. Please don't hesitate to contact me if I can be of assistance to you before our next scheduled telephone appointment.  Our next appointment is by telephone on 06/29/23 at 10am  Following is a copy of your care plan:   Goals Addressed             This Visit's Progress    Transition of Care       Current Barriers:  Knowledge Deficits related to plan of care for management of Atrial Fibrillation and chronic use of anticoagulation; risk for falls   RNCM Clinical Goal(s):  Patient will work with the Care Management team over the next 30 days to address Transition of Care Barriers: Medication Management Diet/Nutrition/Food Resources Support at home Provider appointments Home Health services Equipment/DME Functional/Safety Transportation Fall Precautions and Bleeding Precautions  through collaboration with Medical Illustrator, provider, and care team.   Interventions: Evaluation of current treatment plan related to  self management and patient's adherence to plan as established by provider   AFIB Interventions: (Status:  New goal.) Short Term Goal   Reviewed importance of adherence to anticoagulant exactly as prescribed Counseled on bleeding risk associated with chronic anticoagulation (on Apixaban ) and importance of self-monitoring for signs/symptoms of bleeding Counseled on avoidance of NSAIDs due to increased bleeding risk with anticoagulants Counseled on seeking medical attention after a head injury or if there is blood in the urine/stool Afib action plan reviewed Screening for signs and symptoms of depression related to chronic disease state  Assessed social determinant of health barriers   Falls Interventions:  (Status:  New goal.) Short Term Goal Reviewed medications and discussed potential side effects of medications such as dizziness and frequent urination Advised patient of importance of notifying provider  of falls Assessed for signs and symptoms of orthostatic hypotension Assessed for falls since last encounter Assessed patients knowledge of fall risk prevention secondary to previously provided education Provided patient information for fall alert systems Screening for signs and symptoms of depression related to chronic disease state  Assessed social determinant of health barriers  Patient Goals/Self-Care Activities: Participate in Transition of Care Program/Attend TOC scheduled calls Take all medications as prescribed Attend all scheduled provider appointments Call pharmacy for medication refills 3-7 days in advance of running out of medications Call provider office for new concerns or questions   Follow Up Plan:  The patient has been provided with contact information for the care management team and has been advised to call with any health related questions or concerns.          Patient verbalizes understanding of instructions and care plan provided today and agrees to view in MyChart. Active MyChart status and patient understanding of how to access instructions and care plan via MyChart confirmed with patient.     The patient has been provided with contact information for the care management team and has been advised to call with any health related questions or concerns.   Please call the care guide team at [phone] if you need to cancel or reschedule your appointment.   Please call 1-800-273-TALK (toll free, 24 hour hotline) if you are experiencing a Mental Health or Behavioral Health Crisis or need someone to talk to.  Channing Larry, RN, BA, [facility], CRRN [facility] Coordinator, Transition of Care Ph # [phone]

## 2023-06-22 NOTE — Patient Outreach (Signed)
  Care Management  Transitions of Care Program Transitions of Care Post-discharge week 2   06/22/2023 Name: [name] MRN: [id number] DOB: December 10, 1930  Subjective: [name] is a 89 y.o. year old male who is a primary care patient of Tonita Fallow, MD. The Care Management team Engaged with patient Engaged with patient by telephone to assess and address transitions of care needs.   Consent to Services:  Patient was given information about care management services, agreed to services, and gave verbal consent to participate.   Assessment:              Goals Addressed             This Visit's Progress    Transition of Care       Current Barriers:  Knowledge Deficits related to plan of care for management of Atrial Fibrillation and chronic use of anticoagulation; risk for falls   RNCM Clinical Goal(s):  Patient will work with the Care Management team over the next 30 days to address Transition of Care Barriers: Medication Management Diet/Nutrition/Food Resources Support at home Provider appointments Home Health services Equipment/DME Functional/Safety Transportation Fall Precautions and Bleeding Precautions  through collaboration with Medical Illustrator, provider, and care team.   Interventions: Evaluation of current treatment plan related to  self management and patient's adherence to plan as established by provider   AFIB Interventions: (Status:  New goal.) Short Term Goal   Reviewed importance of adherence to anticoagulant exactly as prescribed Counseled on bleeding risk associated with chronic anticoagulation (on Apixaban ) and importance of self-monitoring for signs/symptoms of bleeding Counseled on avoidance of NSAIDs due to increased bleeding risk with anticoagulants Counseled on seeking medical attention after a head injury or if there is blood in the urine/stool Afib action plan reviewed Screening for signs and symptoms of depression related to chronic  disease state  Assessed social determinant of health barriers   Falls Interventions:  (Status:  New goal.) Short Term Goal Reviewed medications and discussed potential side effects of medications such as dizziness and frequent urination Advised patient of importance of notifying provider of falls Assessed for signs and symptoms of orthostatic hypotension Assessed for falls since last encounter Assessed patients knowledge of fall risk prevention secondary to previously provided education Provided patient information for fall alert systems Screening for signs and symptoms of depression related to chronic disease state  Assessed social determinant of health barriers  Patient Goals/Self-Care Activities: Participate in Transition of Care Program/Attend TOC scheduled calls Take all medications as prescribed Attend all scheduled provider appointments Call pharmacy for medication refills 3-7 days in advance of running out of medications Call provider office for new concerns or questions   Follow Up Plan:  The patient has been provided with contact information for the care management team and has been advised to call with any health related questions or concerns.          Plan: The patient has been provided with contact information for the care management team and has been advised to call with any health related questions or concerns.   Channing Larry, RN, BA, [facility], CRRN [facility] Coordinator, Transition of Care Ph # [phone]

## 2023-06-29 ENCOUNTER — Telehealth: Payer: Self-pay

## 2023-06-29 ENCOUNTER — Other Ambulatory Visit: Payer: Self-pay

## 2023-06-29 NOTE — Patient Outreach (Signed)
  Care Management  Transitions of Care Program Transitions of Care Post-discharge week 3   06/29/2023 Name: [name] MRN: [id number] DOB: [birth year]  Subjective: [name] is a 89 y.o. year old male who is a primary care patient of Lucky Cowboy, MD. The Care Management team Engaged with patient Engaged with patient by telephone to assess and address transitions of care needs.   Consent to Services:  Patient was given information about care management services, agreed to services, and gave verbal consent to participate.   Assessment:              Goals Addressed             This Visit's Progress    Transition of Care       Current Barriers:  Knowledge Deficits related to plan of care for management of Atrial Fibrillation and chronic use of anticoagulation; risk for falls   RNCM Clinical Goal(s):  Patient will work with the Care Management team over the next 30 days to address Transition of Care Barriers: Medication Management Diet/Nutrition/Food Resources Support at home Provider appointments Home Health services Equipment/DME Functional/Safety Transportation Fall Precautions and Bleeding Precautions  through collaboration with Medical illustrator, provider, and care team.   Interventions: Evaluation of current treatment plan related to  self management and patient's adherence to plan as established by provider   AFIB Interventions: (Status:  Ongoing) Short Term Goal   [Discussed 06/22/23, and 06/29/23] Reviewed importance of adherence to anticoagulant exactly as prescribed Counseled on bleeding risk associated with chronic anticoagulation (on Apixaban) and importance of self-monitoring for signs/symptoms of bleeding Counseled on avoidance of NSAIDs due to increased bleeding risk with anticoagulants Counseled on seeking medical attention after a head injury or if there is blood in the urine/stool Afib action plan reviewed Screening for signs and symptoms of  depression related to chronic disease state  Assessed social determinant of health barriers   Falls Interventions:  (Status:  Ongoing) Short Term Goal [Discussed/reviewed meds with potential side effects pointed out 06/22/23, 06/29/23] Reviewed medications and discussed potential side effects of medications such as dizziness and frequent urination Advised patient of importance of notifying provider of falls Assessed for signs and symptoms of orthostatic hypotension Assessed for falls since last encounter Assessed patients knowledge of fall risk prevention secondary to previously provided education Provided patient information for fall alert systems Screening for signs and symptoms of depression related to chronic disease state  Assessed social determinant of health barriers  Patient Goals/Self-Care Activities: Participate in Transition of Care Program/Attend TOC scheduled calls Take all medications as prescribed Attend all scheduled provider appointments Call pharmacy for medication refills 3-7 days in advance of running out of medications Call provider office for new concerns or questions   Follow Up Plan:  The patient has been provided with contact information for the care management team and has been advised to call with any health related questions or concerns.          Plan: The patient has been provided with contact information for the care management team and has been advised to call with any health related questions or concerns.   Alyse Low, RN, BA, [facility], CRRN [facility] Coordinator, Transition of Care Ph # [phone]

## 2023-06-29 NOTE — Patient Instructions (Signed)
Visit Information  Dear Mr. [name],  Thank you for taking time to visit with me today and I'm glad to hear you are doing so well since your discharge from [facility] on 06/14/23. Please don't hesitate to contact me if I can be of assistance to you before our next scheduled telephone appointment.  Our next appointment is by telephone on Friday, 07/06/23 at 10am.  Following is a copy of your care plan:   Goals Addressed             This Visit's Progress    Transition of Care       Current Barriers:  Knowledge Deficits related to plan of care for management of Atrial Fibrillation and chronic use of anticoagulation; risk for falls   RNCM Clinical Goal(s):  Patient will work with the Care Management team over the next 30 days to address Transition of Care Barriers: Medication Management Diet/Nutrition/Food Resources Support at home Provider appointments Home Health services Equipment/DME Functional/Safety Transportation Fall Precautions and Bleeding Precautions  through collaboration with Medical illustrator, provider, and care team.   Interventions: Evaluation of current treatment plan related to  self management and patient's adherence to plan as established by provider   AFIB Interventions: (Status:  Ongoing) Short Term Goal   [Discussed 06/22/23, and 06/29/23] Reviewed importance of adherence to anticoagulant exactly as prescribed Counseled on bleeding risk associated with chronic anticoagulation (on Apixaban) and importance of self-monitoring for signs/symptoms of bleeding Counseled on avoidance of NSAIDs due to increased bleeding risk with anticoagulants Counseled on seeking medical attention after a head injury or if there is blood in the urine/stool Afib action plan reviewed Screening for signs and symptoms of depression related to chronic disease state  Assessed social determinant of health barriers   Falls Interventions:  (Status:  Ongoing) Short Term  Goal [Discussed/reviewed meds with potential side effects pointed out 06/22/23, 06/29/23] Reviewed medications and discussed potential side effects of medications such as dizziness and frequent urination Advised patient of importance of notifying provider of falls Assessed for signs and symptoms of orthostatic hypotension Assessed for falls since last encounter Assessed patients knowledge of fall risk prevention secondary to previously provided education Provided patient information for fall alert systems Screening for signs and symptoms of depression related to chronic disease state  Assessed social determinant of health barriers  Patient Goals/Self-Care Activities: Participate in Transition of Care Program/Attend TOC scheduled calls Take all medications as prescribed Attend all scheduled provider appointments Call pharmacy for medication refills 3-7 days in advance of running out of medications Call provider office for new concerns or questions   Follow Up Plan:  The patient has been provided with contact information for the care management team and has been advised to call with any health related questions or concerns.          Patient verbalizes understanding of instructions and care plan provided today and agrees to view in MyChart. Active MyChart status and patient understanding of how to access instructions and care plan via MyChart confirmed with patient.     The patient has been provided with contact information for the care management team and has been advised to call with any health related questions or concerns.   Please call the care guide team at [phone] if you need to cancel or reschedule your appointment.   Please call 1-800-273-TALK (toll free, 24 hour hotline) if you are experiencing a Mental Health or Behavioral Health Crisis or need someone to talk to.  Alyse Low, RN, BA, [facility], CRRN [facility] Coordinator, Transition of Care Ph  # [phone]

## 2023-07-02 ENCOUNTER — Ambulatory Visit (INDEPENDENT_AMBULATORY_CARE_PROVIDER_SITE_OTHER): Payer: PPO

## 2023-07-02 DIAGNOSIS — I442 Atrioventricular block, complete: Secondary | ICD-10-CM | POA: Diagnosis not present

## 2023-07-02 LAB — CUP PACEART REMOTE DEVICE CHECK
Battery Remaining Longevity: 94 mo
Battery Voltage: 3 V
Brady Statistic AP VP Percent: 33.58 %
Brady Statistic AP VS Percent: 0.12 %
Brady Statistic AS VP Percent: 63.84 %
Brady Statistic AS VS Percent: 2.47 %
Brady Statistic RA Percent Paced: 33.98 %
Brady Statistic RV Percent Paced: 97.41 %
Date Time Interrogation Session: 20250217012218
Implantable Lead Connection Status: 753985
Implantable Lead Connection Status: 753985
Implantable Lead Implant Date: 20230219
Implantable Lead Implant Date: 20230219
Implantable Lead Location: 753859
Implantable Lead Location: 753860
Implantable Lead Model: 3830
Implantable Lead Model: 5076
Implantable Pulse Generator Implant Date: 20230219
Lead Channel Impedance Value: 323 Ohm
Lead Channel Impedance Value: 361 Ohm
Lead Channel Impedance Value: 418 Ohm
Lead Channel Impedance Value: 456 Ohm
Lead Channel Pacing Threshold Amplitude: 0.625 V
Lead Channel Pacing Threshold Amplitude: 1.625 V
Lead Channel Pacing Threshold Pulse Width: 0.4 ms
Lead Channel Pacing Threshold Pulse Width: 0.4 ms
Lead Channel Sensing Intrinsic Amplitude: 3.5 mV
Lead Channel Sensing Intrinsic Amplitude: 3.5 mV
Lead Channel Sensing Intrinsic Amplitude: 9.125 mV
Lead Channel Sensing Intrinsic Amplitude: 9.125 mV
Lead Channel Setting Pacing Amplitude: 1.5 V
Lead Channel Setting Pacing Amplitude: 3.25 V
Lead Channel Setting Pacing Pulse Width: 0.4 ms
Lead Channel Setting Sensing Sensitivity: 1.2 mV
Zone Setting Status: 755011

## 2023-07-04 ENCOUNTER — Encounter: Payer: Self-pay | Admitting: Cardiology

## 2023-07-06 ENCOUNTER — Other Ambulatory Visit: Payer: Self-pay

## 2023-07-06 ENCOUNTER — Telehealth: Payer: Self-pay

## 2023-07-06 DIAGNOSIS — I480 Paroxysmal atrial fibrillation: Secondary | ICD-10-CM

## 2023-07-06 DIAGNOSIS — Z9181 History of falling: Secondary | ICD-10-CM

## 2023-07-06 DIAGNOSIS — U071 COVID-19: Secondary | ICD-10-CM

## 2023-07-06 DIAGNOSIS — J441 Chronic obstructive pulmonary disease with (acute) exacerbation: Secondary | ICD-10-CM

## 2023-07-06 NOTE — Patient Outreach (Signed)
Care Management  Transitions of Care Program Transitions of Care Post-discharge week 4   07/06/2023 Name: [name] MRN: [id number] DOB: 03/03/1931  Subjective: [name] is a 89 y.o. year old male who is a primary care patient of Lucky Cowboy, MD. The Care Management team Engaged with patient Engaged with patient by telephone to assess and address transitions of care needs.   Consent to Services:  Patient was given information about care management services, agreed to services, and gave verbal consent to participate.   Assessment:              Goals Addressed             This Visit's Progress    Transition of Care       Current Barriers:  Knowledge Deficits related to plan of care for management of Atrial Fibrillation and chronic use of anticoagulation; risk for falls   RNCM Clinical Goal(s):  Patient will work with the Care Management team over the next 30 days to address Transition of Care Barriers: Medication Management Diet/Nutrition/Food Resources Support at home Provider appointments Home Health services Equipment/DME Functional/Safety Transportation Fall Precautions and Bleeding Precautions  through collaboration with Medical illustrator, provider, and care team.   Interventions: Evaluation of current treatment plan related to  self management and patient's adherence to plan as established by provider   AFIB Interventions: (Status:  Ongoing) Short Term Goal   [Discussed 06/22/23, 06/29/23], 07/06/23] Reviewed importance of adherence to anticoagulant exactly as prescribed Counseled on bleeding risk associated with chronic anticoagulation (on Apixaban) and importance of self-monitoring for signs/symptoms of bleeding Counseled on avoidance of NSAIDs due to increased bleeding risk with anticoagulants Counseled on seeking medical attention after a head injury or if there is blood in the urine/stool Afib action plan reviewed Screening for signs and symptoms of  depression related to chronic disease state  Assessed social determinant of health barriers   Falls Interventions:  (Status:  Ongoing) Short Term Goal [Discussed/reviewed meds with potential side effects pointed out 06/22/23, 06/29/23, 07/06/23] Reviewed medications and discussed potential side effects of medications such as dizziness and frequent urination Advised patient of importance of notifying provider of falls Assessed for signs and symptoms of orthostatic hypotension Assessed for falls since last encounter Assessed patients knowledge of fall risk prevention secondary to previously provided education Provided patient information for fall alert systems Screening for signs and symptoms of depression related to chronic disease state  Assessed social determinant of health barriers  Patient Goals/Self-Care Activities: Participate in Transition of Care Program/Attend TOC scheduled calls Take all medications as prescribed Attend all scheduled provider appointments Call pharmacy for medication refills 3-7 days in advance of running out of medications Call provider office for new concerns or questions   Follow Up Plan:  The patient has been provided with contact information for the care management team and has been advised to call with any health related questions or concerns.          Plan: The patient has been provided with contact information for the care management team and has been advised to call with any health related questions or concerns.   Branon Sabine A. Mliss Fritz RN, BA, [facility], CRRN [facility] RN Care Manager, Transition of Care [phone]

## 2023-07-06 NOTE — Patient Instructions (Addendum)
Visit Information  Thank you for taking time to visit with me today. This was our last Transition of Care/post-hospitalization telephone visit, and truly, it has been an Systems developer and privilege  working with you.   I wish you all the best on your continued management of your own health which you have been doing admirably over the last several decades! Please don't hesitate to contact me if I can be of assistance to you in the future,  Warm Regards,  Elnita Maxwell    Following is a copy of your care plan:   Goals Addressed             This Visit's Progress    Transition of Care       Current Barriers:  Knowledge Deficits related to plan of care for management of Atrial Fibrillation and chronic use of anticoagulation; risk for falls   RNCM Clinical Goal(s):  Patient will work with the Care Management team over the next 30 days to address Transition of Care Barriers: Medication Management Diet/Nutrition/Food Resources Support at home Provider appointments Home Health services Equipment/DME Functional/Safety Transportation Fall Precautions and Bleeding Precautions  through collaboration with Medical illustrator, provider, and care team.   Interventions: Evaluation of current treatment plan related to  self management and patient's adherence to plan as established by provider   AFIB Interventions: (Status:  Ongoing) Short Term Goal   [Discussed 06/22/23, 06/29/23], 07/06/23] Reviewed importance of adherence to anticoagulant exactly as prescribed Counseled on bleeding risk associated with chronic anticoagulation (on Apixaban) and importance of self-monitoring for signs/symptoms of bleeding Counseled on avoidance of NSAIDs due to increased bleeding risk with anticoagulants Counseled on seeking medical attention after a head injury or if there is blood in the urine/stool Afib action plan reviewed Screening for signs and symptoms of depression related to chronic disease state  Assessed social  determinant of health barriers   Falls Interventions:  (Status:  Ongoing) Short Term Goal [Discussed/reviewed meds with potential side effects pointed out 06/22/23, 06/29/23, 07/06/23] Reviewed medications and discussed potential side effects of medications such as dizziness and frequent urination Advised patient of importance of notifying provider of falls Assessed for signs and symptoms of orthostatic hypotension Assessed for falls since last encounter Assessed patients knowledge of fall risk prevention secondary to previously provided education Provided patient information for fall alert systems Screening for signs and symptoms of depression related to chronic disease state  Assessed social determinant of health barriers  Patient Goals/Self-Care Activities: Participate in Transition of Care Program/Attend TOC scheduled calls Take all medications as prescribed Attend all scheduled provider appointments Call pharmacy for medication refills 3-7 days in advance of running out of medications Call provider office for new concerns or questions   Follow Up Plan:  The patient has been provided with contact information for the care management team and has been advised to call with any health related questions or concerns.          Patient verbalizes understanding of instructions and care plan provided today and agrees to view in MyChart. Active MyChart status and patient understanding of how to access instructions and care plan via MyChart confirmed with patient.     The patient has been provided with contact information for the care management team and has been advised to call with any health related questions or concerns.   Please call 1-800-273-TALK (toll free, 24 hour hotline) if you are experiencing a Mental Health or Behavioral Health Crisis or need someone to talk to.  Alyse Low, RN, BA, [facility], CRRN [facility] Coordinator, Transition of Care Ph # [phone]

## 2023-07-12 ENCOUNTER — Telehealth: Payer: Self-pay | Admitting: *Deleted

## 2023-07-12 NOTE — Progress Notes (Signed)
 Complex Care Management Note  Care Guide Note 07/12/2023 Name: ARHAM SYMMONDS MRN: [id number] DOB: [birth year]  BURT PIATEK is a 89 y.o. year old male who sees Lucky Cowboy, MD for primary care. I reached out to [name] by phone today to offer complex care management services.  Mr. Ortwein was given information about Complex Care Management services today including:   The Complex Care Management services include support from the care team which includes your Nurse Care Manager, Clinical Social Worker, or Pharmacist.  The Complex Care Management team is here to help remove barriers to the health concerns and goals most important to you. Complex Care Management services are voluntary, and the patient may decline or stop services at any time by request to their care team member.   Complex Care Management Consent Status: Patient agreed to services and verbal consent obtained.   Follow up plan:  Telephone appointment with complex care management team member scheduled for:  3/11  Encounter Outcome:  Patient Scheduled  Gwenevere Ghazi  [facility] Health  [facility], [facility] Guide  Direct Dial: [phone]  Fax [phone]

## 2023-07-16 DIAGNOSIS — C44319 Basal cell carcinoma of skin of other parts of face: Secondary | ICD-10-CM | POA: Diagnosis not present

## 2023-07-16 DIAGNOSIS — D0439 Carcinoma in situ of skin of other parts of face: Secondary | ICD-10-CM | POA: Diagnosis not present

## 2023-07-24 ENCOUNTER — Ambulatory Visit: Payer: Self-pay

## 2023-07-24 NOTE — Patient Outreach (Signed)
 Care Coordination   Initial Visit Note   07/24/2023 Name: NICKOLAOS BRALLIER MRN: [id number] DOB: [birth year]  TRAESON DUSZA is a 89 y.o. year old male who sees Lucky Cowboy, MD for primary care. I spoke with  [name] by phone today.  What matters to the patients health and wellness today?  Mr. Hanley Woerner is a 89 year old male who resides independently in his home. During our discussion, he indicated that he is feeling well. We conducted a comprehensive review of his medical information, including his medications and allergies. Mr. Loppnow noted that he continues to drive and is able to attend his scheduled appointments. He expressed that he has few concerns; however, he would appreciate it if I could make regular phone calls to check on his well-being.   We addressed his atrial fibrillation, and he reported that he does not experience any complications related to this condition. He also mentioned that he possesses a monitoring device at home to track his health. Mr. Benincasa is compliant with his medication regimen, and I will follow up with him to assess his ongoing progress.     Goals Addressed             This Visit's Progress    Patient Stated- I will monitor my Afib       Patient Goals/Self Care Activities: -Patient/Caregiver will take medications as prescribed   -Patient/Caregiver will attend all scheduled provider appointments -Patient/Caregiver will call pharmacy for medication refills 3-7 days in advance of running out of medications -Patient/Caregiver will call provider office for new concerns or questions  -Patient/Caregiver will focus on medication adherence by taking medications as prescribed  -Take the medications prescribed to control your heart rate and rhythm and reduce your risk of blood clot formation (anticoagulants or antiplatelet medications/"blood thinners"). -Avoid smoking, alcohol, and illicit drug use. -Learn to check your pulse and write down the number every  day. -Record your zone each day and know your atrial fibrillation (A-fib) Action Plan           SDOH assessments and interventions completed:  Yes  SDOH Interventions Today    Flowsheet Row Most Recent Value  SDOH Interventions   Food Insecurity Interventions Intervention Not Indicated  Housing Interventions Intervention Not Indicated  Transportation Interventions Intervention Not Indicated  Utilities Interventions Intervention Not Indicated        Care Coordination Interventions:  Yes, provided   Interventions Today    Flowsheet Row Most Recent Value  Chronic Disease   Chronic disease during today's visit Atrial Fibrillation (AFib)  General Interventions   General Interventions Discussed/Reviewed General Interventions Discussed, General Interventions Reviewed  Pharmacy Interventions   Pharmacy Dicussed/Reviewed Pharmacy Topics Discussed  Safety Interventions   Safety Discussed/Reviewed Safety Discussed        Follow up plan: Follow up call scheduled for 08/14/23  230 pm    Encounter Outcome:  Patient Visit Completed   Juanell Fairly RN, BSN, [facility] [place]  [facility], [facility]  Care Coordinator Phone: [phone]

## 2023-07-24 NOTE — Patient Instructions (Signed)
 Visit Information  Thank you for taking time to visit with me today. Please don't hesitate to contact me if I can be of assistance to you.   Following are the goals we discussed today:   Goals Addressed             This Visit's Progress    Patient Stated- I will monitor my Afib       Patient Goals/Self Care Activities: -Patient/Caregiver will take medications as prescribed   -Patient/Caregiver will attend all scheduled provider appointments -Patient/Caregiver will call pharmacy for medication refills 3-7 days in advance of running out of medications -Patient/Caregiver will call provider office for new concerns or questions  -Patient/Caregiver will focus on medication adherence by taking medications as prescribed  -Take the medications prescribed to control your heart rate and rhythm and reduce your risk of blood clot formation (anticoagulants or antiplatelet medications/"blood thinners"). -Avoid smoking, alcohol, and illicit drug use. -Learn to check your pulse and write down the number every day. -Record your zone each day and know your atrial fibrillation (A-fib) Action Plan           Our next appointment is by telephone on 08/14/23 at 230 pm  Please call the care guide team at [phone] if you need to cancel or reschedule your appointment.   If you are experiencing a Mental Health or Behavioral Health Crisis or need someone to talk to, please call 1-800-273-TALK (toll free, 24 hour hotline)  Patient verbalizes understanding of instructions and care plan provided today and agrees to view in MyChart. Active MyChart status and patient understanding of how to access instructions and care plan via MyChart confirmed with patient.     Juanell Fairly RN, BSN, [facility] [place]  [facility], [facility]  Care Coordinator Phone: [phone]

## 2023-08-03 DIAGNOSIS — I129 Hypertensive chronic kidney disease with stage 1 through stage 4 chronic kidney disease, or unspecified chronic kidney disease: Secondary | ICD-10-CM | POA: Diagnosis not present

## 2023-08-03 DIAGNOSIS — E2749 Other adrenocortical insufficiency: Secondary | ICD-10-CM | POA: Diagnosis not present

## 2023-08-03 DIAGNOSIS — E538 Deficiency of other specified B group vitamins: Secondary | ICD-10-CM | POA: Diagnosis not present

## 2023-08-03 DIAGNOSIS — Z85820 Personal history of malignant melanoma of skin: Secondary | ICD-10-CM | POA: Diagnosis not present

## 2023-08-03 DIAGNOSIS — R5382 Chronic fatigue, unspecified: Secondary | ICD-10-CM | POA: Diagnosis not present

## 2023-08-03 DIAGNOSIS — E7800 Pure hypercholesterolemia, unspecified: Secondary | ICD-10-CM | POA: Diagnosis not present

## 2023-08-03 DIAGNOSIS — N1831 Chronic kidney disease, stage 3a: Secondary | ICD-10-CM | POA: Diagnosis not present

## 2023-08-07 NOTE — Progress Notes (Signed)
 Remote pacemaker transmission.

## 2023-08-14 ENCOUNTER — Ambulatory Visit: Payer: PPO | Admitting: Nurse Practitioner

## 2023-08-14 ENCOUNTER — Telehealth: Payer: Self-pay

## 2023-08-14 NOTE — Patient Outreach (Signed)
 Care Coordination   08/14/2023 Name: [name] MRN: [id number] DOB: 1931/04/26   Care Coordination Outreach Attempts:  An unsuccessful telephone outreach was attempted today to offer the patient information about available complex care management services.  Follow Up Plan:  Additional outreach attempts will be made to offer the patient complex care management information and services.   Encounter Outcome:  No Answer   Care Coordination Interventions:  No, not indicated    Lonzo Candy, BSN, [facility] [place]  [facility], [facility] Health  Care Coordinator Phone: [phone]

## 2023-09-10 NOTE — Progress Notes (Unsigned)
 Cardiology Office Note:  .   Date:  09/10/2023  ID:  [name], DOB [birth year], MRN [id number] PCP: [name] Genet, MD  [facility] Providers Cardiologist:  None Electrophysiologist:  [name] Byes, MD {  History of Present Illness: .   [name] is a 89 y.o. male w/PMHx of RBBB, HTN, RA, asthma/COPD, DJD, GERD, symptomatic brady w/PPM, AFib  He saw Dr. Marven [name] May 2023, doing well, stable home weights, back pain limiting him, no changes made.  He saw AFib clinic Dec 2023, brief episode, started on OAC, no AAD  I saw him 03/20/23 He is accompanied by his daughter (a Charity fundraiser) The pt reports doing well, turns 89 in a few days! His daughter reports that late September she noticed (via [employer]) that he had not gotten up and gone to [employer] like he does usually without fail for breakfast. She went to check on him and he was a bit lethargic, his BP 70's/40' He did not want her to call 911, and was able to eventually bget him to the PMD BP better but still low. As it turned out he was taking his olmesart and AND an old losartan  rx as well. Both were held and eventually his BP normalized and then high again towars 150's or so. His olmesartan  resumed His PMD preferred that we manage his BP Otherwise no cardiac c/o or concerns Advised to hold his ARB for SBP < 110 Noted his paced QRS morphology/duration better paced (LB position) MVP off AF burden zero  I saw him 05/23/23 He is accompanied by his daughter Doing well BPs generally 110's/50s, a few [name] and higher He denies any dizzy spells, weak spells, no near syncope or syncope No CP, SOB No bleeding/signs of bleeding Just saw his PMD, labs/EKG, exam > reduced his lasix  to every other day AFib burden was zero% No changes were made  Hospitalized Jan 28-30 with novovirus  Today's visit is scheduled as a 3 mo f/u ROS:   *** AF burden *** eliquis , dose, bleeding, labs *** low BPs? *** [employer] ***    Device information MDT dual chamber PPM implanted 07/03/2021 (loop removed)  Arrhythmia/AAD hx Found vis device Dec 2023  Studies Reviewed: [name]    EKG not done today  DEVICE interrogation done today and reviewed by myself *** Battery and lead measurements are good ***  07/03/2021: TTE 1. Left ventricular ejection fraction, by estimation, is 60 to 65%. The  left ventricle has normal function. The left ventricle has no regional  wall motion abnormalities. There is mild concentric left ventricular  hypertrophy. Left ventricular diastolic  parameters are consistent with Grade I diastolic dysfunction (impaired  relaxation).   2. Right ventricular systolic function is normal. The right ventricular  size is normal. Tricuspid regurgitation signal is inadequate for assessing  PA pressure.   3. The mitral valve is grossly normal. No evidence of mitral valve  regurgitation. No evidence of mitral stenosis.   4. The aortic valve is tricuspid. Aortic valve regurgitation is not  visualized. No aortic stenosis is present.     Risk Assessment/Calculations:    Physical Exam:   VS:  There were no vitals taken for this visit.   Wt Readings from Last 3 Encounters:  06/12/23 239 lb (108.4 kg)  05/23/23 239 lb (108.4 kg)  05/15/23 269 lb 3.2 oz (122.1 kg)    GEN: Well nourished, well developed in no acute distress NECK: No JVD; No carotid bruits CARDIAC: ***  RRR, no murmurs, rubs, gallops RESPIRATORY: *** CTA b/l without rales, wheezing or rhonchi  ABDOMEN: Soft, non-tender, non-distended EXTREMITIES: *** No edema; No deformity   *** PPM site: is stable, no thinning, fluctuation, tethering  ASSESSMENT AND PLAN: .    PPM *** Intact function *** No programming changes  Paroxysmal AFib CHA2DS2Vasc is 4, on eliquis , *** appropriately dosed *** % burden  HTN Relative low BPs ***  Secondary hypercoagulable state 2/2 AFib    Dispo: back in 6 mo, sooner if  needed  Signed, Debbie Fails, PA-C

## 2023-09-11 ENCOUNTER — Encounter: Payer: Self-pay | Admitting: Physician Assistant

## 2023-09-11 ENCOUNTER — Ambulatory Visit: Payer: PPO | Attending: Physician Assistant | Admitting: Physician Assistant

## 2023-09-11 VITALS — BP 100/60 | HR 89 | Ht 76.0 in | Wt 269.5 lb

## 2023-09-11 DIAGNOSIS — I480 Paroxysmal atrial fibrillation: Secondary | ICD-10-CM | POA: Diagnosis not present

## 2023-09-11 DIAGNOSIS — R609 Edema, unspecified: Secondary | ICD-10-CM

## 2023-09-11 DIAGNOSIS — D6869 Other thrombophilia: Secondary | ICD-10-CM | POA: Diagnosis not present

## 2023-09-11 DIAGNOSIS — Z79899 Other long term (current) drug therapy: Secondary | ICD-10-CM | POA: Diagnosis not present

## 2023-09-11 DIAGNOSIS — Z950 Presence of cardiac pacemaker: Secondary | ICD-10-CM | POA: Diagnosis not present

## 2023-09-11 LAB — CUP PACEART INCLINIC DEVICE CHECK
Battery Remaining Longevity: 105 mo
Battery Voltage: 3 V
Brady Statistic AP VP Percent: 24.88 %
Brady Statistic AP VS Percent: 0.07 %
Brady Statistic AS VP Percent: 73.01 %
Brady Statistic AS VS Percent: 2.03 %
Brady Statistic RA Percent Paced: 25.38 %
Brady Statistic RV Percent Paced: 97.89 %
Date Time Interrogation Session: 20250429165111
Implantable Lead Connection Status: 753985
Implantable Lead Connection Status: 753985
Implantable Lead Implant Date: 20230219
Implantable Lead Implant Date: 20230219
Implantable Lead Location: 753859
Implantable Lead Location: 753860
Implantable Lead Model: 3830
Implantable Lead Model: 5076
Implantable Pulse Generator Implant Date: 20230219
Lead Channel Impedance Value: 342 Ohm
Lead Channel Impedance Value: 380 Ohm
Lead Channel Impedance Value: 399 Ohm
Lead Channel Impedance Value: 513 Ohm
Lead Channel Pacing Threshold Amplitude: 0.5 V
Lead Channel Pacing Threshold Amplitude: 1.25 V
Lead Channel Pacing Threshold Pulse Width: 0.4 ms
Lead Channel Pacing Threshold Pulse Width: 0.4 ms
Lead Channel Sensing Intrinsic Amplitude: 12.375 mV
Lead Channel Sensing Intrinsic Amplitude: 13.125 mV
Lead Channel Sensing Intrinsic Amplitude: 4.125 mV
Lead Channel Sensing Intrinsic Amplitude: 5 mV
Lead Channel Setting Pacing Amplitude: 1.5 V
Lead Channel Setting Pacing Amplitude: 2.75 V
Lead Channel Setting Pacing Pulse Width: 0.4 ms
Lead Channel Setting Sensing Sensitivity: 1.2 mV
Zone Setting Status: 755011

## 2023-09-11 NOTE — Patient Instructions (Addendum)
 Medication Instructions:   Your physician recommends that you continue on your current medications as directed. Please refer to the Current Medication list given to you today.   *If you need a refill on your cardiac medications before your next appointment, please call your pharmacy*   Lab Work:  PLEASE GO DOWN STAIRS  [facility]  FIRST FLOOR  [address] ( GET OFF ELEVATORS MAKE A LEFT AND ANOTHER LEFT LAB ON RIGHT DOWN HALLWAY : BMET TODAY      If you have labs (blood work) drawn today and your tests are completely normal, you will receive your results only by: MyChart Message (if you have MyChart) OR A paper copy in the mail If you have any lab test that is abnormal or we need to change your treatment, we will call you to review the results.   Testing/Procedures: NONE ORDERED  TODAY     Follow-Up: At [facility], you and your health needs are our priority.  As part of our continuing mission to provide you with exceptional heart care, our providers are all part of one team.  This team includes your primary Cardiologist (physician) and Advanced Practice Providers or APPs (Physician Assistants and Nurse Practitioners) who all work together to provide you with the care you need, when you need it.  Your next appointment:     6 month(s)   Provider:    You may see Boyce Byes, MD or one of the following Advanced Practice Providers on your designated Care Team:   Mertha Abrahams, [place]    We recommend signing up for the patient portal called "MyChart".  Sign up information is provided on this After Visit Summary.  MyChart is used to connect with patients for Virtual Visits (Telemedicine).  Patients are able to view lab/test results, encounter notes, upcoming appointments, etc.  Non-urgent messages can be sent to your provider as well.   To learn more about what you can do with MyChart, go to [email or web address].   Other Instructions

## 2023-09-12 LAB — BASIC METABOLIC PANEL WITH GFR
BUN/Creatinine Ratio: 15 (ref 10–24)
BUN: 19 mg/dL (ref 10–36)
CO2: 25 mmol/L (ref 20–29)
Calcium: 8.9 mg/dL (ref 8.6–10.2)
Chloride: 99 mmol/L (ref 96–106)
Creatinine, Ser: 1.24 mg/dL (ref 0.76–1.27)
Glucose: 141 mg/dL — ABNORMAL HIGH (ref 70–99)
Potassium: 4.4 mmol/L (ref 3.5–5.2)
Sodium: 139 mmol/L (ref 134–144)
eGFR: 55 mL/min/{1.73_m2} — ABNORMAL LOW (ref >59)

## 2023-09-16 ENCOUNTER — Encounter: Payer: Self-pay | Admitting: Cardiology

## 2023-10-01 ENCOUNTER — Ambulatory Visit (INDEPENDENT_AMBULATORY_CARE_PROVIDER_SITE_OTHER): Payer: PPO

## 2023-10-01 DIAGNOSIS — H0102A Squamous blepharitis right eye, upper and lower eyelids: Secondary | ICD-10-CM | POA: Diagnosis not present

## 2023-10-01 DIAGNOSIS — H02532 Eyelid retraction right lower eyelid: Secondary | ICD-10-CM | POA: Diagnosis not present

## 2023-10-01 DIAGNOSIS — H04123 Dry eye syndrome of bilateral lacrimal glands: Secondary | ICD-10-CM | POA: Diagnosis not present

## 2023-10-01 DIAGNOSIS — I442 Atrioventricular block, complete: Secondary | ICD-10-CM

## 2023-10-01 DIAGNOSIS — H02535 Eyelid retraction left lower eyelid: Secondary | ICD-10-CM | POA: Diagnosis not present

## 2023-10-01 DIAGNOSIS — R55 Syncope and collapse: Secondary | ICD-10-CM

## 2023-10-01 DIAGNOSIS — H0102B Squamous blepharitis left eye, upper and lower eyelids: Secondary | ICD-10-CM | POA: Diagnosis not present

## 2023-10-02 ENCOUNTER — Ambulatory Visit: Payer: Self-pay | Admitting: Cardiology

## 2023-10-02 LAB — CUP PACEART REMOTE DEVICE CHECK
Battery Remaining Longevity: 100 mo
Battery Voltage: 3 V
Brady Statistic AP VP Percent: 21.31 %
Brady Statistic AP VS Percent: 0.06 %
Brady Statistic AS VP Percent: 76.83 %
Brady Statistic AS VS Percent: 1.8 %
Brady Statistic RA Percent Paced: 21.68 %
Brady Statistic RV Percent Paced: 98.14 %
Date Time Interrogation Session: 20250518191626
Implantable Lead Connection Status: 753985
Implantable Lead Connection Status: 753985
Implantable Lead Implant Date: 20230219
Implantable Lead Implant Date: 20230219
Implantable Lead Location: 753859
Implantable Lead Location: 753860
Implantable Lead Model: 3830
Implantable Lead Model: 5076
Implantable Pulse Generator Implant Date: 20230219
Lead Channel Impedance Value: 304 Ohm
Lead Channel Impedance Value: 342 Ohm
Lead Channel Impedance Value: 361 Ohm
Lead Channel Impedance Value: 456 Ohm
Lead Channel Pacing Threshold Amplitude: 0.625 V
Lead Channel Pacing Threshold Amplitude: 1.375 V
Lead Channel Pacing Threshold Pulse Width: 0.4 ms
Lead Channel Pacing Threshold Pulse Width: 0.4 ms
Lead Channel Sensing Intrinsic Amplitude: 11.125 mV
Lead Channel Sensing Intrinsic Amplitude: 11.125 mV
Lead Channel Sensing Intrinsic Amplitude: 5.25 mV
Lead Channel Sensing Intrinsic Amplitude: 5.25 mV
Lead Channel Setting Pacing Amplitude: 1.5 V
Lead Channel Setting Pacing Amplitude: 2.75 V
Lead Channel Setting Pacing Pulse Width: 0.4 ms
Lead Channel Setting Sensing Sensitivity: 1.2 mV
Zone Setting Status: 755011

## 2023-10-10 ENCOUNTER — Other Ambulatory Visit: Payer: Self-pay

## 2023-10-10 NOTE — Patient Instructions (Signed)
 Visit Information  Thank you for taking time to visit with me today. Please don't hesitate to contact me if I can be of assistance to you before our next scheduled appointment.  Your next care management appointment is  scheduled for:  11/09/23  245 pm   Please call the care guide team at [phone] if you need to cancel, schedule, or reschedule an appointment.   Please call 1-800-273-TALK (toll free, 24 hour hotline) if you are experiencing a Mental Health or Behavioral Health Crisis or need someone to talk to.  Augustin Leber RN, BSN, [facility] [address]  [facility], [facility]  Care Coordinator Phone: [phone]

## 2023-10-10 NOTE — Patient Outreach (Signed)
 Complex Care Management   Visit Note  10/10/2023  Name:  [name] MRN: [id number] DOB: Dec 03, 1930  Situation: Referral received for Complex Care Management related to Atrial Fibrillation I obtained verbal consent from Patient.  Visit completed with patient  on the phone  Background:   Past Medical History:  Diagnosis Date   Allergy    Asthma    BPH (benign prostatic hypertrophy)    BPH (benign prostatic hypertrophy)    COPD (chronic obstructive pulmonary disease) (HCC)    DJD (degenerative joint disease)    GERD (gastroesophageal reflux disease)    History of urinary retention    Hypertension    IBS (irritable bowel syndrome)    RBBB    Rheumatoid arthritis(714.0)    DR. HAWKES   Scrotal lesion    Short of breath on exertion     Assessment: Patient Reported Symptoms:  Cognitive Cognitive Status: Able to follow simple commands, Alert and oriented to person, place, and time, Normal speech and language skills      Neurological Neurological Review of Symptoms: No symptoms reported    HEENT HEENT Symptoms Reported: No symptoms reported      Cardiovascular Cardiovascular Symptoms Reported: No symptoms reported    Respiratory Respiratory Symptoms Reported: Shortness of breath Respiratory Conditions: Shortness of breath Respiratory Comment: use inhalers  Endocrine Patient reports the following symptoms related to hypoglycemia or hyperglycemia : No symptoms reported    Gastrointestinal Gastrointestinal Symptoms Reported: No symptoms reported      Genitourinary Genitourinary Symptoms Reported: No symptoms reported    Integumentary Integumentary Symptoms Reported: Other Other Integumentary Symptoms: Thin skin    Musculoskeletal     Falls in the past year?: No    Psychosocial       Quality of Family Relationships: supportive Do you feel physically threatened by others?: No      10/10/2023    2:45 PM  Depression screen PHQ 2/9  Decreased Interest 0  Down,  Depressed, Hopeless 0  PHQ - 2 Score 0    There were no vitals filed for this visit.  Medications Reviewed Today     Reviewed by Augustin Leber, RN (Registered Nurse) on 10/10/23 at 1439  Med List Status: <None>   Medication Order Taking? Sig Documenting Provider Last Dose Status Informant  apixaban  (ELIQUIS ) 5 MG TABS tablet [id number] Yes Take  1 tablet   2 x / day (every 12 hours)  for Afib to Prevent Blood Clots         /       TAKE      BY      MOUTH Vangie Genet, MD Taking Active Self, Family Member, Pharmacy Records, Multiple Informants           Med Note [place], [place] R   Tue Sep 11, 2023  1:58 PM)    Cholecalciferol  (VITAMIN D ) 125 MCG (5000 UT) CAPS [id number] Yes Take 5,000 Units by mouth as directed. [provider] Taking Active Self, Family Member, Pharmacy Records, Multiple Informants  finasteride  (PROSCAR ) 5 MG tablet [id number] Yes Take  1 tablet  Daily  for Prostate Vangie Genet, MD Taking Active Self, Family Member, Pharmacy Records, Multiple Informants           Med Note [place], [place] R   Tue Sep 11, 2023  1:58 PM)    furosemide  (LASIX ) 40 MG tablet [id number] Yes Take  1 tablet  Daily  for Fluid Retention / Edema  Patient taking differently: every  other day. Take  1 tablet  Daily  for Fluid Retention / Edema   Langley Pippin, NP Taking Active   magnesium  gluconate (MAGONATE) 500 MG tablet [id number] Yes Take 500 mg by mouth daily. [provider] Taking Active Self, Family Member, Pharmacy Records, Multiple Informants  olmesartan  (BENICAR ) 20 MG tablet [id number] Yes Take 0.5 tablets (10 mg total) by mouth daily. Debbie Fails, PA-C Taking Active Self, Family Member, Pharmacy Records, Multiple Informants           Med Note [place], [place] R   Tue Sep 11, 2023  1:58 PM)    OVER THE COUNTER MEDICATION [id number] Yes 1 capsule daily. Naturebell Zinc  Quercitin With Vitamin C and D3 (130 mg ) [provider] Taking Active Self, Family  Member, Pharmacy Records, Multiple Informants  predniSONE  (DELTASONE ) 5 MG tablet [id number] Yes Take 1 tablet by mouth twice a day or as directed for asthma and rheumatoid arthritis Vangie Genet, MD Taking Active Self, Family Member, Pharmacy Records, Multiple Informants           Med Note [place], [place] R   Tue Sep 11, 2023  1:58 PM)    rosuvastatin  (CRESTOR ) 20 MG tablet [id number] Yes Take 1 tablet by mouth daily for cholesterol  Patient taking differently: 3 (three) times a week. Take 1 tablet by mouth daily for cholesterol   Wilkinson, Dana E, FNP Taking Active Self, Family Member, Pharmacy Records, Multiple Informants  terazosin  (HYTRIN ) 10 MG capsule [id number] Yes Take  1 capsule  at Bedtime  for Prostate Vangie Genet, MD Taking Active Self, Family Member, Pharmacy Records, Multiple Informants  terbinafine  (LAMISIL ) 250 MG tablet [id number] Yes Take 250 mg by mouth as needed. [provider] Taking Active             Recommendation:   PCP Follow-up  Follow Up Plan:   Telephone follow up appointment with care management team member scheduled for:  11/09/23  245 pm    Augustin Leber RN, BSN, [facility]   [facility]  Care Coordinator Phone: [phone]

## 2023-11-09 ENCOUNTER — Other Ambulatory Visit: Payer: Self-pay

## 2023-11-09 NOTE — Patient Instructions (Signed)
 Visit Information  Thank you for taking time to visit with me today. Please don't hesitate to contact me if I can be of assistance to you before our next scheduled appointment.  Your next care management appointment is by telephone on 12/07/23 at pm   Please call the care guide team at [phone] if you need to cancel, schedule, or reschedule an appointment.   Please call 1-800-273-TALK (toll free, 24 hour hotline) go to [facility] Urgent [facility] [address] [place] [phone]) call 911 if you are experiencing a Mental Health or Behavioral Health Crisis or need someone to talk to.   [name] Diver RN, BSN, [facility] [place]  [facility], [facility]    Care Coordinator Phone: [phone]

## 2023-11-09 NOTE — Patient Outreach (Signed)
 Complex Care Management   Visit Note  11/09/2023  Name:  [name] MRN: [id number] DOB: July 13, 1930  Situation: Referral received for Complex Care Management related to Atrial Fibrillation I obtained verbal consent from Patient.  Visit completed with patient  on the phone  Background:   Past Medical History:  Diagnosis Date   Allergy    Asthma    BPH (benign prostatic hypertrophy)    BPH (benign prostatic hypertrophy)    COPD (chronic obstructive pulmonary disease) (HCC)    DJD (degenerative joint disease)    GERD (gastroesophageal reflux disease)    History of urinary retention    Hypertension    IBS (irritable bowel syndrome)    RBBB    Rheumatoid arthritis(714.0)    DR. HAWKES   Scrotal lesion    Short of breath on exertion     Assessment: Patient Reported Symptoms:  Cognitive Cognitive Status: Alert and oriented to person, place, and time, Able to follow simple commands, Normal speech and language skills      Neurological Neurological Review of Symptoms: No symptoms reported    HEENT HEENT Symptoms Reported: No symptoms reported      Cardiovascular Cardiovascular Symptoms Reported: No symptoms reported Does patient have uncontrolled Hypertension?: No    Respiratory Respiratory Symptoms Reported: No symptoms reported    Endocrine Patient reports the following symptoms related to hypoglycemia or hyperglycemia : No symptoms reported    Gastrointestinal Gastrointestinal Symptoms Reported: No symptoms reported      Genitourinary Genitourinary Symptoms Reported: No symptoms reported    Integumentary Integumentary Symptoms Reported: No symptoms reported    Musculoskeletal Musculoskelatal Symptoms Reviewed: Unsteady gait Musculoskeletal Conditions: Unsteady gait Musculoskeletal Comment: He does not use anything to help Falls in the past year?: No    Psychosocial       Quality of Family Relationships: involved, supportive Do you feel physically threatened  by others?: No      11/09/2023    3:27 PM  Depression screen PHQ 2/9  Decreased Interest 0  Down, Depressed, Hopeless 0  PHQ - 2 Score 0    There were no vitals filed for this visit.  Medications Reviewed Today     Reviewed by Weyman Corning, RN (Registered Nurse) on 11/09/23 at 1522  Med List Status: <None>   Medication Order Taking? Sig Documenting Provider Last Dose Status Informant  apixaban  (ELIQUIS ) 5 MG TABS tablet [id number] Yes Take  1 tablet   2 x / day (every 12 hours)  for Afib to Prevent Blood Clots         /       TAKE      BY      MOUTH Tonita Fallow, MD  Active Self, Family Member, Pharmacy Records, Multiple Informants           Med Note [place], [place] R   Tue Sep 11, 2023  1:58 PM)    Cholecalciferol  (VITAMIN D ) 125 MCG (5000 UT) CAPS [id number] Yes Take 5,000 Units by mouth as directed. [provider]  Active Self, Family Member, Pharmacy Records, Multiple Informants  finasteride  (PROSCAR ) 5 MG tablet [id number] Yes Take  1 tablet  Daily  for Prostate Tonita Fallow, MD  Active Self, Family Member, Pharmacy Records, Multiple Informants           Med Note [place], [place] R   Tue Sep 11, 2023  1:58 PM)    furosemide  (LASIX ) 40 MG tablet [id number] Yes Take  1 tablet  Daily  for Fluid Retention / Edema Cranford, Tonya, NP  Active   magnesium  gluconate (MAGONATE) 500 MG tablet [id number] Yes Take 500 mg by mouth daily. [provider]  Active Self, Family Member, Pharmacy Records, Multiple Informants  olmesartan  (BENICAR ) 20 MG tablet [id number] Yes Take 0.5 tablets (10 mg total) by mouth daily. Leverne Charlies Helling, PA-C  Active Self, Family Member, Pharmacy Records, Multiple Informants           Med Note [place], [place] R   Tue Sep 11, 2023  1:58 PM)    OVER THE COUNTER MEDICATION [id number]  1 capsule daily. Naturebell Zinc  Quercitin With Vitamin C and D3 (130 mg ) [provider]  Active Self, Family Member, Pharmacy Records, Multiple Informants   predniSONE  (DELTASONE ) 5 MG tablet [id number] Yes Take 1 tablet by mouth twice a day or as directed for asthma and rheumatoid arthritis Tonita Fallow, MD  Active Self, Family Member, Pharmacy Records, Multiple Informants           Med Note [place], [place] R   Tue Sep 11, 2023  1:58 PM)    rosuvastatin  (CRESTOR ) 20 MG tablet [id number] Yes Take 1 tablet by mouth daily for cholesterol Wilkinson, Dana E, FNP  Active Self, Family Member, Pharmacy Records, Multiple Informants  terazosin  (HYTRIN ) 10 MG capsule [id number] Yes Take  1 capsule  at Bedtime  for Prostate Tonita Fallow, MD  Active Self, Family Member, Pharmacy Records, Multiple Informants  terbinafine  (LAMISIL ) 250 MG tablet [id number] Yes Take 250 mg by mouth as needed. [provider]  Active             Recommendation:   PCP Follow-up  Follow Up Plan:   Telephone follow up appointment date/time:  12/07/23  3 pm  Wilbert Diver RN, BSN, [facility]  [facility], [facility]    Care Coordinator Phone: [phone]

## 2023-11-14 NOTE — Progress Notes (Signed)
 Remote pacemaker transmission.

## 2023-11-14 NOTE — Addendum Note (Signed)
 Addended by: TAWNI DRILLING D on: 11/14/2023 12:14 PM   Modules accepted: Orders

## 2023-11-19 ENCOUNTER — Ambulatory Visit: Payer: PPO | Admitting: Internal Medicine

## 2023-11-27 ENCOUNTER — Other Ambulatory Visit: Payer: Self-pay

## 2023-11-27 NOTE — Patient Outreach (Signed)
 Complex Care Management   Visit Note  11/27/2023  Name:  [name] MRN: [id number] DOB: Jul 02, 1930  Situation: Referral received for Complex Care Management related to Atrial Fibrillation I obtained verbal consent from Patient.  Visit completed with Corinthia Neighbors  on the phone  Background:   Past Medical History:  Diagnosis Date   Allergy    Asthma    BPH (benign prostatic hypertrophy)    BPH (benign prostatic hypertrophy)    COPD (chronic obstructive pulmonary disease) (HCC)    DJD (degenerative joint disease)    GERD (gastroesophageal reflux disease)    History of urinary retention    Hypertension    IBS (irritable bowel syndrome)    RBBB    Rheumatoid arthritis(714.0)    DR. HAWKES   Scrotal lesion    Short of breath on exertion     Assessment: Patient Reported Symptoms:  Cognitive Cognitive Status: Able to follow simple commands, Alert and oriented to person, place, and time, Normal speech and language skills Cognitive/Intellectual Conditions Management [RPT]: None reported or documented in medical history or problem list      Neurological Neurological Review of Symptoms: No symptoms reported    HEENT HEENT Symptoms Reported: No symptoms reported      Cardiovascular Cardiovascular Symptoms Reported: No symptoms reported Does patient have uncontrolled Hypertension?: No (About 100/50 per patient) Cardiovascular Management Strategies: Medication therapy, Medical device (Pacemaker)  Respiratory Respiratory Symptoms Reported: No symptoms reported    Endocrine Endocrine Symptoms Reported: No symptoms reported    Gastrointestinal Gastrointestinal Symptoms Reported: No symptoms reported Additional Gastrointestinal Details: Last BM yesterday      Genitourinary Genitourinary Symptoms Reported: No symptoms reported    Integumentary Integumentary Symptoms Reported: No symptoms reported    Musculoskeletal Musculoskelatal Symptoms Reviewed: Unsteady  gait Musculoskeletal Management Strategies: Coping strategies, Adequate rest Musculoskeletal Comment: Patient does not use any assistive devices. Denies falls in the last year. Does not do any regular exercise. Falls in the past year?: No Number of falls in past year: 1 or less Was there an injury with Fall?: No Fall Risk Category Calculator: 0 Patient Fall Risk Level: Low Fall Risk Patient at Risk for Falls Due to: No Fall Risks Fall risk Follow up: Falls evaluation completed, Education provided, Falls prevention discussed  Psychosocial Psychosocial Symptoms Reported: No symptoms reported            11/09/2023    3:27 PM  Depression screen PHQ 2/9  Decreased Interest 0  Down, Depressed, Hopeless 0  PHQ - 2 Score 0    There were no vitals filed for this visit.  Medications Reviewed Today     Reviewed by Arno Rosaline SQUIBB, RN (Registered Nurse) on 11/27/23 at 1548  Med List Status: <None>   Medication Order Taking? Sig Documenting Provider Last Dose Status Informant  apixaban  (ELIQUIS ) 5 MG TABS tablet [id number] Yes Take  1 tablet   2 x / day (every 12 hours)  for Afib to Prevent Blood Clots         /       TAKE      BY      MOUTH Tonita Fallow, MD  Active Self, Family Member, Pharmacy Records, Multiple Informants           Med Note [place], [place] R   Tue Sep 11, 2023  1:58 PM)    Cholecalciferol  (VITAMIN D ) 125 MCG (5000 UT) CAPS [id number] Yes Take 5,000 Units by mouth as directed. [provider]  Active Self, Family Member, Pharmacy Records, Multiple Informants  finasteride  (PROSCAR ) 5 MG tablet [id number]  Take  1 tablet  Daily  for Prostate Tonita Fallow, MD  Active Self, Family Member, Pharmacy Records, Multiple Informants           Med Note [place], [place] R   Tue Sep 11, 2023  1:58 PM)    furosemide  (LASIX ) 40 MG tablet [id number]  Take  1 tablet  Daily  for Fluid Retention / Edema Cranford, Tonya, NP  Active   magnesium  gluconate (MAGONATE) 500 MG  tablet [id number]  Take 500 mg by mouth daily. [provider]  Active Self, Family Member, Pharmacy Records, Multiple Informants  olmesartan  (BENICAR ) 20 MG tablet [id number]  Take 0.5 tablets (10 mg total) by mouth daily. Leverne Charlies Helling, PA-C  Active Self, Family Member, Pharmacy Records, Multiple Informants           Med Note [place], [place] R   Tue Sep 11, 2023  1:58 PM)    OVER THE COUNTER MEDICATION [id number]  1 capsule daily. Naturebell Zinc  Quercitin With Vitamin C and D3 (130 mg ) [provider]  Active Self, Family Member, Pharmacy Records, Multiple Informants  predniSONE  (DELTASONE ) 5 MG tablet [id number]  Take 1 tablet by mouth twice a day or as directed for asthma and rheumatoid arthritis Tonita Fallow, MD  Active Self, Family Member, Pharmacy Records, Multiple Informants           Med Note [place], [place] R   Tue Sep 11, 2023  1:58 PM)    rosuvastatin  (CRESTOR ) 20 MG tablet [id number]  Take 1 tablet by mouth daily for cholesterol Wilkinson, Dana E, FNP  Active Self, Family Member, Pharmacy Records, Multiple Informants  terazosin  (HYTRIN ) 10 MG capsule [id number]  Take  1 capsule  at Bedtime  for Prostate Tonita Fallow, MD  Active Self, Family Member, Pharmacy Records, Multiple Informants  terbinafine  (LAMISIL ) 250 MG tablet [id number]  Take 250 mg by mouth as needed. [provider]  Active             Recommendation:   Continue Current Plan of Care  Follow Up Plan:   Closing From:  Complex Care Management Patient has met all care management goals. Care Management case will be closed. Patient has been provided contact information should new needs arise.   Rosaline Finlay, RN MSN [place]  [facility] RN Care Manager Direct Dial: [phone]  Fax: [phone]

## 2023-11-27 NOTE — Patient Instructions (Signed)
 Visit Information  Thank you for taking time to visit with me today. Please don't hesitate to contact me if I can be of assistance to you before our next scheduled appointment.  Your next care management appointment is no further scheduled appointments.    Closing From: Complex Care Management. Patient has met all care management goals. Care Management case will be closed. Patient has been provided contact information should new needs arise.   Please call the care guide team at [phone] if you need to cancel, schedule, or reschedule an appointment.   Please call the Suicide and Crisis Lifeline: 988 call 1-800-273-TALK (toll free, 24 hour hotline) if you are experiencing a Mental Health or Behavioral Health Crisis or need someone to talk to.  Rosaline Finlay, RN MSN [place]  [facility] Population Health RN Care Manager Direct Dial: [phone]  Fax: [phone]

## 2023-12-05 ENCOUNTER — Ambulatory Visit: Payer: PPO | Admitting: Nurse Practitioner

## 2023-12-07 ENCOUNTER — Telehealth

## 2023-12-12 ENCOUNTER — Ambulatory Visit: Payer: PPO | Admitting: Nurse Practitioner

## 2023-12-13 DIAGNOSIS — L821 Other seborrheic keratosis: Secondary | ICD-10-CM | POA: Diagnosis not present

## 2023-12-13 DIAGNOSIS — D485 Neoplasm of uncertain behavior of skin: Secondary | ICD-10-CM | POA: Diagnosis not present

## 2023-12-13 DIAGNOSIS — Z85828 Personal history of other malignant neoplasm of skin: Secondary | ICD-10-CM | POA: Diagnosis not present

## 2023-12-13 DIAGNOSIS — L820 Inflamed seborrheic keratosis: Secondary | ICD-10-CM | POA: Diagnosis not present

## 2023-12-13 DIAGNOSIS — D1801 Hemangioma of skin and subcutaneous tissue: Secondary | ICD-10-CM | POA: Diagnosis not present

## 2023-12-13 DIAGNOSIS — L570 Actinic keratosis: Secondary | ICD-10-CM | POA: Diagnosis not present

## 2023-12-13 DIAGNOSIS — D225 Melanocytic nevi of trunk: Secondary | ICD-10-CM | POA: Diagnosis not present

## 2023-12-13 DIAGNOSIS — C44311 Basal cell carcinoma of skin of nose: Secondary | ICD-10-CM | POA: Diagnosis not present

## 2023-12-13 DIAGNOSIS — L814 Other melanin hyperpigmentation: Secondary | ICD-10-CM | POA: Diagnosis not present

## 2023-12-14 ENCOUNTER — Ambulatory Visit

## 2023-12-14 IMAGING — DX DG KNEE 1-2V*L*
2 series · 2 of 2 positions shown · non-contrast
Comparison: None.

CLINICAL DATA: Left knee pain

EXAM:
LEFT KNEE - 1-2 VIEW

[knee ap]
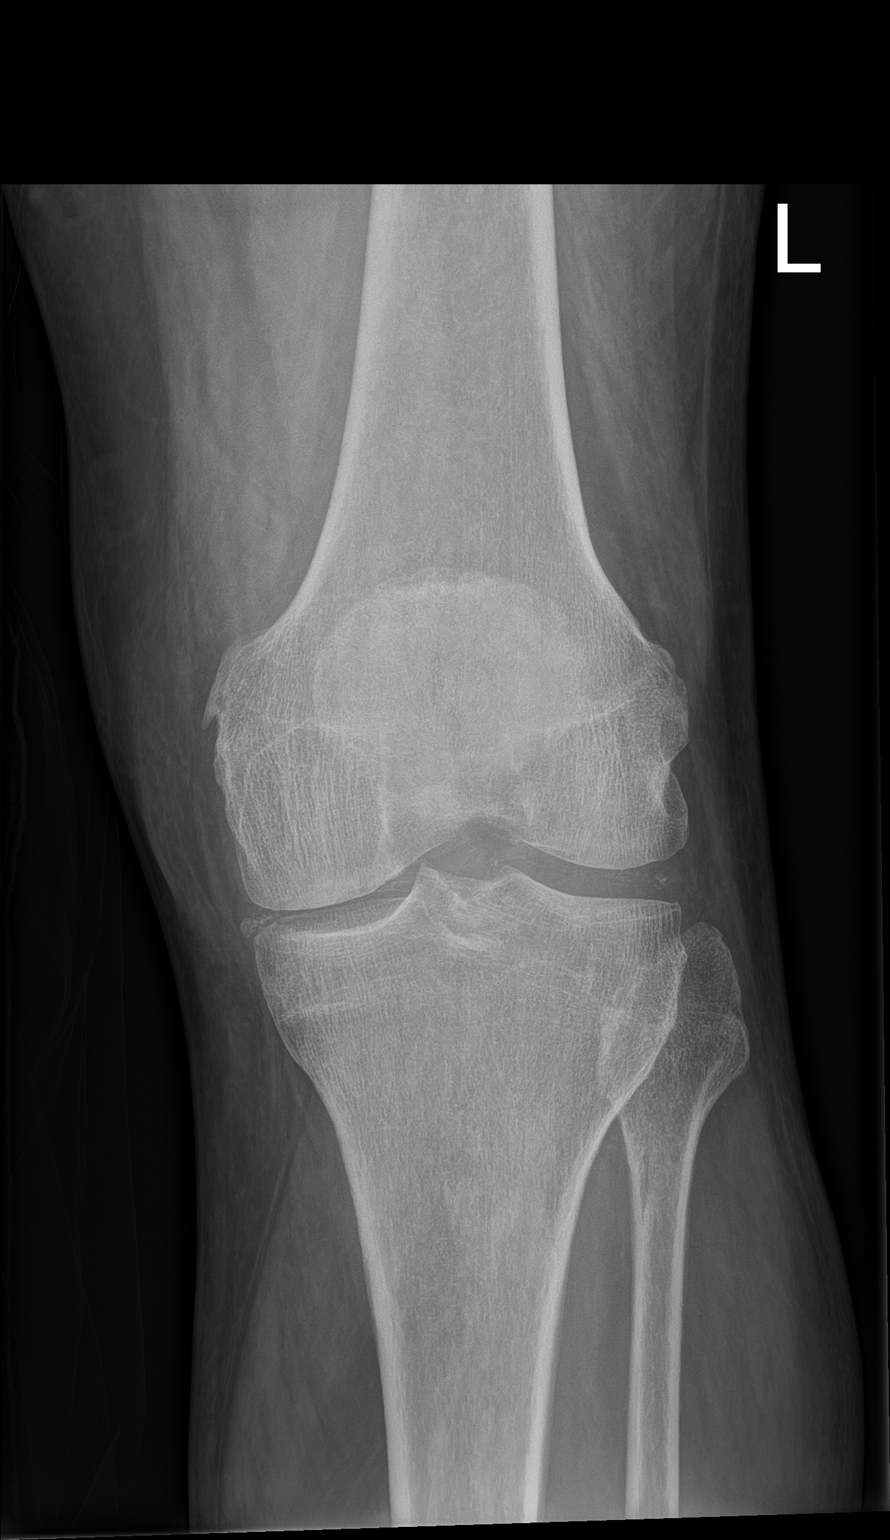

[knee lat]
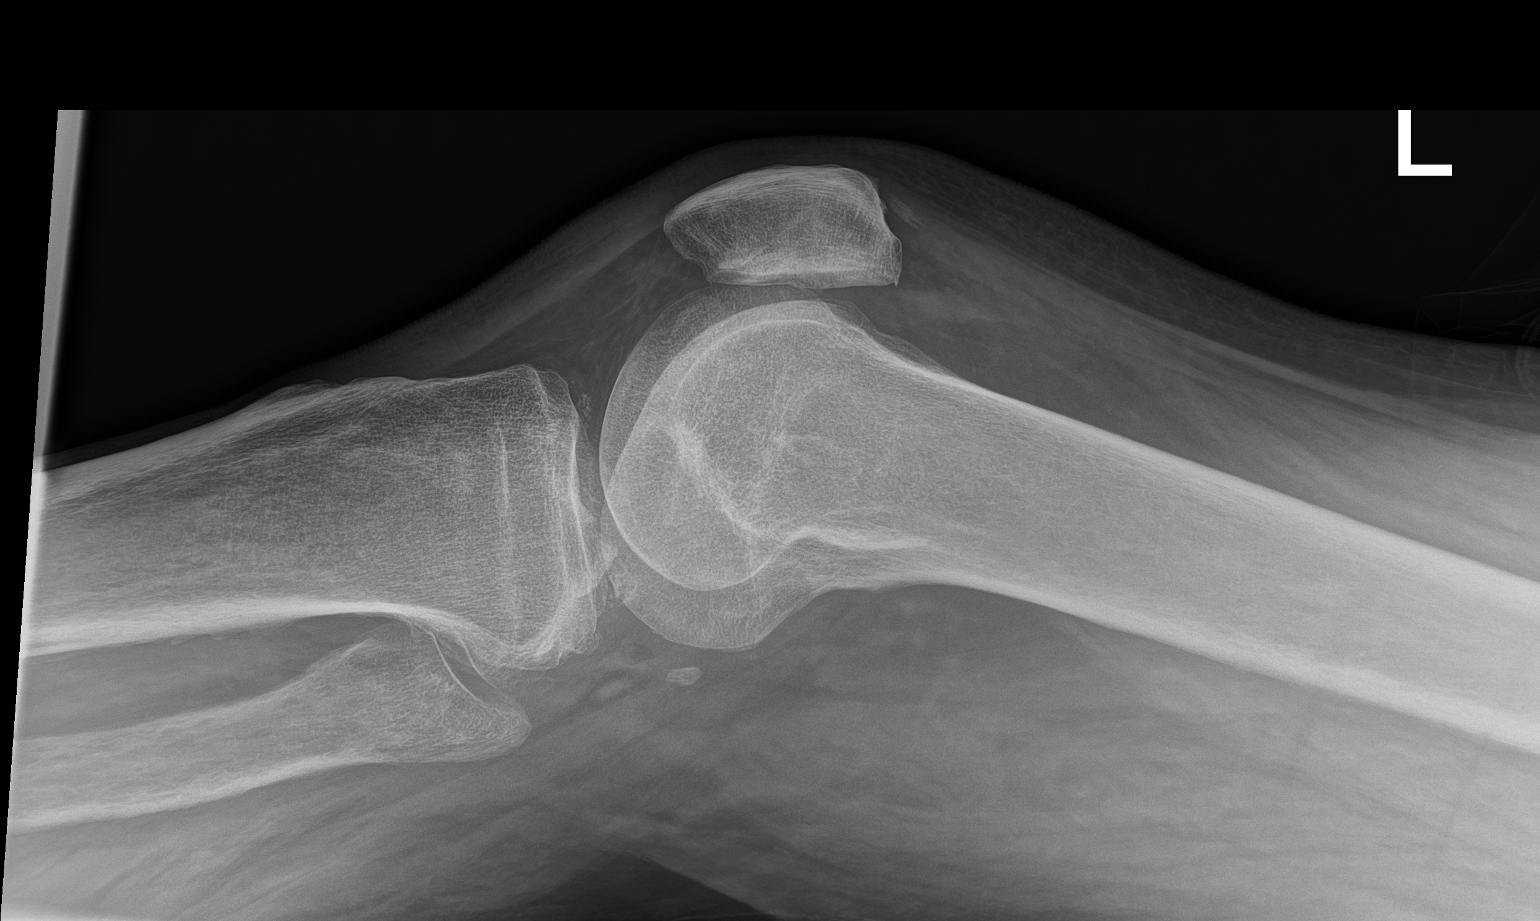

[2 of 2 positions shown; findings below may reference images not displayed]

FINDINGS: There is no evidence of acute fracture or dislocation. Trace joint
effusion. There is tricompartment osteophyte formation and
chondrocalcinosis. Suprapatellar enthesophyte formation.
IMPRESSION: No acute fracture.  Trace joint effusion.

Mild Tricompartment osteoarthritis and/or CPPD arthropathy.

## 2023-12-26 LAB — CUP PACEART REMOTE DEVICE CHECK
Battery Remaining Longevity: 90 mo
Battery Voltage: 3 V
Brady Statistic AP VP Percent: 17.5 %
Brady Statistic AP VS Percent: 0.02 %
Brady Statistic AS VP Percent: 81.39 %
Brady Statistic AS VS Percent: 1.1 %
Brady Statistic RA Percent Paced: 17.62 %
Brady Statistic RV Percent Paced: 98.85 %
Date Time Interrogation Session: 20250801055543
Implantable Lead Connection Status: 753985
Implantable Lead Connection Status: 753985
Implantable Lead Implant Date: 20230219
Implantable Lead Implant Date: 20230219
Implantable Lead Location: 753859
Implantable Lead Location: 753860
Implantable Lead Model: 3830
Implantable Lead Model: 5076
Implantable Pulse Generator Implant Date: 20230219
Lead Channel Impedance Value: 323 Ohm
Lead Channel Impedance Value: 361 Ohm
Lead Channel Impedance Value: 361 Ohm
Lead Channel Impedance Value: 475 Ohm
Lead Channel Pacing Threshold Amplitude: 0.5 V
Lead Channel Pacing Threshold Amplitude: 1.5 V
Lead Channel Pacing Threshold Pulse Width: 0.4 ms
Lead Channel Pacing Threshold Pulse Width: 0.4 ms
Lead Channel Sensing Intrinsic Amplitude: 15.625 mV
Lead Channel Sensing Intrinsic Amplitude: 15.625 mV
Lead Channel Sensing Intrinsic Amplitude: 3.375 mV
Lead Channel Sensing Intrinsic Amplitude: 3.375 mV
Lead Channel Setting Pacing Amplitude: 1.5 V
Lead Channel Setting Pacing Amplitude: 3.25 V
Lead Channel Setting Pacing Pulse Width: 0.4 ms
Lead Channel Setting Sensing Sensitivity: 1.2 mV
Zone Setting Status: 755011

## 2023-12-31 ENCOUNTER — Ambulatory Visit (INDEPENDENT_AMBULATORY_CARE_PROVIDER_SITE_OTHER): Payer: PPO

## 2023-12-31 DIAGNOSIS — I442 Atrioventricular block, complete: Secondary | ICD-10-CM | POA: Diagnosis not present

## 2024-01-02 LAB — CUP PACEART REMOTE DEVICE CHECK
Battery Remaining Longevity: 92 mo
Battery Voltage: 2.99 V
Brady Statistic AP VP Percent: 21.79 %
Brady Statistic AP VS Percent: 0.02 %
Brady Statistic AS VP Percent: 77.16 %
Brady Statistic AS VS Percent: 1.04 %
Brady Statistic RA Percent Paced: 21.82 %
Brady Statistic RV Percent Paced: 98.95 %
Date Time Interrogation Session: 20250818054044
Implantable Lead Connection Status: 753985
Implantable Lead Connection Status: 753985
Implantable Lead Implant Date: 20230219
Implantable Lead Implant Date: 20230219
Implantable Lead Location: 753859
Implantable Lead Location: 753860
Implantable Lead Model: 3830
Implantable Lead Model: 5076
Implantable Pulse Generator Implant Date: 20230219
Lead Channel Impedance Value: 304 Ohm
Lead Channel Impedance Value: 342 Ohm
Lead Channel Impedance Value: 361 Ohm
Lead Channel Impedance Value: 456 Ohm
Lead Channel Pacing Threshold Amplitude: 0.5 V
Lead Channel Pacing Threshold Amplitude: 1.125 V
Lead Channel Pacing Threshold Pulse Width: 0.4 ms
Lead Channel Pacing Threshold Pulse Width: 0.4 ms
Lead Channel Sensing Intrinsic Amplitude: 11.875 mV
Lead Channel Sensing Intrinsic Amplitude: 11.875 mV
Lead Channel Sensing Intrinsic Amplitude: 4.75 mV
Lead Channel Sensing Intrinsic Amplitude: 4.75 mV
Lead Channel Setting Pacing Amplitude: 1.5 V
Lead Channel Setting Pacing Amplitude: 3 V
Lead Channel Setting Pacing Pulse Width: 0.4 ms
Lead Channel Setting Sensing Sensitivity: 1.2 mV
Zone Setting Status: 755011

## 2024-01-03 ENCOUNTER — Ambulatory Visit: Payer: Self-pay | Admitting: Cardiology

## 2024-01-29 DIAGNOSIS — N1831 Chronic kidney disease, stage 3a: Secondary | ICD-10-CM | POA: Diagnosis not present

## 2024-01-29 DIAGNOSIS — I129 Hypertensive chronic kidney disease with stage 1 through stage 4 chronic kidney disease, or unspecified chronic kidney disease: Secondary | ICD-10-CM | POA: Diagnosis not present

## 2024-01-29 DIAGNOSIS — Z125 Encounter for screening for malignant neoplasm of prostate: Secondary | ICD-10-CM | POA: Diagnosis not present

## 2024-01-29 DIAGNOSIS — Z0189 Encounter for other specified special examinations: Secondary | ICD-10-CM | POA: Diagnosis not present

## 2024-01-29 DIAGNOSIS — E7800 Pure hypercholesterolemia, unspecified: Secondary | ICD-10-CM | POA: Diagnosis not present

## 2024-01-30 DIAGNOSIS — C4401 Basal cell carcinoma of skin of lip: Secondary | ICD-10-CM | POA: Diagnosis not present

## 2024-01-30 DIAGNOSIS — Z85828 Personal history of other malignant neoplasm of skin: Secondary | ICD-10-CM | POA: Diagnosis not present

## 2024-02-05 DIAGNOSIS — J449 Chronic obstructive pulmonary disease, unspecified: Secondary | ICD-10-CM | POA: Diagnosis not present

## 2024-02-05 DIAGNOSIS — C4330 Malignant melanoma of unspecified part of face: Secondary | ICD-10-CM | POA: Diagnosis not present

## 2024-02-05 DIAGNOSIS — E2749 Other adrenocortical insufficiency: Secondary | ICD-10-CM | POA: Diagnosis not present

## 2024-02-05 DIAGNOSIS — Z7901 Long term (current) use of anticoagulants: Secondary | ICD-10-CM | POA: Diagnosis not present

## 2024-02-05 DIAGNOSIS — I10 Essential (primary) hypertension: Secondary | ICD-10-CM | POA: Diagnosis not present

## 2024-02-05 DIAGNOSIS — M069 Rheumatoid arthritis, unspecified: Secondary | ICD-10-CM | POA: Diagnosis not present

## 2024-02-05 DIAGNOSIS — Z23 Encounter for immunization: Secondary | ICD-10-CM | POA: Diagnosis not present

## 2024-02-05 DIAGNOSIS — E7800 Pure hypercholesterolemia, unspecified: Secondary | ICD-10-CM | POA: Diagnosis not present

## 2024-02-05 DIAGNOSIS — R82998 Other abnormal findings in urine: Secondary | ICD-10-CM | POA: Diagnosis not present

## 2024-02-05 DIAGNOSIS — I4891 Unspecified atrial fibrillation: Secondary | ICD-10-CM | POA: Diagnosis not present

## 2024-02-05 DIAGNOSIS — Z0000 Encounter for general adult medical examination without abnormal findings: Secondary | ICD-10-CM | POA: Diagnosis not present

## 2024-02-05 DIAGNOSIS — Z950 Presence of cardiac pacemaker: Secondary | ICD-10-CM | POA: Diagnosis not present

## 2024-02-06 NOTE — Progress Notes (Signed)
 Remote PPM Transmission

## 2024-02-20 ENCOUNTER — Ambulatory Visit: Payer: PPO | Admitting: Nurse Practitioner

## 2024-03-04 DIAGNOSIS — H02535 Eyelid retraction left lower eyelid: Secondary | ICD-10-CM | POA: Diagnosis not present

## 2024-03-04 DIAGNOSIS — H02532 Eyelid retraction right lower eyelid: Secondary | ICD-10-CM | POA: Diagnosis not present

## 2024-03-04 DIAGNOSIS — H04123 Dry eye syndrome of bilateral lacrimal glands: Secondary | ICD-10-CM | POA: Diagnosis not present

## 2024-03-04 DIAGNOSIS — H0102B Squamous blepharitis left eye, upper and lower eyelids: Secondary | ICD-10-CM | POA: Diagnosis not present

## 2024-03-04 DIAGNOSIS — H0102A Squamous blepharitis right eye, upper and lower eyelids: Secondary | ICD-10-CM | POA: Diagnosis not present

## 2024-03-31 ENCOUNTER — Ambulatory Visit (INDEPENDENT_AMBULATORY_CARE_PROVIDER_SITE_OTHER): Payer: PPO

## 2024-03-31 DIAGNOSIS — I442 Atrioventricular block, complete: Secondary | ICD-10-CM | POA: Diagnosis not present

## 2024-03-31 LAB — CUP PACEART REMOTE DEVICE CHECK
Battery Remaining Longevity: 94 mo
Battery Voltage: 2.99 V
Brady Statistic AP VP Percent: 16.47 %
Brady Statistic AP VS Percent: 0.02 %
Brady Statistic AS VP Percent: 82.35 %
Brady Statistic AS VS Percent: 1.15 %
Brady Statistic RA Percent Paced: 16.73 %
Brady Statistic RV Percent Paced: 98.83 %
Date Time Interrogation Session: 20251116195531
Implantable Lead Connection Status: 753985
Implantable Lead Connection Status: 753985
Implantable Lead Implant Date: 20230219
Implantable Lead Implant Date: 20230219
Implantable Lead Location: 753859
Implantable Lead Location: 753860
Implantable Lead Model: 3830
Implantable Lead Model: 5076
Implantable Pulse Generator Implant Date: 20230219
Lead Channel Impedance Value: 304 Ohm
Lead Channel Impedance Value: 342 Ohm
Lead Channel Impedance Value: 342 Ohm
Lead Channel Impedance Value: 456 Ohm
Lead Channel Pacing Threshold Amplitude: 0.625 V
Lead Channel Pacing Threshold Amplitude: 1.375 V
Lead Channel Pacing Threshold Pulse Width: 0.4 ms
Lead Channel Pacing Threshold Pulse Width: 0.4 ms
Lead Channel Sensing Intrinsic Amplitude: 13 mV
Lead Channel Sensing Intrinsic Amplitude: 13 mV
Lead Channel Sensing Intrinsic Amplitude: 3.375 mV
Lead Channel Sensing Intrinsic Amplitude: 3.375 mV
Lead Channel Setting Pacing Amplitude: 1.5 V
Lead Channel Setting Pacing Amplitude: 2.75 V
Lead Channel Setting Pacing Pulse Width: 0.4 ms
Lead Channel Setting Sensing Sensitivity: 1.2 mV
Zone Setting Status: 755011

## 2024-04-01 ENCOUNTER — Ambulatory Visit: Payer: Self-pay | Admitting: Cardiology

## 2024-04-02 NOTE — Progress Notes (Signed)
 Remote PPM Transmission

## 2024-04-14 DIAGNOSIS — C44319 Basal cell carcinoma of skin of other parts of face: Secondary | ICD-10-CM | POA: Diagnosis not present

## 2024-04-14 DIAGNOSIS — L570 Actinic keratosis: Secondary | ICD-10-CM | POA: Diagnosis not present

## 2024-04-14 DIAGNOSIS — D1801 Hemangioma of skin and subcutaneous tissue: Secondary | ICD-10-CM | POA: Diagnosis not present

## 2024-04-14 DIAGNOSIS — D692 Other nonthrombocytopenic purpura: Secondary | ICD-10-CM | POA: Diagnosis not present

## 2024-04-14 DIAGNOSIS — L821 Other seborrheic keratosis: Secondary | ICD-10-CM | POA: Diagnosis not present

## 2024-04-14 DIAGNOSIS — Z85828 Personal history of other malignant neoplasm of skin: Secondary | ICD-10-CM | POA: Diagnosis not present

## 2024-05-29 ENCOUNTER — Encounter: Payer: PPO | Admitting: Internal Medicine

## 2024-06-30 ENCOUNTER — Ambulatory Visit: Payer: PPO

## 2024-09-29 ENCOUNTER — Ambulatory Visit: Payer: PPO

## 2024-12-29 ENCOUNTER — Ambulatory Visit: Payer: PPO

## 2025-03-30 ENCOUNTER — Ambulatory Visit: Payer: PPO

## 2025-06-29 ENCOUNTER — Ambulatory Visit: Payer: PPO

## 2025-09-28 ENCOUNTER — Ambulatory Visit: Payer: PPO
